# Patient Record
Sex: Female | Born: 1955 | Race: White | Hispanic: No | Marital: Married | Smoking: Never smoker
Health system: Southern US, Community
[De-identification: ages and names within clinical notes are randomized; demographics above are authoritative.]

## PROBLEM LIST (undated history)

## (undated) DIAGNOSIS — Z9225 Personal history of immunosupression therapy: Secondary | ICD-10-CM

## (undated) DIAGNOSIS — R0609 Other forms of dyspnea: Secondary | ICD-10-CM

## (undated) DIAGNOSIS — M2011 Hallux valgus (acquired), right foot: Secondary | ICD-10-CM

## (undated) DIAGNOSIS — E559 Vitamin D deficiency, unspecified: Secondary | ICD-10-CM

## (undated) DIAGNOSIS — I10 Essential (primary) hypertension: Secondary | ICD-10-CM

## (undated) DIAGNOSIS — M722 Plantar fascial fibromatosis: Secondary | ICD-10-CM

## (undated) DIAGNOSIS — R0789 Other chest pain: Secondary | ICD-10-CM

## (undated) DIAGNOSIS — F419 Anxiety disorder, unspecified: Secondary | ICD-10-CM

## (undated) DIAGNOSIS — I493 Ventricular premature depolarization: Secondary | ICD-10-CM

## (undated) DIAGNOSIS — M2012 Hallux valgus (acquired), left foot: Secondary | ICD-10-CM

## (undated) DIAGNOSIS — N200 Calculus of kidney: Secondary | ICD-10-CM

## (undated) DIAGNOSIS — M069 Rheumatoid arthritis, unspecified: Secondary | ICD-10-CM

## (undated) DIAGNOSIS — M1990 Unspecified osteoarthritis, unspecified site: Secondary | ICD-10-CM

## (undated) DIAGNOSIS — R0600 Dyspnea, unspecified: Secondary | ICD-10-CM

## (undated) DIAGNOSIS — M19072 Primary osteoarthritis, left ankle and foot: Secondary | ICD-10-CM

## (undated) DIAGNOSIS — E785 Hyperlipidemia, unspecified: Secondary | ICD-10-CM

## (undated) DIAGNOSIS — M1712 Unilateral primary osteoarthritis, left knee: Secondary | ICD-10-CM

## (undated) HISTORY — DX: Calculus of kidney: N20.0

## (undated) HISTORY — DX: Plantar fascial fibromatosis: M72.2

## (undated) HISTORY — DX: Vitamin D deficiency, unspecified: E55.9

## (undated) HISTORY — PX: GALLBLADDER SURGERY: SHX652

## (undated) HISTORY — DX: Primary osteoarthritis, left ankle and foot: M19.072

## (undated) HISTORY — DX: Unspecified osteoarthritis, unspecified site: M19.90

## (undated) HISTORY — DX: Ventricular premature depolarization: I49.3

## (undated) HISTORY — PX: OTHER SURGICAL HISTORY: SHX169

## (undated) HISTORY — DX: Unilateral primary osteoarthritis, left knee: M17.12

## (undated) HISTORY — PX: CATARACT EXTRACTION, BILATERAL: SHX1313

---

## 2004-12-02 HISTORY — PX: CHOLECYSTECTOMY: SHX55

## 2004-12-02 HISTORY — PX: CARDIAC CATHETERIZATION: SHX172

## 2018-11-10 ENCOUNTER — Telehealth: Payer: Self-pay

## 2018-11-10 NOTE — Telephone Encounter (Signed)
-----   Message from Wellington Hampshire, MD sent at 11/10/2018  9:43 AM EST ----- Regarding: RE: New patient  I can see her next tuesday at 1:40.   ----- Message ----- From: Clarisse Gouge Sent: 11/10/2018   8:59 AM EST To: Wellington Hampshire, MD Subject: New patient                                    Jeno Pasti's wife wants an asap new patient appt with you only declines to go to ED as they are  incompetent (she chose more colorful words) for chest discomfort and interim tach 110's .  She says you will work her in :-) and declined February appt.  Any suggestions?  Thanks   [employer]

## 2018-11-10 NOTE — Telephone Encounter (Signed)
lmov to confirm appt time.    

## 2018-11-17 ENCOUNTER — Ambulatory Visit: Payer: Self-pay | Admitting: Cardiovascular Disease

## 2018-11-23 ENCOUNTER — Encounter: Payer: Self-pay | Admitting: Emergency Medicine

## 2018-11-23 ENCOUNTER — Other Ambulatory Visit: Payer: Self-pay

## 2018-11-23 ENCOUNTER — Emergency Department: Payer: Self-pay

## 2018-11-23 ENCOUNTER — Emergency Department
Admission: EM | Admit: 2018-11-23 | Discharge: 2018-11-23 | Disposition: A | Discharge status: Home / Self Care | Payer: Self-pay | Attending: Emergency Medicine | Admitting: Emergency Medicine

## 2018-11-23 DIAGNOSIS — R110 Nausea: Secondary | ICD-10-CM | POA: Insufficient documentation

## 2018-11-23 DIAGNOSIS — Y92512 Supermarket, store or market as the place of occurrence of the external cause: Secondary | ICD-10-CM | POA: Insufficient documentation

## 2018-11-23 DIAGNOSIS — Y9301 Activity, walking, marching and hiking: Secondary | ICD-10-CM | POA: Insufficient documentation

## 2018-11-23 DIAGNOSIS — I10 Essential (primary) hypertension: Secondary | ICD-10-CM | POA: Insufficient documentation

## 2018-11-23 DIAGNOSIS — S20212A Contusion of left front wall of thorax, initial encounter: Secondary | ICD-10-CM | POA: Insufficient documentation

## 2018-11-23 DIAGNOSIS — W19XXXA Unspecified fall, initial encounter: Secondary | ICD-10-CM

## 2018-11-23 DIAGNOSIS — Z79899 Other long term (current) drug therapy: Secondary | ICD-10-CM | POA: Insufficient documentation

## 2018-11-23 DIAGNOSIS — S7002XA Contusion of left hip, initial encounter: Secondary | ICD-10-CM | POA: Insufficient documentation

## 2018-11-23 DIAGNOSIS — Y998 Other external cause status: Secondary | ICD-10-CM | POA: Insufficient documentation

## 2018-11-23 DIAGNOSIS — R51 Headache: Secondary | ICD-10-CM | POA: Insufficient documentation

## 2018-11-23 DIAGNOSIS — S8002XA Contusion of left knee, initial encounter: Secondary | ICD-10-CM | POA: Insufficient documentation

## 2018-11-23 DIAGNOSIS — W01198A Fall on same level from slipping, tripping and stumbling with subsequent striking against other object, initial encounter: Secondary | ICD-10-CM | POA: Insufficient documentation

## 2018-11-23 HISTORY — DX: Essential (primary) hypertension: I10

## 2018-11-23 MED ORDER — MELOXICAM 15 MG PO TABS: 15.0000 mg | ORAL_TABLET | Freq: Every day | ORAL | 0 refills | Status: DC

## 2018-11-23 MED ORDER — TRAMADOL HCL 50 MG PO TABS: 50.0000 mg | ORAL_TABLET | Freq: Four times a day (QID) | ORAL | 0 refills | Status: DC | PRN

## 2018-11-23 MED ORDER — METHOCARBAMOL 500 MG PO TABS: 500.0000 mg | ORAL_TABLET | Freq: Four times a day (QID) | ORAL | 0 refills | Status: DC

## 2018-11-23 NOTE — ED Triage Notes (Signed)
Pt here with c/o pain on her left back, left leg and left arm. States she fell when walking into [employer], slipped on the wet floor as she was walking in, walked to triage with limp, appears in NAD.

## 2018-11-23 NOTE — ED Triage Notes (Signed)
Pain worsens with movement.

## 2018-11-23 NOTE — ED Provider Notes (Signed)
[facility] Emergency Department Provider Note  ____________________________________________  Time seen: Approximately 6:00 PM  I have reviewed the triage vital signs and the nursing notes.   HISTORY  Chief Complaint No chief complaint on file.    HPI Cindy Stein is a 62 y.o. female who presents the emergency department with multiple complaints after slipping and falling.  Patient was at [facility], transition from the outside which was red, into concrete.  Patient slipped and fell landing on her left side.  Patient reports that she did strike her head but did not lose consciousness.  Initially, she did not think "anything was wrong."  However, she has developed a headache, some nausea, left rib pain, left hip pain, left knee pain.  No medications    Past Medical History:  Diagnosis Date  . Hypertension     There are no active problems to display for this patient.   History reviewed. No pertinent surgical history.  Prior to Admission medications   Medication Sig Start Date End Date Taking? Authorizing Provider  carvedilol (COREG) 6.25 MG tablet Take 6.25 mg by mouth 2 (two) times daily with a meal.   Yes [provider]  olmesartan (BENICAR) 20 MG tablet Take 20 mg by mouth daily.   Yes [provider]  meloxicam (MOBIC) 15 MG tablet Take 1 tablet (15 mg total) by mouth daily. 11/23/18   , Charline Bills, PA-C  methocarbamol (ROBAXIN) 500 MG tablet Take 1 tablet (500 mg total) by mouth 4 (four) times daily. 11/23/18   , Charline Bills, PA-C  traMADol (ULTRAM) 50 MG tablet Take 1 tablet (50 mg total) by mouth every 6 (six) hours as needed. 11/23/18   , Charline Bills, PA-C    Allergies Patient has no known allergies.  No family history on file.  Social History Social History   Tobacco Use  . Smoking status: Never Smoker  . Smokeless tobacco: Never Used  Substance Use Topics  . Alcohol use: Not Currently  .  Drug use: Not on file     Review of Systems  Constitutional: No fever/chills Eyes: No visual changes.  Cardiovascular: no chest pain. Respiratory: no cough. No SOB. Gastrointestinal: No abdominal pain.  Positive nausea, no vomiting.  No diarrhea.  No constipation. Musculoskeletal: Positive for left rib pain, mid back pain, left hip pain, left knee pain Skin: Negative for rash, abrasions, lacerations, ecchymosis. Neurological: Negative for headaches, focal weakness or numbness. 10-point ROS otherwise negative.  ____________________________________________   PHYSICAL EXAM:  VITAL SIGNS: ED Triage Vitals  Enc Vitals Group     BP 11/23/18 1747 134/71     Pulse Rate 11/23/18 1747 92     Resp 11/23/18 1747 18     Temp 11/23/18 1747 (!) 97.4 F (36.3 C)     Temp Source 11/23/18 1747 Oral     SpO2 11/23/18 1747 96 %     Weight 11/23/18 1750 195 lb (88.5 kg)     Height 11/23/18 1750 5\' 2"  (1.575 m)     Head Circumference --      Peak Flow --      Pain Score 11/23/18 1749 7     Pain Loc --      Pain Edu? --      Excl. in [place]? --      Constitutional: Alert and oriented. Well appearing and in no acute distress. Eyes: Conjunctivae are normal. PERRL. EOMI. Head: Atraumatic.  Patient is nontender to palpation of the osseous structures  of the skull and face.  No palpable abnormality or crepitus.  No battle signs, raccoon eyes, serosanguineous fluid drainage. ENT:      Ears:       Nose: No congestion/rhinnorhea.      Mouth/Throat: Mucous membranes are moist.  Neck: No stridor.  No cervical spine tenderness to palpation.  Cardiovascular: Normal rate, regular rhythm. Normal S1 and S2.  Good peripheral circulation. Respiratory: Normal respiratory effort without tachypnea or retractions. Lungs CTAB. Good air entry to the bases with no decreased or absent breath sounds. Musculoskeletal: Full range of motion to all extremities. No gross deformities appreciated.  Visualization of the left  ribs reveals no acute signs of trauma.  Equal chest rise and fall.  No paradoxical chest wall movement.  Patient is diffusely tender to palpation throughout the left ribs on ribs 6 through 12.  No palpable abnormality or deficit.  Good underlying breath sounds bilaterally.  Visualization of the lumbar spine reveals no signs of trauma.  Mild tenderness to palpation of the lumbar spine both midline and left-sided paraspinal muscle group.  No palpable abnormality or step-off.  Patient is tender to palpation over the left greater trochanteric region with no palpable abnormality.  No other tenderness to palpation over the left hip.  Patient is able to bear weight on the left hip.  No shortening or rotation.  Examination of the left knee reveals no gross signs of trauma.  Full range of motion to left knee.  Patient is tender to palpation over the lateral aspect of the knee with no palpable abnormality or deficit.  Examination of the remaining left lower extremity reveals no visible signs of trauma.  No other tenderness to palpation.  Dorsalis pedis pulse intact bilateral lower extremities.  Sensation intact and equal bilateral lower extremities. Neurologic:  Normal speech and language. No gross focal neurologic deficits are appreciated.  Skin:  Skin is warm, dry and intact. No rash noted.  Cranial nerves II through XII grossly intact.  Negative Romberg's and pronator drift. Psychiatric: Mood and affect are normal. Speech and behavior are normal. Patient exhibits appropriate insight and judgement.   ____________________________________________   LABS (all labs ordered are listed, but only abnormal results are displayed)  Labs Reviewed - No data to display ____________________________________________  EKG   ____________________________________________  RADIOLOGY I personally viewed and evaluated these images as part of my medical decision making, as well as reviewing the written report by the  radiologist.  Dg Ribs Unilateral W/chest Left  Result Date: 11/23/2018 CLINICAL DATA:  Patient slipped on a wet floor at [employer] at approximately 2 p.m. and has left-sided rib and axillary pain radiating posteriorly. EXAM: LEFT RIBS AND CHEST - 3+ VIEW COMPARISON:  None. FINDINGS: The heart and mediastinal contours are within normal limits. No acute pulmonary consolidation, effusion or pneumothorax. No acute displaced rib fracture or suspicious osseous lesions. Mild degenerative change of the included thoracolumbar spine. IMPRESSION: No acute cardiopulmonary abnormality. No acute displaced left rib fracture. Electronically Signed   By: Ashley Royalty M.D.   On: 11/23/2018 19:22   Dg Lumbar Spine 2-3 Views  Result Date: 11/23/2018 CLINICAL DATA:  Low back pain after slip and fall today at 2 p.m. at [place]. EXAM: LUMBAR SPINE - 2-3 VIEW COMPARISON:  None. FINDINGS: There are 5 non ribbed lumbar type vertebral bodies in maintained lumbar lordosis. No spondylolysis nor spondylolisthesis. Joint space narrowing sclerosis compatible with degenerative facet arthropathy is seen from L3 through S1 greatest from L4 through  S1. Osteophytes are noted off the endplates of the lumbar vertebrae. No acute appearing fracture is identified. Disc spaces are maintained. No pelvic diastasis is seen. Mild-to-moderate joint space narrowing of the included hips. Cholecystectomy clips are present right upper quadrant of the abdomen. 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 No acute osseous abnormality. If there is pain out of proportion to radiographic findings, CT may help identify radiographically occult fractures. Electronically Signed   By: Ashley Royalty M.D.   On: 11/23/2018 19:30   Ct Head Wo Contrast  Result Date: 11/23/2018 CLINICAL DATA:  Fall. Headache. EXAM: CT HEAD WITHOUT CONTRAST CT CERVICAL SPINE WITHOUT CONTRAST TECHNIQUE: Multidetector CT imaging of the head and cervical spine was  performed following the standard protocol without intravenous contrast. Multiplanar CT image reconstructions of the cervical spine were also generated. COMPARISON:  None. FINDINGS: CT HEAD FINDINGS Brain: There is no mass, hemorrhage or extra-axial collection. The size and configuration of the ventricles and extra-axial CSF spaces are normal. The brain parenchyma is normal, without evidence of acute or chronic infarction. Vascular: No abnormal hyperdensity of the major intracranial arteries or dural venous sinuses. No intracranial atherosclerosis. Skull: The visualized skull base, calvarium and extracranial soft tissues are normal. Sinuses/Orbits: No fluid levels or advanced mucosal thickening of the visualized paranasal sinuses. No mastoid or middle ear effusion. The orbits are normal. CT CERVICAL SPINE FINDINGS Alignment: No static subluxation. Facets are aligned. Occipital condyles are normally positioned. Skull base and vertebrae: No acute fracture. Soft tissues and spinal canal: No prevertebral fluid or swelling. No visible canal hematoma. Disc levels: No advanced spinal canal or neural foraminal stenosis. Upper chest: No pneumothorax, pulmonary nodule or pleural effusion. Other: Normal visualized paraspinal cervical soft tissues. IMPRESSION: 1. Normal head CT. 2. No acute fracture or static subluxation of the cervical spine. Electronically Signed   By: Ulyses Jarred M.D.   On: 11/23/2018 19:15   Ct Cervical Spine Wo Contrast  Result Date: 11/23/2018 CLINICAL DATA:  Fall. Headache. EXAM: CT HEAD WITHOUT CONTRAST CT CERVICAL SPINE WITHOUT CONTRAST TECHNIQUE: Multidetector CT imaging of the head and cervical spine was performed following the standard protocol without intravenous contrast. Multiplanar CT image reconstructions of the cervical spine were also generated. COMPARISON:  None. FINDINGS: CT HEAD FINDINGS Brain: There is no mass, hemorrhage or extra-axial collection. The size and configuration of the  ventricles and extra-axial CSF spaces are normal. The brain parenchyma is normal, without evidence of acute or chronic infarction. Vascular: No abnormal hyperdensity of the major intracranial arteries or dural venous sinuses. No intracranial atherosclerosis. Skull: The visualized skull base, calvarium and extracranial soft tissues are normal. Sinuses/Orbits: No fluid levels or advanced mucosal thickening of the visualized paranasal sinuses. No mastoid or middle ear effusion. The orbits are normal. CT CERVICAL SPINE FINDINGS Alignment: No static subluxation. Facets are aligned. Occipital condyles are normally positioned. Skull base and vertebrae: No acute fracture. Soft tissues and spinal canal: No prevertebral fluid or swelling. No visible canal hematoma. Disc levels: No advanced spinal canal or neural foraminal stenosis. Upper chest: No pneumothorax, pulmonary nodule or pleural effusion. Other: Normal visualized paraspinal cervical soft tissues. IMPRESSION: 1. Normal head CT. 2. No acute fracture or static subluxation of the cervical spine. Electronically Signed   By: Ulyses Jarred M.D.   On: 11/23/2018 19:15   Dg Knee Complete 4 Views Left  Result Date: 11/23/2018 CLINICAL DATA:  Patient slipped and fell on a wet floor at 2 p.m. today [employer]. Pain. EXAM:  LEFT KNEE - COMPLETE 4+ VIEW COMPARISON:  None. FINDINGS: Mild-to-moderate joint space narrowing of the femorotibial compartment with spurring off the medial femoral condyle and tibial plateau. No joint effusion or fracture. No joint dislocation. Soft tissues are unremarkable. IMPRESSION: Osteoarthritis of the femorotibial compartment. No acute osseous abnormality. Electronically Signed   By: Ashley Royalty M.D.   On: 11/23/2018 19:27   Dg Hip Unilat W Or Wo Pelvis 2-3 Views Left  Result Date: 11/23/2018 CLINICAL DATA:  Left hip pain after slip and fall at [employer] at 2 p.m. today. EXAM: DG HIP (WITH OR WITHOUT PELVIS) 2-3V LEFT COMPARISON:  None.  FINDINGS: There is no evidence of hip fracture or dislocation. The included lower lumbar spine demonstrates mild facet arthrosis L4-5 and L5-S1. Bony pelvis appears intact. There is no pelvic diastasis. There is bilateral mild to moderate joint space narrowing of both hips without acute fracture joint dislocation. Soft tissues are unremarkable. IMPRESSION: Mild-to-moderate degenerative joint space narrowing of both hips. Lower lumbar facet arthrosis. No acute osseous abnormality. Electronically Signed   By: Ashley Royalty M.D.   On: 11/23/2018 19:24    ____________________________________________    PROCEDURES  Procedure(s) performed:    Procedures    Medications - No data to display   ____________________________________________   INITIAL IMPRESSION / ASSESSMENT AND PLAN / ED COURSE  Pertinent labs & imaging results that were available during my care of the patient were reviewed by me and considered in my medical decision making (see chart for details).  Review of the [place] CSRS was performed in accordance of the [place] prior to dispensing any controlled drugs.      Patient's diagnosis is consistent with fall resulting in multiple contusions.  Patient presents emergency department after slipping and falling on a wet floor.  Patient did not hit her head but did not lose consciousness.  Overall, exam was reassuring.  Given nature of injury with multiple complaints, patient was evaluated with CT scan of the head and neck, x-rays of the ribs, lower back, left hip, left knee.  Imaging returns with reassuring results with no indication of acute osseous abnormality or intracranial hemorrhage.  Patient will be prescribed a short course of anti-inflammatory, muscle relaxer, Ultram.  Follow-up with primary care as needed..  Patient is given ED precautions to return to the ED for any worsening or new symptoms.     ____________________________________________  FINAL CLINICAL IMPRESSION(S) / ED  DIAGNOSES  Final diagnoses:  Fall, initial encounter  Contusion of rib on left side, initial encounter  Contusion of left hip, initial encounter  Contusion of left knee, initial encounter      NEW MEDICATIONS STARTED DURING THIS VISIT:  ED Discharge Orders         Ordered    meloxicam (MOBIC) 15 MG tablet  Daily     11/23/18 1958    methocarbamol (ROBAXIN) 500 MG tablet  4 times daily     11/23/18 1958    traMADol (ULTRAM) 50 MG tablet  Every 6 hours PRN     11/23/18 1958              This chart was dictated using voice recognition software/Dragon. Despite best efforts to proofread, errors can occur which can change the meaning. Any change was purely unintentional.    Darletta Moll, PA-C 11/23/18 1958    Nance Pear, MD 11/23/18 2004

## 2018-11-23 NOTE — ED Notes (Signed)
See triage note   States she slipped on wet floor at [facility]  Having pain to left arm,leg and lower back  States this happened around 2 pm  She is getting more sore

## 2018-12-10 ENCOUNTER — Encounter: Payer: Self-pay | Admitting: Obstetrics and Gynecology

## 2018-12-10 ENCOUNTER — Ambulatory Visit: Payer: Self-pay | Admitting: Obstetrics and Gynecology

## 2018-12-10 VITALS — BP 135/88 | HR 84 | Ht 62.0 in | Wt 205.9 lb

## 2018-12-10 DIAGNOSIS — N8189 Other female genital prolapse: Secondary | ICD-10-CM

## 2018-12-10 DIAGNOSIS — N950 Postmenopausal bleeding: Secondary | ICD-10-CM

## 2018-12-10 NOTE — Progress Notes (Signed)
Pt is present today due to having abnormal vaginal bleeding. Pt stated that she noticed the bleeding last night when she was taking her bath. No other issues.

## 2018-12-10 NOTE — Progress Notes (Signed)
HPI:      [name] is a 63 y.o. G1P1001 who LMP was No LMP recorded. Patient is postmenopausal.  Subjective:   She presents today with complaint of a 1 day history of vaginal bleeding.  Patient has been in menopause for many years without bleeding this is the first episode of bleeding.  She reports that after her shower she noticed significant bleeding from the vagina.  This bleeding has now resolved today but certainly concerned her when it happened. She does describe a remote history of some type of "cyst" that was being followed by ultrasound 2 years ago.  She reports no issues with her Pap smear. She does report a history of her last doctor telling her that her "bladder fell".    Hx: The following portions of the patient's history were reviewed and updated as appropriate:             She  has a past medical history of Hypertension. She does not have a problem list on file. She  has a past surgical history that includes Gallbladder surgery and carpel tunnel. Her family history includes Asthma in her father and mother; Heart failure in her father. She  reports that she has never smoked. She has never used smokeless tobacco. She reports current alcohol use. She reports that she does not use drugs. She has a current medication list which includes the following prescription(s): carvedilol, meloxicam, olmesartan, olmesartan-hydrochlorothiazide, and turmeric curcumin. She has No Known Allergies.       Review of Systems:  Review of Systems  Constitutional: Denied constitutional symptoms, night sweats, recent illness, fatigue, fever, insomnia and weight loss.  Eyes: Denied eye symptoms, eye pain, photophobia, vision change and visual disturbance.  Ears/Nose/Throat/Neck: Denied ear, nose, throat or neck symptoms, hearing loss, nasal discharge, sinus congestion and sore throat.  Cardiovascular: Denied cardiovascular symptoms, arrhythmia, chest pain/pressure, edema, exercise intolerance,  orthopnea and palpitations.  Respiratory: Denied pulmonary symptoms, asthma, pleuritic pain, productive sputum, cough, dyspnea and wheezing.  Gastrointestinal: Denied, gastro-esophageal reflux, melena, nausea and vomiting.  Genitourinary: See HPI for additional information.  Musculoskeletal: Denied musculoskeletal symptoms, stiffness, swelling, muscle weakness and myalgia.  Dermatologic: Denied dermatology symptoms, rash and scar.  Neurologic: Denied neurology symptoms, dizziness, headache, neck pain and syncope.  Psychiatric: Denied psychiatric symptoms, anxiety and depression.  Endocrine: Denied endocrine symptoms including hot flashes and night sweats.   Meds:   Current Outpatient Medications on File Prior to Visit  Medication Sig Dispense Refill  . carvedilol (COREG) 3.125 MG tablet Take 3.125 mg by mouth 2 (two) times daily with a meal.    . meloxicam (MOBIC) 15 MG tablet Take 1 tablet (15 mg total) by mouth daily. 30 tablet 0  . olmesartan (BENICAR) 20 MG tablet Take 20 mg by mouth daily.    Marland Kitchen olmesartan-hydrochlorothiazide (BENICAR HCT) 20-12.5 MG tablet Take 1 tablet by mouth daily.    . Turmeric Curcumin 500 MG CAPS Take by mouth.     No current facility-administered medications on file prior to visit.     Objective:     Vitals:   12/10/18 1434  BP: 135/88  Pulse: 84              Physical examination   Pelvic:   Vulva: Normal appearance.  No lesions.  Vagina: No lesions or abnormalities noted.  Support:  Third-degree uterine descensus  Urethra No masses tenderness or scarring.  Meatus Normal size without lesions or prolapse.  Cervix:  Irregular appearing "  scratch" noted on the left lateral aspect of the cervix.  Anus: Normal exam.  No lesions.  Perineum: Normal exam.  No lesions.        Bimanual   Uterus: Normal size.  Non-tender.  Mobile.  AV.  Adnexae: No masses.  Non-tender to palpation.  Cul-de-sac: Negative for abnormality.     Assessment:     G1P1001 There are no active problems to display for this patient.    1. Post-menopause bleeding   2. Pelvic relaxation     Bleeding likely secondary to scratch on her cervix.  This possibly happened because of her prolapse and the cervix became scratched while showering. (Hypothesis)   Plan:            1.  Expectant management of postmenopausal bleeding.  If patient has any further episodes of bleeding she is to contact us immediately.  2.  Pelvic ultrasound for endometrial thickness and to follow-up the "cyst".  Patient would like to delay this ultrasound until March when she gets Medicare. Orders Orders Placed This Encounter  Procedures  . US PELVIS (TRANSABDOMINAL ONLY)  . US PELVIS TRANSVANGINAL NON-OB (TV ONLY)    No orders of the defined types were placed in this encounter.     F/U  Return in about 3 months (around 03/11/2019). I spent 32 minutes involved in the care of this patient of which greater than 50% was spent discussing postmenopausal bleeding, uterine descensus, work-up for postmenopausal bleeding possible causes, ovarian cyst versus uterine cysts.  All questions answered.  Cindy Stein, M.D. 12/10/2018 3:37 PM

## 2019-02-08 ENCOUNTER — Ambulatory Visit: Payer: Self-pay

## 2019-02-09 ENCOUNTER — Other Ambulatory Visit: Payer: Self-pay

## 2019-02-09 ENCOUNTER — Encounter: Payer: Self-pay | Admitting: Obstetrics and Gynecology

## 2019-02-16 ENCOUNTER — Ambulatory Visit: Payer: Self-pay | Admitting: Cardiovascular Disease

## 2019-02-24 ENCOUNTER — Other Ambulatory Visit: Payer: Self-pay

## 2019-05-04 ENCOUNTER — Telehealth: Payer: Self-pay

## 2019-05-04 NOTE — Telephone Encounter (Signed)
[facility] reference the Covid pre-screen.

## 2019-05-07 ENCOUNTER — Ambulatory Visit: Payer: Self-pay | Admitting: Cardiovascular Disease

## 2019-06-02 ENCOUNTER — Telehealth: Payer: Self-pay | Admitting: Cardiovascular Disease

## 2019-06-02 NOTE — Telephone Encounter (Signed)

## 2019-06-03 ENCOUNTER — Ambulatory Visit (INDEPENDENT_AMBULATORY_CARE_PROVIDER_SITE_OTHER): Payer: Medicare Other | Admitting: Cardiovascular Disease

## 2019-06-03 ENCOUNTER — Other Ambulatory Visit: Payer: Self-pay

## 2019-06-03 ENCOUNTER — Other Ambulatory Visit: Payer: Medicare Other

## 2019-06-03 ENCOUNTER — Telehealth: Payer: Self-pay

## 2019-06-03 ENCOUNTER — Encounter: Payer: Self-pay | Admitting: Cardiovascular Disease

## 2019-06-03 VITALS — BP 118/78 | HR 99 | Temp 97.4°F | Ht 64.0 in | Wt 201.0 lb

## 2019-06-03 DIAGNOSIS — I10 Essential (primary) hypertension: Secondary | ICD-10-CM

## 2019-06-03 DIAGNOSIS — I200 Unstable angina: Secondary | ICD-10-CM

## 2019-06-03 DIAGNOSIS — Z01812 Encounter for preprocedural laboratory examination: Secondary | ICD-10-CM

## 2019-06-03 MED ORDER — ASPIRIN EC 81 MG PO TBEC: 81.0000 mg | DELAYED_RELEASE_TABLET | Freq: Every day | ORAL | Status: DC

## 2019-06-03 NOTE — H&P (View-Only) (Signed)
Cardiology Office Note   Date:  06/03/2019   ID:  Cindy Stein, DOB [birth year], MRN [id number]  PCP:  Lorelee Market, MD  Cardiologist:   [name], MD   Chief Complaint  Patient presents with  . other    Self ref for chest discomfort. Pt. c/o chest pain that comes and goes with occas. shortness of breath with over exertion. Pt. has a past cardiac Hx. while living in [place] in 2006.  Meds reviewed by the pt. verbally.       History of Present Illness: Cindy Stein is a 63 y.o. female who presents for urgent evaluation of chest pain.  She has known history of essential hypertension, hyperlipidemia and obesity.  She also has strong family history of coronary artery disease affecting almost every family member.  Her mother died of myocardial infarction.  Her father had myocardial infarction at 46 and sister had myocardial infarction at 65. The patient woke up on Tuesday with substernal chest tightness which lasted for about 10 minutes.  The symptoms subsided without intervention.  However, over the last 2 days she had intermittent substernal chest tightness mostly with exertion associated with significant shortness of breath.  She had significant decline in her functional capacity due to these new symptoms.  She is currently chest pain-free.  She does have some symptoms of GERD which are chronic but her current symptoms are different. She reports having cardiac work-up in [place] in 2006 which included an echocardiogram, stress test and ultimately a cardiac catheterization which showed no obstructive coronary artery disease.  These records are not available but they were requested. She is not a smoker.    Past Medical History:  Diagnosis Date  . Arthritis of left ankle   . Arthritis of left knee   . Hypertension   . Plantar fascial fibromatosis   . Unspecified osteoarthritis, unspecified site   . Vitamin D deficiency     Past Surgical History:  Procedure  Laterality Date  . carpel tunnel    . GALLBLADDER SURGERY       Current Outpatient Medications  Medication Sig Dispense Refill  . carvedilol (COREG) 3.125 MG tablet Take 3.125 mg by mouth 2 (two) times daily with a meal.    . meloxicam (MOBIC) 15 MG tablet Take 1 tablet (15 mg total) by mouth daily. 30 tablet 0  . olmesartan-hydrochlorothiazide (BENICAR HCT) 20-12.5 MG tablet Take 1 tablet by mouth daily.    . traMADol (ULTRAM) 50 MG tablet Take by mouth every 6 (six) hours as needed.    . Turmeric Curcumin 500 MG CAPS Take by mouth.     No current facility-administered medications for this visit.     Allergies:   Patient has no known allergies.    Social History:  The patient  reports that she has never smoked. She has never used smokeless tobacco. She reports current alcohol use. She reports that she does not use drugs.   Family History:  The patient's family history includes Asthma in her father and mother; Heart failure in her father.    ROS:  Please see the history of present illness.   Otherwise, review of systems are positive for none.   All other systems are reviewed and negative.    PHYSICAL EXAM: VS:  BP 118/78 (BP Location: Right Arm, Patient Position: Sitting, Cuff Size: Normal)   Pulse 99   Temp (!) 97.4 F (36.3 C)   Ht 5\' 4"  (1.626 m)  Wt 201 lb (91.2 kg)   SpO2 99%   BMI 34.50 kg/m  , BMI Body mass index is 34.5 kg/m. GEN: Well nourished, well developed, in no acute distress  HEENT: normal  Neck: no JVD, carotid bruits, or masses Cardiac: RRR; no murmurs, rubs, or gallops,no edema .  Right radial pulses normal. Respiratory:  clear to auscultation bilaterally, normal work of breathing GI: soft, nontender, nondistended, + BS MS: no deformity or atrophy  Skin: warm and dry, no rash Neuro:  Strength and sensation are intact Psych: euthymic mood, full affect   EKG:  EKG is ordered today. The ekg ordered today demonstrates sinus bradycardia with no  significant ST or T wave changes.   Recent Labs: No results found for requested labs within last 8760 hours.    Lipid Panel No results found for: CHOL, TRIG, HDL, CHOLHDL, VLDL, LDLCALC, LDLDIRECT    Wt Readings from Last 3 Encounters:  06/03/19 201 lb (91.2 kg)  12/10/18 205 lb 14.4 oz (93.4 kg)  11/23/18 195 lb (88.5 kg)      PAD Screen 06/03/2019  Previous PAD dx? No  Previous surgical procedure? No  Pain with walking? No  Feet/toe relief with dangling? No  Painful, non-healing ulcers? No  Extremities discolored? No      ASSESSMENT AND PLAN:  1.  Unstable angina: The patient's symptoms are worrisome for unstable angina and she has multiple risk factors for coronary artery disease.  I advised her to start taking aspirin 81 mg once daily.  Given her symptoms, I have advised her to proceed with urgent left heart catheterization and possible PCI.  I discussed the procedure in details as well as risks and benefits.  After we made the arrangement for cardiac catheterization and ordered all the pretesting required, the patient decided to cancel because she did not want to go to any hospital due to COVID-19 and did not want to have testing done. We instructed her to notify us with the progress of her symptoms and seek treatment in the ED if symptoms worsen.  We will give the patient the option of Lexiscan Myoview.  2.  Essential hypertension: Blood pressure is controlled.  3.  Hyperlipidemia: He had lipid profile done in January with her primary care physician which showed a total cholesterol of 165, triglyceride of 164 and an LDL of 72.  If cardiac catheterization shows significant coronary artery disease, treatment with a statin would be indicated.    Disposition:   FU with me in 1 month  Signed,  [name], MD  06/03/2019 10:50 AM    [place]

## 2019-06-03 NOTE — Patient Instructions (Addendum)
Medication Instructions:  Your physician has recommended you make the following change in your medication:  START Aspirin 81mg  daily  If you need a refill on your cardiac medications before your next appointment, please call your pharmacy.   Lab work: Risk manager today.  You will need a COVID test prior to your procedure. Please drive through the [facility] testing site at the medical mall building today after your appointment. If you have labs (blood work) drawn today and your tests are completely normal, you will receive your results only by: Marland Kitchen MyChart Message (if you have MyChart) OR . A paper copy in the mail If you have any lab test that is abnormal or we need to change your treatment, we will call you to review the results.  Testing/Procedures: Your physician has requested that you have a cardiac catheterization. Cardiac catheterization is used to diagnose and/or treat various heart conditions. Doctors may recommend this procedure for a number of different reasons. The most common reason is to evaluate chest pain. Chest pain can be a symptom of coronary artery disease (CAD), and cardiac catheterization can show whether plaque is narrowing or blocking your heart's arteries. This procedure is also used to evaluate the valves, as well as measure the blood flow and oxygen levels in different parts of your heart. For further information please visit [email or web address]. Please follow instruction sheet, as given.    Follow-Up: At [facility], you and your health needs are our priority.  As part of our continuing mission to provide you with exceptional heart care, we have created designated Provider Care Teams.  These Care Teams include your primary Cardiologist (physician) and Advanced Practice Providers (APPs -  Physician Assistants and Nurse Practitioners) who all work together to provide you with the care you need, when you need it. You will need a follow up appointment in 4 weeks.   You may  see  Dr.Arida or one of the following Advanced Practice Providers on your designated Care Team:   Murray Hodgkins, NP Christell Faith, PA-C . Marrianne Mood, PA-C  Any Other Special Instructions Will Be Listed Below (If Applicable).  Please contact the office when you decide to proceed with the cardiac cath. (COVID-19 testing will be required)

## 2019-06-03 NOTE — Telephone Encounter (Signed)
Placed a f/u call to the pt with Dr.Arida's recommendation after she decided to cancel her cardiac cath.  After we made the arrangement for cardiac catheterization and ordered all the pretesting required, the patient decided to cancel because she did not want to go to any hospital due to COVID-19 and did not want to have testing done. We instructed her to notify us with the progress of her symptoms and seek treatment in the ED if symptoms worsen.  We will give the patient the option of Lexiscan Myoview.  Pt now sts that she would like to proceed with the procedure. Adv the pt that I will need to fwd the update to [place] and call her back with a date time and instructions. Pt adv to seek emergent care for worsening symptoms.  Pt verbalized understanding and apologized for the inconvenience.

## 2019-06-03 NOTE — Progress Notes (Signed)
Cardiology Office Note   Date:  06/03/2019   ID:  Cindy Stein, DOB February 06, 1956, MRN [id number]  PCP:  Lorelee Market, MD  Cardiologist:   [name], MD   Chief Complaint  Patient presents with  . other    Self ref for chest discomfort. Pt. c/o chest pain that comes and goes with occas. shortness of breath with over exertion. Pt. has a past cardiac Hx. while living in [place] in 2006.  Meds reviewed by the pt. verbally.       History of Present Illness: Cindy Stein is a 63 y.o. female who presents for urgent evaluation of chest pain.  She has known history of essential hypertension, hyperlipidemia and obesity.  She also has strong family history of coronary artery disease affecting almost every family member.  Her mother died of myocardial infarction.  Her father had myocardial infarction at 67 and sister had myocardial infarction at 25. The patient woke up on Tuesday with substernal chest tightness which lasted for about 10 minutes.  The symptoms subsided without intervention.  However, over the last 2 days she had intermittent substernal chest tightness mostly with exertion associated with significant shortness of breath.  She had significant decline in her functional capacity due to these new symptoms.  She is currently chest pain-free.  She does have some symptoms of GERD which are chronic but her current symptoms are different. She reports having cardiac work-up in [place] in 2006 which included an echocardiogram, stress test and ultimately a cardiac catheterization which showed no obstructive coronary artery disease.  These records are not available but they were requested. She is not a smoker.    Past Medical History:  Diagnosis Date  . Arthritis of left ankle   . Arthritis of left knee   . Hypertension   . Plantar fascial fibromatosis   . Unspecified osteoarthritis, unspecified site   . Vitamin D deficiency     Past Surgical History:  Procedure  Laterality Date  . carpel tunnel    . GALLBLADDER SURGERY       Current Outpatient Medications  Medication Sig Dispense Refill  . carvedilol (COREG) 3.125 MG tablet Take 3.125 mg by mouth 2 (two) times daily with a meal.    . meloxicam (MOBIC) 15 MG tablet Take 1 tablet (15 mg total) by mouth daily. 30 tablet 0  . olmesartan-hydrochlorothiazide (BENICAR HCT) 20-12.5 MG tablet Take 1 tablet by mouth daily.    . traMADol (ULTRAM) 50 MG tablet Take by mouth every 6 (six) hours as needed.    . Turmeric Curcumin 500 MG CAPS Take by mouth.     No current facility-administered medications for this visit.     Allergies:   Patient has no known allergies.    Social History:  The patient  reports that she has never smoked. She has never used smokeless tobacco. She reports current alcohol use. She reports that she does not use drugs.   Family History:  The patient's family history includes Asthma in her father and mother; Heart failure in her father.    ROS:  Please see the history of present illness.   Otherwise, review of systems are positive for none.   All other systems are reviewed and negative.    PHYSICAL EXAM: VS:  BP 118/78 (BP Location: Right Arm, Patient Position: Sitting, Cuff Size: Normal)   Pulse 99   Temp (!) 97.4 F (36.3 C)   Ht 5\' 4"  (1.626 m)  Wt 201 lb (91.2 kg)   SpO2 99%   BMI 34.50 kg/m  , BMI Body mass index is 34.5 kg/m. GEN: Well nourished, well developed, in no acute distress  HEENT: normal  Neck: no JVD, carotid bruits, or masses Cardiac: RRR; no murmurs, rubs, or gallops,no edema .  Right radial pulses normal. Respiratory:  clear to auscultation bilaterally, normal work of breathing GI: soft, nontender, nondistended, + BS MS: no deformity or atrophy  Skin: warm and dry, no rash Neuro:  Strength and sensation are intact Psych: euthymic mood, full affect   EKG:  EKG is ordered today. The ekg ordered today demonstrates sinus bradycardia with no  significant ST or T wave changes.   Recent Labs: No results found for requested labs within last 8760 hours.    Lipid Panel No results found for: CHOL, TRIG, HDL, CHOLHDL, VLDL, LDLCALC, LDLDIRECT    Wt Readings from Last 3 Encounters:  06/03/19 201 lb (91.2 kg)  12/10/18 205 lb 14.4 oz (93.4 kg)  11/23/18 195 lb (88.5 kg)      PAD Screen 06/03/2019  Previous PAD dx? No  Previous surgical procedure? No  Pain with walking? No  Feet/toe relief with dangling? No  Painful, non-healing ulcers? No  Extremities discolored? No      ASSESSMENT AND PLAN:  1.  Unstable angina: The patient's symptoms are worrisome for unstable angina and she has multiple risk factors for coronary artery disease.  I advised her to start taking aspirin 81 mg once daily.  Given her symptoms, I have advised her to proceed with urgent left heart catheterization and possible PCI.  I discussed the procedure in details as well as risks and benefits.  After we made the arrangement for cardiac catheterization and ordered all the pretesting required, the patient decided to cancel because she did not want to go to any hospital due to COVID-19 and did not want to have testing done. We instructed her to notify us with the progress of her symptoms and seek treatment in the ED if symptoms worsen.  We will give the patient the option of Lexiscan Myoview.  2.  Essential hypertension: Blood pressure is controlled.  3.  Hyperlipidemia: He had lipid profile done in January with her primary care physician which showed a total cholesterol of 165, triglyceride of 164 and an LDL of 72.  If cardiac catheterization shows significant coronary artery disease, treatment with a statin would be indicated.    Disposition:   FU with me in 1 month  Signed,  [name], MD  06/03/2019 10:50 AM    [place]

## 2019-06-04 LAB — BASIC METABOLIC PANEL
BUN/Creatinine Ratio: 26 (ref 12–28)
BUN: 22 mg/dL (ref 8–27)
CO2: 22 mmol/L (ref 20–29)
Calcium: 9.8 mg/dL (ref 8.7–10.3)
Chloride: 103 mmol/L (ref 96–106)
Creatinine, Ser: 0.84 mg/dL (ref 0.57–1.00)
GFR calc Af Amer: 84 mL/min/{1.73_m2} (ref >59)
GFR calc non Af Amer: 73 mL/min/{1.73_m2} (ref >59)
Glucose: 98 mg/dL (ref 65–99)
Potassium: 4.1 mmol/L (ref 3.5–5.2)
Sodium: 139 mmol/L (ref 134–144)

## 2019-06-04 LAB — CBC WITH DIFFERENTIAL/PLATELET
Basophils Absolute: 0 10*3/uL (ref 0.0–0.2)
Basos: 1 %
EOS (ABSOLUTE): 0.2 10*3/uL (ref 0.0–0.4)
Eos: 3 %
Hematocrit: 43 % (ref 34.0–46.6)
Hemoglobin: 14.6 g/dL (ref 11.1–15.9)
Immature Grans (Abs): 0 10*3/uL (ref 0.0–0.1)
Immature Granulocytes: 0 %
Lymphocytes Absolute: 2.5 10*3/uL (ref 0.7–3.1)
Lymphs: 38 %
MCH: 30 pg (ref 26.6–33.0)
MCHC: 34 g/dL (ref 31.5–35.7)
MCV: 89 fL (ref 79–97)
Monocytes Absolute: 0.6 10*3/uL (ref 0.1–0.9)
Monocytes: 9 %
Neutrophils Absolute: 3.3 10*3/uL (ref 1.4–7.0)
Neutrophils: 49 %
Platelets: 211 10*3/uL (ref 150–450)
RBC: 4.86 x10E6/uL (ref 3.77–5.28)
RDW: 12.7 % (ref 11.7–15.4)
WBC: 6.7 10*3/uL (ref 3.4–10.8)

## 2019-06-04 NOTE — Telephone Encounter (Signed)
Not able to do on Monday unless she gets her COVID test on time. I can do at [facility] on July 8 or 10 th.  I can do at [facility] on July 13.

## 2019-06-07 ENCOUNTER — Ambulatory Visit: Admit: 2019-06-07 | Payer: Medicare Other | Admitting: Cardiovascular Disease

## 2019-06-07 SURGERY — LEFT HEART CATH AND CORONARY ANGIOGRAPHY
Anesthesia: Moderate Sedation

## 2019-06-08 NOTE — Telephone Encounter (Signed)
Spoke with the pt. She prefers to have her cardiac cath @ [facility]. Adv her that I will call back with a time for the procedure and the instruction for the procedure and COVID testing.   Pt cath scheduled for 06/14/19 @ 9:30am with Dr.Arida COVID test scheduled for 06/10/19 @ 12pm.

## 2019-06-09 NOTE — Telephone Encounter (Signed)
Spoke with the pt and made her aware that her cardiac cath is scheduled with Dr.Arida in 06/14/19 @ 9:30am. Pt will have her COVID test performed at the [facility] drive up on 0/2/28 @ 12pm. Pt had her pre-procedure labs when she was seen in the office on 06/03/19. Pt made aware of pre-procedure instructions with verbalized understanding. -NPO after midnight -Morning meds including asa with just a sip of water -Arrive to [facility] 1 hoor prior to the procedure -Pt will need someone to driver her home. Pt was given written instructions when she was seen in the office. Pt has no questions at this time. Adv the pt to contact the office if any questions or concerns.

## 2019-06-10 ENCOUNTER — Other Ambulatory Visit: Payer: Self-pay

## 2019-06-10 ENCOUNTER — Other Ambulatory Visit
Admission: RE | Admit: 2019-06-10 | Discharge: 2019-06-10 | Disposition: A | Discharge status: Home / Self Care | Payer: Medicare Other | Source: Ambulatory Visit | Attending: Cardiovascular Disease | Admitting: Cardiovascular Disease

## 2019-06-10 DIAGNOSIS — Z01812 Encounter for preprocedural laboratory examination: Secondary | ICD-10-CM | POA: Diagnosis present

## 2019-06-10 DIAGNOSIS — Z1159 Encounter for screening for other viral diseases: Secondary | ICD-10-CM | POA: Diagnosis not present

## 2019-06-11 LAB — SARS CORONAVIRUS 2 (TAT 6-24 HRS): SARS Coronavirus 2: NEGATIVE

## 2019-06-14 ENCOUNTER — Ambulatory Visit
Admission: RE | Admit: 2019-06-14 | Discharge: 2019-06-14 | Disposition: A | Discharge status: Home / Self Care | Payer: Medicare Other | Attending: Cardiovascular Disease | Admitting: Cardiovascular Disease

## 2019-06-14 ENCOUNTER — Encounter: Payer: Self-pay | Admitting: *Deleted

## 2019-06-14 ENCOUNTER — Encounter
Admission: RE | Disposition: A | Discharge status: Home / Self Care | Payer: Self-pay | Source: Home / Self Care | Attending: Cardiovascular Disease

## 2019-06-14 ENCOUNTER — Other Ambulatory Visit: Payer: Self-pay

## 2019-06-14 DIAGNOSIS — Z8249 Family history of ischemic heart disease and other diseases of the circulatory system: Secondary | ICD-10-CM | POA: Insufficient documentation

## 2019-06-14 DIAGNOSIS — I10 Essential (primary) hypertension: Secondary | ICD-10-CM | POA: Insufficient documentation

## 2019-06-14 DIAGNOSIS — Z791 Long term (current) use of non-steroidal anti-inflammatories (NSAID): Secondary | ICD-10-CM | POA: Insufficient documentation

## 2019-06-14 DIAGNOSIS — I200 Unstable angina: Secondary | ICD-10-CM | POA: Insufficient documentation

## 2019-06-14 DIAGNOSIS — Z79899 Other long term (current) drug therapy: Secondary | ICD-10-CM | POA: Insufficient documentation

## 2019-06-14 DIAGNOSIS — E669 Obesity, unspecified: Secondary | ICD-10-CM | POA: Insufficient documentation

## 2019-06-14 DIAGNOSIS — Z6834 Body mass index (BMI) 34.0-34.9, adult: Secondary | ICD-10-CM | POA: Diagnosis not present

## 2019-06-14 DIAGNOSIS — E559 Vitamin D deficiency, unspecified: Secondary | ICD-10-CM | POA: Insufficient documentation

## 2019-06-14 DIAGNOSIS — E785 Hyperlipidemia, unspecified: Secondary | ICD-10-CM | POA: Insufficient documentation

## 2019-06-14 HISTORY — PX: LEFT HEART CATH AND CORONARY ANGIOGRAPHY: CATH118249

## 2019-06-14 SURGERY — LEFT HEART CATH AND CORONARY ANGIOGRAPHY
Anesthesia: Moderate Sedation | Laterality: Left

## 2019-06-14 MED ORDER — HEPARIN SODIUM (PORCINE) 1000 UNIT/ML IJ SOLN
INTRAMUSCULAR | Status: AC
  Filled 2019-06-14: qty 1

## 2019-06-14 MED ORDER — MIDAZOLAM HCL 2 MG/2ML IJ SOLN
INTRAMUSCULAR | Status: AC
  Filled 2019-06-14: qty 2

## 2019-06-14 MED ORDER — SODIUM CHLORIDE 0.9% FLUSH: 3.0000 mL | INTRAVENOUS | Status: DC | PRN

## 2019-06-14 MED ORDER — HEPARIN (PORCINE) IN NACL 1000-0.9 UT/500ML-% IV SOLN
INTRAVENOUS | Status: AC
  Filled 2019-06-14: qty 1000

## 2019-06-14 MED ORDER — HEPARIN SODIUM (PORCINE) 1000 UNIT/ML IJ SOLN
INTRAMUSCULAR | Status: DC | PRN
  Administered 2019-06-14: 4500 [IU] via INTRAVENOUS

## 2019-06-14 MED ORDER — VERAPAMIL HCL 2.5 MG/ML IV SOLN
INTRAVENOUS | Status: DC | PRN
  Administered 2019-06-14: 2.5 mg via INTRA_ARTERIAL

## 2019-06-14 MED ORDER — FENTANYL CITRATE (PF) 100 MCG/2ML IJ SOLN
INTRAMUSCULAR | Status: DC | PRN
  Administered 2019-06-14: 50 ug via INTRAVENOUS

## 2019-06-14 MED ORDER — SODIUM CHLORIDE 0.9 % IV SOLN
INTRAVENOUS | Status: DC
  Administered 2019-06-14: 11:00:00 via INTRAVENOUS

## 2019-06-14 MED ORDER — SODIUM CHLORIDE 0.9 % IV SOLN: 250.0000 mL | INTRAVENOUS | Status: DC | PRN

## 2019-06-14 MED ORDER — MIDAZOLAM HCL 2 MG/2ML IJ SOLN
INTRAMUSCULAR | Status: DC | PRN
  Administered 2019-06-14: 1 mg via INTRAVENOUS

## 2019-06-14 MED ORDER — FENTANYL CITRATE (PF) 100 MCG/2ML IJ SOLN
INTRAMUSCULAR | Status: AC
  Filled 2019-06-14: qty 2

## 2019-06-14 MED ORDER — IOHEXOL 300 MG/ML  SOLN
INTRAMUSCULAR | Status: DC | PRN
  Administered 2019-06-14: 60 mL via INTRA_ARTERIAL

## 2019-06-14 MED ORDER — ASPIRIN 81 MG PO CHEW: 81.0000 mg | CHEWABLE_TABLET | ORAL | Status: DC

## 2019-06-14 MED ORDER — SODIUM CHLORIDE 0.9% FLUSH: 3.0000 mL | Freq: Two times a day (BID) | INTRAVENOUS | Status: DC

## 2019-06-14 MED ORDER — HEPARIN (PORCINE) IN NACL 1000-0.9 UT/500ML-% IV SOLN
INTRAVENOUS | Status: DC | PRN
  Administered 2019-06-14: 500 mL

## 2019-06-14 MED ORDER — VERAPAMIL HCL 2.5 MG/ML IV SOLN
INTRAVENOUS | Status: AC
  Filled 2019-06-14: qty 2

## 2019-06-14 SURGICAL SUPPLY — 9 items
CATH INFINITI 5 FR JL3.5 (CATHETERS) ×2 IMPLANT
CATH INFINITI 5FR JK (CATHETERS) ×2 IMPLANT
CATH INFINITI 5FR JL4 (CATHETERS) IMPLANT
CATH INFINITI JR4 5F (CATHETERS) ×2 IMPLANT
DEVICE RAD TR BAND REGULAR (VASCULAR PRODUCTS) ×2 IMPLANT
GLIDESHEATH SLEND SS 6F .021 (SHEATH) ×2 IMPLANT
KIT MANI 3VAL PERCEP (MISCELLANEOUS) ×2 IMPLANT
PACK CARDIAC CATH (CUSTOM PROCEDURE TRAY) ×2 IMPLANT
WIRE ROSEN-J .035X260CM (WIRE) ×2 IMPLANT

## 2019-06-14 NOTE — Progress Notes (Signed)
Patient remains clinically stable post heart cath per DR Fletcher Anon, tolerated well. TR band deflated, no bleeding nor hematoma at right radial site. Denies complaints. Called Sylvia/daughter and gave discharge instructions over phone with questions answered.

## 2019-06-14 NOTE — Interval H&P Note (Signed)
Cath Lab Visit (complete for each Cath Lab visit)  Clinical Evaluation Leading to the Procedure:   ACS:  no  Non-ACS:    Anginal Classification: CCS III  Anti-ischemic medical therapy: Minimal Therapy (1 class of medications)  Non-Invasive Test Results: No non-invasive testing performed  Prior CABG: No previous CABG      History and Physical Interval Note:  06/14/2019 10:25 AM  Cindy Stein  has presented today for surgery, with the diagnosis of LT Heart Cath   Unstable Angina.  The various methods of treatment have been discussed with the patient and family. After consideration of risks, benefits and other options for treatment, the patient has consented to  Procedure(s): LEFT HEART CATH AND CORONARY ANGIOGRAPHY (Left) as a surgical intervention.  The patient's history has been reviewed, patient examined, no change in status, stable for surgery.  I have reviewed the patient's chart and labs.  Questions were answered to the patient's satisfaction.     Cindy Stein

## 2019-06-14 NOTE — Discharge Instructions (Signed)
Moderate Conscious Sedation, Adult, Care After °These instructions provide you with information about caring for yourself after your procedure. Your health care provider may also give you more specific instructions. Your treatment has been planned according to current medical practices, but problems sometimes occur. Call your health care provider if you have any problems or questions after your procedure. °What can I expect after the procedure? °After your procedure, it is common: °· To feel sleepy for several hours. °· To feel clumsy and have poor balance for several hours. °· To have poor judgment for several hours. °· To vomit if you eat too soon. °Follow these instructions at home: °For at least 24 hours after the procedure: ° °· Do not: °? Participate in activities where you could fall or become injured. °? Drive. °? Use heavy machinery. °? Drink alcohol. °? Take sleeping pills or medicines that cause drowsiness. °? Make important decisions or sign legal documents. °? Take care of children on your own. °· Rest. °Eating and drinking °· Follow the diet recommended by your health care provider. °· If you vomit: °? Drink water, juice, or soup when you can drink without vomiting. °? Make sure you have little or no nausea before eating solid foods. °General instructions °· Have a responsible adult stay with you until you are awake and alert. °· Take over-the-counter and prescription medicines only as told by your health care provider. °· If you smoke, do not smoke without supervision. °· Keep all follow-up visits as told by your health care provider. This is important. °Contact a health care provider if: °· You keep feeling nauseous or you keep vomiting. °· You feel light-headed. °· You develop a rash. °· You have a fever. °Get help right away if: °· You have trouble breathing. °This information is not intended to replace advice given to you by your health care provider. Make sure you discuss any questions you have  with your health care provider. °Document Released: 09/08/2013 Document Revised: 10/31/2017 Document Reviewed: 03/09/2016 °Elsevier Patient Education © 2020 [employer] °Radial Site Care ° °This sheet gives you information about how to care for yourself after your procedure. Your health care provider may also give you more specific instructions. If you have problems or questions, contact your health care provider. °What can I expect after the procedure? °After the procedure, it is common to have: °· Bruising and tenderness at the catheter insertion area. °Follow these instructions at home: °Medicines °· Take over-the-counter and prescription medicines only as told by your health care provider. °Insertion site care °· Follow instructions from your health care provider about how to take care of your insertion site. Make sure you: °? Wash your hands with soap and water before you change your bandage (dressing). If soap and water are not available, use hand sanitizer. °? Change your dressing as told by your health care provider. °? Leave stitches (sutures), skin glue, or adhesive strips in place. These skin closures may need to stay in place for 2 weeks or longer. If adhesive strip edges start to loosen and curl up, you may trim the loose edges. Do not remove adhesive strips completely unless your health care provider tells you to do that. °· Check your insertion site every day for signs of infection. Check for: °? Redness, swelling, or pain. °? Fluid or blood. °? Pus or a bad smell. °? Warmth. °· Do not take baths, swim, or use a hot tub until your health care provider approves. °· You may   shower 24-48 hours after the procedure, or as directed by your health care provider. °? Remove the dressing and gently wash the site with plain soap and water. °? Pat the area dry with a clean towel. °? Do not rub the site. That could cause bleeding. °· Do not apply powder or lotion to the site. °Activity ° °· For 24 hours after  the procedure, or as directed by your health care provider: °? Do not flex or bend the affected arm. °? Do not push or pull heavy objects with the affected arm. °? Do not drive yourself home from the hospital or clinic. You may drive 24 hours after the procedure unless your health care provider tells you not to. °? Do not operate machinery or power tools. °· Do not lift anything that is heavier than 10 lb (4.5 kg), or the limit that you are told, until your health care provider says that it is safe. °· Ask your health care provider when it is okay to: °? Return to work or school. °? Resume usual physical activities or sports. °? Resume sexual activity. °General instructions °· If the catheter site starts to bleed, raise your arm and put firm pressure on the site. If the bleeding does not stop, get help right away. This is a medical emergency. °· If you went home on the same day as your procedure, a responsible adult should be with you for the first 24 hours after you arrive home. °· Keep all follow-up visits as told by your health care provider. This is important. °Contact a health care provider if: °· You have a fever. °· You have redness, swelling, or yellow drainage around your insertion site. °Get help right away if: °· You have unusual pain at the radial site. °· The catheter insertion area swells very fast. °· The insertion area is bleeding, and the bleeding does not stop when you hold steady pressure on the area. °· Your arm or hand becomes pale, cool, tingly, or numb. °These symptoms may represent a serious problem that is an emergency. Do not wait to see if the symptoms will go away. Get medical help right away. Call your local emergency services (911 in the [place] Do not drive yourself to the hospital. °Summary °· After the procedure, it is common to have bruising and tenderness at the site. °· Follow instructions from your health care provider about how to take care of your radial site wound. Check the  wound every day for signs of infection. °· Do not lift anything that is heavier than 10 lb (4.5 kg), or the limit that you are told, until your health care provider says that it is safe. °This information is not intended to replace advice given to you by your health care provider. Make sure you discuss any questions you have with your health care provider. °Document Released: 12/21/2010 Document Revised: 12/24/2017 Document Reviewed: 12/24/2017 °Elsevier Patient Education © 2020 [employer] Inc[employer] ° °

## 2019-06-27 NOTE — Progress Notes (Signed)
Office Visit    Patient Name: Cindy Stein Date of Encounter: 06/29/2019  Primary Care Provider:  Lorelee Market, MD Primary Cardiologist:  [name], MD  Chief Complaint    63 yo female with PMH chest pain, HTN, HLD presents for follow up after cardiac catheterization 06/14/19.   Past Medical History    Past Medical History:  Diagnosis Date  . Arthritis of left ankle   . Arthritis of left knee   . Hypertension   . Plantar fascial fibromatosis   . PVC (premature ventricular contraction)   . Unspecified osteoarthritis, unspecified site   . Vitamin D deficiency    Past Surgical History:  Procedure Laterality Date  . CARDIAC CATHETERIZATION  2006  . carpel tunnel    . GALLBLADDER SURGERY    . LEFT HEART CATH AND CORONARY ANGIOGRAPHY Left 06/14/2019   Procedure: LEFT HEART CATH AND CORONARY ANGIOGRAPHY;  Surgeon: Wellington Hampshire, MD;  Location: [facility];  Service: Cardiovascular;  Laterality: Left;    Allergies  No Known Allergies  History of Present Illness    Cindy Stein is a 63 yo female with a PMH of HTN, HLD, obesity, arthritis. She has a strong family history of CAD; her mother died of MI, father had MI at 34, sister had MI at 28. Reports past cardiac workup in [place] in 2006 including echocardiogram, stress test, cardiac cath with no obstructive coronary disease. She is a never smoker. Last seen by Dr. Fletcher Anon 06/03/19 for evaluation of chest pain associated with shortness of breath. Noted to have some chronic GERD symptoms, but also concern for unstable angina. She was recommended to undergo cardiac cath which was done on 06/14/19. LHC 06/14/19 with normal coronary arteries, mildly elevated LV diastolic pressure, LV 00-93%. Of note, she had frequent PVCs during the procedure.   Reported lipid profile in January with PCP shows total cholesterol 165, triglycerides 164, LDL 72.   Very pleasant [place] woman. She lives with her husband who  she helps to manage his diabetes. Reports eating a heart healthy diet.   Reassurance provided that her cardiac catheterization showed normal coronary arteries. She continues to have episodes of chest discomfort. Difficult to ascertain the character of these episodes. She tells me this occurs with rest and with activity. Tells me she has had them while walking to the mailbox and while sitting watching TV. Episodes are midsternal and radiate to her neck. When trying to describe she makes a motion as if someone is "squeezing" her neck to describe the sensation. These episodes are associated with shortness of breath, last a few minutes, relieved by rest, and are without known aggravating factors. Question if these episodes she is feeling are secondary to PVCs noted during her cardiac cath.  When I ask about history of GERD she tells me she gets acid reflux "sometimes", but does not require any OTC medications for management. Tells me she had workup in [place] approximately in 2006 with EGD, colonoscopy and was told she had "bacteria" and to take a probiotic.  Long discussion regarding PVCs. She does share that helping her husband manage his medical conditions is sometimes stressful. Does not take any over the counter pro-arrthymic medications such as cold/flu medications, sleep aids. In our discussion regarding labs to check her electrolytes she mentions she has had leg cramping. Onset two months ago, occurs at night, she tried tonic water with no relief. Occurs in her calves down to her feet.   She  denies palpitations, dizziness, syncope. Some intermittent edema in her LE by the end of the day which resolves by morning.   EKGs/Labs/Other Studies Reviewed:   The following studies were reviewed today:  LHC 06/14/19  The left ventricular systolic function is normal.  LV end diastolic pressure is mildly elevated.  The left ventricular ejection fraction is 55-65% by visual estimate. 1.  Normal coronary  arteries. 2.  Normal LV systolic function.  Mildly elevated left ventricular end-diastolic pressure post frequent PVCs.  EKG:  EKG is ordered today.  The ekg ordered today demonstrates sinus rhythm rate 71 without acute ST/T wave changes.   Recent Labs: 06/03/2019: BUN 22; Creatinine, Ser 0.84; Hemoglobin 14.6; Platelets 211; Potassium 4.1; Sodium 139  Recent Lipid Panel No results found for: CHOL, TRIG, HDL, CHOLHDL, VLDL, LDLCALC, LDLDIRECT  Home Medications   Prior to Admission medications   Medication Sig Start Date End Date Taking? Authorizing Provider  carvedilol (COREG) 3.125 MG tablet Take 3.125 mg by mouth 2 (two) times daily with a meal.    [provider]  meloxicam (MOBIC) 15 MG tablet Take 1 tablet (15 mg total) by mouth daily. 11/23/18   Cuthriell, Charline Bills, PA-C  olmesartan-hydrochlorothiazide (BENICAR HCT) 20-12.5 MG tablet Take 1 tablet by mouth daily.    [provider]  traMADol (ULTRAM) 50 MG tablet Take 50 mg by mouth every 6 (six) hours as needed (pain).     [provider]  Turmeric Curcumin 500 MG CAPS Take 500 mg by mouth daily.     [provider]     Review of Systems    Review of Systems  Constitution: Negative for chills, fever and malaise/fatigue.  Cardiovascular: Negative for dyspnea on exertion, irregular heartbeat, leg swelling and palpitations.       Episodes of chest discomfort, see HPI for details  Respiratory: Positive for shortness of breath (with episodes of chest discomfort). Negative for cough and wheezing.   Musculoskeletal: Positive for joint pain (due to arthritis) and muscle cramps (leg cramps at night).  Gastrointestinal: Negative for heartburn, nausea and vomiting.  Neurological: Negative for dizziness, light-headedness and weakness.   All other systems reviewed and are otherwise negative except as noted above.  Physical Exam    VS:  BP 106/60 (BP Location: Left Arm, Patient Position: Sitting, Cuff  Size: Normal)   Pulse 71   Temp (!) 97.2 F (36.2 C)   Ht 5\' 4"  (1.626 m)   Wt 203 lb 8 oz (92.3 kg)   SpO2 97%   BMI 34.93 kg/m  , BMI Body mass index is 34.93 kg/m. GEN: Well nourished, overweight, well developed, in no acute distress. HEENT: normal. Neck: Supple, no JVD, carotid bruits, or masses. Cardiac: RRR, no murmurs, rubs, or gallops. No clubbing, cyanosis, edema.  Radials/DP/PT 2+ and equal bilaterally.  Respiratory:  Respirations regular and unlabored, clear to auscultation bilaterally. GI: Soft, nontender, nondistended. MS: no deformity or atrophy. Skin: warm and dry, no rash. Neuro:  Strength and sensation are intact. Psych: Normal affect.  Accessory Clinical Findings    ECG personally reviewed by me today - sinus rhythm rate 71 - no acute changes.  Assessment & Plan    1.  Chest pain - Non cardiac per [facility] 06/14/19 with no coronary artery disease. Reassurance provided. She notes continued episodes of chest discomfort midsternal which radiates and feels like someone is "squeezing her neck". Occurs at rest and with activity. Associated with SOB.   Etiology PVC vs indigestion  vs anxiety. Workup for PVC as below. Recommend she follow up with her PCP.   2. PVCs - Noted frequent PVCs during cardiac cath. She denies palpitations, but does not episodes of chest discomfort as above. She does take Coreg 3.25mg  BID - no changes today. Consider increased dose versus transition to Metoprolol/Acebutolol if high PVC burden on Zio as her BP is low normal 106/60.   7 day ZIO.   BMET, magnesium, TSH today.   3. HTN - Well controlled. Continue to follow with PCP. BP low normal today 106/60.   4. Lipid profile - Lipid profile January total cholesterol 165 triglycerides 164 LDL 72. Continue monitoring by PCP. Given no coronary artery calcification on recent cath, no indication for statin. Recommend heart healthy diet.   5. Leg cramping - Onset 2 months ago, occurs at night. Has tried  tonic water per a friend's recommendation with no relief. No noted relieving factors. Labs today including K and Mag, will replete if indicated. Recommend she follow up with her PCP.   Disposition: 7 day Zio. BMET, Mag, TSH today. Follow up with APP or Dr. Fletcher Anon in 4 weeks.   Loel Dubonnet, NP 06/29/2019, 9:33 AM

## 2019-06-29 ENCOUNTER — Ambulatory Visit (INDEPENDENT_AMBULATORY_CARE_PROVIDER_SITE_OTHER): Payer: Medicare Other

## 2019-06-29 ENCOUNTER — Encounter: Payer: Self-pay | Admitting: Physician Assistant

## 2019-06-29 ENCOUNTER — Other Ambulatory Visit: Payer: Self-pay

## 2019-06-29 ENCOUNTER — Ambulatory Visit (INDEPENDENT_AMBULATORY_CARE_PROVIDER_SITE_OTHER): Payer: Medicare Other | Admitting: Family

## 2019-06-29 VITALS — BP 106/60 | HR 71 | Temp 97.2°F | Ht 64.0 in | Wt 203.5 lb

## 2019-06-29 DIAGNOSIS — I493 Ventricular premature depolarization: Secondary | ICD-10-CM

## 2019-06-29 DIAGNOSIS — R252 Cramp and spasm: Secondary | ICD-10-CM | POA: Diagnosis not present

## 2019-06-29 DIAGNOSIS — R0789 Other chest pain: Secondary | ICD-10-CM | POA: Diagnosis not present

## 2019-06-29 DIAGNOSIS — I200 Unstable angina: Secondary | ICD-10-CM

## 2019-06-29 DIAGNOSIS — I10 Essential (primary) hypertension: Secondary | ICD-10-CM

## 2019-06-29 NOTE — Patient Instructions (Signed)
Medication Instructions:  Your physician recommends that you continue on your current medications as directed. Please refer to the Current Medication list given to you today.  If you need a refill on your cardiac medications before your next appointment, please call your pharmacy.   Lab work: [employer] today (BMET, Mag, TSH)  If you have labs (blood work) drawn today and your tests are completely normal, you will receive your results only by: Marland Kitchen MyChart Message (if you have MyChart) OR . A paper copy in the mail If you have any lab test that is abnormal or we need to change your treatment, we will call you to review the results.  Testing/Procedures: 1- A zio monitor was placed today. It will remain on for 14 days. You will then return monitor and event diary in provided box. It takes 1-2 weeks for report to be downloaded and returned to [place]. We will call you with the results. If monitor falls of or has orange flashing light, please call Zio for further instructions.     Follow-Up: At [facility], you and your health needs are our priority.  As part of our continuing mission to provide you with exceptional heart care, we have created designated Provider Care Teams.  These Care Teams include your primary Cardiologist (physician) and Advanced Practice Providers (APPs -  Physician Assistants and Nurse Practitioners) who all work together to provide you with the care you need, when you need it. You will need a follow up appointment in 4 weeks. You may see [name], MD or Christell Faith, PA-C.

## 2019-06-30 ENCOUNTER — Telehealth: Payer: Self-pay | Admitting: Cardiovascular Disease

## 2019-06-30 LAB — BASIC METABOLIC PANEL
BUN/Creatinine Ratio: 38 — ABNORMAL HIGH (ref 12–28)
BUN: 29 mg/dL — ABNORMAL HIGH (ref 8–27)
CO2: 24 mmol/L (ref 20–29)
Calcium: 9.3 mg/dL (ref 8.7–10.3)
Chloride: 105 mmol/L (ref 96–106)
Creatinine, Ser: 0.76 mg/dL (ref 0.57–1.00)
GFR calc Af Amer: 95 mL/min/{1.73_m2} (ref >59)
GFR calc non Af Amer: 83 mL/min/{1.73_m2} (ref >59)
Glucose: 95 mg/dL (ref 65–99)
Potassium: 4 mmol/L (ref 3.5–5.2)
Sodium: 143 mmol/L (ref 134–144)

## 2019-06-30 LAB — TSH: TSH: 2.56 u[IU]/mL (ref 0.450–4.500)

## 2019-06-30 LAB — MAGNESIUM: Magnesium: 2.1 mg/dL (ref 1.6–2.3)

## 2019-06-30 NOTE — Telephone Encounter (Signed)
Patient had monitor put on her yesterday in office and throughout the night the monitor fell off. Patient would like advice on what she should do.

## 2019-06-30 NOTE — Telephone Encounter (Signed)
The patient called back. She spoke with [employer] and they walked her through how to re-apply the monitor and reinforce with medical tape. She states [employer] told her to call back tomorrow and if her monitor falls off again and they will send her another one.   She states the monitor is currently back on and the orange light is off.   I have advised her to press down on the flaps of the monitor twice daily and smooth out toward the outer edges as it will adhere to her skin better with the heat applied from her hands.  She voices understanding and is agreeable.

## 2019-06-30 NOTE — Telephone Encounter (Signed)
I spoke with the patient. She states she didn't feel like her monitor was sticking well to her skin when she left the office yesterday.   She states she saw the orange light flashing on the monitor last night and then at 4:30 am she woke up and the monitor was off. She states the monitor is still off and orange lights are flashing.   I asked her if the outer edges appear to be in good order or if they have folded in on themselves. She states she feels like they look ok. She did not get the area wet last night and she has not been excessively sweaty.  I have asked her to call [employer] to report the issue. She is aware we may need to place another monitor, but will see if the company can troubleshoot this with her first.  She was concerned she would be charged for an additional monitor. I advised she should only be charged for one if we have to re-apply an additional one.  The patient voices understanding of the above and is agreeable.

## 2019-07-22 DIAGNOSIS — Z8679 Personal history of other diseases of the circulatory system: Secondary | ICD-10-CM

## 2019-07-22 HISTORY — DX: Personal history of other diseases of the circulatory system: Z86.79

## 2019-07-26 ENCOUNTER — Telehealth: Payer: Self-pay | Admitting: Cardiovascular Disease

## 2019-07-26 NOTE — Telephone Encounter (Signed)
I have recorded that pt will be having a visitor coming d/t she has a hard time answering questions and remembering orders.   Advised pt to call for any further questions or concerns.

## 2019-07-26 NOTE — Telephone Encounter (Signed)
Patient calling - has an appointment on 8/27 with Dr Fletcher Anon  Patient is requesting that her daughter come to appointment, states it is difficult for her to understand sometimes and would like daughter in room for clarification  Please call and advise on visitor - ok to leave a detailed message

## 2019-07-29 ENCOUNTER — Telehealth: Payer: Self-pay

## 2019-07-29 ENCOUNTER — Ambulatory Visit (INDEPENDENT_AMBULATORY_CARE_PROVIDER_SITE_OTHER): Payer: Medicare Other | Admitting: Cardiovascular Disease

## 2019-07-29 ENCOUNTER — Other Ambulatory Visit: Payer: Self-pay

## 2019-07-29 ENCOUNTER — Encounter: Payer: Self-pay | Admitting: Cardiovascular Disease

## 2019-07-29 VITALS — BP 132/70 | HR 60 | Ht 64.0 in | Wt 205.0 lb

## 2019-07-29 DIAGNOSIS — I10 Essential (primary) hypertension: Secondary | ICD-10-CM

## 2019-07-29 DIAGNOSIS — I493 Ventricular premature depolarization: Secondary | ICD-10-CM | POA: Diagnosis not present

## 2019-07-29 DIAGNOSIS — R0789 Other chest pain: Secondary | ICD-10-CM

## 2019-07-29 DIAGNOSIS — I200 Unstable angina: Secondary | ICD-10-CM | POA: Diagnosis not present

## 2019-07-29 NOTE — Patient Instructions (Signed)
Medication Instructions:  Your physician recommends that you continue on your current medications as directed. Please refer to the Current Medication list given to you today.  If you need a refill on your cardiac medications before your next appointment, please call your pharmacy.   Lab work: None ordered If you have labs (blood work) drawn today and your tests are completely normal, you will receive your results only by: Marland Kitchen MyChart Message (if you have MyChart) OR . A paper copy in the mail If you have any lab test that is abnormal or we need to change your treatment, we will call you to review the results.  Testing/Procedures: None ordered  Follow-Up: At [facility], you and your health needs are our priority.  As part of our continuing mission to provide you with exceptional heart care, we have created designated Provider Care Teams.  These Care Teams include your primary Cardiologist (physician) and Advanced Practice Providers (APPs -  Physician Assistants and Nurse Practitioners) who all work together to provide you with the care you need, when you need it. You will need a follow up appointment as needed.   You may see [name], MD or one of the following Advanced Practice Providers on your designated Care Team:   Murray Hodgkins, NP Christell Faith, PA-C . Marrianne Mood, PA-C  Any Other Special Instructions Will Be Listed Below (If Applicable). N/A

## 2019-07-29 NOTE — Telephone Encounter (Signed)
Called pt to discuss monitor results with patient. She verbalized understanding and reported that she has an appt with Dr. Fletcher Anon today and will discuss with him further at that time.   No new orders.   Advised pt to call for any further questions or concerns.

## 2019-07-29 NOTE — Progress Notes (Signed)
Cardiology Office Note   Date:  07/29/2019   ID:  Kori Knappe, DOB [birth year], MRN [id number]  PCP:  Lorelee Market, MD  Cardiologist:   [name], MD   Chief Complaint  Patient presents with  . Other    4 week follow up. Patient c/o swelling in ankles and worsening SOB. Meds reviewed verbally with patient.       History of Present Illness: Cindy Stein is a 63 y.o. female who presents for a follow-up visit regarding chest pain and shortness of breath.    She has known history of essential hypertension, hyperlipidemia and obesity.  She also has strong family history of coronary artery disease affecting almost every family member.  Her mother died of myocardial infarction.  Her father had myocardial infarction at 58 and sister had myocardial infarction at 37.  She was seen recently for symptoms of chest pain and shortness of breath worrisome for possible unstable angina.  I proceeded with cardiac catheterization last month which showed normal coronary arteries, normal LV systolic function and mildly elevated left ventricular end-diastolic pressure.  LVEDP was 17 mmHg but it was after catheter induced PVCs.  She underwent a 7-day ZIO patch monitor which showed sinus rhythm with no evidence of PVCs.  She had 1 short run of SVT that lasted 5 beats.   Past Medical History:  Diagnosis Date  . Arthritis of left ankle   . Arthritis of left knee   . Hypertension   . Plantar fascial fibromatosis   . PVC (premature ventricular contraction)   . Unspecified osteoarthritis, unspecified site   . Vitamin D deficiency     Past Surgical History:  Procedure Laterality Date  . CARDIAC CATHETERIZATION  2006  . carpel tunnel    . GALLBLADDER SURGERY    . LEFT HEART CATH AND CORONARY ANGIOGRAPHY Left 06/14/2019   Procedure: LEFT HEART CATH AND CORONARY ANGIOGRAPHY;  Surgeon: Wellington Hampshire, MD;  Location: [place] [facility];  Service: Cardiovascular;  Laterality: Left;      Current Outpatient Medications  Medication Sig Dispense Refill  . carvedilol (COREG) 3.125 MG tablet Take 3.125 mg by mouth 2 (two) times daily with a meal.    . meloxicam (MOBIC) 15 MG tablet Take 1 tablet (15 mg total) by mouth daily. 30 tablet 0  . olmesartan-hydrochlorothiazide (BENICAR HCT) 20-12.5 MG tablet Take 1 tablet by mouth daily.    . Turmeric Curcumin 500 MG CAPS Take 500 mg by mouth daily.      No current facility-administered medications for this visit.     Allergies:   Patient has no known allergies.    Social History:  The patient  reports that she has never smoked. She has never used smokeless tobacco. She reports current alcohol use. She reports that she does not use drugs.   Family History:  The patient's family history includes Asthma in her father and mother; Heart failure in her father.    ROS:  Please see the history of present illness.   Otherwise, review of systems are positive for none.   All other systems are reviewed and negative.    PHYSICAL EXAM: VS:  BP 132/70 (BP Location: Left Arm, Patient Position: Sitting, Cuff Size: Normal)   Pulse 60   Ht 5\' 4"  (1.626 m)   Wt 205 lb (93 kg)   BMI 35.19 kg/m  , BMI Body mass index is 35.19 kg/m. GEN: Well nourished, well developed, in no acute distress  HEENT:  normal  Neck: no JVD, carotid bruits, or masses Cardiac: RRR; no murmurs, rubs, or gallops,no edema .   Respiratory:  clear to auscultation bilaterally, normal work of breathing GI: soft, nontender, nondistended, + BS MS: no deformity or atrophy  Skin: warm and dry, no rash Neuro:  Strength and sensation are intact Psych: euthymic mood, full affect Right radial pulses mildly diminished but palpable.  No hematoma.  EKG:  EKG is not ordered today.    Recent Labs: 06/03/2019: Hemoglobin 14.6; Platelets 211 06/29/2019: BUN 29; Creatinine, Ser 0.76; Magnesium 2.1; Potassium 4.0; Sodium 143; TSH 2.560    Lipid Panel No results found for: CHOL,  TRIG, HDL, CHOLHDL, VLDL, LDLCALC, LDLDIRECT    Wt Readings from Last 3 Encounters:  07/29/19 205 lb (93 kg)  06/29/19 203 lb 8 oz (92.3 kg)  06/14/19 201 lb 1 oz (91.2 kg)      PAD Screen 06/03/2019  Previous PAD dx? No  Previous surgical procedure? No  Pain with walking? No  Feet/toe relief with dangling? No  Painful, non-healing ulcers? No  Extremities discolored? No      ASSESSMENT AND PLAN:  1.  Noncardiac chest pain: Recent cardiac catheterization showed normal coronary arteries.  No further work-up is needed.   2.  Exertional dyspnea: LVEDP was mildly elevated but it was after catheter induced PVCs.  Certainly a mild degree of diastolic heart failure is a possibility but her symptoms are out of proportion to this.  She is on small dose hydrochlorothiazide and I do not think there is a need to switch to a loop diuretic. I do suspect that there is a component of physical deconditioning.  I advised her to start some form of exercise and try to lose some weight.  3.  Essential hypertension: Blood pressure is controlled.  4.  Stress and anxiety, she reports significant symptoms of this which might be contributing to some of her physical complaints.  I asked her to discuss this with her primary care physician.    Disposition:   FU with me as needed.  Signed,  [name], MD  07/29/2019 3:35 PM    [place]

## 2019-07-29 NOTE — Telephone Encounter (Signed)
-----   Message from Theora Gianotti, NP sent at 07/28/2019  4:55 PM EDT ----- 1 brief run of fast heart beats from the top chambers of the heart (5 beats SVT). Rare extra beats from the top chambers w/ no extra beats from the bottom chambers (PVCs), which is what we were looking for.  Cont current dose of carvedilol.

## 2019-11-02 ENCOUNTER — Telehealth: Payer: Self-pay | Admitting: Cardiovascular Disease

## 2019-11-02 NOTE — Telephone Encounter (Signed)
° °  [facility] Pre-operative Risk Assessment    Request for surgical clearance:  1. What type of surgery is being performed? RT TKA traditional knee    2. When is this surgery scheduled? 11/24/19   3. What type of clearance is required (medical clearance vs. Pharmacy clearance to hold med vs. Both)? both  4. Are there any medications that need to be held prior to surgery and how long? None listed, please advise   5. Practice name and name of physician performing surgery? [facility], Dr Kurtis Bushman   6. What is your office phone number [phone]    7.   What is your office fax number [phone] ATTN: Jonelle Sidle Rice   8.   Anesthesia type (None, local, MAC, general) ? Regional    Jackson Gerringer 11/02/2019, 4:43 PM  _________________________________________________________________   (provider comments below)

## 2019-11-03 NOTE — Telephone Encounter (Signed)
   Primary Cardiologist: [name], MD  Chart reviewed as part of pre-operative protocol coverage. Patient was contacted 11/03/2019 in reference to pre-operative risk assessment for pending surgery as outlined below.  Cindy Stein was last seen on 07/29/2019 by Dr. Fletcher Anon.  She had normal heart cath in 06/2019. Currently Cindy Stein is doing well. She is low risk for surgery from a cardiac standpoint.   Therefore, based on ACC/AHA guidelines, the patient would be at acceptable risk for the planned procedure without further cardiovascular testing.   I will route this recommendation to the requesting party via Epic fax function and remove from pre-op pool.  Please call with questions.  Daune Perch, NP 11/03/2019, 11:18 AM

## 2019-11-03 NOTE — Telephone Encounter (Signed)
Pt with normal cath in 06/2019. She would be low risk for surgery.  I have placed a call to the patient to update her status. Left Vm for her to call back and ask for preop clinic.

## 2019-11-03 NOTE — Telephone Encounter (Signed)
Patient is returning your call.  

## 2019-12-03 HISTORY — PX: JOINT REPLACEMENT: SHX530

## 2020-03-20 ENCOUNTER — Other Ambulatory Visit: Payer: Self-pay | Admitting: Physician Assistant

## 2020-03-20 ENCOUNTER — Encounter: Payer: Self-pay | Admitting: Emergency Medicine

## 2020-03-20 ENCOUNTER — Encounter (INDEPENDENT_AMBULATORY_CARE_PROVIDER_SITE_OTHER): Payer: Self-pay

## 2020-03-20 ENCOUNTER — Emergency Department
Admission: EM | Admit: 2020-03-20 | Discharge: 2020-03-20 | Disposition: A | Discharge status: Home / Self Care | Payer: Medicare Other | Source: Home / Self Care | Attending: Emergency Medicine | Admitting: Emergency Medicine

## 2020-03-20 ENCOUNTER — Other Ambulatory Visit: Payer: Self-pay

## 2020-03-20 ENCOUNTER — Ambulatory Visit
Admission: RE | Admit: 2020-03-20 | Discharge: 2020-03-20 | Disposition: A | Discharge status: Home / Self Care | Payer: Medicare Other | Source: Ambulatory Visit | Attending: Physician Assistant | Admitting: Physician Assistant

## 2020-03-20 DIAGNOSIS — R2242 Localized swelling, mass and lump, left lower limb: Secondary | ICD-10-CM

## 2020-03-20 DIAGNOSIS — I82412 Acute embolism and thrombosis of left femoral vein: Secondary | ICD-10-CM | POA: Insufficient documentation

## 2020-03-20 DIAGNOSIS — I82409 Acute embolism and thrombosis of unspecified deep veins of unspecified lower extremity: Secondary | ICD-10-CM

## 2020-03-20 DIAGNOSIS — I2699 Other pulmonary embolism without acute cor pulmonale: Secondary | ICD-10-CM | POA: Diagnosis not present

## 2020-03-20 DIAGNOSIS — Z79899 Other long term (current) drug therapy: Secondary | ICD-10-CM | POA: Insufficient documentation

## 2020-03-20 DIAGNOSIS — I10 Essential (primary) hypertension: Secondary | ICD-10-CM | POA: Insufficient documentation

## 2020-03-20 HISTORY — DX: Acute embolism and thrombosis of unspecified deep veins of unspecified lower extremity: I82.409

## 2020-03-20 MED ORDER — RIVAROXABAN (XARELTO) VTE STARTER PACK (15 & 20 MG): ORAL_TABLET | ORAL | 0 refills | Status: DC

## 2020-03-20 MED ORDER — RIVAROXABAN 15 MG PO TABS
15.0000 mg | ORAL_TABLET | Freq: Once | ORAL | Status: AC
  Administered 2020-03-20: 15 mg via ORAL
  Filled 2020-03-20: qty 1

## 2020-03-20 NOTE — ED Notes (Signed)
[place] results available in results review. Per Dr. Jimmye Norman, no new orders.

## 2020-03-20 NOTE — ED Triage Notes (Signed)
Pt presents to ED via POV from [place] with c/o confirmed DVT to LLE. Pt had US done earlier today and confirmed non-occlusive DVT to LLE.

## 2020-03-20 NOTE — ED Provider Notes (Signed)
[facility] Emergency Department Provider Note  ____________________________________________  Time seen: Approximately 4:03 PM  I have reviewed the triage vital signs and the nursing notes.   HISTORY  Chief Complaint DVT    HPI Cindy Stein is a 64 y.o. female who presents the emergency department for evaluation and treatment of a DVT.  Patient had been seen by orthopedics today for complaint of left lower extremity pain.  Orthopedics was concerned that the patient may have a DVT and sent her for an ultrasound.  Ultrasound reveals nonocclusive DVT of the left femoral, popliteal, posterior tibial and peroneal veins.  Symptoms began yesterday with pain, edema of the calf.  Patient states that the pain and edema is actually improved today when compared to yesterday.  No recent inciting events such as surgery, long flights.  No hormone use.  No history of DVT.  Patient does not take anticoagulation.  Patient denies any other complaints and states that she was referred to the emergency department for management of new finding of DVT.         Past Medical History:  Diagnosis Date  . Arthritis of left ankle   . Arthritis of left knee   . Hypertension   . Plantar fascial fibromatosis   . PVC (premature ventricular contraction)   . Unspecified osteoarthritis, unspecified site   . Vitamin D deficiency     Patient Active Problem List   Diagnosis Date Noted  . Hypertension   . PVC (premature ventricular contraction)   . Unstable angina [facility])     Past Surgical History:  Procedure Laterality Date  . CARDIAC CATHETERIZATION  2006  . carpel tunnel    . GALLBLADDER SURGERY    . LEFT HEART CATH AND CORONARY ANGIOGRAPHY Left 06/14/2019   Procedure: LEFT HEART CATH AND CORONARY ANGIOGRAPHY;  Surgeon: Wellington Hampshire, MD;  Location: [place] [facility];  Service: Cardiovascular;  Laterality: Left;    Prior to Admission medications   Medication Sig Start Date  End Date Taking? Authorizing Provider  carvedilol (COREG) 3.125 MG tablet Take 3.125 mg by mouth 2 (two) times daily with a meal.    [provider]  meloxicam (MOBIC) 15 MG tablet Take 1 tablet (15 mg total) by mouth daily. 11/23/18   Zahid Carneiro, Charline Bills, PA-C  olmesartan-hydrochlorothiazide (BENICAR HCT) 20-12.5 MG tablet Take 1 tablet by mouth daily.    [provider]  RIVAROXABAN Alveda Reasons) VTE STARTER PACK (15 & 20 MG TABLETS) Follow package directions: Take one 15mg  tablet by mouth twice a day. On day 22, switch to one 20mg  tablet once a day. Take with food. 03/20/20   Larenzo Caples, Charline Bills, PA-C  Turmeric Curcumin 500 MG CAPS Take 500 mg by mouth daily.     [provider]    Allergies Patient has no known allergies.  Family History  Problem Relation Age of Onset  . Asthma Mother   . Asthma Father   . Heart failure Father     Social History Social History   Tobacco Use  . Smoking status: Never Smoker  . Smokeless tobacco: Never Used  Substance Use Topics  . Alcohol use: Yes    Comment: occass  . Drug use: Never     Review of Systems  Constitutional: No fever/chills Eyes: No visual changes. No discharge ENT: No upper respiratory complaints. Cardiovascular: no chest pain. Respiratory: no cough. No SOB. Gastrointestinal: No abdominal pain.  No nausea, no vomiting.  No diarrhea.  No constipation.  Musculoskeletal: Left lower extremity pain and edema Skin: Negative for rash, abrasions, lacerations, ecchymosis. Neurological: Negative for headaches, focal weakness or numbness. 10-point ROS otherwise negative.  ____________________________________________   PHYSICAL EXAM:  VITAL SIGNS: ED Triage Vitals  Enc Vitals Group     BP 03/20/20 1355 140/79     Pulse Rate 03/20/20 1355 64     Resp 03/20/20 1355 18     Temp 03/20/20 1355 98.4 F (36.9 C)     Temp Source 03/20/20 1355 Oral     SpO2 03/20/20 1355 98 %     Weight 03/20/20 1356 195  lb (88.5 kg)     Height 03/20/20 1356 5\' 4"  (1.626 m)     Head Circumference --      Peak Flow --      Pain Score 03/20/20 1356 0     Pain Loc --      Pain Edu? --      Excl. in [place]? --      Constitutional: Alert and oriented. Well appearing and in no acute distress. Eyes: Conjunctivae are normal. PERRL. EOMI. Head: Atraumatic. ENT:      Ears:       Nose: No congestion/rhinnorhea.      Mouth/Throat: Mucous membranes are moist.  Neck: No stridor.    Cardiovascular: Normal rate, regular rhythm. Normal S1 and S2.  Good peripheral circulation. Respiratory: Normal respiratory effort without tachypnea or retractions. Lungs CTAB. Good air entry to the bases with no decreased or absent breath sounds. Musculoskeletal: Full range of motion to all extremities. No gross deformities appreciated.  Visualization of the left lower extremity reveals edema when compared with right.  This is nonpitting.  Primarily involves the calf.  Some tenderness to palpation along the calf.  No palpable lesions or cords.  Dorsalis pedis, posterior tibialis pulse intact.  Sensation grossly intact to the left lower extremity. Neurologic:  Normal speech and language. No gross focal neurologic deficits are appreciated.  Skin:  Skin is warm, dry and intact. No rash noted. Psychiatric: Mood and affect are normal. Speech and behavior are normal. Patient exhibits appropriate insight and judgement.    ____________________________________________   LABS (all labs ordered are listed, but only abnormal results are displayed)  Labs Reviewed - No data to display ____________________________________________  EKG   ____________________________________________  RADIOLOGY I personally viewed and evaluated these images as part of my medical decision making, as well as reviewing the written report by the radiologist.  US Venous Img Lower Unilateral Left (DVT)  Result Date: 03/20/2020 CLINICAL DATA:  Left lower extremity  swelling. EXAM: Left LOWER EXTREMITY VENOUS DOPPLER ULTRASOUND TECHNIQUE: Gray-scale sonography with graded compression, as well as color Doppler and duplex ultrasound were performed to evaluate the lower extremity deep venous systems from the level of the common femoral vein and including the common femoral, femoral, profunda femoral, popliteal and calf veins including the posterior tibial, peroneal and gastrocnemius veins when visible. The superficial great saphenous vein was also interrogated. Spectral Doppler was utilized to evaluate flow at rest and with distal augmentation maneuvers in the common femoral, femoral and popliteal veins. COMPARISON:  None. FINDINGS: Contralateral Common Femoral Vein: Respiratory phasicity is normal and symmetric with the symptomatic side. No evidence of thrombus. Normal compressibility. Common Femoral Vein: No evidence of thrombus. Normal compressibility, respiratory phasicity and response to augmentation. Saphenofemoral Junction: No evidence of thrombus. Normal compressibility and flow on color Doppler imaging. Profunda Femoral Vein: No evidence of thrombus. Normal compressibility and flow  on color Doppler imaging. Femoral Vein: Decreased compressibility and phasicity is noted consistent with nonocclusive thrombus. Popliteal Vein: Nonocclusive thrombus is noted. Calf Veins: Nonocclusive thrombus is noted. Superficial Great Saphenous Vein: No evidence of thrombus. Normal compressibility. Venous Reflux:  None. Other Findings:  None. IMPRESSION: Nonocclusive deep venous thrombosis is noted in the left femoral, popliteal, posterior tibial and peroneal veins. Electronically Signed   By: Marijo Conception M.D.   On: 03/20/2020 11:38    ____________________________________________    PROCEDURES  Procedure(s) performed:    Procedures    Medications  Rivaroxaban (XARELTO) tablet 15 mg (has no administration in time range)      ____________________________________________   INITIAL IMPRESSION / ASSESSMENT AND PLAN / ED COURSE  Pertinent labs & imaging results that were available during my care of the patient were reviewed by me and considered in my medical decision making (see chart for details).  Review of the [place] CSRS was performed in accordance of the [place] prior to dispensing any controlled drugs.           Patient's diagnosis is consistent with DVT.  Patient presented to emergency department for management of a newly found DVT in the left lower extremity.  Patient had presented to orthopedics complaining of left leg pain starting yesterday.  Orthopedics was concerned for DVT and patient had an outpatient ultrasound.  Ultrasound revealed nonocclusive DVT and she was sent to the emergency department for evaluation.  This was nonprovoked.  Patient will be started on Xarelto.  First dosing of the emergency department.  No indication for admission.  Follow-up with her cardiologist for further management of her DVT, follow-up with her primary care as needed.   Patient is given ED precautions to return to the ED for any worsening or new symptoms.     ____________________________________________  FINAL CLINICAL IMPRESSION(S) / ED DIAGNOSES  Final diagnoses:  Acute deep vein thrombosis (DVT) of femoral vein of left lower extremity (HCC)      NEW MEDICATIONS STARTED DURING THIS VISIT:  ED Discharge Orders         Ordered    RIVAROXABAN (XARELTO) VTE STARTER PACK (15 & 20 MG TABLETS)     03/20/20 1623              This chart was dictated using voice recognition software/Dragon. Despite best efforts to proofread, errors can occur which can change the meaning. Any change was purely unintentional.    Darletta Moll, PA-C 03/20/20 1637    Harvest Dark, MD 03/20/20 2147

## 2020-03-21 ENCOUNTER — Other Ambulatory Visit: Payer: Self-pay

## 2020-03-21 ENCOUNTER — Inpatient Hospital Stay (HOSPITAL_COMMUNITY)
Admit: 2020-03-21 | Discharge: 2020-03-21 | Disposition: A | Discharge status: Home / Self Care | Payer: Medicare Other | Attending: Internal Medicine | Admitting: Internal Medicine

## 2020-03-21 ENCOUNTER — Telehealth: Payer: Self-pay | Admitting: Cardiovascular Disease

## 2020-03-21 ENCOUNTER — Inpatient Hospital Stay
Admission: EM | Admit: 2020-03-21 | Discharge: 2020-03-23 | DRG: 164 | Disposition: A | Discharge status: Home / Self Care | Payer: Medicare Other | Attending: Internal Medicine | Admitting: Internal Medicine

## 2020-03-21 ENCOUNTER — Other Ambulatory Visit (INDEPENDENT_AMBULATORY_CARE_PROVIDER_SITE_OTHER): Payer: Self-pay | Admitting: Vascular Surgery

## 2020-03-21 ENCOUNTER — Emergency Department: Payer: Medicare Other

## 2020-03-21 ENCOUNTER — Encounter: Payer: Self-pay | Admitting: Emergency Medicine

## 2020-03-21 DIAGNOSIS — I2699 Other pulmonary embolism without acute cor pulmonale: Principal | ICD-10-CM | POA: Diagnosis present

## 2020-03-21 DIAGNOSIS — Z8249 Family history of ischemic heart disease and other diseases of the circulatory system: Secondary | ICD-10-CM | POA: Diagnosis not present

## 2020-03-21 DIAGNOSIS — I10 Essential (primary) hypertension: Secondary | ICD-10-CM | POA: Diagnosis not present

## 2020-03-21 DIAGNOSIS — R0902 Hypoxemia: Secondary | ICD-10-CM | POA: Diagnosis present

## 2020-03-21 DIAGNOSIS — Z20822 Contact with and (suspected) exposure to covid-19: Secondary | ICD-10-CM | POA: Diagnosis present

## 2020-03-21 DIAGNOSIS — K760 Fatty (change of) liver, not elsewhere classified: Secondary | ICD-10-CM | POA: Diagnosis present

## 2020-03-21 DIAGNOSIS — I82439 Acute embolism and thrombosis of unspecified popliteal vein: Secondary | ICD-10-CM | POA: Diagnosis present

## 2020-03-21 DIAGNOSIS — Z96651 Presence of right artificial knee joint: Secondary | ICD-10-CM | POA: Diagnosis present

## 2020-03-21 DIAGNOSIS — I2602 Saddle embolus of pulmonary artery with acute cor pulmonale: Secondary | ICD-10-CM

## 2020-03-21 DIAGNOSIS — R918 Other nonspecific abnormal finding of lung field: Secondary | ICD-10-CM | POA: Diagnosis present

## 2020-03-21 DIAGNOSIS — Z86718 Personal history of other venous thrombosis and embolism: Secondary | ICD-10-CM | POA: Diagnosis present

## 2020-03-21 DIAGNOSIS — I82449 Acute embolism and thrombosis of unspecified tibial vein: Secondary | ICD-10-CM | POA: Diagnosis present

## 2020-03-21 DIAGNOSIS — Z791 Long term (current) use of non-steroidal anti-inflammatories (NSAID): Secondary | ICD-10-CM

## 2020-03-21 DIAGNOSIS — Z825 Family history of asthma and other chronic lower respiratory diseases: Secondary | ICD-10-CM | POA: Diagnosis not present

## 2020-03-21 DIAGNOSIS — Z818 Family history of other mental and behavioral disorders: Secondary | ICD-10-CM | POA: Diagnosis not present

## 2020-03-21 DIAGNOSIS — Z86711 Personal history of pulmonary embolism: Secondary | ICD-10-CM | POA: Diagnosis present

## 2020-03-21 DIAGNOSIS — M19072 Primary osteoarthritis, left ankle and foot: Secondary | ICD-10-CM | POA: Diagnosis present

## 2020-03-21 DIAGNOSIS — I82402 Acute embolism and thrombosis of unspecified deep veins of left lower extremity: Secondary | ICD-10-CM | POA: Diagnosis not present

## 2020-03-21 DIAGNOSIS — M1712 Unilateral primary osteoarthritis, left knee: Secondary | ICD-10-CM | POA: Diagnosis present

## 2020-03-21 DIAGNOSIS — Z821 Family history of blindness and visual loss: Secondary | ICD-10-CM

## 2020-03-21 DIAGNOSIS — I5030 Unspecified diastolic (congestive) heart failure: Secondary | ICD-10-CM | POA: Diagnosis present

## 2020-03-21 DIAGNOSIS — Z79899 Other long term (current) drug therapy: Secondary | ICD-10-CM

## 2020-03-21 DIAGNOSIS — I5189 Other ill-defined heart diseases: Secondary | ICD-10-CM

## 2020-03-21 DIAGNOSIS — I82412 Acute embolism and thrombosis of left femoral vein: Secondary | ICD-10-CM | POA: Diagnosis present

## 2020-03-21 DIAGNOSIS — I110 Hypertensive heart disease with heart failure: Secondary | ICD-10-CM | POA: Diagnosis present

## 2020-03-21 HISTORY — DX: Other ill-defined heart diseases: I51.89

## 2020-03-21 HISTORY — DX: Other pulmonary embolism without acute cor pulmonale: I26.99

## 2020-03-21 LAB — CBC WITH DIFFERENTIAL/PLATELET
Abs Immature Granulocytes: 0.01 10*3/uL (ref 0.00–0.07)
Basophils Absolute: 0.1 10*3/uL (ref 0.0–0.1)
Basophils Relative: 1 %
Eosinophils Absolute: 0.2 10*3/uL (ref 0.0–0.5)
Eosinophils Relative: 3 %
HCT: 43 % (ref 36.0–46.0)
Hemoglobin: 14.9 g/dL (ref 12.0–15.0)
Immature Granulocytes: 0 %
Lymphocytes Relative: 31 %
Lymphs Abs: 2 10*3/uL (ref 0.7–4.0)
MCH: 29.4 pg (ref 26.0–34.0)
MCHC: 34.7 g/dL (ref 30.0–36.0)
MCV: 84.8 fL (ref 80.0–100.0)
Monocytes Absolute: 0.6 10*3/uL (ref 0.1–1.0)
Monocytes Relative: 10 %
Neutro Abs: 3.6 10*3/uL (ref 1.7–7.7)
Neutrophils Relative %: 55 %
Platelets: 183 10*3/uL (ref 150–400)
RBC: 5.07 MIL/uL (ref 3.87–5.11)
RDW: 13.5 % (ref 11.5–15.5)
WBC: 6.5 10*3/uL (ref 4.0–10.5)
nRBC: 0 % (ref 0.0–0.2)

## 2020-03-21 LAB — COMPREHENSIVE METABOLIC PANEL
ALT: 30 U/L (ref 0–44)
AST: 24 U/L (ref 15–41)
Albumin: 3.9 g/dL (ref 3.5–5.0)
Alkaline Phosphatase: 78 U/L (ref 38–126)
Anion gap: 8 (ref 5–15)
BUN: 20 mg/dL (ref 8–23)
CO2: 25 mmol/L (ref 22–32)
Calcium: 9.4 mg/dL (ref 8.9–10.3)
Chloride: 104 mmol/L (ref 98–111)
Creatinine, Ser: 0.77 mg/dL (ref 0.44–1.00)
GFR calc Af Amer: >60 mL/min (ref >60)
GFR calc non Af Amer: >60 mL/min (ref >60)
Glucose, Bld: 120 mg/dL — ABNORMAL HIGH (ref 70–99)
Potassium: 3.9 mmol/L (ref 3.5–5.1)
Sodium: 137 mmol/L (ref 135–145)
Total Bilirubin: 1.1 mg/dL (ref 0.3–1.2)
Total Protein: 7.1 g/dL (ref 6.5–8.1)

## 2020-03-21 LAB — RESPIRATORY PANEL BY RT PCR (FLU A&B, COVID)
Influenza A by PCR: NEGATIVE
Influenza B by PCR: NEGATIVE
SARS Coronavirus 2 by RT PCR: NEGATIVE

## 2020-03-21 LAB — TROPONIN I (HIGH SENSITIVITY)
Troponin I (High Sensitivity): 6 ng/L (ref <18)
Troponin I (High Sensitivity): 6 ng/L (ref <18)

## 2020-03-21 LAB — HEPARIN LEVEL (UNFRACTIONATED): Heparin Unfractionated: 2.86 IU/mL — ABNORMAL HIGH (ref 0.30–0.70)

## 2020-03-21 LAB — PROTIME-INR
INR: 1.3 — ABNORMAL HIGH (ref 0.8–1.2)
Prothrombin Time: 16.1 seconds — ABNORMAL HIGH (ref 11.4–15.2)

## 2020-03-21 LAB — LIPASE, BLOOD: Lipase: 32 U/L (ref 11–51)

## 2020-03-21 LAB — APTT
aPTT: 31 seconds (ref 24–36)
aPTT: 101 seconds — ABNORMAL HIGH (ref 24–36)

## 2020-03-21 MED ORDER — ACETAMINOPHEN 650 MG RE SUPP: 650.0000 mg | Freq: Four times a day (QID) | RECTAL | Status: DC | PRN

## 2020-03-21 MED ORDER — HEPARIN (PORCINE) 25000 UT/250ML-% IV SOLN
900.0000 [IU]/h | INTRAVENOUS | Status: DC
  Administered 2020-03-21: 1250 [IU]/h via INTRAVENOUS
  Filled 2020-03-21 (×2): qty 250

## 2020-03-21 MED ORDER — ONDANSETRON HCL 4 MG PO TABS: 4.0000 mg | ORAL_TABLET | Freq: Four times a day (QID) | ORAL | Status: DC | PRN

## 2020-03-21 MED ORDER — ONDANSETRON HCL 4 MG/2ML IJ SOLN
4.0000 mg | Freq: Four times a day (QID) | INTRAMUSCULAR | Status: DC | PRN
  Administered 2020-03-22: 4 mg via INTRAVENOUS
  Filled 2020-03-21: qty 2

## 2020-03-21 MED ORDER — HEPARIN BOLUS VIA INFUSION
4500.0000 [IU] | Freq: Once | INTRAVENOUS | Status: AC
  Administered 2020-03-21: 4500 [IU] via INTRAVENOUS
  Filled 2020-03-21: qty 4500

## 2020-03-21 MED ORDER — SODIUM CHLORIDE 0.9 % IV SOLN: INTRAVENOUS | Status: DC

## 2020-03-21 MED ORDER — ACETAMINOPHEN 325 MG PO TABS
650.0000 mg | ORAL_TABLET | Freq: Four times a day (QID) | ORAL | Status: DC | PRN
  Administered 2020-03-23: 650 mg via ORAL
  Filled 2020-03-21: qty 2

## 2020-03-21 MED ORDER — IOHEXOL 350 MG/ML SOLN
75.0000 mL | Freq: Once | INTRAVENOUS | Status: AC | PRN
  Administered 2020-03-21: 75 mL via INTRAVENOUS

## 2020-03-21 NOTE — ED Provider Notes (Signed)
[facility] Emergency Department Provider Note  ____________________________________________  Time seen: Approximately 1:17 PM  I have reviewed the triage vital signs and the nursing notes.   HISTORY  Chief Complaint Weakness, Shortness of Breath, and Leg Pain    HPI Cindy Stein is a 64 y.o. female with a history of hypertension who comes the ED complaining of shortness of breath for the past week.  Symptoms are constant, worse with any walking at home.  Associated with chest pressure that is nonradiating.  Somewhat alleviated by being at rest.  Rates her shortness of breath as moderate at rest and severe with walking.  She was seen at urgent care yesterday, had ultrasound of her leg which showed nonocclusive DVT.  She came to the ED for evaluation and was started on Xarelto and discharged home.  Notes from yesterday and ultrasound from yesterday reviewed showing a nonocclusive DVT finding, and normal vital signs yesterday and apparently no symptoms to raise suspicion of PE reported yesterday.  Today she continued feeling worse and came back to the ED at the urging of her cardiologist clinic.     Past Medical History:  Diagnosis Date  . Arthritis of left ankle   . Arthritis of left knee   . Hypertension   . Plantar fascial fibromatosis   . PVC (premature ventricular contraction)   . Unspecified osteoarthritis, unspecified site   . Vitamin D deficiency      Patient Active Problem List   Diagnosis Date Noted  . Hypertension   . PVC (premature ventricular contraction)   . Unstable angina [facility])      Past Surgical History:  Procedure Laterality Date  . CARDIAC CATHETERIZATION  2006  . carpel tunnel    . GALLBLADDER SURGERY    . LEFT HEART CATH AND CORONARY ANGIOGRAPHY Left 06/14/2019   Procedure: LEFT HEART CATH AND CORONARY ANGIOGRAPHY;  Surgeon: Wellington Hampshire, MD;  Location: [place] [facility];  Service: Cardiovascular;  Laterality:  Left;     Prior to Admission medications   Medication Sig Start Date End Date Taking? Authorizing Provider  meloxicam (MOBIC) 15 MG tablet Take 1 tablet (15 mg total) by mouth daily. 11/23/18  Yes Cuthriell, Charline Bills, PA-C  olmesartan-hydrochlorothiazide (BENICAR HCT) 20-12.5 MG tablet Take 1 tablet by mouth daily.   Yes [provider]  RIVAROXABAN Alveda Reasons) VTE STARTER PACK (15 & 20 MG TABLETS) Follow package directions: Take one 15mg  tablet by mouth twice a day. On day 22, switch to one 20mg  tablet once a day. Take with food. 03/20/20  Yes Cuthriell, Charline Bills, PA-C     Allergies Patient has no known allergies.   Family History  Problem Relation Age of Onset  . Asthma Mother   . Asthma Father   . Heart failure Father     Social History Social History   Tobacco Use  . Smoking status: Never Smoker  . Smokeless tobacco: Never Used  Substance Use Topics  . Alcohol use: Yes    Comment: occass  . Drug use: Never    Review of Systems  Constitutional:   No fever or chills.  ENT:   No sore throat. No rhinorrhea. Cardiovascular: Positive chest pressure as above without syncope. Respiratory: Positive shortness of breath without cough. Gastrointestinal:   Negative for abdominal pain, vomiting and diarrhea.  Musculoskeletal: Positive left leg pain All other systems reviewed and are negative except as documented above in ROS and HPI.  ____________________________________________   PHYSICAL EXAM:  VITAL SIGNS: ED Triage Vitals  Enc Vitals Group     BP 03/21/20 0951 111/81     Pulse Rate 03/21/20 0951 80     Resp 03/21/20 0951 20     Temp 03/21/20 0951 98.7 F (37.1 C)     Temp Source 03/21/20 0951 Oral     SpO2 03/21/20 0951 99 %     Weight 03/21/20 0945 200 lb (90.7 kg)     Height 03/21/20 0945 5\' 4"  (1.626 m)     Head Circumference --      Peak Flow --      Pain Score 03/21/20 0945 5     Pain Loc --      Pain Edu? --      Excl. in [place]? --      Vital signs reviewed, nursing assessments reviewed.   Constitutional:   Alert and oriented. Non-toxic appearance. Eyes:   Conjunctivae are normal. EOMI. PERRL. ENT      Head:   Normocephalic and atraumatic.      Nose:   Wearing a mask.      Mouth/Throat:   Wearing a mask.      Neck:   No meningismus. Full ROM. Hematological/Lymphatic/Immunilogical:   No cervical lymphadenopathy. Cardiovascular:   RRR. Symmetric bilateral radial and DP pulses.  No murmurs. Cap refill less than 2 seconds. Respiratory:   Normal respiratory effort without tachypnea/retractions. Breath sounds are clear and equal bilaterally. No wheezes/rales/rhonchi. Gastrointestinal:   Soft and nontender. Non distended. There is no CVA tenderness.  No rebound, rigidity, or guarding.  Musculoskeletal:   Normal range of motion in all extremities. No joint effusions.  No lower extremity tenderness.  No edema.  Compartments are soft Neurologic:   Normal speech and language.  Motor grossly intact. No acute focal neurologic deficits are appreciated.  Skin:    Skin is warm, dry and intact. No rash noted.  No petechiae, purpura, or bullae.  ____________________________________________    LABS (pertinent positives/negatives) (all labs ordered are listed, but only abnormal results are displayed) Labs Reviewed  COMPREHENSIVE METABOLIC PANEL - Abnormal; Notable for the following components:      Result Value   Glucose, Bld 120 (*)    All other components within normal limits  PROTIME-INR - Abnormal; Notable for the following components:   Prothrombin Time 16.1 (*)    INR 1.3 (*)    All other components within normal limits  RESPIRATORY PANEL BY RT PCR (FLU A&B, COVID)  CBC WITH DIFFERENTIAL/PLATELET  LIPASE, BLOOD  APTT  HEPARIN LEVEL (UNFRACTIONATED)  TROPONIN I (HIGH SENSITIVITY)  TROPONIN I (HIGH SENSITIVITY)   ____________________________________________   EKG  Interpreted by me Normal sinus rhythm rate of  65, normal axis and intervals.  Normal QRS ST segments and T waves.  There is an S1Q3T3 pattern.  ____________________________________________    RADIOLOGY  DG Chest 2 View  Result Date: 03/21/2020 CLINICAL DATA:  Shortness of breath. Left lower extremity deep venous thrombosis. EXAM: CHEST - 2 VIEW COMPARISON:  Chest x-ray dated 11/23/2018 FINDINGS: The heart size and pulmonary vascularity are normal and the lungs are clear. No effusions. No bone abnormality. IMPRESSION: No active cardiopulmonary disease. Electronically Signed   By: Lorriane Shire M.D.   On: 03/21/2020 10:38    ____________________________________________   PROCEDURES .Critical Care Performed by: Carrie Mew, MD Authorized by: Carrie Mew, MD   Critical care provider statement:    Critical care time (minutes):  35   Critical care time  was exclusive of:  Separately billable procedures and treating other patients   Critical care was necessary to treat or prevent imminent or life-threatening deterioration of the following conditions:  Cardiac failure, respiratory failure and circulatory failure   Critical care was time spent personally by me on the following activities:  Development of treatment plan with patient or surrogate, discussions with consultants, evaluation of patient's response to treatment, examination of patient, obtaining history from patient or surrogate, ordering and performing treatments and interventions, ordering and review of laboratory studies, ordering and review of radiographic studies, pulse oximetry, re-evaluation of patient's condition and review of old charts    ____________________________________________  DIFFERENTIAL DIAGNOSIS   Pulmonary embolism, pneumonia, pulmonary edema, Covid  CLINICAL IMPRESSION / ASSESSMENT AND PLAN / ED COURSE  Medications ordered in the ED: Medications  heparin ADULT infusion 100 units/mL (25000 units/249mL sodium chloride 0.45%) (has no  administration in time range)  heparin bolus via infusion 4,500 Units (has no administration in time range)  iohexol (OMNIPAQUE) 350 MG/ML injection 75 mL (75 mLs Intravenous Contrast Given 03/21/20 1059)    Pertinent labs & imaging results that were available during my care of the patient were reviewed by me and considered in my medical decision making (see chart for details).  Cindy Stein was evaluated in Emergency Department on 03/21/2020 for the symptoms described in the history of present illness. She was evaluated in the context of the global COVID-19 pandemic, which necessitated consideration that the patient might be at risk for infection with the SARS-CoV-2 virus that causes COVID-19. Institutional protocols and algorithms that pertain to the evaluation of patients at risk for COVID-19 are in a state of rapid change based on information released by regulatory bodies including the CDC and federal and state organizations. These policies and algorithms were followed during the patient's care in the ED.   Patient presents with dyspnea on exertion, shortness of breath over the past week with known DVT in the left leg.  CT scan of the chest ordered on arrival to emergency department to evaluate for PE.  Received a call from radiology at 11:37 AM, radiologist reporting bilateral PEs with evidence of right heart strain.  Heparin infusion and Covid screening ordered.  Clinical Course as of Mar 21 1316  Tue Mar 21, 2020  1247 CT impression:  IMPRESSION: 1. Bilateral pulmonary arterial emboli, left greater than right. 2. RV to LV ratio of 1.4, no pericardial effusion. Large volume of pulmonary emboli in the lungs bilaterally, with dilatation of the right atrium and right ventricle (RV to LV ratio of 1.4) indicative of elevated right-sided heart pressures and right heart strain. These findings have been shown to be associated with a increased morbidity and mortality in the setting pulmonary  embolism. 3. Numerous bilateral pulmonary nodules, measuring up to 7 mm. Given history of thromboembolism neoplasm is considered. By [place] guidelines with no history of neoplasm a 3 to six-month follow-up would be helpful with further follow-up based on the appearance of these nodules. 4. Hepatic steatosis. Post cholecystectomy.   [PS]  1316 Case discussed with vascular surgery who will come evaluate the patient.   [PS]    Clinical Course User Index [PS] Carrie Mew, MD    ----------------------------------------- 1:22 PM on 03/21/2020 -----------------------------------------  Case discussed with hospitalist.   ____________________________________________   FINAL CLINICAL IMPRESSION(S) / ED DIAGNOSES    Final diagnoses:  Bilateral pulmonary embolism [facility])     ED Discharge Orders    None  Portions of this note were generated with dragon dictation software. Dictation errors may occur despite best attempts at proofreading.   Carrie Mew, MD 03/21/20 1322

## 2020-03-21 NOTE — Telephone Encounter (Signed)
Pt c/o Shortness Of Breath: STAT if SOB developed within the last 24 hours or pt is noticeably SOB on the phone  1. Are you currently SOB (can you hear that pt is SOB on the phone)? yes  2. How long have you been experiencing SOB? Last 2 days 3. Are you SOB when sitting or when up moving around? Sitting and up moving around  4. Are you currently experiencing any other symptoms?  Arms feel heavy Patient is also experiencing tingling in her fingers.  Patient was seen in the ED

## 2020-03-21 NOTE — ED Notes (Signed)
Pt ambulatory to bathroom without assistance.

## 2020-03-21 NOTE — H&P (Addendum)
History and Physical    Cindy Stein [id number] DOB: 04/21/1956 DOA: 03/21/2020  PCP: Cindy Market, MD   Patient coming from: Home  I have personally briefly reviewed patient's old medical records in [place]  Chief Complaint: Shortness of breath  HPI: Cindy Stein is a 64 y.o. female with medical history significant for hypertension who comes into the emergency room for evaluation of shortness of breath for 1 week.  Symptoms are constant and worse with exertion.  Shortness of breath is also associated with chest pressure that is nonradiating and is relieved at rest. Patient was seen at urgent care 1 day prior to admission and had an ultrasound of her left left leg which showed a nonocclusive DVT.  She was seen in the emergency room and was discharged home on Xarelto.  Patient returned to the emergency room today because she had continued to feel worse without any improvement in her symptoms, she had a CT angiogram of the chest done which showed bilateral PE (Lt > Rt) with evidence of right heart strain. Numerous bilateral pulmonary nodules, measuring up to 7 mm. Given history of thromboembolism neoplasm is considered. Patient had a right total knee arthroplasty done in December She has been started on a heparin drip and will be admitted to the hospital for further evaluation.  ED Course: Patient who presents to the emergency room for evaluation of dyspnea on exertion and chest pressure for over 1 week and recently diagnosed with a DVT in the left leg.  CT angiogram of the chest was done which showed bilateral pulmonary arterial emboli, left greater than right with evidence of right heart strain. Patient was started on a heparin drip and vascular surgery has been consulted.  Review of Systems: As per HPI otherwise 10 point review of systems negative.    Past Medical History:  Diagnosis Date  . Arthritis of left ankle   . Arthritis of left knee   . Hypertension   .  Plantar fascial fibromatosis   . PVC (premature ventricular contraction)   . Unspecified osteoarthritis, unspecified site   . Vitamin D deficiency     Past Surgical History:  Procedure Laterality Date  . CARDIAC CATHETERIZATION  2006  . carpel tunnel    . GALLBLADDER SURGERY    . LEFT HEART CATH AND CORONARY ANGIOGRAPHY Left 06/14/2019   Procedure: LEFT HEART CATH AND CORONARY ANGIOGRAPHY;  Surgeon: Wellington Hampshire, MD;  Location: [facility];  Service: Cardiovascular;  Laterality: Left;     reports that she has never smoked. She has never used smokeless tobacco. She reports current alcohol use. She reports that she does not use drugs.  No Known Allergies  Family History  Problem Relation Age of Onset  . Asthma Mother   . Asthma Father   . Heart failure Father      Prior to Admission medications   Medication Sig Start Date End Date Taking? Authorizing Provider  meloxicam (MOBIC) 15 MG tablet Take 1 tablet (15 mg total) by mouth daily. 11/23/18  Yes Cuthriell, Charline Bills, PA-C  olmesartan-hydrochlorothiazide (BENICAR HCT) 20-12.5 MG tablet Take 1 tablet by mouth daily.   Yes [provider]  RIVAROXABAN Alveda Reasons) VTE STARTER PACK (15 & 20 MG TABLETS) Follow package directions: Take one 15mg  tablet by mouth twice a day. On day 22, switch to one 20mg  tablet once a day. Take with food. 03/20/20  Yes Cuthriell, Charline Bills, PA-C    Physical Exam: Vitals:   03/21/20  0945 03/21/20 0951  BP:  111/81  Pulse:  80  Resp:  20  Temp:  98.7 F (37.1 C)  TempSrc:  Oral  SpO2:  99%  Weight: 90.7 kg   Height: 5\' 4"  (1.626 m)      Vitals:   03/21/20 0945 03/21/20 0951  BP:  111/81  Pulse:  80  Resp:  20  Temp:  98.7 F (37.1 C)  TempSrc:  Oral  SpO2:  99%  Weight: 90.7 kg   Height: 5\' 4"  (1.626 m)     Constitutional: NAD, alert and oriented x 3 Eyes: PERRL, lids and conjunctivae normal ENMT: Mucous membranes are moist.  Neck: normal, supple, no masses,  no thyromegaly Respiratory: clear to auscultation bilaterally, no wheezing, no crackles. Normal respiratory effort. No accessory muscle use.  Cardiovascular: Regular rate and rhythm, no murmurs / rubs / gallops. No extremity edema. 2+ pedal pulses. No carotid bruits.  Abdomen: no tenderness, no masses palpated. No hepatosplenomegaly. Bowel sounds positive.  Musculoskeletal: no clubbing / cyanosis. No joint deformity upper and lower extremities. Lt calf > Rt calf Skin: no rashes, lesions, ulcers.  Neurologic: No gross focal neurologic deficit. Psychiatric: Normal mood and affect.   Labs on Admission: I have personally reviewed following labs and imaging studies  CBC: Recent Labs  Lab 03/21/20 0948  WBC 6.5  NEUTROABS 3.6  HGB 14.9  HCT 43.0  MCV 84.8  PLT [id number]   Basic Metabolic Panel: Recent Labs  Lab 03/21/20 0948  NA 137  K 3.9  CL 104  CO2 25  GLUCOSE 120*  BUN 20  CREATININE 0.77  CALCIUM 9.4   GFR: Estimated Creatinine Clearance: 75.5 mL/min (by C-G formula based on SCr of 0.77 mg/dL). Liver Function Tests: Recent Labs  Lab 03/21/20 0948  AST 24  ALT 30  ALKPHOS 78  BILITOT 1.1  PROT 7.1  ALBUMIN 3.9   Recent Labs  Lab 03/21/20 0948  LIPASE 32   No results for input(s): AMMONIA in the last 168 hours. Coagulation Profile: Recent Labs  Lab 03/21/20 0948  INR 1.3*   Cardiac Enzymes: No results for input(s): CKTOTAL, CKMB, CKMBINDEX, TROPONINI in the last 168 hours. BNP (last 3 results) No results for input(s): PROBNP in the last 8760 hours. HbA1C: No results for input(s): HGBA1C in the last 72 hours. CBG: No results for input(s): GLUCAP in the last 168 hours. Lipid Profile: No results for input(s): CHOL, HDL, LDLCALC, TRIG, CHOLHDL, LDLDIRECT in the last 72 hours. Thyroid Function Tests: No results for input(s): TSH, T4TOTAL, FREET4, T3FREE, THYROIDAB in the last 72 hours. Anemia Panel: No results for input(s): VITAMINB12, FOLATE, FERRITIN,  TIBC, IRON, RETICCTPCT in the last 72 hours. Urine analysis: No results found for: COLORURINE, APPEARANCEUR, LABSPEC, [place], GLUCOSEU, HGBUR, BILIRUBINUR, KETONESUR, PROTEINUR, UROBILINOGEN, NITRITE, LEUKOCYTESUR  Radiological Exams on Admission: DG Chest 2 View  Result Date: 03/21/2020 CLINICAL DATA:  Shortness of breath. Left lower extremity deep venous thrombosis. EXAM: CHEST - 2 VIEW COMPARISON:  Chest x-ray dated 11/23/2018 FINDINGS: The heart size and pulmonary vascularity are normal and the lungs are clear. No effusions. No bone abnormality. IMPRESSION: No active cardiopulmonary disease. Electronically Signed   By: Lorriane Shire M.D.   On: 03/21/2020 10:38   US Venous Img Lower Unilateral Left (DVT)  Result Date: 03/20/2020 CLINICAL DATA:  Left lower extremity swelling. EXAM: Left LOWER EXTREMITY VENOUS DOPPLER ULTRASOUND TECHNIQUE: Gray-scale sonography with graded compression, as well as color Doppler and duplex ultrasound were performed to  evaluate the lower extremity deep venous systems from the level of the common femoral vein and including the common femoral, femoral, profunda femoral, popliteal and calf veins including the posterior tibial, peroneal and gastrocnemius veins when visible. The superficial great saphenous vein was also interrogated. Spectral Doppler was utilized to evaluate flow at rest and with distal augmentation maneuvers in the common femoral, femoral and popliteal veins. COMPARISON:  None. FINDINGS: Contralateral Common Femoral Vein: Respiratory phasicity is normal and symmetric with the symptomatic side. No evidence of thrombus. Normal compressibility. Common Femoral Vein: No evidence of thrombus. Normal compressibility, respiratory phasicity and response to augmentation. Saphenofemoral Junction: No evidence of thrombus. Normal compressibility and flow on color Doppler imaging. Profunda Femoral Vein: No evidence of thrombus. Normal compressibility and flow on color  Doppler imaging. Femoral Vein: Decreased compressibility and phasicity is noted consistent with nonocclusive thrombus. Popliteal Vein: Nonocclusive thrombus is noted. Calf Veins: Nonocclusive thrombus is noted. Superficial Great Saphenous Vein: No evidence of thrombus. Normal compressibility. Venous Reflux:  None. Other Findings:  None. IMPRESSION: Nonocclusive deep venous thrombosis is noted in the left femoral, popliteal, posterior tibial and peroneal veins. Electronically Signed   By: Marijo Conception M.D.   On: 03/20/2020 11:38    EKG: Independently reviewed.  Normal sinus rhythm     Assessment/Plan Active Problems:   Hypertension   Pulmonary embolus (HCC)   Left leg DVT (HCC)    Acute pulmonary embolism (POA) Patient was diagnosed with a left lower extremity DVT on 03/20/20 and had a CT angiogram of the chest done due to worsening shortness of breath and chest pain with exertion which shows bilateral PE with RV strain Continue heparin drip 2D echocardiogram to assess LVEF Vascular surgery consult   Hypertension Hold all antihypertensive medications     DVT prophylaxis: Heparin Code Status: Full Family Communication: Greater than 50% of time was spent discussing patient's condition and plan of care at the bedside all questions and concerns have been addressed  Disposition Plan: Back to previous home environment Consults called: Vascular surgery    Joeleen Wortley MD [facility]     03/21/2020, 1:56 PM

## 2020-03-21 NOTE — Telephone Encounter (Signed)
Great catch. You probably saved her life.

## 2020-03-21 NOTE — Consult Note (Signed)
[name] Vascular Consult Note  MRN : [id number]  Cindy Stein is a 64 y.o. (22-Mar-1956) female who presents with chief complaint of  Chief Complaint  Patient presents with  . Weakness  . Shortness of Breath  . Leg Pain   History of Present Illness:  The patient is a 63 year old female with a past medical history of hypertension who presented [facility]'s emergency department complaining of progressively worsening shortness of breath.  Patient endorses swelling of her left lower extremity which started a few days ago.  Patient was seen noted in urgent care center and diagnosed with a nonocclusive DVT of the left lower extremity.  Staff at the urgent care center recommended she seek medical attention in our emergency department.  The patient was started on Xarelto.  The patient return back to the emergency department today with progressively worsening shortness of breath.  The patient underwent a CTA of the chest and was found to have:  1. Bilateral pulmonary arterial emboli, left greater than right. 2. RV to LV ratio of 1.4, no pericardial effusion. Large volume of pulmonary emboli in the lungs bilaterally, with dilatation of the right atrium and right ventricle (RV to LV ratio of 1.4) indicative of elevated right-sided heart pressures and right heart strain. These findings have been shown to be associated with a increased morbidity and mortality in the setting pulmonary embolism. 3. Numerous bilateral pulmonary nodules, measuring up to 7 mm. Given history of thromboembolism neoplasm is considered. By [place] guidelines with no history of neoplasm a 3 to six-month follow-up would be helpful with further follow-up based on the appearance of these nodules. 4. Hepatic steatosis. Post cholecystectomy.  Patient notes that her shortness of breath worsens with exertion.  There is some associated chest pain.  She notes that her left  lower extremity swelling is better today.  Denies any left lower extremity pain.  She denies any history of DVT or PE in the past.  She denies any recent surgery or trauma, prolonged immobility, or known bleeding/clotting disorder.  Vascular surgery was consulted by Dr. Joni Fears for possible endovascular intervention.  Current Facility-Administered Medications  Medication Dose Route Frequency Provider Last Rate Last Admin  . acetaminophen (TYLENOL) tablet 650 mg  650 mg Oral Q6H PRN Agbata, Tochukwu, MD       Or  . acetaminophen (TYLENOL) suppository 650 mg  650 mg Rectal Q6H PRN Agbata, Tochukwu, MD      . heparin ADULT infusion 100 units/mL (25000 units/229mL sodium chloride 0.45%)  1,250 Units/hr Intravenous Continuous Nazari, Walid A, RPH 12.5 mL/hr at 03/21/20 1329 1,250 Units/hr at 03/21/20 1329  . ondansetron (ZOFRAN) tablet 4 mg  4 mg Oral Q6H PRN Agbata, Tochukwu, MD       Or  . ondansetron (ZOFRAN) injection 4 mg  4 mg Intravenous Q6H PRN Agbata, Tochukwu, MD       Current Outpatient Medications  Medication Sig Dispense Refill  . meloxicam (MOBIC) 15 MG tablet Take 1 tablet (15 mg total) by mouth daily. 30 tablet 0  . olmesartan-hydrochlorothiazide (BENICAR HCT) 20-12.5 MG tablet Take 1 tablet by mouth daily.    Marland Kitchen RIVAROXABAN (XARELTO) VTE STARTER PACK (15 & 20 MG TABLETS) Follow package directions: Take one 15mg  tablet by mouth twice a day. On day 22, switch to one 20mg  tablet once a day. Take with food. 4 each 0   Past Medical History:  Diagnosis Date  . Arthritis of left ankle   .  Arthritis of left knee   . Hypertension   . Plantar fascial fibromatosis   . Pulmonary embolus ([place]) 03/21/2020  . PVC (premature ventricular contraction)   . Unspecified osteoarthritis, unspecified site   . Vitamin D deficiency    Past Surgical History:  Procedure Laterality Date  . CARDIAC CATHETERIZATION  2006  . carpel tunnel    . GALLBLADDER SURGERY    . LEFT HEART CATH AND CORONARY  ANGIOGRAPHY Left 06/14/2019   Procedure: LEFT HEART CATH AND CORONARY ANGIOGRAPHY;  Surgeon: Wellington Hampshire, MD;  Location: [facility];  Service: Cardiovascular;  Laterality: Left;   Social History Social History   Tobacco Use  . Smoking status: Never Smoker  . Smokeless tobacco: Never Used  Substance Use Topics  . Alcohol use: Yes    Comment: occass  . Drug use: Never   Family History Family History  Problem Relation Age of Onset  . Asthma Mother   . Asthma Father   . Heart failure Father   Denies any family history of peripheral artery disease, venous disease or bleeding/clotting disorders.  No Known Allergies  REVIEW OF SYSTEMS (Negative unless checked)  Constitutional: [] Weight loss  [] Fever  [] Chills Cardiac: [] Chest pain   [x] Chest pressure   [] Palpitations   [x] Shortness of breath when laying flat   [x] Shortness of breath at rest   [x] Shortness of breath with exertion. Vascular:  [] Pain in legs with walking   [] Pain in legs at rest   [] Pain in legs when laying flat   [] Claudication   [] Pain in feet when walking  [] Pain in feet at rest  [] Pain in feet when laying flat   [] History of DVT   [] Phlebitis   [] Swelling in legs   [] Varicose veins   [] Non-healing ulcers Pulmonary:   [] Uses home oxygen   [] Productive cough   [] Hemoptysis   [] Wheeze  [] COPD   [] Asthma Neurologic:  [] Dizziness  [] Blackouts   [] Seizures   [] History of stroke   [] History of TIA  [] Aphasia   [] Temporary blindness   [] Dysphagia   [] Weakness or numbness in arms   [] Weakness or numbness in legs Musculoskeletal:  [] Arthritis   [] Joint swelling   [] Joint pain   [] Low back pain Hematologic:  [] Easy bruising  [] Easy bleeding   [] Hypercoagulable state   [] Anemic  [] Hepatitis Gastrointestinal:  [] Blood in stool   [] Vomiting blood  [] Gastroesophageal reflux/heartburn   [] Difficulty swallowing. Genitourinary:  [] Chronic kidney disease   [] Difficult urination  [] Frequent urination  [] Burning with urination    [] Blood in urine Skin:  [] Rashes   [] Ulcers   [] Wounds Psychological:  [] History of anxiety   []  History of major depression.  Physical Examination  Vitals:   03/21/20 0945 03/21/20 0951 03/21/20 1510  BP:  111/81 95/74  Pulse:  80 84  Resp:  20 13  Temp:  98.7 F (37.1 C)   TempSrc:  Oral   SpO2:  99% 97%  Weight: 90.7 kg    Height: 5\' 4"  (1.626 m)     Body mass index is 34.33 kg/m. Gen:  WD/WN, NAD  Head: [place]/AT, No temporalis wasting. Prominent temp pulse not noted. Ear/Nose/Throat: Hearing grossly intact, nares w/o erythema or drainage, oropharynx w/o Erythema/Exudate Eyes: Sclera non-icteric, conjunctiva clear Neck: Trachea midline.  No JVD.  Pulmonary:  Good air movement, respirations not labored, equal bilaterally.  Cardiac: RRR, normal S1, S2. Vascular:  Vessel Right Left  Radial Palpable Palpable  Ulnar Palpable Palpable  Brachial Palpable Palpable  Carotid Palpable, without bruit Palpable, without bruit  Aorta Not palpable N/A  Femoral Palpable Palpable  Popliteal Palpable Palpable  PT Palpable Palpable  DP Palpable Palpable   Left lower extremity: Thigh soft.  Calf soft.  There is no acute vascular, rest of the extremity at this time.  Mild edema.  Gastrointestinal: soft, non-tender/non-distended. No guarding/reflex.  Musculoskeletal: M/S 5/5 throughout.  Extremities without ischemic changes.  No deformity or atrophy.  Neurologic: Sensation grossly intact in extremities.  Symmetrical.  Speech is fluent. Motor exam as listed above. Psychiatric: Judgment intact, Mood & affect appropriate for pt's clinical situation. Dermatologic: No rashes or ulcers noted.  No cellulitis or open wounds. Lymph : No Cervical, Axillary, or Inguinal lymphadenopathy.  CBC Lab Results  Component Value Date   WBC 6.5 03/21/2020   HGB 14.9 03/21/2020   HCT 43.0 03/21/2020   MCV 84.8 03/21/2020   PLT 183 03/21/2020   BMET    Component Value Date/Time   NA 137 03/21/2020  0948   NA 143 06/29/2019 0943   K 3.9 03/21/2020 0948   CL 104 03/21/2020 0948   CO2 25 03/21/2020 0948   GLUCOSE 120 (H) 03/21/2020 0948   BUN 20 03/21/2020 0948   BUN 29 (H) 06/29/2019 0943   CREATININE 0.77 03/21/2020 0948   CALCIUM 9.4 03/21/2020 0948   GFRNONAA >60 03/21/2020 0948   GFRAA >60 03/21/2020 0948   Estimated Creatinine Clearance: 75.5 mL/min (by C-G formula based on SCr of 0.77 mg/dL).  COAG Lab Results  Component Value Date   INR 1.3 (H) 03/21/2020   Radiology DG Chest 2 View  Result Date: 03/21/2020 CLINICAL DATA:  Shortness of breath. Left lower extremity deep venous thrombosis. EXAM: CHEST - 2 VIEW COMPARISON:  Chest x-ray dated 11/23/2018 FINDINGS: The heart size and pulmonary vascularity are normal and the lungs are clear. No effusions. No bone abnormality. IMPRESSION: No active cardiopulmonary disease. Electronically Signed   By: Lorriane Shire M.D.   On: 03/21/2020 10:38   CT Angio Chest PE W and/or Wo Contrast  Result Date: 03/21/2020 CLINICAL DATA:  PE suspected. History of DVT. EXAM: CT ANGIOGRAPHY CHEST WITH CONTRAST TECHNIQUE: Multidetector CT imaging of the chest was performed using the standard protocol during bolus administration of intravenous contrast. Multiplanar CT image reconstructions and MIPs were obtained to evaluate the vascular anatomy. CONTRAST:  47mL OMNIPAQUE IOHEXOL 350 MG/ML SOLN COMPARISON:  Chest x-ray of the same date. FINDINGS: Cardiovascular: Left and right pulmonary arterial emboli. The thrombus bridging upper and lower lobe branches on the left. The thrombus in the right pulmonary artery at the bifurcation extending into right lower lobe. The clot burden greatest on the left. RV to LV ratio of 1.4, no pericardial effusion. Aortic caliber is normal. Mediastinum/Nodes: Thoracic inlet structures are normal. No axillary lymphadenopathy. Lungs/Pleura: Small pulmonary nodules right upper lobe (image 28, series 6) between largest  approximately 3-4 mm. Numerous nodules are scattered about the chest bilaterally. All areas of the lung are involved. (Image 24, series 6): 3 mm right upper lobe nodule. At least 7 nodules are seen in the right upper lobe. (Image 52, series 6): 4-5 mm left lower lobe pulmonary nodule. Small pulmonary nodules also seen in the left upper lobe. Juxtapleural nodule at the right lung base measures 7 mm. (Image 49, series 6) Airways are patent. Upper Abdomen: Hepatic steatosis. Post cholecystectomy. Musculoskeletal: No acute musculoskeletal process. Review of the MIP images confirms the above findings. IMPRESSION: 1. Bilateral pulmonary arterial emboli, left  greater than right. 2. RV to LV ratio of 1.4, no pericardial effusion. Large volume of pulmonary emboli in the lungs bilaterally, with dilatation of the right atrium and right ventricle (RV to LV ratio of 1.4) indicative of elevated right-sided heart pressures and right heart strain. These findings have been shown to be associated with a increased morbidity and mortality in the setting pulmonary embolism. 3. Numerous bilateral pulmonary nodules, measuring up to 7 mm. Given history of thromboembolism neoplasm is considered. By [place] guidelines with no history of neoplasm a 3 to six-month follow-up would be helpful with further follow-up based on the appearance of these nodules. 4. Hepatic steatosis. Post cholecystectomy. Electronically Signed   By: Zetta Bills M.D.   On: 03/21/2020 11:52   US Venous Img Lower Unilateral Left (DVT)  Result Date: 03/20/2020 CLINICAL DATA:  Left lower extremity swelling. EXAM: Left LOWER EXTREMITY VENOUS DOPPLER ULTRASOUND TECHNIQUE: Gray-scale sonography with graded compression, as well as color Doppler and duplex ultrasound were performed to evaluate the lower extremity deep venous systems from the level of the common femoral vein and including the common femoral, femoral, profunda femoral, popliteal and calf veins  including the posterior tibial, peroneal and gastrocnemius veins when visible. The superficial great saphenous vein was also interrogated. Spectral Doppler was utilized to evaluate flow at rest and with distal augmentation maneuvers in the common femoral, femoral and popliteal veins. COMPARISON:  None. FINDINGS: Contralateral Common Femoral Vein: Respiratory phasicity is normal and symmetric with the symptomatic side. No evidence of thrombus. Normal compressibility. Common Femoral Vein: No evidence of thrombus. Normal compressibility, respiratory phasicity and response to augmentation. Saphenofemoral Junction: No evidence of thrombus. Normal compressibility and flow on color Doppler imaging. Profunda Femoral Vein: No evidence of thrombus. Normal compressibility and flow on color Doppler imaging. Femoral Vein: Decreased compressibility and phasicity is noted consistent with nonocclusive thrombus. Popliteal Vein: Nonocclusive thrombus is noted. Calf Veins: Nonocclusive thrombus is noted. Superficial Great Saphenous Vein: No evidence of thrombus. Normal compressibility. Venous Reflux:  None. Other Findings:  None. IMPRESSION: Nonocclusive deep venous thrombosis is noted in the left femoral, popliteal, posterior tibial and peroneal veins. Electronically Signed   By: Marijo Conception M.D.   On: 03/20/2020 11:38   Assessment/Plan The patient is a 64 year old female with a past medical history of hypertension who presented [facility]'s emergency department complaining of progressively worsening shortness of breath found to have a left lower extremity nonocclusive DVT and bilateral pulmonary embolus with right heart strain  1. Bilateral pulmonary arterial emboli, left greater than right, with right heart strain: Patient presents to the [facility] emergency department today with progressively worsening shortness of breath.  Patient found to have bilateral pulmonary embolus  left greater than right with right heart strain.  Patient's shortness of breath worsened significantly with ambulation.  This is what prompted her to seek medical attention.  Agree with the initiation of heparin.  In the setting of bilateral pulmonary embolus with right heart strain, significant shortness of breath recommend undergoing a pulmonary thrombectomy/pulmonary lysis and attempt to reduce the clot burden improving the patient's right heart strain and symptoms.  Procedure, risks and benefits of pain to the patient.  All questions answered.  The patient wishes to proceed.  We will plan on this tomorrow with Dr. Lucky Cowboy.  2.  Left lower extremity DVT: Patient notes the swelling to her left lower extremity improved this a.m.  There is no pain.  Venous duplex with  nonocclusive DVT.  Agree with the initiation of heparin.  At this time, no endovascular intervention is indicated.  Recommend elevation.  3.  Need for anticoagulation: Patient understands that she will be on oral anticoagulation, Xarelto for at least 6 months to a year in the setting of bilateral pulmonary embolus.  Discussed with Dr. Mayme Genta, PA-C  03/21/2020 5:00 PM  This note was created with Dragon medical transcription system.  Any error is purely unintentional

## 2020-03-21 NOTE — ED Provider Notes (Signed)
Procedures     ----------------------------------------- 11:37 AM on 03/21/2020 -----------------------------------------  CTA d/w radiology who notes b/l PE with right heart strain. Will order heparin infusion, covid screening. Pt will need admission for stabilization.     Carrie Mew, MD 03/21/20 1139

## 2020-03-21 NOTE — Consult Note (Signed)
ANTICOAGULATION CONSULT NOTE - Initial Consult  Pharmacy Consult for Heparin Infusion Indication: pulmonary embolus  No Known Allergies  Patient Measurements: Height: 5\' 4"  (162.6 cm) Weight: 90.7 kg (200 lb) IBW/kg (Calculated) : 54.7 Heparin Dosing Weight: 75.1 kg  Vital Signs: Temp: 98.7 F (37.1 C) (04/20 0951) Temp Source: Oral (04/20 0951) BP: 111/81 (04/20 0951) Pulse Rate: 80 (04/20 0951)  Labs: Recent Labs    03/21/20 0948  HGB 14.9  HCT 43.0  PLT 183  APTT 31  LABPROT 16.1*  INR 1.3*  CREATININE 0.77  TROPONINIHS 6    Estimated Creatinine Clearance: 75.5 mL/min (by C-G formula based on SCr of 0.77 mg/dL).   Medical History: Past Medical History:  Diagnosis Date  . Arthritis of left ankle   . Arthritis of left knee   . Hypertension   . Plantar fascial fibromatosis   . PVC (premature ventricular contraction)   . Unspecified osteoarthritis, unspecified site   . Vitamin D deficiency     Medications:  (Not in a hospital admission)  Scheduled:   Infusions:  . heparin 1,250 Units/hr (03/21/20 1329)   PRN:  Anti-infectives (From admission, onward)   None      Assessment: Pharmacy has been consulted to initiate Heparin Infusion in 64yo patient who came in yesterday(03/20/20) with nonocclusive deep venous thrombosis is noted in the left femoral, popliteal, posterior tibial and peroneal veins. Patient was discharged on Xarelto, but has now returned. CT angiogram of chest done which showed bilateral pulmonary emboli with evidence of right heart strain. Numerous bilateral pulmonary nodules, measuring up to 7 mm.  Baseline labs: APTT 31 sec, INR 1.3, HL 2.86 Hgb 14.9, PLTs 183k.    Goal of Therapy:  Heparin level 0.3-0.7 units/ml Monitor platelets by anticoagulation protocol: Yes   Plan:  Give 4500 units bolus x 1 Start heparin infusion at 1250 units/hr Since HL and aPTT do not correlate, will check aPTT level in 6 hours and daily while on heparin,  as well HL daily Continue to monitor H&H and platelets  Hawkins Seaman A Diahn Waidelich 03/21/2020,1:46 PM

## 2020-03-21 NOTE — ED Triage Notes (Signed)
Pt reports was seen at her MD and had an [place] ordered of her left leg. Pt reports her MD called her and told her that she had a DVT and needed to come to the ED. Pt reports that her MD was concerned that she didn't have an x-ray of her chest as well. Pt reports she was given a blood thinner and discharged but last pm she began to feel SOB and has continued to feel that way.

## 2020-03-21 NOTE — Consult Note (Signed)
ANTICOAGULATION CONSULT NOTE - Initial Consult  Pharmacy Consult for Heparin Infusion Indication: pulmonary embolus  No Known Allergies  Patient Measurements: Height: 5\' 4"  (162.6 cm) Weight: 90.7 kg (200 lb) IBW/kg (Calculated) : 54.7 Heparin Dosing Weight: 75.1 kg  Vital Signs: BP: 95/74 (04/20 1510) Pulse Rate: 77 (04/20 1845)  Labs: Recent Labs    03/21/20 0948 03/21/20 1322 03/21/20 1406 03/21/20 2102  HGB 14.9  --   --   --   HCT 43.0  --   --   --   PLT 183  --   --   --   APTT 31  --   --  101*  LABPROT 16.1*  --   --   --   INR 1.3*  --   --   --   HEPARINUNFRC  --   --  2.86*  --   CREATININE 0.77  --   --   --   TROPONINIHS 6 6  --   --     Estimated Creatinine Clearance: 75.5 mL/min (by C-G formula based on SCr of 0.77 mg/dL).   Medical History: Past Medical History:  Diagnosis Date  . Arthritis of left ankle   . Arthritis of left knee   . Hypertension   . Plantar fascial fibromatosis   . Pulmonary embolus ([place]) 03/21/2020  . PVC (premature ventricular contraction)   . Unspecified osteoarthritis, unspecified site   . Vitamin D deficiency     Medications:  (Not in a hospital admission)  Scheduled:   Infusions:  . [START ON 03/22/2020] sodium chloride    . heparin 1,250 Units/hr (03/21/20 1329)   PRN:  Anti-infectives (From admission, onward)   None      Assessment: Pharmacy has been consulted to initiate Heparin Infusion in 64yo patient who came in yesterday(03/20/20) with nonocclusive deep venous thrombosis is noted in the left femoral, popliteal, posterior tibial and peroneal veins. Patient was discharged on Xarelto, but has now returned. CT angiogram of chest done which showed bilateral pulmonary emboli with evidence of right heart strain. Numerous bilateral pulmonary nodules, measuring up to 7 mm.  Baseline labs: APTT 31 sec, INR 1.3, HL 2.86 Hgb 14.9, PLTs 183k.    Goal of Therapy:  Heparin level 0.3-0.7 units/ml Monitor platelets  by anticoagulation protocol: Yes   Plan:  Give 4500 units bolus x 1 Start heparin infusion at 1250 units/hr Since HL and aPTT do not correlate, will check aPTT level in 6 hours and daily while on heparin, as well HL daily Continue to monitor H&H and platelets  4/20: APTT @ 2102 = 101 Will continue pt on current rate and recheck aPTT and HL on 4/21 @ 0500.  Khristian Phillippi D 03/21/2020,9:52 PM

## 2020-03-21 NOTE — Telephone Encounter (Signed)
Spoke with the patient. Patient sts that she was seen in the ED yesterday and dx with a left leg DVT. Advised the patient that her DVT will need to be managed by her pcp since it is a non-cardiac dx. Patient declined and did not want to contact her pcp, stating she trust Dr. Fletcher Anon.  Patient complains of increased SOB and weakness x2 days. She denies palpitations, racing heart, or chest pain. She has started Xarelto as prescribed.  There was no imaging of the chest performed to r/o PE. Advised the patient that based on her recent diagnosis of DVT and progressing symptoms of sob she needs to be seen in the ED asap for evaluation to r/o PE. Patient agrees and will have her husband drive her to the ED now.  Advised the patient that I will fwd a FYI update to Dr. Fletcher Anon.

## 2020-03-22 ENCOUNTER — Encounter: Payer: Self-pay | Admitting: Internal Medicine

## 2020-03-22 ENCOUNTER — Other Ambulatory Visit: Payer: Self-pay

## 2020-03-22 ENCOUNTER — Encounter
Admission: EM | Disposition: A | Discharge status: Home / Self Care | Payer: Self-pay | Source: Home / Self Care | Attending: Internal Medicine

## 2020-03-22 DIAGNOSIS — I2699 Other pulmonary embolism without acute cor pulmonale: Secondary | ICD-10-CM

## 2020-03-22 DIAGNOSIS — I82402 Acute embolism and thrombosis of unspecified deep veins of left lower extremity: Secondary | ICD-10-CM

## 2020-03-22 HISTORY — PX: PULMONARY THROMBECTOMY: CATH118295

## 2020-03-22 LAB — CBC
HCT: 41.1 % (ref 36.0–46.0)
Hemoglobin: 14.3 g/dL (ref 12.0–15.0)
MCH: 29.5 pg (ref 26.0–34.0)
MCHC: 34.8 g/dL (ref 30.0–36.0)
MCV: 84.9 fL (ref 80.0–100.0)
Platelets: 178 10*3/uL (ref 150–400)
RBC: 4.84 MIL/uL (ref 3.87–5.11)
RDW: 13.6 % (ref 11.5–15.5)
WBC: 7 10*3/uL (ref 4.0–10.5)
nRBC: 0 % (ref 0.0–0.2)

## 2020-03-22 LAB — BASIC METABOLIC PANEL
Anion gap: 10 (ref 5–15)
BUN: 18 mg/dL (ref 8–23)
CO2: 25 mmol/L (ref 22–32)
Calcium: 9.1 mg/dL (ref 8.9–10.3)
Chloride: 104 mmol/L (ref 98–111)
Creatinine, Ser: 0.73 mg/dL (ref 0.44–1.00)
GFR calc Af Amer: >60 mL/min (ref >60)
GFR calc non Af Amer: >60 mL/min (ref >60)
Glucose, Bld: 118 mg/dL — ABNORMAL HIGH (ref 70–99)
Potassium: 3.5 mmol/L (ref 3.5–5.1)
Sodium: 139 mmol/L (ref 135–145)

## 2020-03-22 LAB — ECHOCARDIOGRAM COMPLETE
Height: 64 in
Weight: 3200 oz

## 2020-03-22 LAB — APTT
aPTT: 139 seconds — ABNORMAL HIGH (ref 24–36)
aPTT: 71 seconds — ABNORMAL HIGH (ref 24–36)
aPTT: 43 seconds — ABNORMAL HIGH (ref 24–36)

## 2020-03-22 LAB — HEPARIN LEVEL (UNFRACTIONATED): Heparin Unfractionated: 1.42 IU/mL — ABNORMAL HIGH (ref 0.30–0.70)

## 2020-03-22 LAB — HIV ANTIBODY (ROUTINE TESTING W REFLEX): HIV Screen 4th Generation wRfx: NONREACTIVE

## 2020-03-22 SURGERY — PULMONARY THROMBECTOMY
Anesthesia: Moderate Sedation | Laterality: Bilateral

## 2020-03-22 MED ORDER — ONDANSETRON HCL 4 MG/2ML IJ SOLN: 4.0000 mg | Freq: Four times a day (QID) | INTRAMUSCULAR | Status: DC | PRN

## 2020-03-22 MED ORDER — FENTANYL CITRATE (PF) 100 MCG/2ML IJ SOLN
INTRAMUSCULAR | Status: DC | PRN
  Administered 2020-03-22: 25 ug via INTRAVENOUS
  Administered 2020-03-22: 50 ug via INTRAVENOUS
  Administered 2020-03-22 (×2): 25 ug via INTRAVENOUS

## 2020-03-22 MED ORDER — MIDAZOLAM HCL 2 MG/ML PO SYRP: 8.0000 mg | ORAL_SOLUTION | Freq: Once | ORAL | Status: DC | PRN

## 2020-03-22 MED ORDER — HYDROMORPHONE HCL 1 MG/ML IJ SOLN: 1.0000 mg | Freq: Once | INTRAMUSCULAR | Status: DC | PRN

## 2020-03-22 MED ORDER — HEPARIN (PORCINE) 25000 UT/250ML-% IV SOLN: 1200.0000 [IU]/h | INTRAVENOUS | Status: DC

## 2020-03-22 MED ORDER — ALTEPLASE 2 MG IJ SOLR
INTRAMUSCULAR | Status: DC | PRN
  Administered 2020-03-22: 8 mg

## 2020-03-22 MED ORDER — MIDAZOLAM HCL 5 MG/5ML IJ SOLN
INTRAMUSCULAR | Status: AC
  Filled 2020-03-22: qty 5

## 2020-03-22 MED ORDER — FAMOTIDINE 20 MG PO TABS: 40.0000 mg | ORAL_TABLET | Freq: Once | ORAL | Status: DC | PRN

## 2020-03-22 MED ORDER — CEFAZOLIN SODIUM-DEXTROSE 2-4 GM/100ML-% IV SOLN
2.0000 g | Freq: Once | INTRAVENOUS | Status: AC
  Administered 2020-03-22: 2 g via INTRAVENOUS
  Filled 2020-03-22: qty 100

## 2020-03-22 MED ORDER — HEPARIN SODIUM (PORCINE) 1000 UNIT/ML IJ SOLN
INTRAMUSCULAR | Status: AC
  Filled 2020-03-22: qty 1

## 2020-03-22 MED ORDER — MIDAZOLAM HCL 2 MG/2ML IJ SOLN
INTRAMUSCULAR | Status: DC | PRN
  Administered 2020-03-22: 1 mg via INTRAVENOUS
  Administered 2020-03-22: 2 mg via INTRAVENOUS
  Administered 2020-03-22: 1 mg via INTRAVENOUS

## 2020-03-22 MED ORDER — METHYLPREDNISOLONE SODIUM SUCC 125 MG IJ SOLR: 125.0000 mg | Freq: Once | INTRAMUSCULAR | Status: DC | PRN

## 2020-03-22 MED ORDER — DIPHENHYDRAMINE HCL 50 MG/ML IJ SOLN: 50.0000 mg | Freq: Once | INTRAMUSCULAR | Status: DC | PRN

## 2020-03-22 MED ORDER — FENTANYL CITRATE (PF) 100 MCG/2ML IJ SOLN
INTRAMUSCULAR | Status: AC
  Filled 2020-03-22: qty 2

## 2020-03-22 MED ORDER — SODIUM CHLORIDE 0.9 % IV SOLN: INTRAVENOUS | Status: DC

## 2020-03-22 MED ORDER — ALTEPLASE 2 MG IJ SOLR
INTRAMUSCULAR | Status: AC
  Filled 2020-03-22: qty 8

## 2020-03-22 SURGICAL SUPPLY — 12 items
CANISTER PENUMBRA ENGINE (MISCELLANEOUS) ×1 IMPLANT
CATH INDIGO SEP 8 (CATHETERS) ×1 IMPLANT
CATH INFINITI JR4 5F (CATHETERS) ×1 IMPLANT
CATH LIGHTNING 8 XTORQ 115 (CATHETERS) ×1 IMPLANT
GLIDEWIRE ADV .035X180CM (WIRE) ×1 IMPLANT
PACK ANGIOGRAPHY (CUSTOM PROCEDURE TRAY) ×2 IMPLANT
SHEATH PINNACLE 11FRX10 (SHEATH) ×1 IMPLANT
SYR MEDRAD MARK V 150ML (SYRINGE) ×2 IMPLANT
TUBE CONN 8.8X1320 FR HP M-F (CONNECTOR) ×1 IMPLANT
TUBING HIGH PRESSURE 120CM (CONNECTOR) ×1 IMPLANT
WIRE J 3MM .035X145CM (WIRE) ×3 IMPLANT
WIRE MAGIC TORQUE 260C (WIRE) ×1 IMPLANT

## 2020-03-22 NOTE — H&P (Signed)
[facility] History & Physical Update  The patient was interviewed and re-examined.  The patient's previous History and Physical has been reviewed and is unchanged.  There is no change in the plan of care. We plan to proceed with the scheduled procedure.  [name], MD  03/22/2020, 2:35 PM

## 2020-03-22 NOTE — Consult Note (Signed)
ANTICOAGULATION CONSULT NOTE - Initial Consult  Pharmacy Consult for Heparin Infusion Indication: pulmonary embolus  No Known Allergies  Patient Measurements: Height: 5\' 4"  (162.6 cm) Weight: 90.7 kg (200 lb) IBW/kg (Calculated) : 54.7 Heparin Dosing Weight: 75.1 kg  Vital Signs:  BP: 105/65 (04/21 0449) Pulse Rate: 66 (04/21 0449)  Labs: Recent Labs    03/21/20 0948 03/21/20 1322 03/21/20 1406 03/21/20 2102 03/22/20 0450  HGB 14.9  --   --   --  14.3  HCT 43.0  --   --   --  41.1  PLT 183  --   --   --  178  APTT 31  --   --  101* 139*  LABPROT 16.1*  --   --   --   --   INR 1.3*  --   --   --   --   HEPARINUNFRC  --   --  2.86*  --  1.42*  CREATININE 0.77  --   --   --  0.73  TROPONINIHS 6 6  --   --   --     Estimated Creatinine Clearance: 75.5 mL/min (by C-G formula based on SCr of 0.73 mg/dL).   Medical History: Past Medical History:  Diagnosis Date  . Arthritis of left ankle   . Arthritis of left knee   . Hypertension   . Plantar fascial fibromatosis   . Pulmonary embolus ([place]) 03/21/2020  . PVC (premature ventricular contraction)   . Unspecified osteoarthritis, unspecified site   . Vitamin D deficiency     Medications:  (Not in a hospital admission)  Scheduled:   Infusions:  . sodium chloride 75 mL/hr at 03/22/20 0453  . heparin 1,250 Units/hr (03/21/20 1329)   PRN:  Anti-infectives (From admission, onward)   None      Assessment: Pharmacy has been consulted to initiate Heparin Infusion in 64yo patient who came in yesterday(03/20/20) with nonocclusive deep venous thrombosis is noted in the left femoral, popliteal, posterior tibial and peroneal veins. Patient was discharged on Xarelto, but has now returned. CT angiogram of chest done which showed bilateral pulmonary emboli with evidence of right heart strain. Numerous bilateral pulmonary nodules, measuring up to 7 mm.  Baseline labs: APTT 31 sec, INR 1.3, HL 2.86 Hgb 14.9, PLTs 183k.    Goal  of Therapy:  APTT 66 - 102 seconds Heparin level 0.3-0.7 units/ml Monitor platelets by anticoagulation protocol: Yes   Plan:  04/21 @ 0500 aPTT 139 seconds supratherapeutic. Per RN no bleeding issues with patient, will drop the rate down to 900 units/hr and will recheck aPTT at 1200, CBC is trending stable will continue to monitor.  Tobie Lords, PharmD, BCPS Clinical Pharmacist 03/22/2020,5:47 AM

## 2020-03-22 NOTE — Consult Note (Signed)
ANTICOAGULATION CONSULT NOTE - Initial Consult  Pharmacy Consult for Heparin Infusion Indication: pulmonary embolus  No Known Allergies  Patient Measurements: Height: 5\' 4"  (162.6 cm) Weight: 90.7 kg (199 lb 15.3 oz) IBW/kg (Calculated) : 54.7 Heparin Dosing Weight: 75.1 kg  Vital Signs:  Temp: 98 F (36.7 C) (04/21 1949) Temp Source: Oral (04/21 1949) BP: 112/65 (04/21 1949) Pulse Rate: 84 (04/21 1949)  Labs: Recent Labs    03/21/20 0948 03/21/20 1322 03/21/20 1406 03/21/20 2102 03/22/20 0450 03/22/20 1259 03/22/20 2055  HGB 14.9  --   --   --  14.3  --   --   HCT 43.0  --   --   --  41.1  --   --   PLT 183  --   --   --  178  --   --   APTT 31  --   --    < > 139* 71* 43*  LABPROT 16.1*  --   --   --   --   --   --   INR 1.3*  --   --   --   --   --   --   HEPARINUNFRC  --   --  2.86*  --  1.42*  --   --   CREATININE 0.77  --   --   --  0.73  --   --   TROPONINIHS 6 6  --   --   --   --   --    < > = values in this interval not displayed.    Estimated Creatinine Clearance: 75.5 mL/min (by C-G formula based on SCr of 0.73 mg/dL).   Medical History: Past Medical History:  Diagnosis Date  . Arthritis of left ankle   . Arthritis of left knee   . Hypertension   . Plantar fascial fibromatosis   . Pulmonary embolus ([place]) 03/21/2020  . PVC (premature ventricular contraction)   . Unspecified osteoarthritis, unspecified site   . Vitamin D deficiency     Medications:  Medications Prior to Admission  Medication Sig Dispense Refill Last Dose  . meloxicam (MOBIC) 15 MG tablet Take 1 tablet (15 mg total) by mouth daily. 30 tablet 0 03/21/2020 at Unknown time  . olmesartan-hydrochlorothiazide (BENICAR HCT) 20-12.5 MG tablet Take 1 tablet by mouth daily.   03/21/2020 at Unknown time  . RIVAROXABAN (XARELTO) VTE STARTER PACK (15 & 20 MG TABLETS) Follow package directions: Take one 15mg  tablet by mouth twice a day. On day 22, switch to one 20mg  tablet once a day. Take with  food. 51 each 0 03/20/2020 at Unknown time   Scheduled:  . fentaNYL      . fentaNYL      . midazolam       Infusions:  . sodium chloride Stopped (03/22/20 1256)  . heparin 900 Units/hr (03/22/20 1635)   PRN:  Anti-infectives (From admission, onward)   Start     Dose/Rate Route Frequency Ordered Stop   03/22/20 1345  ceFAZolin (ANCEF) IVPB 2g/100 mL premix    Note to Pharmacy: To be given in specials   2 g 200 mL/hr over 30 Minutes Intravenous  Once 03/22/20 1330 03/22/20 1535      Assessment: Pharmacy has been consulted to initiate Heparin Infusion in 64yo patient who came in yesterday(03/20/20) with nonocclusive deep venous thrombosis is noted in the left femoral, popliteal, posterior tibial and peroneal veins. Patient was discharged on Xarelto, but has now returned.  CT angiogram of chest done which showed bilateral pulmonary emboli with evidence of right heart strain. Numerous bilateral pulmonary nodules, measuring up to 7 mm.  Baseline labs: APTT 31 sec, INR 1.3, HL 2.86 Hgb 14.9, PLTs 183k.   Heparin drip started 4/20 with 4500 unit bolus, followed by 1250 units/hr  0420 2102 APTT 101sec therapeutic x 1 0421 0450 APTT 139 sec supratherapeutic - dose reduced to 900 units/hr 0421 1259 APTT  71 sec therapeutic x 1  Goal of Therapy:  APTT 66 - 102 seconds Heparin level 0.3-0.7 units/ml Monitor platelets by anticoagulation protocol: Yes   Plan:  04/21 @ 0500 aPTT 71 seconds = therapeuticx1   Will continue 900 units/hr and will recheck aPTT at 1900  CBC is trending stable will continue to monitor.  4/21: aPTT @ 2055 = 43  Will increase drip rate to 1000 units/hr and recheck aPTT 6 hrs after rate change.   Orene Desanctis, PharmD Clinical Pharmacist 03/22/2020 10:06 PM

## 2020-03-22 NOTE — Progress Notes (Signed)
Dr. Lucky Cowboy at bedside speaking to pt. & her daughter re: procedural results. Both verbalize understanding of conversation.

## 2020-03-22 NOTE — Consult Note (Signed)
ANTICOAGULATION CONSULT NOTE - Initial Consult  Pharmacy Consult for Heparin Infusion Indication: pulmonary embolus  No Known Allergies  Patient Measurements: Height: 5\' 4"  (162.6 cm) Weight: 90.7 kg (200 lb) IBW/kg (Calculated) : 54.7 Heparin Dosing Weight: 75.1 kg  Vital Signs:  Temp: 98.1 F (36.7 C) (04/21 0840) Temp Source: Axillary (04/21 0840) BP: 105/65 (04/21 0449) Pulse Rate: 69 (04/21 1151)  Labs: Recent Labs    03/21/20 0948 03/21/20 0948 03/21/20 1322 03/21/20 1406 03/21/20 2102 03/22/20 0450 03/22/20 1259  HGB 14.9  --   --   --   --  14.3  --   HCT 43.0  --   --   --   --  41.1  --   PLT 183  --   --   --   --  178  --   APTT 31   < >  --   --  101* 139* 71*  LABPROT 16.1*  --   --   --   --   --   --   INR 1.3*  --   --   --   --   --   --   HEPARINUNFRC  --   --   --  2.86*  --  1.42*  --   CREATININE 0.77  --   --   --   --  0.73  --   TROPONINIHS 6  --  6  --   --   --   --    < > = values in this interval not displayed.    Estimated Creatinine Clearance: 75.5 mL/min (by C-G formula based on SCr of 0.73 mg/dL).   Medical History: Past Medical History:  Diagnosis Date  . Arthritis of left ankle   . Arthritis of left knee   . Hypertension   . Plantar fascial fibromatosis   . Pulmonary embolus ([place]) 03/21/2020  . PVC (premature ventricular contraction)   . Unspecified osteoarthritis, unspecified site   . Vitamin D deficiency     Medications:  (Not in a hospital admission)  Scheduled:   Infusions:  . sodium chloride Stopped (03/22/20 1256)  . heparin 900 Units/hr (03/22/20 0552)   PRN:  Anti-infectives (From admission, onward)   None      Assessment: Pharmacy has been consulted to initiate Heparin Infusion in 64yo patient who came in yesterday(03/20/20) with nonocclusive deep venous thrombosis is noted in the left femoral, popliteal, posterior tibial and peroneal veins. Patient was discharged on Xarelto, but has now returned. CT  angiogram of chest done which showed bilateral pulmonary emboli with evidence of right heart strain. Numerous bilateral pulmonary nodules, measuring up to 7 mm.  Baseline labs: APTT 31 sec, INR 1.3, HL 2.86 Hgb 14.9, PLTs 183k.   Heparin drip started 4/20 with 4500 unit bolus, followed by 1250 units/hr  0420 2102 APTT 101sec therapeutic x 1 0421 0450 APTT 139 sec supratherapeutic - dose reduced to 900 units/hr 0421 1259 APTT  71 sec therapeutic x 1  Goal of Therapy:  APTT 66 - 102 seconds Heparin level 0.3-0.7 units/ml Monitor platelets by anticoagulation protocol: Yes   Plan:  04/21 @ 0500 aPTT 71 seconds = therapeuticx1   Will continue 900 units/hr and will recheck aPTT at 1900  CBC is trending stable will continue to monitor.  Lu Duffel, PharmD, BCPS Clinical Pharmacist 03/22/2020 1:32 PM

## 2020-03-22 NOTE — Op Note (Signed)
[facility]  Percutaneous Study/Intervention Procedural Note   Date of Surgery: 03/22/2020,3:46 PM  Surgeon: [name]  Pre-operative Diagnosis: Symptomatic bilateral pulmonary emboli  Post-operative diagnosis:  Same  Procedure(s) Performed:  1.  Contrast injection right heart  2.  Thrombolysis bilateral pulmonary arteries  3.  Mechanical thrombectomy bilaterally pulmonary arteries  4.  Selective catheter placement right middle lobe and lower lobe pulmonary arteries  5.  Selective catheter placement left upper and lower lobe pulmonary arteries    Anesthesia: Conscious sedation was administered under my direct supervision by the interventional radiology RN. IV Versed plus fentanyl were utilized. Continuous ECG, pulse oximetry and blood pressure was monitored throughout the entire procedure.  Versed and fentanyl were administered intravenously.  Conscious sedation was administered for a total of 30 minutes using 4 of Versed and 125 mcg of Fentanyl.  EBL: 500 cc  Sheath: 11 French right femoral vein  Contrast: 50 cc   Fluoroscopy Time: 11.9 minutes  Indications:  Patient presents with pulmonary emboli. The patient is symptomatic with hypoxemia and dyspnea on exertion.  There is evidence of right heart strain on the CT angiogram. The patient is otherwise a good candidate for intervention and even the long-term benefits pulmonary angiography with thrombolysis is offered. The risks and benefits are reviewed long-term benefits are discussed. All questions are answered patient agrees to proceed.  Procedure:  Cindy Stein a 64 y.o. female who was identified and appropriate procedural time out was performed.  The patient was then placed supine on the table and prepped and draped in the usual sterile fashion.  Ultrasound was used to evaluate the right common femoral vein.  It was patent, as it was echolucent and compressible.  A digital ultrasound image was acquired  for the permanent record.  A Seldinger needle was used to access the right common femoral vein under direct ultrasound guidance.  A 0.035 J wire was advanced without resistance and a 5Fr sheath was placed and then upsized to an 8 [place] sheath.    The wire and pigtail catheter were then negotiated into the right atrium and bolus injection of contrast was utilized to demonstrate the right ventricle and the pulmonary artery outflow. The wire and catheter were then negotiated into the main pulmonary artery where hand injection of contrast was utilized to demonstrate the pulmonary arteries and confirm the locations of the pulmonary emboli.  TPA was reconstituted and delivered onto the table. A total of 8 milligrams of TPA was utilized.  4 mg was administered on the left side and 4 mg was administered on the right side. This was then allowed to dwell.  The Penumbra Cat 8 catheter was then advanced up into the pulmonary vasculature. The left lung was addressed first. Catheter was negotiated into the left lower lobe pulmonary artery and after selective imaging mechanical thrombectomy was performed. Follow-up imaging demonstrated a good result with only a small amount of residual thrombus and therefore the catheter was renegotiated into the left upper lobe pulmonary artery and selective imaging was performed.  Again mechanical thrombectomy was performed using the penumbra cat 8 device. Passes were made with both the Penumbra catheter itself as well as introducing the separator. Follow-up imaging was then performed.  Minimal residual thrombus was seen in the left upper lobe.  The Penumbra Cat 8 catheter was then negotiated to the opposite side. The right lung was then addressed. Catheter was negotiated into the right lower lobe and selective imaging was performed showing  significant thrombus burden and mechanical thrombectomy was performed.  This was done both with the penumbra cat 8 catheter itself as well as with  the separator.  Follow-up imaging demonstrated a good result and therefore the catheter was renegotiated into the right middle lobe pulmonary artery.  Selective imaging was performed showing extensive thrombus burden in the right middle lobe.  Mechanical thrombectomy was performed. Passes were made with both the Penumbra cat 8 catheter itself as well as introducing the separator. Follow-up imaging was then performed.  There is a small amount of residual thrombus seen in the right middle and lower lobes but marked improvement.  After review these images wires were reintroduced and the catheters removed. Then, the sheath is then pulled and pressures held. A safeguard is placed.    Findings:   Right heart imaging:  Right atrium and right ventricle and the pulmonary outflow tract appears moderately dilated  Right lung: Significant thrombus burden throughout the right middle and lower lobes with no significant thrombus burden seen in the right upper lobe.  Left lung: Significant thrombus burden worse in the left lower lobe but also in the left upper lobe.    Disposition: Patient was taken to the recovery room in stable condition having tolerated the procedure well.  [name] 03/22/2020,3:46 PM

## 2020-03-22 NOTE — Progress Notes (Signed)
[facility] PROGRESS NOTE    Progress Note  Cindy Stein  [id number] DOB: [birth year] DOA: 03/21/2020 PCP: Lorelee Market, MD     Brief Narrative:   Cindy Stein is an 64 y.o. female past medical history significant for hypertension comes into the emergency room for shortness of breath that started 1 week prior to admission concerning sudden.  1 day prior to admission she was seen at urgent care and diagnosed with a lower extremity nonocclusive DVT was sent home on Xarelto as she became short of breath and was not improving came into the ED.  Assessment/Plan:   Acute pulmonary embolism: CT angio of the chest done on 03/21/2020 lateral PEs with possible RV strain. He was started on IV heparin. 2D echo was done on 03/22/2020 showed an EF of 60% with grade 1 diastolic heart failure left ventricular systolic function was normal RV was not dilated.  Pulmonary pressures were unremarkable. Vascular was consulted by admitting physician.  Essential hypertension Blood pressure continues to be borderline low at this point is 95/74 continue to hold antihypertensive medication.  Multiple 7 mm pulmonary nodules: Only follow-up with PCP as an outpatient   DVT prophylaxis: Heparin Family Communication:none Status is: Inpatient  Remains inpatient appropriate because:IV treatments appropriate due to intensity of illness or inability to take PO   Dispo: The patient is from: Home              Anticipated d/c is to: Home              Anticipated d/c date is: 1 day              Patient currently is not medically stable to d/c.  Code Status:     Code Status Orders  (From admission, onward)         Start     Ordered   03/21/20 1540  Full code  Continuous     03/21/20 1541        Code Status History    This patient has a current code status but no historical code status.   Advance Care Planning Activity        IV Access:    Peripheral IV   Procedures and  diagnostic studies:   DG Chest 2 View  Result Date: 03/21/2020 CLINICAL DATA:  Shortness of breath. Left lower extremity deep venous thrombosis. EXAM: CHEST - 2 VIEW COMPARISON:  Chest x-ray dated 11/23/2018 FINDINGS: The heart size and pulmonary vascularity are normal and the lungs are clear. No effusions. No bone abnormality. IMPRESSION: No active cardiopulmonary disease. Electronically Signed   By: Lorriane Shire M.D.   On: 03/21/2020 10:38   CT Angio Chest PE W and/or Wo Contrast  Result Date: 03/21/2020 CLINICAL DATA:  PE suspected. History of DVT. EXAM: CT ANGIOGRAPHY CHEST WITH CONTRAST TECHNIQUE: Multidetector CT imaging of the chest was performed using the standard protocol during bolus administration of intravenous contrast. Multiplanar CT image reconstructions and MIPs were obtained to evaluate the vascular anatomy. CONTRAST:  91mL OMNIPAQUE IOHEXOL 350 MG/ML SOLN COMPARISON:  Chest x-ray of the same date. FINDINGS: Cardiovascular: Left and right pulmonary arterial emboli. The thrombus bridging upper and lower lobe branches on the left. The thrombus in the right pulmonary artery at the bifurcation extending into right lower lobe. The clot burden greatest on the left. RV to LV ratio of 1.4, no pericardial effusion. Aortic caliber is normal. Mediastinum/Nodes: Thoracic inlet structures are normal. No axillary lymphadenopathy. Lungs/Pleura: Small pulmonary nodules  right upper lobe (image 28, series 6) between largest approximately 3-4 mm. Numerous nodules are scattered about the chest bilaterally. All areas of the lung are involved. (Image 24, series 6): 3 mm right upper lobe nodule. At least 7 nodules are seen in the right upper lobe. (Image 52, series 6): 4-5 mm left lower lobe pulmonary nodule. Small pulmonary nodules also seen in the left upper lobe. Juxtapleural nodule at the right lung base measures 7 mm. (Image 49, series 6) Airways are patent. Upper Abdomen: Hepatic steatosis. Post  cholecystectomy. Musculoskeletal: No acute musculoskeletal process. Review of the MIP images confirms the above findings. IMPRESSION: 1. Bilateral pulmonary arterial emboli, left greater than right. 2. RV to LV ratio of 1.4, no pericardial effusion. Large volume of pulmonary emboli in the lungs bilaterally, with dilatation of the right atrium and right ventricle (RV to LV ratio of 1.4) indicative of elevated right-sided heart pressures and right heart strain. These findings have been shown to be associated with a increased morbidity and mortality in the setting pulmonary embolism. 3. Numerous bilateral pulmonary nodules, measuring up to 7 mm. Given history of thromboembolism neoplasm is considered. By [facility] guidelines with no history of neoplasm a 3 to six-month follow-up would be helpful with further follow-up based on the appearance of these nodules. 4. Hepatic steatosis. Post cholecystectomy. Electronically Signed   By: Zetta Bills M.D.   On: 03/21/2020 11:52   US Venous Img Lower Unilateral Left (DVT)  Result Date: 03/20/2020 CLINICAL DATA:  Left lower extremity swelling. EXAM: Left LOWER EXTREMITY VENOUS DOPPLER ULTRASOUND TECHNIQUE: Gray-scale sonography with graded compression, as well as color Doppler and duplex ultrasound were performed to evaluate the lower extremity deep venous systems from the level of the common femoral vein and including the common femoral, femoral, profunda femoral, popliteal and calf veins including the posterior tibial, peroneal and gastrocnemius veins when visible. The superficial great saphenous vein was also interrogated. Spectral Doppler was utilized to evaluate flow at rest and with distal augmentation maneuvers in the common femoral, femoral and popliteal veins. COMPARISON:  None. FINDINGS: Contralateral Common Femoral Vein: Respiratory phasicity is normal and symmetric with the symptomatic side. No evidence of thrombus. Normal compressibility. Common  Femoral Vein: No evidence of thrombus. Normal compressibility, respiratory phasicity and response to augmentation. Saphenofemoral Junction: No evidence of thrombus. Normal compressibility and flow on color Doppler imaging. Profunda Femoral Vein: No evidence of thrombus. Normal compressibility and flow on color Doppler imaging. Femoral Vein: Decreased compressibility and phasicity is noted consistent with nonocclusive thrombus. Popliteal Vein: Nonocclusive thrombus is noted. Calf Veins: Nonocclusive thrombus is noted. Superficial Great Saphenous Vein: No evidence of thrombus. Normal compressibility. Venous Reflux:  None. Other Findings:  None. IMPRESSION: Nonocclusive deep venous thrombosis is noted in the left femoral, popliteal, posterior tibial and peroneal veins. Electronically Signed   By: Marijo Conception M.D.   On: 03/20/2020 11:38   ECHOCARDIOGRAM COMPLETE  Result Date: 03/22/2020    ECHOCARDIOGRAM REPORT   Patient Name:   MEGGIE CUMINGS Date of Exam: 03/21/2020 Medical Rec #:  [id number]        Height:       64.0 in Accession #:    [id number]       Weight:       200.0 lb Date of Birth:  10-19-1956         BSA:          1.956 m Patient Age:    80 years  BP:           95/74 mmHg Patient Gender: F                HR:           74 bpm. Exam Location:  [facility] Procedure: 2D Echo Indications:     PULMONARY EMBOLUS 415.19/ I26.99  History:         Patient has no prior history of Echocardiogram examinations.                  Risk Factors:Hypertension.  Sonographer:     Avanell Shackleton Referring Phys:  [id number] AGBATA Diagnosing Phys: Nelva Bush MD IMPRESSIONS  1. Left ventricular ejection fraction, by estimation, is 60 to 65%. The left ventricle has normal function. Left ventricular endocardial border not optimally defined to evaluate regional wall motion. Left ventricular diastolic parameters are consistent with Grade I diastolic dysfunction (impaired relaxation).  2. Right ventricular systolic  function is low normal. The right ventricular size is normal. There is normal pulmonary artery systolic pressure.  3. The mitral valve is grossly normal. No evidence of mitral valve regurgitation.  4. The aortic valve is tricuspid. Aortic valve regurgitation is not visualized. No aortic stenosis is present.  5. The inferior vena cava is normal in size with greater than 50% respiratory variability, suggesting right atrial pressure of 3 mmHg. FINDINGS  Left Ventricle: Left ventricular ejection fraction, by estimation, is 60 to 65%. The left ventricle has normal function. Left ventricular endocardial border not optimally defined to evaluate regional wall motion. The left ventricular internal cavity size was normal in size. There is borderline left ventricular hypertrophy. Left ventricular diastolic parameters are consistent with Grade I diastolic dysfunction (impaired relaxation). Right Ventricle: The right ventricular size is normal. No increase in right ventricular wall thickness. Right ventricular systolic function is low normal. There is normal pulmonary artery systolic pressure. The tricuspid regurgitant velocity is 2.66 m/s,  and with an assumed right atrial pressure of 3 mmHg, the estimated right ventricular systolic pressure is 123456 mmHg. Left Atrium: Left atrial size was normal in size. Right Atrium: Right atrial size was not well visualized. Pericardium: There is no evidence of pericardial effusion. Presence of pericardial fat pad. Mitral Valve: The mitral valve is grossly normal. No evidence of mitral valve regurgitation. Tricuspid Valve: The tricuspid valve is grossly normal. Tricuspid valve regurgitation is trivial. Aortic Valve: The aortic valve is tricuspid. Aortic valve regurgitation is not visualized. No aortic stenosis is present. Pulmonic Valve: The pulmonic valve was normal in structure. Pulmonic valve regurgitation is not visualized. No evidence of pulmonic stenosis. Aorta: The aortic root is  normal in size and structure. Pulmonary Artery: The pulmonary artery is of normal size. Venous: The inferior vena cava is normal in size with greater than 50% respiratory variability, suggesting right atrial pressure of 3 mmHg. IAS/Shunts: The interatrial septum was not well visualized.  LEFT VENTRICLE PLAX 2D LVIDd:         3.41 cm  Diastology LVIDs:         1.82 cm  LV e' lateral:   8.70 cm/s LV PW:         0.92 cm  LV E/e' lateral: 6.4 LV IVS:        1.01 cm  LV e' medial:    7.18 cm/s LVOT diam:     1.70 cm  LV E/e' medial:  7.7 LVOT Area:     2.27 cm  RIGHT VENTRICLE  IVC RV S prime:     13.90 cm/s  IVC diam: 1.86 cm LEFT ATRIUM             Index LA diam:        3.30 cm 1.69 cm/m LA Vol (A2C):   32.5 ml 16.61 ml/m LA Vol (A4C):   29.8 ml 15.23 ml/m LA Biplane Vol: 31.9 ml 16.31 ml/m   AORTA Ao Root diam: 3.20 cm MITRAL VALVE               TRICUSPID VALVE MV Area (PHT): 3.23 cm    TR Peak grad:   28.3 mmHg MV Decel Time: 235 msec    TR Vmax:        266.00 cm/s MV E velocity: 55.30 cm/s MV A velocity: 86.30 cm/s  SHUNTS MV E/A ratio:  0.64        Systemic Diam: 1.70 cm Nelva Bush MD Electronically signed by Nelva Bush MD Signature Date/Time: 03/22/2020/6:46:18 AM    Final      Medical Consultants:    None.  Anti-Infectives:   None  Subjective:    Cindy Stein she relates her pain is controlled breathing is stabilized.  Objective:    Vitals:   03/22/20 0330 03/22/20 0449 03/22/20 0605 03/22/20 0840  BP:  105/65    Pulse: (!) 59 66 61   Resp:  16    Temp:    98.1 F (36.7 C)  TempSrc:    Axillary  SpO2: 95% 98% 93%   Weight:      Height:       SpO2: 93 %   Intake/Output Summary (Last 24 hours) at 03/22/2020 1129 Last data filed at 03/22/2020 0552 Gross per 24 hour  Intake 241.36 ml  Output --  Net 241.36 ml   Filed Weights   03/21/20 0945  Weight: 90.7 kg    Exam: General exam: In no acute distress. Respiratory system: Good air movement  and clear to auscultation. Cardiovascular system: S1 & S2 heard, RRR. No JVD. Gastrointestinal system: Abdomen is nondistended, soft and nontender.  Central nervous system: Alert and oriented. No focal neurological deficits. Extremities: No lower extremity edema. Skin: No rashes, lesions or ulcers  Data Reviewed:    Labs: Basic Metabolic Panel: Recent Labs  Lab 03/21/20 0948 03/22/20 0450  NA 137 139  K 3.9 3.5  CL 104 104  CO2 25 25  GLUCOSE 120* 118*  BUN 20 18  CREATININE 0.77 0.73  CALCIUM 9.4 9.1   GFR Estimated Creatinine Clearance: 75.5 mL/min (by C-G formula based on SCr of 0.73 mg/dL). Liver Function Tests: Recent Labs  Lab 03/21/20 0948  AST 24  ALT 30  ALKPHOS 78  BILITOT 1.1  PROT 7.1  ALBUMIN 3.9   Recent Labs  Lab 03/21/20 0948  LIPASE 32   No results for input(s): AMMONIA in the last 168 hours. Coagulation profile Recent Labs  Lab 03/21/20 0948  INR 1.3*   COVID-19 Labs  No results for input(s): DDIMER, FERRITIN, LDH, CRP in the last 72 hours.  Lab Results  Component Value Date   SARSCOV2NAA NEGATIVE 03/21/2020   [place] NEGATIVE 06/10/2019    CBC: Recent Labs  Lab 03/21/20 0948 03/22/20 0450  WBC 6.5 7.0  NEUTROABS 3.6  --   HGB 14.9 14.3  HCT 43.0 41.1  MCV 84.8 84.9  PLT 183 178   Cardiac Enzymes: No results for input(s): CKTOTAL, CKMB, CKMBINDEX, TROPONINI in the last 168 hours. BNP (last  3 results) No results for input(s): PROBNP in the last 8760 hours. CBG: No results for input(s): GLUCAP in the last 168 hours. D-Dimer: No results for input(s): DDIMER in the last 72 hours. Hgb A1c: No results for input(s): HGBA1C in the last 72 hours. Lipid Profile: No results for input(s): CHOL, HDL, LDLCALC, TRIG, CHOLHDL, LDLDIRECT in the last 72 hours. Thyroid function studies: No results for input(s): TSH, T4TOTAL, T3FREE, THYROIDAB in the last 72 hours.  Invalid input(s): FREET3 Anemia work up: No results for  input(s): VITAMINB12, FOLATE, FERRITIN, TIBC, IRON, RETICCTPCT in the last 72 hours. Sepsis Labs: Recent Labs  Lab 03/21/20 0948 03/22/20 0450  WBC 6.5 7.0   Microbiology Recent Results (from the past 240 hour(s))  Respiratory Panel by RT PCR (Flu A&B, Covid) - Nasopharyngeal Swab     Status: None   Collection Time: 03/21/20 11:38 AM   Specimen: Nasopharyngeal Swab  Result Value Ref Range Status   SARS Coronavirus 2 by RT PCR NEGATIVE NEGATIVE Final    Comment: (NOTE) SARS-CoV-2 target nucleic acids are NOT DETECTED. The SARS-CoV-2 RNA is generally detectable in upper respiratoy specimens during the acute phase of infection. The lowest concentration of SARS-CoV-2 viral copies this assay can detect is 131 copies/mL. A negative result does not preclude SARS-Cov-2 infection and should not be used as the sole basis for treatment or other patient management decisions. A negative result may occur with  improper specimen collection/handling, submission of specimen other than nasopharyngeal swab, presence of viral mutation(s) within the areas targeted by this assay, and inadequate number of viral copies (<131 copies/mL). A negative result must be combined with clinical observations, patient history, and epidemiological information. The expected result is Negative. Fact Sheet for Patients:  PinkCheek.be Fact Sheet for Healthcare Providers:  GravelBags.it This test is not yet ap proved or cleared by the [place] FDA and  has been authorized for detection and/or diagnosis of SARS-CoV-2 by FDA under an Emergency Use Authorization (EUA). This EUA will remain  in effect (meaning this test can be used) for the duration of the COVID-19 declaration under Section 564(b)(1) of the Act, 21 U.S.C. section 360bbb-3(b)(1), unless the authorization is terminated or revoked sooner.    Influenza A by PCR NEGATIVE NEGATIVE Final   Influenza  B by PCR NEGATIVE NEGATIVE Final    Comment: (NOTE) The Xpert Xpress SARS-CoV-2/FLU/RSV assay is intended as an aid in  the diagnosis of influenza from Nasopharyngeal swab specimens and  should not be used as a sole basis for treatment. Nasal washings and  aspirates are unacceptable for Xpert Xpress SARS-CoV-2/FLU/RSV  testing. Fact Sheet for Patients: PinkCheek.be Fact Sheet for Healthcare Providers: GravelBags.it This test is not yet approved or cleared by the [place] FDA and  has been authorized for detection and/or diagnosis of SARS-CoV-2 by  FDA under an Emergency Use Authorization (EUA). This EUA will remain  in effect (meaning this test can be used) for the duration of the  Covid-19 declaration under Section 564(b)(1) of the Act, 21  U.S.C. section 360bbb-3(b)(1), unless the authorization is  terminated or revoked. Performed at [facility], [address] [place], [place] [address]      Medications:    Continuous Infusions: . sodium chloride 75 mL/hr at 03/22/20 0453  . heparin 900 Units/hr (03/22/20 0552)      LOS: 1 day   Charlynne Cousins  [facility]  03/22/2020, 11:29 AM

## 2020-03-23 ENCOUNTER — Encounter: Payer: Self-pay | Admitting: Cardiology

## 2020-03-23 ENCOUNTER — Telehealth: Payer: Self-pay | Admitting: Family Medicine

## 2020-03-23 DIAGNOSIS — I10 Essential (primary) hypertension: Secondary | ICD-10-CM

## 2020-03-23 LAB — CBC
HCT: 37.1 % (ref 36.0–46.0)
Hemoglobin: 12.3 g/dL (ref 12.0–15.0)
MCH: 28.9 pg (ref 26.0–34.0)
MCHC: 33.2 g/dL (ref 30.0–36.0)
MCV: 87.3 fL (ref 80.0–100.0)
Platelets: 171 10*3/uL (ref 150–400)
RBC: 4.25 MIL/uL (ref 3.87–5.11)
RDW: 13.6 % (ref 11.5–15.5)
WBC: 7.2 10*3/uL (ref 4.0–10.5)
nRBC: 0 % (ref 0.0–0.2)

## 2020-03-23 LAB — APTT: aPTT: 54 seconds — ABNORMAL HIGH (ref 24–36)

## 2020-03-23 MED ORDER — RIVAROXABAN 20 MG PO TABS: 20.0000 mg | ORAL_TABLET | Freq: Every day | ORAL | Status: DC

## 2020-03-23 MED ORDER — RIVAROXABAN 15 MG PO TABS
15.0000 mg | ORAL_TABLET | Freq: Two times a day (BID) | ORAL | Status: DC
  Administered 2020-03-23: 15 mg via ORAL
  Filled 2020-03-23 (×3): qty 1

## 2020-03-23 MED ORDER — ACETAMINOPHEN 325 MG PO TABS: 650.0000 mg | ORAL_TABLET | Freq: Four times a day (QID) | ORAL | Status: DC | PRN

## 2020-03-23 NOTE — Discharge Summary (Signed)
Physician Discharge Summary  Cindy Stein [id number] DOB: 1956-04-10 DOA: 03/21/2020  PCP: Lorelee Market, MD  Admit date: 03/21/2020 Discharge date: 03/23/2020  Admitted From: home Disposition:  Home  Recommendations for Outpatient Follow-up:  1. Follow up with PCP in 1-2 weeks, she needs follow-up on her 7 mm pulmonary nodule as an outpatient repeat imaging. 2. Please obtain BMP/CBC in one week   Home Health:No Equipment/Devices:None  Discharge Condition:stable CODE STATUS:Full Diet recommendation: Heart Healthy   Brief/Interim Summary:  64 y.o. female past medical history significant for hypertension comes into the emergency room for shortness of breath that started 1 week prior to admission concerning sudden.  1 day prior to admission she was seen at urgent care and diagnosed with a lower extremity nonocclusive DVT was sent home on Xarelto as she became short of breath and was not improving came into the ED.  Discharge Diagnoses:  Active Problems:   Hypertension   Pulmonary embolus (HCC)   Left leg DVT (HCC)   Pulmonary embolism (HCC)  Acute pulmonary embolism: CT angio of the chest was done that showed bilateral PE and possible right heart strain he was started on IV heparin and vascular was consulted to perform a thrombectomy , She will go home on Xarelto and follow-up with vascular as an outpatient.  Essential hypertension: No change made to her medication.  Multiple 7 mm pulmonary nodules: Some follow-up with PCP as an outpatient.  Discharge Instructions  Discharge Instructions    Diet - low sodium heart healthy   Complete by: As directed    Increase activity slowly   Complete by: As directed      Allergies as of 03/23/2020   No Known Allergies     Medication List    STOP taking these medications   meloxicam 15 MG tablet Commonly known as: MOBIC     TAKE these medications   acetaminophen 325 MG tablet Commonly known as: TYLENOL Take 2  tablets (650 mg total) by mouth every 6 (six) hours as needed for mild pain (or Fever >/= 101).   olmesartan-hydrochlorothiazide 20-12.5 MG tablet Commonly known as: BENICAR HCT Take 1 tablet by mouth daily.   Rivaroxaban Stater Pack (15 mg and 20 mg) Commonly known as: XARELTO STARTER PACK Follow package directions: Take one 15mg  tablet by mouth twice a day. On day 22, switch to one 20mg  tablet once a day. Take with food.      Follow-up Information    Dew, Erskine Squibb, MD Follow up in 2 week(s).   Specialties: Vascular Surgery, Radiology, Interventional Cardiology Why: Can see Arna Medici or Dew. Will need DVT study with visit.  Contact information: [place] [address] [phone]          No Known Allergies  Consultations:  Vascular surgery   Procedures/Studies: DG Chest 2 View  Result Date: 03/21/2020 CLINICAL DATA:  Shortness of breath. Left lower extremity deep venous thrombosis. EXAM: CHEST - 2 VIEW COMPARISON:  Chest x-ray dated 11/23/2018 FINDINGS: The heart size and pulmonary vascularity are normal and the lungs are clear. No effusions. No bone abnormality. IMPRESSION: No active cardiopulmonary disease. Electronically Signed   By: Lorriane Shire M.D.   On: 03/21/2020 10:38   CT Angio Chest PE W and/or Wo Contrast  Result Date: 03/21/2020 CLINICAL DATA:  PE suspected. History of DVT. EXAM: CT ANGIOGRAPHY CHEST WITH CONTRAST TECHNIQUE: Multidetector CT imaging of the chest was performed using the standard protocol during bolus administration of intravenous contrast. Multiplanar CT  image reconstructions and MIPs were obtained to evaluate the vascular anatomy. CONTRAST:  36mL OMNIPAQUE IOHEXOL 350 MG/ML SOLN COMPARISON:  Chest x-ray of the same date. FINDINGS: Cardiovascular: Left and right pulmonary arterial emboli. The thrombus bridging upper and lower lobe branches on the left. The thrombus in the right pulmonary artery at the bifurcation extending into right  lower lobe. The clot burden greatest on the left. RV to LV ratio of 1.4, no pericardial effusion. Aortic caliber is normal. Mediastinum/Nodes: Thoracic inlet structures are normal. No axillary lymphadenopathy. Lungs/Pleura: Small pulmonary nodules right upper lobe (image 28, series 6) between largest approximately 3-4 mm. Numerous nodules are scattered about the chest bilaterally. All areas of the lung are involved. (Image 24, series 6): 3 mm right upper lobe nodule. At least 7 nodules are seen in the right upper lobe. (Image 52, series 6): 4-5 mm left lower lobe pulmonary nodule. Small pulmonary nodules also seen in the left upper lobe. Juxtapleural nodule at the right lung base measures 7 mm. (Image 49, series 6) Airways are patent. Upper Abdomen: Hepatic steatosis. Post cholecystectomy. Musculoskeletal: No acute musculoskeletal process. Review of the MIP images confirms the above findings. IMPRESSION: 1. Bilateral pulmonary arterial emboli, left greater than right. 2. RV to LV ratio of 1.4, no pericardial effusion. Large volume of pulmonary emboli in the lungs bilaterally, with dilatation of the right atrium and right ventricle (RV to LV ratio of 1.4) indicative of elevated right-sided heart pressures and right heart strain. These findings have been shown to be associated with a increased morbidity and mortality in the setting pulmonary embolism. 3. Numerous bilateral pulmonary nodules, measuring up to 7 mm. Given history of thromboembolism neoplasm is considered. By [place] guidelines with no history of neoplasm a 3 to six-month follow-up would be helpful with further follow-up based on the appearance of these nodules. 4. Hepatic steatosis. Post cholecystectomy. Electronically Signed   By: Zetta Bills M.D.   On: 03/21/2020 11:52   PERIPHERAL VASCULAR CATHETERIZATION  Result Date: 03/22/2020 See op note  US Venous Img Lower Unilateral Left (DVT)  Result Date: 03/20/2020 CLINICAL DATA:  Left  lower extremity swelling. EXAM: Left LOWER EXTREMITY VENOUS DOPPLER ULTRASOUND TECHNIQUE: Gray-scale sonography with graded compression, as well as color Doppler and duplex ultrasound were performed to evaluate the lower extremity deep venous systems from the level of the common femoral vein and including the common femoral, femoral, profunda femoral, popliteal and calf veins including the posterior tibial, peroneal and gastrocnemius veins when visible. The superficial great saphenous vein was also interrogated. Spectral Doppler was utilized to evaluate flow at rest and with distal augmentation maneuvers in the common femoral, femoral and popliteal veins. COMPARISON:  None. FINDINGS: Contralateral Common Femoral Vein: Respiratory phasicity is normal and symmetric with the symptomatic side. No evidence of thrombus. Normal compressibility. Common Femoral Vein: No evidence of thrombus. Normal compressibility, respiratory phasicity and response to augmentation. Saphenofemoral Junction: No evidence of thrombus. Normal compressibility and flow on color Doppler imaging. Profunda Femoral Vein: No evidence of thrombus. Normal compressibility and flow on color Doppler imaging. Femoral Vein: Decreased compressibility and phasicity is noted consistent with nonocclusive thrombus. Popliteal Vein: Nonocclusive thrombus is noted. Calf Veins: Nonocclusive thrombus is noted. Superficial Great Saphenous Vein: No evidence of thrombus. Normal compressibility. Venous Reflux:  None. Other Findings:  None. IMPRESSION: Nonocclusive deep venous thrombosis is noted in the left femoral, popliteal, posterior tibial and peroneal veins. Electronically Signed   By: Marijo Conception M.D.   On:  03/20/2020 11:38   ECHOCARDIOGRAM COMPLETE  Result Date: 03/22/2020    ECHOCARDIOGRAM REPORT   Patient Name:   Cindy Stein Date of Exam: 03/21/2020 Medical Rec #:  [id number]        Height:       64.0 in Accession #:    [id number]       Weight:        200.0 lb Date of Birth:  09-07-1956         BSA:          1.956 m Patient Age:    47 years         BP:           95/74 mmHg Patient Gender: F                HR:           74 bpm. Exam Location:  [facility] Procedure: 2D Echo Indications:     PULMONARY EMBOLUS 415.19/ I26.99  History:         Patient has no prior history of Echocardiogram examinations.                  Risk Factors:Hypertension.  Sonographer:     Avanell Shackleton Referring Phys:  [id number] AGBATA Diagnosing Phys: Nelva Bush MD IMPRESSIONS  1. Left ventricular ejection fraction, by estimation, is 60 to 65%. The left ventricle has normal function. Left ventricular endocardial border not optimally defined to evaluate regional wall motion. Left ventricular diastolic parameters are consistent with Grade I diastolic dysfunction (impaired relaxation).  2. Right ventricular systolic function is low normal. The right ventricular size is normal. There is normal pulmonary artery systolic pressure.  3. The mitral valve is grossly normal. No evidence of mitral valve regurgitation.  4. The aortic valve is tricuspid. Aortic valve regurgitation is not visualized. No aortic stenosis is present.  5. The inferior vena cava is normal in size with greater than 50% respiratory variability, suggesting right atrial pressure of 3 mmHg. FINDINGS  Left Ventricle: Left ventricular ejection fraction, by estimation, is 60 to 65%. The left ventricle has normal function. Left ventricular endocardial border not optimally defined to evaluate regional wall motion. The left ventricular internal cavity size was normal in size. There is borderline left ventricular hypertrophy. Left ventricular diastolic parameters are consistent with Grade I diastolic dysfunction (impaired relaxation). Right Ventricle: The right ventricular size is normal. No increase in right ventricular wall thickness. Right ventricular systolic function is low normal. There is normal pulmonary artery systolic  pressure. The tricuspid regurgitant velocity is 2.66 m/s,  and with an assumed right atrial pressure of 3 mmHg, the estimated right ventricular systolic pressure is 123456 mmHg. Left Atrium: Left atrial size was normal in size. Right Atrium: Right atrial size was not well visualized. Pericardium: There is no evidence of pericardial effusion. Presence of pericardial fat pad. Mitral Valve: The mitral valve is grossly normal. No evidence of mitral valve regurgitation. Tricuspid Valve: The tricuspid valve is grossly normal. Tricuspid valve regurgitation is trivial. Aortic Valve: The aortic valve is tricuspid. Aortic valve regurgitation is not visualized. No aortic stenosis is present. Pulmonic Valve: The pulmonic valve was normal in structure. Pulmonic valve regurgitation is not visualized. No evidence of pulmonic stenosis. Aorta: The aortic root is normal in size and structure. Pulmonary Artery: The pulmonary artery is of normal size. Venous: The inferior vena cava is normal in size with greater than 50% respiratory variability, suggesting right atrial pressure of  3 mmHg. IAS/Shunts: The interatrial septum was not well visualized.  LEFT VENTRICLE PLAX 2D LVIDd:         3.41 cm  Diastology LVIDs:         1.82 cm  LV e' lateral:   8.70 cm/s LV PW:         0.92 cm  LV E/e' lateral: 6.4 LV IVS:        1.01 cm  LV e' medial:    7.18 cm/s LVOT diam:     1.70 cm  LV E/e' medial:  7.7 LVOT Area:     2.27 cm  RIGHT VENTRICLE             IVC RV S prime:     13.90 cm/s  IVC diam: 1.86 cm LEFT ATRIUM             Index LA diam:        3.30 cm 1.69 cm/m LA Vol (A2C):   32.5 ml 16.61 ml/m LA Vol (A4C):   29.8 ml 15.23 ml/m LA Biplane Vol: 31.9 ml 16.31 ml/m   AORTA Ao Root diam: 3.20 cm MITRAL VALVE               TRICUSPID VALVE MV Area (PHT): 3.23 cm    TR Peak grad:   28.3 mmHg MV Decel Time: 235 msec    TR Vmax:        266.00 cm/s MV E velocity: 55.30 cm/s MV A velocity: 86.30 cm/s  SHUNTS MV E/A ratio:  0.64        Systemic  Diam: 1.70 cm Nelva Bush MD Electronically signed by Nelva Bush MD Signature Date/Time: 03/22/2020/6:46:18 AM    Final        Subjective: No complaints  Discharge Exam: Vitals:   03/23/20 0337 03/23/20 0757  BP: 106/72 105/65  Pulse: 71 (!) 59  Resp: 20 16  Temp: 98 F (36.7 C) 98.1 F (36.7 C)  SpO2: 98% 97%   Vitals:   03/22/20 1805 03/22/20 1949 03/23/20 0337 03/23/20 0757  BP: 110/74 112/65 106/72 105/65  Pulse: 79 84 71 (!) 59  Resp: 17 18 20 16   Temp: 98.4 F (36.9 C) 98 F (36.7 C) 98 F (36.7 C) 98.1 F (36.7 C)  TempSrc: Oral Oral Oral   SpO2: 98% 93% 98% 97%  Weight:   90.9 kg   Height:        General: Pt is alert, awake, not in acute distress Cardiovascular: RRR, S1/S2 +, no rubs, no gallops Respiratory: CTA bilaterally, no wheezing, no rhonchi Abdominal: Soft, NT, ND, bowel sounds + Extremities: no edema, no cyanosis    The results of significant diagnostics from this hospitalization (including imaging, microbiology, ancillary and laboratory) are listed below for reference.     Microbiology: Recent Results (from the past 240 hour(s))  Respiratory Panel by RT PCR (Flu A&B, Covid) - Nasopharyngeal Swab     Status: None   Collection Time: 03/21/20 11:38 AM   Specimen: Nasopharyngeal Swab  Result Value Ref Range Status   SARS Coronavirus 2 by RT PCR NEGATIVE NEGATIVE Final    Comment: (NOTE) SARS-CoV-2 target nucleic acids are NOT DETECTED. The SARS-CoV-2 RNA is generally detectable in upper respiratoy specimens during the acute phase of infection. The lowest concentration of SARS-CoV-2 viral copies this assay can detect is 131 copies/mL. A negative result does not preclude SARS-Cov-2 infection and should not be used as the sole basis for treatment or other  patient management decisions. A negative result may occur with  improper specimen collection/handling, submission of specimen other than nasopharyngeal swab, presence of viral  mutation(s) within the areas targeted by this assay, and inadequate number of viral copies (<131 copies/mL). A negative result must be combined with clinical observations, patient history, and epidemiological information. The expected result is Negative. Fact Sheet for Patients:  PinkCheek.be Fact Sheet for Healthcare Providers:  GravelBags.it This test is not yet ap proved or cleared by the [place] FDA and  has been authorized for detection and/or diagnosis of SARS-CoV-2 by FDA under an Emergency Use Authorization (EUA). This EUA will remain  in effect (meaning this test can be used) for the duration of the COVID-19 declaration under Section 564(b)(1) of the Act, 21 U.S.C. section 360bbb-3(b)(1), unless the authorization is terminated or revoked sooner.    Influenza A by PCR NEGATIVE NEGATIVE Final   Influenza B by PCR NEGATIVE NEGATIVE Final    Comment: (NOTE) The Xpert Xpress SARS-CoV-2/FLU/RSV assay is intended as an aid in  the diagnosis of influenza from Nasopharyngeal swab specimens and  should not be used as a sole basis for treatment. Nasal washings and  aspirates are unacceptable for Xpert Xpress SARS-CoV-2/FLU/RSV  testing. Fact Sheet for Patients: PinkCheek.be Fact Sheet for Healthcare Providers: GravelBags.it This test is not yet approved or cleared by the [place] FDA and  has been authorized for detection and/or diagnosis of SARS-CoV-2 by  FDA under an Emergency Use Authorization (EUA). This EUA will remain  in effect (meaning this test can be used) for the duration of the  Covid-19 declaration under Section 564(b)(1) of the Act, 21  U.S.C. section 360bbb-3(b)(1), unless the authorization is  terminated or revoked. Performed at [facility], [address] [place], [place] [address]      Labs: BNP (last 3 results) No results  for input(s): BNP in the last 8760 hours. Basic Metabolic Panel: Recent Labs  Lab 03/21/20 0948 03/22/20 0450  NA 137 139  K 3.9 3.5  CL 104 104  CO2 25 25  GLUCOSE 120* 118*  BUN 20 18  CREATININE 0.77 0.73  CALCIUM 9.4 9.1   Liver Function Tests: Recent Labs  Lab 03/21/20 0948  AST 24  ALT 30  ALKPHOS 78  BILITOT 1.1  PROT 7.1  ALBUMIN 3.9   Recent Labs  Lab 03/21/20 0948  LIPASE 32   No results for input(s): AMMONIA in the last 168 hours. CBC: Recent Labs  Lab 03/21/20 0948 03/22/20 0450 03/23/20 0440  WBC 6.5 7.0 7.2  NEUTROABS 3.6  --   --   HGB 14.9 14.3 12.3  HCT 43.0 41.1 37.1  MCV 84.8 84.9 87.3  PLT 183 178 171   Cardiac Enzymes: No results for input(s): CKTOTAL, CKMB, CKMBINDEX, TROPONINI in the last 168 hours. BNP: Invalid input(s): POCBNP CBG: No results for input(s): GLUCAP in the last 168 hours. D-Dimer No results for input(s): DDIMER in the last 72 hours. Hgb A1c No results for input(s): HGBA1C in the last 72 hours. Lipid Profile No results for input(s): CHOL, HDL, LDLCALC, TRIG, CHOLHDL, LDLDIRECT in the last 72 hours. Thyroid function studies No results for input(s): TSH, T4TOTAL, T3FREE, THYROIDAB in the last 72 hours.  Invalid input(s): FREET3 Anemia work up No results for input(s): VITAMINB12, FOLATE, FERRITIN, TIBC, IRON, RETICCTPCT in the last 72 hours. Urinalysis No results found for: COLORURINE, APPEARANCEUR, LABSPEC, [place], GLUCOSEU, HGBUR, BILIRUBINUR, KETONESUR, PROTEINUR, UROBILINOGEN, NITRITE, LEUKOCYTESUR Sepsis Labs Invalid input(s):  PROCALCITONIN,  WBC,  LACTICIDVEN Microbiology Recent Results (from the past 240 hour(s))  Respiratory Panel by RT PCR (Flu A&B, Covid) - Nasopharyngeal Swab     Status: None   Collection Time: 03/21/20 11:38 AM   Specimen: Nasopharyngeal Swab  Result Value Ref Range Status   SARS Coronavirus 2 by RT PCR NEGATIVE NEGATIVE Final    Comment: (NOTE) SARS-CoV-2 target nucleic acids  are NOT DETECTED. The SARS-CoV-2 RNA is generally detectable in upper respiratoy specimens during the acute phase of infection. The lowest concentration of SARS-CoV-2 viral copies this assay can detect is 131 copies/mL. A negative result does not preclude SARS-Cov-2 infection and should not be used as the sole basis for treatment or other patient management decisions. A negative result may occur with  improper specimen collection/handling, submission of specimen other than nasopharyngeal swab, presence of viral mutation(s) within the areas targeted by this assay, and inadequate number of viral copies (<131 copies/mL). A negative result must be combined with clinical observations, patient history, and epidemiological information. The expected result is Negative. Fact Sheet for Patients:  PinkCheek.be Fact Sheet for Healthcare Providers:  GravelBags.it This test is not yet ap proved or cleared by the [place] FDA and  has been authorized for detection and/or diagnosis of SARS-CoV-2 by FDA under an Emergency Use Authorization (EUA). This EUA will remain  in effect (meaning this test can be used) for the duration of the COVID-19 declaration under Section 564(b)(1) of the Act, 21 U.S.C. section 360bbb-3(b)(1), unless the authorization is terminated or revoked sooner.    Influenza A by PCR NEGATIVE NEGATIVE Final   Influenza B by PCR NEGATIVE NEGATIVE Final    Comment: (NOTE) The Xpert Xpress SARS-CoV-2/FLU/RSV assay is intended as an aid in  the diagnosis of influenza from Nasopharyngeal swab specimens and  should not be used as a sole basis for treatment. Nasal washings and  aspirates are unacceptable for Xpert Xpress SARS-CoV-2/FLU/RSV  testing. Fact Sheet for Patients: PinkCheek.be Fact Sheet for Healthcare Providers: GravelBags.it This test is not yet approved or  cleared by the [place] FDA and  has been authorized for detection and/or diagnosis of SARS-CoV-2 by  FDA under an Emergency Use Authorization (EUA). This EUA will remain  in effect (meaning this test can be used) for the duration of the  Covid-19 declaration under Section 564(b)(1) of the Act, 21  U.S.C. section 360bbb-3(b)(1), unless the authorization is  terminated or revoked. Performed at [facility], [address] [place], [place] [address]      Time coordinating discharge: Over 40 minutes  SIGNED:   Charlynne Cousins, MD  [facility] 03/23/2020, 11:17 AM Pager   If 7PM-7AM, please contact night-coverage [email or web address] Password [email or web address]

## 2020-03-23 NOTE — Consult Note (Signed)
ANTICOAGULATION CONSULT NOTE - Initial Consult  Pharmacy Consult for Heparin Infusion Indication: pulmonary embolus  No Known Allergies  Patient Measurements: Height: 5\' 4"  (162.6 cm) Weight: 90.9 kg (200 lb 4.8 oz) IBW/kg (Calculated) : 54.7 Heparin Dosing Weight: 75.1 kg  Vital Signs:  Temp: 98 F (36.7 C) (04/22 0337) Temp Source: Oral (04/22 0337) BP: 106/72 (04/22 0337) Pulse Rate: 71 (04/22 0337)  Labs: Recent Labs     0000 03/21/20 0948 03/21/20 1322 03/21/20 1406 03/21/20 2102 03/22/20 0450 03/22/20 0450 03/22/20 1259 03/22/20 2055 03/23/20 0440  HGB   < > 14.9  --   --   --  14.3  --   --   --  12.3  HCT  --  43.0  --   --   --  41.1  --   --   --  37.1  PLT  --  183  --   --   --  178  --   --   --  171  APTT  --  31  --   --    < > 139*   < > 71* 43* 54*  LABPROT  --  16.1*  --   --   --   --   --   --   --   --   INR  --  1.3*  --   --   --   --   --   --   --   --   HEPARINUNFRC  --   --   --  2.86*  --  1.42*  --   --   --   --   CREATININE  --  0.77  --   --   --  0.73  --   --   --   --   TROPONINIHS  --  6 6  --   --   --   --   --   --   --    < > = values in this interval not displayed.    Estimated Creatinine Clearance: 75.6 mL/min (by C-G formula based on SCr of 0.73 mg/dL).   Medical History: Past Medical History:  Diagnosis Date  . Arthritis of left ankle   . Arthritis of left knee   . Hypertension   . Plantar fascial fibromatosis   . Pulmonary embolus ([place]) 03/21/2020  . PVC (premature ventricular contraction)   . Unspecified osteoarthritis, unspecified site   . Vitamin D deficiency     Medications:  Medications Prior to Admission  Medication Sig Dispense Refill Last Dose  . meloxicam (MOBIC) 15 MG tablet Take 1 tablet (15 mg total) by mouth daily. 30 tablet 0 03/21/2020 at Unknown time  . olmesartan-hydrochlorothiazide (BENICAR HCT) 20-12.5 MG tablet Take 1 tablet by mouth daily.   03/21/2020 at Unknown time  . RIVAROXABAN  (XARELTO) VTE STARTER PACK (15 & 20 MG TABLETS) Follow package directions: Take one 15mg  tablet by mouth twice a day. On day 22, switch to one 20mg  tablet once a day. Take with food. 51 each 0 03/20/2020 at Unknown time   Scheduled:   Infusions:  . sodium chloride Stopped (03/22/20 1256)  . heparin 1,000 Units/hr (03/22/20 2208)   PRN:  Anti-infectives (From admission, onward)   Start     Dose/Rate Route Frequency Ordered Stop   03/22/20 1345  ceFAZolin (ANCEF) IVPB 2g/100 mL premix    Note to Pharmacy: To be given in specials   2  g 200 mL/hr over 30 Minutes Intravenous  Once 03/22/20 1330 03/22/20 1535      Assessment: Pharmacy has been consulted to initiate Heparin Infusion in 64yo patient who came in yesterday(03/20/20) with nonocclusive deep venous thrombosis is noted in the left femoral, popliteal, posterior tibial and peroneal veins. Patient was discharged on Xarelto, but has now returned. CT angiogram of chest done which showed bilateral pulmonary emboli with evidence of right heart strain. Numerous bilateral pulmonary nodules, measuring up to 7 mm.  Baseline labs: APTT 31 sec, INR 1.3, HL 2.86 Hgb 14.9, PLTs 183k.   Heparin drip started 4/20 with 4500 unit bolus, followed by 1250 units/hr  0420 2102 APTT 101sec therapeutic x 1 0421 0450 APTT 139 sec supratherapeutic - dose reduced to 900 units/hr 0421 1259 APTT  71 sec therapeutic x 1  Goal of Therapy:  APTT 66 - 102 seconds Heparin level 0.3-0.7 units/ml Monitor platelets by anticoagulation protocol: Yes   Plan:  04/22 @ 0440 aPTT 54 seconds subtherapeutic. Will increase rate to 1200 units/hr and recheck aPTT 1200, CBC trending down but stable will continue to monitor.  Tobie Lords, PharmD Clinical Pharmacist 03/23/2020 6:20 AM

## 2020-03-23 NOTE — Telephone Encounter (Signed)
Would Dr. [name] take Cindy Stein on as a new patient? Her husband sees Dr. [name]. Husband's name is [employer].  Cayde Held,cma

## 2020-03-23 NOTE — Telephone Encounter (Signed)
That is fine 

## 2020-03-23 NOTE — Discharge Instructions (Signed)
You may shower. Keep groins clean and dry.  

## 2020-03-23 NOTE — Consult Note (Signed)
ANTICOAGULATION CONSULT NOTE - Initial Consult  Pharmacy Consult for Heparin Infusion transition to Xarelto Indication: pulmonary embolus  No Known Allergies  Patient Measurements: Height: 5\' 4"  (162.6 cm) Weight: 90.9 kg (200 lb 4.8 oz) IBW/kg (Calculated) : 54.7 Heparin Dosing Weight: 75.1 kg  Vital Signs:  Temp: 98.3 F (36.8 C) (04/22 1201) Temp Source: Oral (04/22 0337) BP: 110/70 (04/22 1201) Pulse Rate: 66 (04/22 1201)  Labs: Recent Labs     0000 03/21/20 0948 03/21/20 1322 03/21/20 1406 03/21/20 2102 03/22/20 0450 03/22/20 0450 03/22/20 1259 03/22/20 2055 03/23/20 0440  HGB   < > 14.9  --   --   --  14.3  --   --   --  12.3  HCT  --  43.0  --   --   --  41.1  --   --   --  37.1  PLT  --  183  --   --   --  178  --   --   --  171  APTT  --  31  --   --    < > 139*   < > 71* 43* 54*  LABPROT  --  16.1*  --   --   --   --   --   --   --   --   INR  --  1.3*  --   --   --   --   --   --   --   --   HEPARINUNFRC  --   --   --  2.86*  --  1.42*  --   --   --   --   CREATININE  --  0.77  --   --   --  0.73  --   --   --   --   TROPONINIHS  --  6 6  --   --   --   --   --   --   --    < > = values in this interval not displayed.    Estimated Creatinine Clearance: 75.6 mL/min (by C-G formula based on SCr of 0.73 mg/dL).   Medical History: Past Medical History:  Diagnosis Date  . Arthritis of left ankle   . Arthritis of left knee   . Hypertension   . Plantar fascial fibromatosis   . Pulmonary embolus ([place]) 03/21/2020  . PVC (premature ventricular contraction)   . Unspecified osteoarthritis, unspecified site   . Vitamin D deficiency     Medications:  Medications Prior to Admission  Medication Sig Dispense Refill Last Dose  . meloxicam (MOBIC) 15 MG tablet Take 1 tablet (15 mg total) by mouth daily. 30 tablet 0 03/21/2020 at Unknown time  . olmesartan-hydrochlorothiazide (BENICAR HCT) 20-12.5 MG tablet Take 1 tablet by mouth daily.   03/21/2020 at Unknown  time  . RIVAROXABAN (XARELTO) VTE STARTER PACK (15 & 20 MG TABLETS) Follow package directions: Take one 15mg  tablet by mouth twice a day. On day 22, switch to one 20mg  tablet once a day. Take with food. 51 each 0 03/20/2020 at Unknown time   Scheduled:  . Rivaroxaban  15 mg Oral BID WC   Followed by  . [START ON 04/13/2020] rivaroxaban  20 mg Oral Q supper   Infusions:   PRN:  Anti-infectives (From admission, onward)   Start     Dose/Rate Route Frequency Ordered Stop   03/22/20 1345  ceFAZolin (ANCEF) IVPB 2g/100 mL premix  Note to Pharmacy: To be given in specials   2 g 200 mL/hr over 30 Minutes Intravenous  Once 03/22/20 1330 03/22/20 1535      Assessment: Pharmacy has been consulted to initiate Xarelto in 64yo patient who came in Monday (03/20/20) with nonocclusive deep venous thrombosis is noted in the left femoral, popliteal, posterior tibial and peroneal veins. Patient was discharged on Xarelto, but has now returned. Pharmacy called and spoke to patient who reports not taking medication at home d/t not feeling well when she got home. CT angiogram of chest done which showed bilateral pulmonary emboli with evidence of right heart strain. Numerous bilateral pulmonary nodules, measuring up to 7 mm.  .   Plan:  1. D/c heparin infusion and start Xarelto 15 mg twice daily with food for 21 days followed by 20 mg once daily with food. Will follow Scr and CBC every 3 days.   Rowland Lathe, PharmD Clinical Pharmacist 03/23/2020 12:08 PM

## 2020-03-23 NOTE — Progress Notes (Signed)
[facility] Daily Progress Note  Subjective: 03/22/20:             1.  Contrast injection right heart             2.  Thrombolysis bilateral pulmonary arteries             3.  Mechanical thrombectomy bilaterally pulmonary arteries             4.  Selective catheter placement right middle lobe and lower lobe pulmonary arteries             5.  Selective catheter placement left upper and lower lobe pulmonary arteries  Patient with improved shortness of breath this AM. No issues overnight.  Heparin discontinued.  P.O. Eliquis restarted.  Objective: Vitals:   03/22/20 1805 03/22/20 1949 03/23/20 0337 03/23/20 0757  BP: 110/74 112/65 106/72 105/65  Pulse: 79 84 71 (!) 59  Resp: 17 18 20 16   Temp: 98.4 F (36.9 C) 98 F (36.7 C) 98 F (36.7 C) 98.1 F (36.7 C)  TempSrc: Oral Oral Oral   SpO2: 98% 93% 98% 97%  Weight:   90.9 kg   Height:        Intake/Output Summary (Last 24 hours) at 03/23/2020 1024 Last data filed at 03/22/2020 2300 Gross per 24 hour  Intake 58.3 ml  Output --  Net 58.3 ml   Physical Exam: A&Ox3, NAD CV: RRR Pulmonary: CTA Bilaterally Abdomen: Soft, Nontender, Nondistended,  Right Groin:  PAD intact, clean and dry Vascular:  Left Lower Extremity: Thigh soft.  Calf soft.  Mild edema.  No signs of acute vascular compromise at this time.   Laboratory: CBC    Component Value Date/Time   WBC 7.2 03/23/2020 0440   HGB 12.3 03/23/2020 0440   HGB 14.6 06/03/2019 1056   HCT 37.1 03/23/2020 0440   HCT 43.0 06/03/2019 1056   PLT 171 03/23/2020 0440   PLT 211 06/03/2019 1056   BMET    Component Value Date/Time   NA 139 03/22/2020 0450   NA 143 06/29/2019 0943   K 3.5 03/22/2020 0450   CL 104 03/22/2020 0450   CO2 25 03/22/2020 0450   GLUCOSE 118 (H) 03/22/2020 0450   BUN 18 03/22/2020 0450   BUN 29 (H) 06/29/2019 0943   CREATININE 0.73 03/22/2020 0450   CALCIUM 9.1 03/22/2020 0450   GFRNONAA >60 03/22/2020 0450   GFRAA >60  03/22/2020 0450   Assessment/Planning: The patient is a 64 year old female with left lower extremity DVT bilateral PE status post pulmonary thrombolysis / thrombectomy - POD#1  PE: 1) patient with improvement of shortness of breath this AM. 2) Heparin stopped.  Xarelto started. 3) remove PAD.  DVT: 1) On Xarelto. 2) We will see the patient in the office for continued follow-up 3) Recommend elevation  From a vascular standpoint, patient can be discharged home  Discussed with Dr. Ellis Parents [facility] PA-C 03/23/2020 10:24 AM

## 2020-03-23 NOTE — Telephone Encounter (Signed)
Would Dr. [name] take [place] on as a new patient? Her husband sees Dr. [name]. Husband's name is Hydrographic surveyor.

## 2020-03-23 NOTE — Plan of Care (Signed)
  Problem: Safety: Goal: Ability to remain free from injury will improve Outcome: Progressing   Problem: Phase I Progression Outcomes Goal: Dyspnea controlled at rest (PE) Outcome: Progressing

## 2020-03-24 NOTE — Telephone Encounter (Signed)
Meredith Dr. [name] is taking on this patient as a new patient, can you call and get her scheduled for a visit if she needs it and get all the information you will need.  Thanks.  Tayjah Lobdell,cma

## 2020-03-28 ENCOUNTER — Other Ambulatory Visit (INDEPENDENT_AMBULATORY_CARE_PROVIDER_SITE_OTHER): Payer: Self-pay | Admitting: Vascular Surgery

## 2020-03-28 ENCOUNTER — Encounter: Payer: Self-pay | Admitting: Family Medicine

## 2020-03-28 ENCOUNTER — Ambulatory Visit (INDEPENDENT_AMBULATORY_CARE_PROVIDER_SITE_OTHER): Payer: Medicare Other | Admitting: Family Medicine

## 2020-03-28 ENCOUNTER — Other Ambulatory Visit: Payer: Self-pay

## 2020-03-28 VITALS — BP 110/70 | HR 77 | Temp 97.7°F | Ht 64.0 in | Wt 198.8 lb

## 2020-03-28 DIAGNOSIS — Z1231 Encounter for screening mammogram for malignant neoplasm of breast: Secondary | ICD-10-CM

## 2020-03-28 DIAGNOSIS — I10 Essential (primary) hypertension: Secondary | ICD-10-CM

## 2020-03-28 DIAGNOSIS — R918 Other nonspecific abnormal finding of lung field: Secondary | ICD-10-CM

## 2020-03-28 DIAGNOSIS — I82412 Acute embolism and thrombosis of left femoral vein: Secondary | ICD-10-CM

## 2020-03-28 DIAGNOSIS — Z1211 Encounter for screening for malignant neoplasm of colon: Secondary | ICD-10-CM

## 2020-03-28 DIAGNOSIS — I2699 Other pulmonary embolism without acute cor pulmonale: Secondary | ICD-10-CM

## 2020-03-28 DIAGNOSIS — I2609 Other pulmonary embolism with acute cor pulmonale: Secondary | ICD-10-CM

## 2020-03-28 LAB — CBC
HCT: 39.9 % (ref 36.0–46.0)
Hemoglobin: 13.4 g/dL (ref 12.0–15.0)
MCHC: 33.6 g/dL (ref 30.0–36.0)
MCV: 87.5 fl (ref 78.0–100.0)
Platelets: 264 10*3/uL (ref 150.0–400.0)
RBC: 4.55 Mil/uL (ref 3.87–5.11)
RDW: 14.5 % (ref 11.5–15.5)
WBC: 7.4 10*3/uL (ref 4.0–10.5)

## 2020-03-28 LAB — BASIC METABOLIC PANEL
BUN: 15 mg/dL (ref 6–23)
CO2: 26 mEq/L (ref 19–32)
Calcium: 9.4 mg/dL (ref 8.4–10.5)
Chloride: 102 mEq/L (ref 96–112)
Creatinine, Ser: 0.77 mg/dL (ref 0.40–1.20)
GFR: 74.97 mL/min (ref >60.00)
Glucose, Bld: 92 mg/dL (ref 70–99)
Potassium: 3.6 mEq/L (ref 3.5–5.1)
Sodium: 135 mEq/L (ref 135–145)

## 2020-03-28 NOTE — Assessment & Plan Note (Addendum)
Appears to have been unprovoked.  Discussed the need for anticoagulation for this. She will continue xarelto.  She is having trouble affording the Xarelto and thus will refer her to our clinical pharmacist to see about patient assistance for this.  She will follow up with vascular surgery as scheduled.  Given reasons to seek medical attention in the emergency room.  Will complete cancer screening with Cologuard and mammogram.  The patient will call to schedule her mammogram.

## 2020-03-28 NOTE — Assessment & Plan Note (Signed)
Well-controlled.  Continue current regimen. 

## 2020-03-28 NOTE — Progress Notes (Signed)
[name], MD Phone: [phone]  Cindy Stein is a 64 y.o. female who presents today for new patient visit.   PE/DVT: Patient recently hospitalized for unprovoked PE and DVT.  She underwent thrombolysis and thrombectomy with vascular surgery.  She was subsequently started on Xarelto.  She presented with chest heaviness and shortness of breath.  She notes the symptoms have improved significantly.  She notes no travel recently or surgeries.  She is not up-to-date on mammogram or colonoscopy.  She reports no prior abnormal Pap smears.  She has seen GYN recently.  Pulmonary nodules: Noted on imaging.  No history of smoking.  76-month follow-up imaging recommended.  Hypertension: Taking Benicar.  She was worked up for unstable angina in 2020 with normal coronaries on catheterization and apparent normal LV function.  Active Ambulatory Problems    Diagnosis Date Noted  . Hypertension   . PVC (premature ventricular contraction)   . Pulmonary embolus ([place]) 03/21/2020  . Left leg DVT ([place]) 03/21/2020  . Pulmonary nodules 03/28/2020   Resolved Ambulatory Problems    Diagnosis Date Noted  . Unstable angina ([place])   . Pulmonary embolism ([place]) 03/21/2020   Past Medical History:  Diagnosis Date  . Arthritis of left ankle   . Arthritis of left knee   . Plantar fascial fibromatosis   . Unspecified osteoarthritis, unspecified site   . Vitamin D deficiency     Family History  Problem Relation Age of Onset  . Asthma Mother   . Asthma Father   . Heart failure Father   . Leukemia Sister     Social History   Socioeconomic History  . Marital status: Married    Spouse name: Jeno  . Number of children: 1  . Years of education: Not on file  . Highest education level: Not on file  Occupational History  . 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 Smoking status: Never Smoker  . Smokeless tobacco: Never Used  Substance and Sexual Activity  . Alcohol use:  Yes    Comment: occass  . Drug use: Never  . Sexual activity: Not Currently  Other Topics Concern  . Not on file  Social History Narrative   Lives at home with spouse   Social Determinants of Health   Financial Resource Strain:   . Difficulty of Paying Living Expenses:   Food Insecurity:   . Worried About Charity fundraiser in the Last Year:   . Arboriculturist in the Last Year:   Transportation Needs:   . Film/video editor (Medical):   Marland Kitchen Lack of Transportation (Non-Medical):   Physical Activity:   . Days of Exercise per Week:   . Minutes of Exercise per Session:   Stress:   . Feeling of Stress :   Social Connections:   . Frequency of Communication with Friends and Family:   . Frequency of Social Gatherings with Friends and Family:   . Attends Religious Services:   . Active Member of Clubs or Organizations:   . Attends Archivist Meetings:   Marland Kitchen Marital Status:   Intimate Partner Violence:   . Fear of Current or Ex-Partner:   . Emotionally Abused:   Marland Kitchen Physically Abused:   . Sexually Abused:     ROS See HPI  Objective  Physical Exam Vitals:   03/28/20 1057  BP: 110/70  Pulse: 77  Temp: 97.7 F (36.5 C)  SpO2: 97%  BP Readings from Last 3 Encounters:  03/28/20 110/70  03/23/20 110/70  03/20/20 123/79   Wt Readings from Last 3 Encounters:  03/28/20 198 lb 12.8 oz (90.2 kg)  03/23/20 200 lb 4.8 oz (90.9 kg)  03/20/20 195 lb (88.5 kg)    Physical Exam Constitutional:      General: She is not in acute distress.    Appearance: She is not diaphoretic.  Cardiovascular:     Rate and Rhythm: Normal rate and regular rhythm.     Heart sounds: Normal heart sounds.  Pulmonary:     Effort: Pulmonary effort is normal.     Breath sounds: Normal breath sounds.  Musculoskeletal:     Right lower leg: No edema.     Left lower leg: No edema.  Skin:    General: Skin is warm and dry.  Neurological:     Mental Status: She is alert.       Assessment/Plan:   Pulmonary embolus ([facility]) Appears to have been unprovoked.  Discussed the need for anticoagulation for this. She will continue xarelto.  She is having trouble affording the Xarelto and thus will refer her to our clinical pharmacist to see about patient assistance for this.  She will follow up with vascular surgery as scheduled.  Given reasons to seek medical attention in the emergency room.  Will complete cancer screening with Cologuard and mammogram.  The patient will call to schedule her mammogram.  Hypertension Well-controlled.  Continue current regimen.    Pulmonary nodules CT scan ordered for 89-month follow-up of nodules.   The patient will call us if she does not hear anything from the clinical pharmacy team in the next week.  Orders Placed This Encounter  Procedures  . MM 3D SCREEN BREAST BILATERAL    Standing Status:   Future    Standing Expiration Date:   05/28/2021    Order Specific Question:   Reason for Exam (SYMPTOM  OR DIAGNOSIS REQUIRED)    Answer:   breast cancer screening    Order Specific Question:   Preferred imaging location?    Answer:   [facility]  . CT CHEST NODULE FOLLOW UP LOW DOSE W/O    Standing Status:   Future    Standing Expiration Date:   05/28/2021    Scheduling Instructions:     Schedule for July 2021    Order Specific Question:   Preferred imaging location?    Answer:   [facility]    Order Specific Question:   Radiology Contrast Protocol - do NOT remove file path    Answer:   [email or web address]  . Cologuard  . CBC  . Basic Metabolic Panel (BMET)  . Ambulatory referral to Chronic Care Management Services    Referral Priority:   Routine    Referral Type:   Consultation    Referral Reason:   Care Coordination    Number of Visits Requested:   1    No orders of the defined types were placed in this encounter.   This visit occurred during the SARS-CoV-2 public health emergency.   Safety protocols were in place, including screening questions prior to the visit, additional usage of staff PPE, and extensive cleaning of exam room while observing appropriate contact time as indicated for disinfecting solutions.    [name], MD [place]

## 2020-03-28 NOTE — Assessment & Plan Note (Signed)
CT scan ordered for 3-month follow-up of nodules.

## 2020-03-28 NOTE — Patient Instructions (Signed)
Nice to see you. Please call to schedule your mammogram. Cologuard will come in the mail to you.  Please complete this. Please keep your appointment with vascular surgery. Our clinical pharmacist will contact you to help with your Xarelto. If you develop chest pain or shortness of breath please seek medical attention in the emergency room.

## 2020-03-30 ENCOUNTER — Ambulatory Visit: Payer: Medicare Other | Admitting: Pharmacist

## 2020-03-30 DIAGNOSIS — R918 Other nonspecific abnormal finding of lung field: Secondary | ICD-10-CM

## 2020-03-30 DIAGNOSIS — I2699 Other pulmonary embolism without acute cor pulmonale: Secondary | ICD-10-CM

## 2020-03-30 DIAGNOSIS — I10 Essential (primary) hypertension: Secondary | ICD-10-CM

## 2020-03-30 DIAGNOSIS — I82402 Acute embolism and thrombosis of unspecified deep veins of left lower extremity: Secondary | ICD-10-CM

## 2020-03-30 DIAGNOSIS — I493 Ventricular premature depolarization: Secondary | ICD-10-CM

## 2020-03-30 NOTE — Chronic Care Management (AMB) (Signed)
Chronic Care Management   Note  03/30/2020 Name: Cindy Stein MRN: [id number] DOB: December 30, 1955   Subjective:  Cindy Stein is a 64 y.o. year old female who is a primary care patient of Cindy Stein, Cindy Adam, MD. The CCM team was consulted for assistance with chronic disease management and care coordination needs.     [name] was given information about Chronic Care Management services today including:  1. CCM service includes personalized support from designated clinical staff supervised by her physician, including individualized plan of care and coordination with other care providers 2. 24/7 contact phone numbers for assistance for urgent and routine care needs. 3. Service will only be billed when office clinical staff spend 20 minutes or more in a month to coordinate care. 4. Only one practitioner may furnish and bill the service in a calendar month. 5. The patient may stop CCM services at any time (effective at the end of the month) by phone call to the office staff. 6. The patient will be responsible for cost sharing (co-pay) of up to 20% of the service fee (after annual deductible is met).  Patient agreed to services and verbal consent obtained.   Review of patient status, including review of consultants reports, laboratory and other test data, was performed as part of comprehensive evaluation and provision of chronic care management services.   SDOH (Social Determinants of Health) assessments and interventions performed: yes  Objective:  Lab Results  Component Value Date   CREATININE 0.77 03/28/2020   CREATININE 0.73 03/22/2020   CREATININE 0.77 03/21/2020    Clinical ASCVD: No  The ASCVD Risk score [name], et al., 2013) failed to calculate for the following reasons:   Cannot find a previous HDL lab   Cannot find a previous total cholesterol lab    BP Readings from Last 3 Encounters:  03/28/20 110/70  03/23/20 110/70  03/20/20 123/79    No Known  Allergies  Medications Reviewed Today    Reviewed by Gordy Councilman, CMA (Certified Medical Assistant) on 03/28/20 at 1101  Med List Status: <None>  Medication Order Taking? Sig Documenting Provider Last Dose Status Informant  acetaminophen (TYLENOL) 325 MG tablet [id number] Yes Take 2 tablets (650 mg total) by mouth every 6 (six) hours as needed for mild pain (or Fever >/= 101). Charlynne Cousins, MD Taking Active   carvedilol (COREG) 3.125 MG tablet [id number] Yes Take 3.125 mg by mouth 2 (two) times daily with a meal. [provider] Taking Active   hydrochlorothiazide (HYDRODIURIL) 12.5 MG tablet [id number] Yes Take 12.5 mg by mouth daily. [provider] Taking Active   olmesartan-hydrochlorothiazide (BENICAR HCT) 20-12.5 MG tablet [id number] Yes Take 1 tablet by mouth daily. [provider] Taking Active Self  RIVAROXABAN (XARELTO) VTE STARTER PACK (15 & 20 MG TABLETS) [id number] Yes Follow package directions: Take one 66m tablet by mouth twice a day. On day 22, switch to one 262mtablet once a day. Take with food. CuDarletta MollPA-C Taking Active Self           Med Note (BOwens SharkKADESHA   Tue Mar 21, 2020 12:05 PM) PT STATED DR GAVE HER A SAMPLE ONE TIME DOSE ON 03/20/20. NO PRESCRIPTION           Assessment:   Goals Addressed            This Visit's Progress     Patient Stated   . PharmD "I can't afford this medication" (pt-stated)  CARE PLAN ENTRY (see longtitudinal plan of care for additional care plan information)  Current Barriers:  . Polypharmacy; complex patient with multiple comorbidities including HTN, DVT/PE, PVC o Recent dx DVT , then hospitalization for PE. Discharged on Xarelto 15 mg BID x 22 more days then 20 mg daily. Patient does NOT have Medicare Part D, so cost for Xarelto is >$1000 . 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 F/u with vascular in 2  weeks. Cannot afford Xarelto for continuation dose of 20 mg daily o HTN: olmesartan 20 mg daily, HCTZ 12.5 mg daily, carvedilol 3.125 mg BID; BP at office visits well controlled  Pharmacist Clinical Goal(s):  Marland Kitchen Over the next 90 days, patient will work with PharmD and provider towards optimized medication management  Interventions: . Comprehensive medication review performed; medication list updated in electronic medical record . Inter-disciplinary care team collaboration (see longitudinal plan of care) . Reviewed medication and income information. Patient should qualify for Xarelto assistance, as she does not have prescription insurance. Collaborated w/ patient and PCP, collected signatures on application. Will pass along to CPhT for submission and follow up.  . No lipid results on file. Recommend checking moving forward to assess need for cholesterol therapy   Patient Self Care Activities:  . Patient will take medications as prescribed  Initial goal documentation        Plan: - Will collaborate w/ patient and provider as above  Catie Darnelle Maffucci, PharmD, [place] [phone]

## 2020-03-30 NOTE — Patient Instructions (Signed)
Visit Information  Goals Addressed            This Visit's Progress     Patient Stated   . PharmD "I can't afford this medication" (pt-stated)       CARE PLAN ENTRY (see longtitudinal plan of care for additional care plan information)  Current Barriers:  . Polypharmacy; complex patient with multiple comorbidities including HTN, DVT/PE, PVC o Recent dx DVT , then hospitalization for PE. Discharged on Xarelto 15 mg BID x 22 more days then 20 mg daily. Patient does NOT have Medicare Part D, so cost for Xarelto is >$1000 . 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 F/u with vascular in 2 weeks. Cannot afford Xarelto for continuation dose of 20 mg daily o HTN: olmesartan 20 mg daily, HCTZ 12.5 mg daily, carvedilol 3.125 mg BID; BP at office visits well controlled  Pharmacist Clinical Goal(s):  Marland Kitchen Over the next 90 days, patient will work with PharmD and provider towards optimized medication management  Interventions: . Comprehensive medication review performed; medication list updated in electronic medical record . Inter-disciplinary care team collaboration (see longitudinal plan of care) . Reviewed medication and income information. Patient should qualify for Xarelto assistance, as she does not have prescription insurance. Collaborated w/ patient and PCP, collected signatures on application. Will pass along to CPhT for submission and follow up.  . No lipid results on file. Recommend checking moving forward to assess need for cholesterol therapy   Patient Self Care Activities:  . Patient will take medications as prescribed  Initial goal documentation        Patient verbalizes understanding of instructions provided today.   Plan:  - Will collaborate w/ patient and provider as above  Catie Darnelle Maffucci, PharmD, [place]  [phone]

## 2020-04-03 LAB — COLOGUARD: Cologuard: NEGATIVE

## 2020-04-04 ENCOUNTER — Other Ambulatory Visit: Payer: Self-pay | Admitting: Pharmacy Technician

## 2020-04-04 ENCOUNTER — Ambulatory Visit: Payer: Self-pay | Admitting: Pharmacist

## 2020-04-04 NOTE — Patient Outreach (Signed)
[place] [facility] Care Management  04/04/2020  Cindy Stein 1956/04/22 [id number]   Sent inbasket message to embedded [facility] RPh Catie Darnelle Maffucci in regards to patient's [employer] application for Xarelto.  Informed RPh that we were in need of updated proof of income for patient. Spouse's income was sufficient.  Will await return of updated proof of income to submit application.  [name] P. [name], [place]  [phone]

## 2020-04-04 NOTE — Chronic Care Management (AMB) (Signed)
  Chronic Care Management   Note  04/04/2020 Name: Cindy Stein MRN: [id number] DOB: 11-04-1956  Cindy Stein is a 64 y.o. year old female who is a primary care patient of [name], [name], MD. The CCM team was consulted for assistance with chronic disease management and care coordination needs.    Received message from CPhT that we did not have 2021 income information on file for patient, just her husband. Contacted patient. She is going to find her 2021 [facility] letter and bring by clinic to be scanned to me.   Follow up plan: Catie Darnelle Maffucci, PharmD, Para [place], [place] Pharmacist [place] [phone]

## 2020-04-06 ENCOUNTER — Ambulatory Visit
Admission: RE | Admit: 2020-04-06 | Discharge: 2020-04-06 | Disposition: A | Discharge status: Home / Self Care | Payer: Medicare Other | Source: Ambulatory Visit | Attending: Family Medicine | Admitting: Family Medicine

## 2020-04-06 ENCOUNTER — Other Ambulatory Visit: Payer: Self-pay | Admitting: Pharmacy Technician

## 2020-04-06 DIAGNOSIS — Z1231 Encounter for screening mammogram for malignant neoplasm of breast: Secondary | ICD-10-CM | POA: Insufficient documentation

## 2020-04-06 NOTE — Patient Outreach (Signed)
[place] [facility]) Care Management  04/06/2020  Cindy Stein [birth year] [id number]   Received both patient and provider portion(s) of patient assistance application(s) for Xarelto. Faxed completed application and required documents into [employer].  Will follow up with company(ies) in 7-14 business days to check status of application(s).  Cindy Stein P. [name], [place]  [phone]

## 2020-04-07 ENCOUNTER — Other Ambulatory Visit: Payer: Self-pay

## 2020-04-07 ENCOUNTER — Ambulatory Visit (INDEPENDENT_AMBULATORY_CARE_PROVIDER_SITE_OTHER): Payer: Medicare Other

## 2020-04-07 ENCOUNTER — Ambulatory Visit (INDEPENDENT_AMBULATORY_CARE_PROVIDER_SITE_OTHER): Payer: Medicare Other | Admitting: Nurse Practitioner

## 2020-04-07 ENCOUNTER — Encounter (INDEPENDENT_AMBULATORY_CARE_PROVIDER_SITE_OTHER): Payer: Self-pay | Admitting: Nurse Practitioner

## 2020-04-07 VITALS — BP 107/69 | HR 65 | Resp 16 | Wt 201.6 lb

## 2020-04-07 DIAGNOSIS — I2699 Other pulmonary embolism without acute cor pulmonale: Secondary | ICD-10-CM

## 2020-04-07 DIAGNOSIS — I82402 Acute embolism and thrombosis of unspecified deep veins of left lower extremity: Secondary | ICD-10-CM | POA: Diagnosis not present

## 2020-04-07 DIAGNOSIS — I82412 Acute embolism and thrombosis of left femoral vein: Secondary | ICD-10-CM

## 2020-04-07 DIAGNOSIS — I10 Essential (primary) hypertension: Secondary | ICD-10-CM

## 2020-04-10 ENCOUNTER — Encounter (INDEPENDENT_AMBULATORY_CARE_PROVIDER_SITE_OTHER): Payer: Self-pay | Admitting: Nurse Practitioner

## 2020-04-10 NOTE — Progress Notes (Signed)
Subjective:    Patient ID: Cindy Stein, female    DOB: 1956/01/01, 64 y.o.   MRN: [id number] Chief Complaint  Patient presents with  . Follow-up    [facility] 2wk pulmonary embolism    Today the patient presents for noninvasive studies regarding a DVT of the left lower extremity in addition to pulmonary embolism.  The patient presented to [facility] on 03/21/2020 with lower extremity swelling in addition to shortness of breath.  She was subsequently found to have a DVT in the left lower extremity that extended from the left femoral, popliteal, and tibial veins.  She was also found to have a pulmonary embolism.  The patient underwent:  1.  Contrast injection right heart             2.  Thrombolysis bilateral pulmonary arteries             3.  Mechanical thrombectomy bilaterally pulmonary arteries             4.  Selective catheter placement right middle lobe and lower lobe pulmonary arteries             5.  Selective catheter placement left upper and lower lobe pulmonary arteries  Following the procedure the patient shortness of breath was greatly reduced.  The patient was started on Eliquis and she is currently tolerating Eliquis well without issue.  Patient still has some swelling in her left lower extremity at this time.  She has not yet been utilizing medical grade 1 compression.  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  This is greatly improved from her previous study on 03/20/2020.   Review of Systems  Respiratory: Positive for shortness of breath.   Cardiovascular: Positive for leg swelling.  All other systems reviewed and are negative.      Objective:   Physical Exam Vitals reviewed.  Constitutional:      Appearance: Normal appearance.  HENT:     Head: Normocephalic.  Cardiovascular:     Rate and Rhythm: Normal rate and regular rhythm.     Pulses: Normal pulses.     Heart sounds: Normal heart  sounds.  Pulmonary:     Effort: Pulmonary effort is normal.     Breath sounds: Normal breath sounds.  Musculoskeletal:        General: Normal range of motion.     Left lower leg: Edema present.  Neurological:     Mental Status: She is alert and oriented to person, place, and time.  Psychiatric:        Mood and Affect: Mood normal.        Behavior: Behavior normal.        Thought Content: Thought content normal.        Judgment: Judgment normal.     BP 107/69 (BP Location: Right Arm)   Pulse 65   Resp 16   Wt 201 lb 9.6 oz (91.4 kg)   BMI 34.60 kg/m   Past Medical History:  Diagnosis Date  . Arthritis of left ankle   . Arthritis of left knee   . Hypertension   . Plantar fascial fibromatosis   . Pulmonary embolus ([place]) 03/21/2020  . PVC (premature ventricular contraction)   . Unspecified osteoarthritis, unspecified site   . Vitamin D deficiency     Social History   Socioeconomic History  . Marital status: Married    Spouse name: Jeno  .  Number of children: 1  . Years of education: Not on file  . Highest education level: Not on file  Occupational History  . 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 Smoking status: Never Smoker  . Smokeless tobacco: Never Used  Substance and Sexual Activity  . Alcohol use: Yes    Comment: occass  . Drug use: Never  . Sexual activity: Not Currently  Other Topics Concern  . Not on file  Social History Narrative   Lives at home with spouse   Social Determinants of Health   Financial Resource Strain:   . Difficulty of Paying Living Expenses:   Food Insecurity:   . Worried About Charity fundraiser in the Last Year:   . Arboriculturist in the Last Year:   Transportation Needs:   . Film/video editor (Medical):   Marland Kitchen Lack of Transportation (Non-Medical):   Physical Activity:   . Days of Exercise per Week:   . Minutes of Exercise per Session:   Stress:   . Feeling of Stress :   Social  Connections:   . Frequency of Communication with Friends and Family:   . Frequency of Social Gatherings with Friends and Family:   . Attends Religious Services:   . Active Member of Clubs or Organizations:   . Attends Archivist Meetings:   Marland Kitchen Marital Status:   Intimate Partner Violence:   . Fear of Current or Ex-Partner:   . Emotionally Abused:   Marland Kitchen Physically Abused:   . Sexually Abused:     Past Surgical History:  Procedure Laterality Date  . CARDIAC CATHETERIZATION  2006  . carpel tunnel    . GALLBLADDER SURGERY    . LEFT HEART CATH AND CORONARY ANGIOGRAPHY Left 06/14/2019   Procedure: LEFT HEART CATH AND CORONARY ANGIOGRAPHY;  Surgeon: Wellington Hampshire, MD;  Location: [facility];  Service: Cardiovascular;  Laterality: Left;  . PULMONARY THROMBECTOMY Bilateral 03/22/2020   Procedure: PULMONARY THROMBECTOMY / THROMBOLYSIS;  Surgeon: Algernon Huxley, MD;  Location: [facility];  Service: Cardiovascular;  Laterality: Bilateral;    Family History  Problem Relation Age of Onset  . Asthma Mother   . Asthma Father   . Heart failure Father   . Leukemia Sister   . Breast cancer Neg Hx     No Known Allergies     Assessment & Plan:   1. Pulmonary embolism, unspecified chronicity, unspecified pulmonary embolism type, unspecified whether acute cor pulmonale present [facility]) The patient had what is considered an unprovoked pulmonary embolism.  Today the patient is not necessarily at baseline however her shortness of breath is greatly decreased.  Based on the fact that her DVT/pulmonary embolism was unprovoked, we will plan her on anticoagulation for at least 6 months up to a year possibly.  If the patient continues to have worsening shortness of breath she should follow-up with her primary care for possible referral to a pulmonologist.  2. 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  Patient is advised to utilize medical grade 1 compression stockings to keep control of her lower extremity edema.  Patient should also elevate her lower extremities when possible.  She should also exercise at least 30 minutes a day.  3. Essential hypertension Continue antihypertensive medications as already ordered, these medications have been  reviewed and there are no changes at this time.    Current Outpatient Medications on File Prior to Visit  Medication Sig Dispense Refill  . acetaminophen (TYLENOL) 325 MG tablet Take 2 tablets (650 mg total) by mouth every 6 (six) hours as needed for mild pain (or Fever >/= 101).    . carvedilol (COREG) 3.125 MG tablet Take 3.125 mg by mouth 2 (two) times daily with a meal.    . hydrochlorothiazide (HYDRODIURIL) 12.5 MG tablet Take 12.5 mg by mouth daily.    Marland Kitchen olmesartan (BENICAR) 20 MG tablet Take 20 mg by mouth daily.    Marland Kitchen RIVAROXABAN (XARELTO) VTE STARTER PACK (15 & 20 MG TABLETS) Follow package directions: Take one 15mg  tablet by mouth twice a day. On day 22, switch to one 20mg  tablet once a day. Take with food. 51 each 0   No current facility-administered medications on file prior to visit.    There are no Patient Instructions on file for this visit. No follow-ups on file.   Kris Hartmann, NP

## 2020-04-11 ENCOUNTER — Telehealth: Payer: Self-pay

## 2020-04-11 ENCOUNTER — Telehealth: Payer: Self-pay | Admitting: Family Medicine

## 2020-04-11 DIAGNOSIS — R918 Other nonspecific abnormal finding of lung field: Secondary | ICD-10-CM

## 2020-04-11 NOTE — Telephone Encounter (Signed)
I called and LVM for the patient to call back for a message about her CT scan.  Kalman Nylen,cma

## 2020-04-11 NOTE — Telephone Encounter (Signed)
Please call the patient and let her know the CT scan scheduled for 5/12 was mistakenly scheduled too early. We will get this rescheduled for July. I placed another order.

## 2020-04-11 NOTE — Telephone Encounter (Signed)
Patient called back and I informed her of her Mammogram results and I informed her that she did not have to go get her CT scan on 04/12/2020 it will be changed to July and someone will call her to schedule and she understood.  Arcadia Gorgas,cma

## 2020-04-12 ENCOUNTER — Ambulatory Visit: Admission: RE | Admit: 2020-04-12 | Payer: Medicare Other | Source: Ambulatory Visit

## 2020-04-13 ENCOUNTER — Telehealth: Payer: Self-pay | Admitting: Family Medicine

## 2020-04-13 MED ORDER — HYDROCHLOROTHIAZIDE 12.5 MG PO TABS: 12.5000 mg | ORAL_TABLET | Freq: Every day | ORAL | 1 refills | Status: DC

## 2020-04-13 MED ORDER — OLMESARTAN MEDOXOMIL 20 MG PO TABS: 20.0000 mg | ORAL_TABLET | Freq: Every day | ORAL | 1 refills | Status: DC

## 2020-04-13 NOTE — Telephone Encounter (Signed)
Pt would like a 90 day supply of blood pressure medication

## 2020-04-14 ENCOUNTER — Other Ambulatory Visit: Payer: Self-pay | Admitting: Pharmacy Technician

## 2020-04-14 NOTE — Patient Outreach (Signed)
[place] [facility]) Care Management  04/14/2020  Kataleia Whitler 16-May-1956 [id number]  Care coordination call placed to [employer] in regards to patient's Xarelto application.  Spoke to [place] who informed they were in need of the ICD code and the provider's DEA number. She informed this information can be provided by phone.  Outreached embedded [facility] RPh Catie Darnelle Maffucci and was given the following information to provide to [employer]: I82.402, I26.99 for the diagnosis code and she provided the [place] number.  Called [employer] back and spoke to [place] Provided this information to her and she informed she would send it to the processing team. She informed that could take up to another 5-7 business days.  Will follow up with [employer] in 5-7 business days. Will route note embedded [facility] RPH Catie Darnelle Maffucci as FYI and to inquire if patient has adequate supply to avoid a delay in therapy.  Okley Magnussen P. Aziel Morgan, [place]  [phone]

## 2020-04-17 ENCOUNTER — Ambulatory Visit (INDEPENDENT_AMBULATORY_CARE_PROVIDER_SITE_OTHER): Payer: Medicare Other | Admitting: Pharmacist

## 2020-04-17 DIAGNOSIS — I82402 Acute embolism and thrombosis of unspecified deep veins of left lower extremity: Secondary | ICD-10-CM

## 2020-04-17 DIAGNOSIS — I2699 Other pulmonary embolism without acute cor pulmonale: Secondary | ICD-10-CM

## 2020-04-17 DIAGNOSIS — I10 Essential (primary) hypertension: Secondary | ICD-10-CM

## 2020-04-17 NOTE — Chronic Care Management (AMB) (Signed)
Chronic Care Management   Follow Up Note   04/17/2020 Name: Cindy Stein MRN: [id number] DOB: 02-27-1956  Referred by: Cindy Haven, MD Reason for referral : Chronic Care Management (Medication Management)   Cindy Stein is a 64 y.o. year old female who is a primary care patient of Cindy Stein, Cindy Adam, MD. The CCM team was consulted for assistance with chronic disease management and care coordination needs.    Met with patient face to face with her husband.  Review of patient status, including review of consultants reports, relevant laboratory and other test results, and collaboration with appropriate care team members and the patient's provider was performed as part of comprehensive patient evaluation and provision of chronic care management services.    SDOH (Social Determinants of Health) assessments performed: Yes See Care Plan activities for detailed interventions related to Cindy Stein)     Outpatient Encounter Medications as of 04/17/2020  Medication Sig Note  . carvedilol (COREG) 3.125 MG tablet Take 3.125 mg by mouth 2 (two) times daily with a meal.   . hydrochlorothiazide (HYDRODIURIL) 12.5 MG tablet Take 1 tablet (12.5 mg total) by mouth daily.   Marland Kitchen olmesartan (BENICAR) 20 MG tablet Take 1 tablet (20 mg total) by mouth daily. 04/17/2020: Taking in combination w/ HCTZ  . RIVAROXABAN (XARELTO) VTE STARTER PACK (15 & 20 MG TABLETS) Follow package directions: Take one 52m tablet by mouth twice a day. On day 22, switch to one 239mtablet once a day. Take with food. 03/21/2020: PT STATED DR GAVE HER A SAMPLE ONE TIME DOSE ON 03/20/20. NO PRESCRIPTION  . acetaminophen (TYLENOL) 325 MG tablet Take 2 tablets (650 mg total) by mouth every 6 (six) hours as needed for mild pain (or Fever >/= 101).    No facility-administered encounter medications on file as of 04/17/2020.     Objective:   Goals Addressed            This Visit's Progress     Patient Stated   . PharmD "I can't  afford this medication" (pt-stated)       CARE PLAN ENTRY (see longtitudinal plan of care for additional care plan information)  Current Barriers:  . Polypharmacy; complex patient with multiple comorbidities including HTN, DVT/PE, PVC o Recent dx DVT , then hospitalization for PE. Discharged on Xarelto 15 mg BID x 22 more days then 20 mg daily. Patient does NOT have Medicare Part D, so cost for Xarelto is >$1000 o Applied for Xarelto patient assistance. CPhT called last week and confirmed ICD information and Dr. SoEllen HenriEA . 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 Met w/ vascular, tx for at least 6 months recommended. Notes that she is completing 15 mg BID dose, starting 20 mg daily tomorrow. Has 9 tablets.  o HTN: olmesartan 20 mg daily, HCTZ 12.5 mg daily, carvedilol 3.125 mg BID; reports home BP this morning 120/78, similarly well controlled BP at recent office visit  Pharmacist Clinical Goal(s):  . Marland Kitchenver the next 90 days, patient will work with PharmD and provider towards optimized medication management  Interventions: . Comprehensive medication review performed; medication list updated in electronic medical record . Inter-disciplinary care team collaboration (see longitudinal plan of care) . Contacted [employer] to follow up, given patient's low supply of medication at home. They note they are still processing the application, and cannot expedite. Will continue to collaborate w/ CPhT on this process . Patient concerned about receiving COVID  vaccine and risk of blood clot, d/t recent blood clot. Reviewed that thromboembolic complications have not been seen w/ mRNA vaccines Therapist, music or [place]). Reviewed benefits of vaccination and risk of contracting COVID given her comorbidities. Reviewed relative risks of thromboembolic complications w/ COVID vaccine vs COVID, baseline population, smoking, birth control, etc. Encouraged  patient to consider vaccination, and to contact myself or Dr. Caryl Stein with any questions.   Patient Self Care Activities:  . Patient will take medications as prescribed  Please see past updates related to this goal by clicking on the "Past Updates" button in the selected goal          Plan:  - Will continue to collaborate w/ provider and CPhT as above  Catie Darnelle Maffucci, PharmD, [place], [place] Pharmacist [place] [phone]

## 2020-04-17 NOTE — Patient Instructions (Signed)
Visit Information  Goals Addressed            This Visit's Progress     Patient Stated   . PharmD "I can't afford this medication" (pt-stated)       CARE PLAN ENTRY (see longtitudinal plan of care for additional care plan information)  Current Barriers:  . Polypharmacy; complex patient with multiple comorbidities including HTN, DVT/PE, PVC o Recent dx DVT , then hospitalization for PE. Discharged on Xarelto 15 mg BID x 22 more days then 20 mg daily. Patient does NOT have Medicare Part D, so cost for Xarelto is >$1000 o Applied for Xarelto patient assistance. CPhT called last week and confirmed ICD information and Dr. Ellen Henri DEA . 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 Met w/ vascular, tx for at least 6 months recommended. Notes that she is completing 15 mg BID dose, starting 20 mg daily tomorrow. Has 9 tablets.  o HTN: olmesartan 20 mg daily, HCTZ 12.5 mg daily, carvedilol 3.125 mg BID; reports home BP this morning 120/78, similarly well controlled BP at recent office visit  Pharmacist Clinical Goal(s):  Marland Kitchen Over the next 90 days, patient will work with PharmD and provider towards optimized medication management  Interventions: . Comprehensive medication review performed; medication list updated in electronic medical record . Inter-disciplinary care team collaboration (see longitudinal plan of care) . Contacted [employer] to follow up, given patient's low supply of medication at home. They note they are still processing the application, and cannot expedite. Will continue to collaborate w/ CPhT on this process  Patient Self Care Activities:  . Patient will take medications as prescribed  Please see past updates related to this goal by clicking on the "Past Updates" button in the selected goal         Patient verbalizes understanding of instructions provided today.   Plan:  - Will continue to collaborate w/  provider and CPhT as above  Catie Darnelle Maffucci, PharmD, [place], [place] Pharmacist [place] [phone]

## 2020-04-19 ENCOUNTER — Telehealth: Payer: Self-pay

## 2020-04-20 ENCOUNTER — Other Ambulatory Visit: Payer: Self-pay | Admitting: Pharmacy Technician

## 2020-04-20 ENCOUNTER — Ambulatory Visit: Payer: Self-pay | Admitting: Pharmacist

## 2020-04-20 DIAGNOSIS — I82402 Acute embolism and thrombosis of unspecified deep veins of left lower extremity: Secondary | ICD-10-CM

## 2020-04-20 DIAGNOSIS — I10 Essential (primary) hypertension: Secondary | ICD-10-CM

## 2020-04-20 DIAGNOSIS — I2699 Other pulmonary embolism without acute cor pulmonale: Secondary | ICD-10-CM

## 2020-04-20 DIAGNOSIS — I493 Ventricular premature depolarization: Secondary | ICD-10-CM

## 2020-04-20 MED ORDER — RIVAROXABAN 20 MG PO TABS: 20.0000 mg | ORAL_TABLET | Freq: Every day | ORAL | 2 refills | Status: DC

## 2020-04-20 NOTE — Chronic Care Management (AMB) (Signed)
Chronic Care Management   Follow Up Note   04/20/2020 Name: Cindy Stein MRN: [id number] DOB: 1956-11-10  Referred by: Leone Haven, MD Reason for referral : Chronic Care Management (Medication Management)   Cindy Stein is a 64 y.o. year old female who is a primary care patient of Cindy Stein, Cindy Adam, MD. The CCM team was consulted for assistance with chronic disease management and care coordination needs.    Care coordination completed today.   Review of patient status, including review of consultants reports, relevant laboratory and other test results, and collaboration with appropriate care team members and the patient's provider was performed as part of comprehensive patient evaluation and provision of chronic care management services.    SDOH (Social Determinants of Health) assessments performed: Yes See Care Plan activities for detailed interventions related to [facility])     Outpatient Encounter Medications as of 04/20/2020  Medication Sig Note  . acetaminophen (TYLENOL) 325 MG tablet Take 2 tablets (650 mg total) by mouth every 6 (six) hours as needed for mild pain (or Fever >/= 101).   . carvedilol (COREG) 3.125 MG tablet Take 3.125 mg by mouth 2 (two) times daily with a meal.   . hydrochlorothiazide (HYDRODIURIL) 12.5 MG tablet Take 1 tablet (12.5 mg total) by mouth daily.   Marland Kitchen olmesartan (BENICAR) 20 MG tablet Take 1 tablet (20 mg total) by mouth daily. 04/17/2020: Taking in combination w/ HCTZ  . rivaroxaban (XARELTO) 20 MG TABS tablet Take 1 tablet (20 mg total) by mouth daily with supper.   . [DISCONTINUED] RIVAROXABAN (XARELTO) VTE STARTER PACK (15 & 20 MG TABLETS) Follow package directions: Take one 52m tablet by mouth twice a day. On day 22, switch to one 268mtablet once a day. Take with food. 03/21/2020: PT STATED DR GAVE HER A SAMPLE ONE TIME DOSE ON 03/20/20. NO PRESCRIPTION   No facility-administered encounter medications on file as of 04/20/2020.      Objective:   Goals Addressed            This Visit's Progress     Patient Stated   . PharmD "I can't afford this medication" (pt-stated)       CARE PLAN ENTRY (see longtitudinal plan of care for additional care plan information)  Current Barriers:  . Polypharmacy; complex patient with multiple comorbidities including HTN, DVT/PE, PVC o APPROVED for Xarelto assistance through [employer] through 04/18/21. BIN [id number] [id number]. 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 Met w/ vascular, tx for at least 6 months recommended.  o HTN: olmesartan 20 mg daily, HCTZ 12.5 mg daily, carvedilol 3.125 mg BID;   Pharmacist Clinical Goal(s):  . Marland Kitchenver the next 90 days, patient will work with PharmD and provider towards optimized medication management  Interventions: . Comprehensive medication review performed; medication list updated in electronic medical record . Inter-disciplinary care team collaboration (see longitudinal plan of care) . Sent continuing dose Xarelto prescription to the pharmacy, along with billing information. Will collaborate w/ CPhT to contact pharmacy to ensure prescription goes through for $0  Patient Self Care Activities:  . Patient will take medications as prescribed  Please see past updates related to this goal by clicking on the "Past Updates" button in the selected goal          Plan:  - Will outreach as previously scheduled  Catie TrDarnelle MaffucciPharmD, BCL[place]CPC[place]harmacist [phone]

## 2020-04-20 NOTE — Patient Outreach (Signed)
[place] [facility]) Care Management  04/20/2020  Cindy Stein 1956-10-23 [id number]  ADDENDUM   Successful call placed to patient regarding patient assistance medication delivery of Xarelto from [employer], HIPAA identifiers verified.   Informed patient that she could pick up the Xarelto from [place] as [facility] approved her for patient assistance until May 2022. Informed her that the cost would be zero as we provided the billing information provided to [place] by [employer] to the pharmacy. Patient was instructed to call her refills in monthly and they would process the prescriptions with this billing information. Patient verbalized understanding. Confirmed patient had name and number as she had no other questions or concerns.  Follow up:  Will route note to embedded [place] for case closure as patient assistance is completed and will remove myself from care team.  Luiz Ochoa. [name], [place]  [phone]

## 2020-04-20 NOTE — Patient Instructions (Signed)
Visit Information  Goals Addressed            This Visit's Progress     Patient Stated   . PharmD "I can't afford this medication" (pt-stated)       CARE PLAN ENTRY (see longtitudinal plan of care for additional care plan information)  Current Barriers:  . Polypharmacy; complex patient with multiple comorbidities including HTN, DVT/PE, PVC o APPROVED for Xarelto assistance through [employer] through 04/18/21. BIN [id number] Group [id number] ID [id number], . 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 Met w/ vascular, tx for at least 6 months recommended.  o HTN: olmesartan 20 mg daily, HCTZ 12.5 mg daily, carvedilol 3.125 mg BID;   Pharmacist Clinical Goal(s):  Marland Kitchen Over the next 90 days, patient will work with PharmD and provider towards optimized medication management  Interventions: . Comprehensive medication review performed; medication list updated in electronic medical record . Inter-disciplinary care team collaboration (see longitudinal plan of care) . Sent continuing dose Xarelto prescription to the pharmacy, along with billing information. Will collaborate w/ CPhT to contact pharmacy to ensure prescription goes through for $0  Patient Self Care Activities:  . Patient will take medications as prescribed  Please see past updates related to this goal by clicking on the "Past Updates" button in the selected goal         Patient verbalizes understanding of instructions provided today.   Plan:  - Will outreach as previously scheduled  Catie Darnelle Maffucci, PharmD, [place], [place] Pharmacist [place] [phone]

## 2020-04-20 NOTE — Patient Outreach (Signed)
[place] [facility]) Care Management  04/20/2020  Shiffon Dunlop [birth year] [id number]  ADDENDUM  Care coordination call placed to [place] in regards to Xarelto prescription that was faxed over.  Spoke to [place] and she received the prescription. She said to ignore the PA notice that may get faxed over as that is auto generated by their system. She was able to successfully process the Xarelto prescription with the billing information provided by [employer] patient assistance.  Will follow up with patient with this information.  Jahon Bart P. Orel Cooler, [place]  [phone]

## 2020-04-20 NOTE — Patient Outreach (Signed)
[place] [facility]) Care Management  04/20/2020  Cindy Stein 1956/01/19 [id number]  Care coordination call placed to [employer] In regards to Xarelto application.  Spoke to [place] who informed patient was APPROVED 04/18/2020-/04/18/2021. The billing information for the pharmacy is as follows Cindy Stein [id number] ID [id number]  No PCN number is needed for the pharmacy to process claim.  In basket message sent to embedded [place] to inquire if information needed to be sent to a pharmacy or if a new rx needed to be called into patient's pharmacy.  Cindy Stein. Cindy Stein, [place]  [phone]

## 2020-04-26 ENCOUNTER — Other Ambulatory Visit: Payer: Self-pay

## 2020-04-26 ENCOUNTER — Telehealth: Payer: Self-pay

## 2020-04-26 ENCOUNTER — Emergency Department: Payer: Medicare Other

## 2020-04-26 ENCOUNTER — Emergency Department
Admission: EM | Admit: 2020-04-26 | Discharge: 2020-04-26 | Disposition: A | Discharge status: Home / Self Care | Payer: Medicare Other | Attending: Emergency Medicine | Admitting: Emergency Medicine

## 2020-04-26 DIAGNOSIS — R531 Weakness: Secondary | ICD-10-CM | POA: Diagnosis not present

## 2020-04-26 DIAGNOSIS — R0602 Shortness of breath: Secondary | ICD-10-CM | POA: Insufficient documentation

## 2020-04-26 DIAGNOSIS — R0789 Other chest pain: Secondary | ICD-10-CM | POA: Insufficient documentation

## 2020-04-26 DIAGNOSIS — Z5321 Procedure and treatment not carried out due to patient leaving prior to being seen by health care provider: Secondary | ICD-10-CM | POA: Insufficient documentation

## 2020-04-26 DIAGNOSIS — R110 Nausea: Secondary | ICD-10-CM | POA: Diagnosis not present

## 2020-04-26 LAB — CBC
HCT: 44 % (ref 36.0–46.0)
Hemoglobin: 14.6 g/dL (ref 12.0–15.0)
MCH: 29.7 pg (ref 26.0–34.0)
MCHC: 33.2 g/dL (ref 30.0–36.0)
MCV: 89.4 fL (ref 80.0–100.0)
Platelets: 271 10*3/uL (ref 150–400)
RBC: 4.92 MIL/uL (ref 3.87–5.11)
RDW: 13.3 % (ref 11.5–15.5)
WBC: 11.7 10*3/uL — ABNORMAL HIGH (ref 4.0–10.5)
nRBC: 0 % (ref 0.0–0.2)

## 2020-04-26 LAB — BASIC METABOLIC PANEL
Anion gap: 12 (ref 5–15)
BUN: 26 mg/dL — ABNORMAL HIGH (ref 8–23)
CO2: 27 mmol/L (ref 22–32)
Calcium: 10.1 mg/dL (ref 8.9–10.3)
Chloride: 100 mmol/L (ref 98–111)
Creatinine, Ser: 1.61 mg/dL — ABNORMAL HIGH (ref 0.44–1.00)
GFR calc Af Amer: 38 mL/min — ABNORMAL LOW (ref >60)
GFR calc non Af Amer: 33 mL/min — ABNORMAL LOW (ref >60)
Glucose, Bld: 128 mg/dL — ABNORMAL HIGH (ref 70–99)
Potassium: 3.6 mmol/L (ref 3.5–5.1)
Sodium: 139 mmol/L (ref 135–145)

## 2020-04-26 LAB — TROPONIN I (HIGH SENSITIVITY): Troponin I (High Sensitivity): 7 ng/L (ref <18)

## 2020-04-26 MED ORDER — SODIUM CHLORIDE 0.9% FLUSH: 3.0000 mL | Freq: Once | INTRAVENOUS | Status: DC

## 2020-04-26 NOTE — Telephone Encounter (Signed)
I do not think that her symptoms are related to the xarelto. She needs to be seen in person for these symptoms. I would suggest that she go to an urgent care for evaluation as I do not think she would be able to pass the screening questions with her symptoms.

## 2020-04-26 NOTE — Telephone Encounter (Signed)
I called and spoke with the patient and convinced her to go to the urgent care to see what is going on with her.  Damere Brandenburg,cma

## 2020-04-26 NOTE — ED Triage Notes (Signed)
Pt comes via POV from home with c/o discomfort in chest and SOB, pt states she just feels tired and weak. Pt states she feels sweaty.  Pt states this all started earlier today. Pt states some nausea. Pt denies any N/V

## 2020-04-26 NOTE — ED Notes (Signed)
Pt ambulatory to triage with a drink with steady gait with c/o [facility], pt currently being treated for known PE at this time, pt ambulatory without difficulty at this time.

## 2020-04-28 ENCOUNTER — Other Ambulatory Visit: Payer: Self-pay

## 2020-04-28 ENCOUNTER — Telehealth: Payer: Self-pay | Admitting: Family Medicine

## 2020-04-28 ENCOUNTER — Other Ambulatory Visit (INDEPENDENT_AMBULATORY_CARE_PROVIDER_SITE_OTHER): Payer: Medicare Other

## 2020-04-28 DIAGNOSIS — N179 Acute kidney failure, unspecified: Secondary | ICD-10-CM

## 2020-04-28 LAB — BASIC METABOLIC PANEL
BUN: 30 mg/dL — ABNORMAL HIGH (ref 6–23)
CO2: 26 mEq/L (ref 19–32)
Calcium: 9.6 mg/dL (ref 8.4–10.5)
Chloride: 101 mEq/L (ref 96–112)
Creatinine, Ser: 1.08 mg/dL (ref 0.40–1.20)
GFR: 50.72 mL/min — ABNORMAL LOW (ref >60.00)
Glucose, Bld: 120 mg/dL — ABNORMAL HIGH (ref 70–99)
Potassium: 3.5 mEq/L (ref 3.5–5.1)
Sodium: 137 mEq/L (ref 135–145)

## 2020-04-28 NOTE — Telephone Encounter (Signed)
The patient went to the ED to be evaluated though did not stay to see a provider. She left without being seen. Her kidney function was quite a bit worse than when it was last checked and she likely needs some IV fluids and really should go back to the ED for evaluation. Can you call her and see how she is doing? Is she still having chest pain and shortness of breath?

## 2020-04-28 NOTE — Telephone Encounter (Signed)
CMA noted that the patient was feeling well today with no symptoms. Orders placed.

## 2020-04-28 NOTE — Addendum Note (Signed)
Addended by: Leone Haven on: 04/28/2020 09:33 AM   Modules accepted: Orders

## 2020-04-28 NOTE — Telephone Encounter (Signed)
I called and spoke to the patient and she is coming for labs today to recheck kidney.  Nina,cma

## 2020-05-02 ENCOUNTER — Other Ambulatory Visit: Payer: Self-pay

## 2020-05-02 ENCOUNTER — Other Ambulatory Visit (INDEPENDENT_AMBULATORY_CARE_PROVIDER_SITE_OTHER): Payer: Medicare Other

## 2020-05-02 ENCOUNTER — Telehealth: Payer: Self-pay | Admitting: *Deleted

## 2020-05-02 DIAGNOSIS — N179 Acute kidney failure, unspecified: Secondary | ICD-10-CM | POA: Diagnosis not present

## 2020-05-02 LAB — BASIC METABOLIC PANEL
BUN: 20 mg/dL (ref 6–23)
CO2: 30 mEq/L (ref 19–32)
Calcium: 9.6 mg/dL (ref 8.4–10.5)
Chloride: 105 mEq/L (ref 96–112)
Creatinine, Ser: 0.72 mg/dL (ref 0.40–1.20)
GFR: 80.98 mL/min (ref >60.00)
Glucose, Bld: 96 mg/dL (ref 70–99)
Potassium: 4.5 mEq/L (ref 3.5–5.1)
Sodium: 139 mEq/L (ref 135–145)

## 2020-05-02 NOTE — Telephone Encounter (Signed)
Pt has lab appt today. Please place lab orders

## 2020-05-09 ENCOUNTER — Telehealth: Payer: Self-pay

## 2020-05-09 NOTE — Telephone Encounter (Signed)
Patient called today for June 1st lab results and I informed her her labs were back at its normal range. Patient understood.  Cindy Stein,cma

## 2020-05-11 NOTE — Telephone Encounter (Signed)
Note not needed.  Nadine Ryle,cma

## 2020-06-08 ENCOUNTER — Telehealth: Payer: Self-pay | Admitting: Family Medicine

## 2020-06-08 NOTE — Telephone Encounter (Signed)
Copied from [place] [id number]. Topic: Medicare AWV >> Jun 08, 2020 10:54 AM Cher Nakai R wrote: Reason for CRM: Left message for patient to call back and schedule Medicare Annual Wellness Visit (AWV) either virtually or audio only.  No hx of AWV; please schedule at anytime with Denisa O'Brien-Blaney at [facility]

## 2020-06-14 ENCOUNTER — Ambulatory Visit (INDEPENDENT_AMBULATORY_CARE_PROVIDER_SITE_OTHER): Payer: Medicare Other

## 2020-06-14 ENCOUNTER — Ambulatory Visit: Payer: Medicare Other

## 2020-06-14 VITALS — BP 120/82 | HR 65 | Ht 63.0 in | Wt 198.0 lb

## 2020-06-14 DIAGNOSIS — Z0000 Encounter for general adult medical examination without abnormal findings: Secondary | ICD-10-CM

## 2020-06-14 NOTE — Patient Instructions (Addendum)
Cindy Stein , Thank you for taking time to come for your Medicare Wellness Visit. I appreciate your ongoing commitment to your health goals. Please review the following plan we discussed and let me know if I can assist you in the future.   These are the goals we discussed: Goals      Patient Stated   .  PharmD "I can't afford this medication" (pt-stated)      CARE PLAN ENTRY (see longtitudinal plan of care for additional care plan information)  Current Barriers:  . Polypharmacy; complex patient with multiple comorbidities including HTN, DVT/PE, PVC o APPROVED for Xarelto assistance through [employer] through 04/18/21. BIN [id number] Group [id number] ID [id number], . 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 Met w/ vascular, tx for at least 6 months recommended.  o HTN: olmesartan 20 mg daily, HCTZ 12.5 mg daily, carvedilol 3.125 mg BID;   Pharmacist Clinical Goal(s):  Marland Kitchen Over the next 90 days, patient will work with PharmD and provider towards optimized medication management  Interventions: . Comprehensive medication review performed; medication list updated in electronic medical record . Inter-disciplinary care team collaboration (see longitudinal plan of care) . Sent continuing dose Xarelto prescription to the pharmacy, along with billing information. Will collaborate w/ CPhT to contact pharmacy to ensure prescription goes through for $0  Patient Self Care Activities:  . Patient will take medications as prescribed  Please see past updates related to this goal by clicking on the "Past Updates" button in the selected goal         This is a list of the screening recommended for you and due dates:  Health Maintenance  Topic Date Due  . COVID-19 Vaccine (1) 06/30/2020*  . DEXA scan (bone density measurement)  06/14/2021*  . Colon Cancer Screening  06/14/2021*  . Tetanus Vaccine  06/14/2021*  .  Hepatitis C: One time  screening is recommended by Center for Disease Control  (CDC) for  adults born from [birth year] through 1967.   06/14/2021*  . Pneumonia vaccines (1 of 2 - PCV13) 06/14/2021*  . Flu Shot  07/02/2020  . Mammogram  04/06/2022  *Topic was postponed. The date shown is not the original due date.    Immunizations There is no immunization history on file for this patient.  Keep all routine maintenance appointments.   Follow up 06/27/20 @ 8:30  Advanced directives: End of life planning; Advance aging; Advanced directives discussed.  Copy of current HCPOA/Living Will requested.    Conditions/risks identified: none new  Follow up in one year for your annual wellness visit.   Preventive Care 66 Years and Older, Female Preventive care refers to lifestyle choices and visits with your health care provider that can promote health and wellness. What does preventive care include?  A yearly physical exam. This is also called an annual well check.  Dental exams once or twice a year.  Routine eye exams. Ask your health care provider how often you should have your eyes checked.  Personal lifestyle choices, including:  Daily care of your teeth and gums.  Regular physical activity.  Eating a healthy diet.  Avoiding tobacco and drug use.  Limiting alcohol use.  Practicing safe sex.  Taking low-dose aspirin every day.  Taking vitamin and mineral supplements as recommended by your health care provider. What happens during an annual well check? The services and screenings done by your health care provider during your annual well check will  depend on your age, overall health, lifestyle risk factors, and family history of disease. Counseling  Your health care provider may ask you questions about your:  Alcohol use.  Tobacco use.  Drug use.  Emotional well-being.  Home and relationship well-being.  Sexual activity.  Eating habits.  History of falls.  Memory and ability to understand  (cognition).  Work and work Statistician.  Reproductive health. Screening  You may have the following tests or measurements:  Height, weight, and BMI.  Blood pressure.  Lipid and cholesterol levels. These may be checked every 5 years, or more frequently if you are over 60 years old.  Skin check.  Lung cancer screening. You may have this screening every year starting at age 48 if you have a 30-pack-year history of smoking and currently smoke or have quit within the past 15 years.  Fecal occult blood test (FOBT) of the stool. You may have this test every year starting at age 74.  Flexible sigmoidoscopy or colonoscopy. You may have a sigmoidoscopy every 5 years or a colonoscopy every 10 years starting at age 2.  Hepatitis C blood test.  Hepatitis B blood test.  Sexually transmitted disease (STD) testing.  Diabetes screening. This is done by checking your blood sugar (glucose) after you have not eaten for a while (fasting). You may have this done every 1-3 years.  Bone density scan. This is done to screen for osteoporosis. You may have this done starting at age 45.  Mammogram. This may be done every 1-2 years. Talk to your health care provider about how often you should have regular mammograms. Talk with your health care provider about your test results, treatment options, and if necessary, the need for more tests. Vaccines  Your health care provider may recommend certain vaccines, such as:  Influenza vaccine. This is recommended every year.  Tetanus, diphtheria, and acellular pertussis (Tdap, Td) vaccine. You may need a Td booster every 10 years.  Zoster vaccine. You may need this after age 59.  Pneumococcal 13-valent conjugate (PCV13) vaccine. One dose is recommended after age 80.  Pneumococcal polysaccharide (PPSV23) vaccine. One dose is recommended after age 53. Talk to your health care provider about which screenings and vaccines you need and how often you need  them. This information is not intended to replace advice given to you by your health care provider. Make sure you discuss any questions you have with your health care provider. Document Released: 12/15/2015 Document Revised: 08/07/2016 Document Reviewed: 09/19/2015 Elsevier Interactive Patient Education  2017 Walker Prevention in the Home Falls can cause injuries. They can happen to people of all ages. There are many things you can do to make your home safe and to help prevent falls. What can I do on the outside of my home?  Regularly fix the edges of walkways and driveways and fix any cracks.  Remove anything that might make you trip as you walk through a door, such as a raised step or threshold.  Trim any bushes or trees on the path to your home.  Use bright outdoor lighting.  Clear any walking paths of anything that might make someone trip, such as rocks or tools.  Regularly check to see if handrails are loose or broken. Make sure that both sides of any steps have handrails.  Any raised decks and porches should have guardrails on the edges.  Have any leaves, snow, or ice cleared regularly.  Use sand or salt on walking paths during  winter.  Clean up any spills in your garage right away. This includes oil or grease spills. What can I do in the bathroom?  Use night lights.  Install grab bars by the toilet and in the tub and shower. Do not use towel bars as grab bars.  Use non-skid mats or decals in the tub or shower.  If you need to sit down in the shower, use a plastic, non-slip stool.  Keep the floor dry. Clean up any water that spills on the floor as soon as it happens.  Remove soap buildup in the tub or shower regularly.  Attach bath mats securely with double-sided non-slip rug tape.  Do not have throw rugs and other things on the floor that can make you trip. What can I do in the bedroom?  Use night lights.  Make sure that you have a light by your  bed that is easy to reach.  Do not use any sheets or blankets that are too big for your bed. They should not hang down onto the floor.  Have a firm chair that has side arms. You can use this for support while you get dressed.  Do not have throw rugs and other things on the floor that can make you trip. What can I do in the kitchen?  Clean up any spills right away.  Avoid walking on wet floors.  Keep items that you use a lot in easy-to-reach places.  If you need to reach something above you, use a strong step stool that has a grab bar.  Keep electrical cords out of the way.  Do not use floor polish or wax that makes floors slippery. If you must use wax, use non-skid floor wax.  Do not have throw rugs and other things on the floor that can make you trip. What can I do with my stairs?  Do not leave any items on the stairs.  Make sure that there are handrails on both sides of the stairs and use them. Fix handrails that are broken or loose. Make sure that handrails are as long as the stairways.  Check any carpeting to make sure that it is firmly attached to the stairs. Fix any carpet that is loose or worn.  Avoid having throw rugs at the top or bottom of the stairs. If you do have throw rugs, attach them to the floor with carpet tape.  Make sure that you have a light switch at the top of the stairs and the bottom of the stairs. If you do not have them, ask someone to add them for you. What else can I do to help prevent falls?  Wear shoes that:  Do not have high heels.  Have rubber bottoms.  Are comfortable and fit you well.  Are closed at the toe. Do not wear sandals.  If you use a stepladder:  Make sure that it is fully opened. Do not climb a closed stepladder.  Make sure that both sides of the stepladder are locked into place.  Ask someone to hold it for you, if possible.  Clearly mark and make sure that you can see:  Any grab bars or handrails.  First and last  steps.  Where the edge of each step is.  Use tools that help you move around (mobility aids) if they are needed. These include:  Canes.  Walkers.  Scooters.  Crutches.  Turn on the lights when you go into a dark area. Replace any light bulbs  as soon as they burn out.  Set up your furniture so you have a clear path. Avoid moving your furniture around.  If any of your floors are uneven, fix them.  If there are any pets around you, be aware of where they are.  Review your medicines with your doctor. Some medicines can make you feel dizzy. This can increase your chance of falling. Ask your doctor what other things that you can do to help prevent falls. This information is not intended to replace advice given to you by your health care provider. Make sure you discuss any questions you have with your health care provider. Document Released: 09/14/2009 Document Revised: 04/25/2016 Document Reviewed: 12/23/2014 Elsevier Interactive Patient Education  2017 [employer].

## 2020-06-14 NOTE — Progress Notes (Signed)
Subjective:   Aariel Ems is a 64 y.o. female who presents for an Initial Medicare Annual Wellness Visit.  Review of Systems    No ROS.  Medicare Wellness Virtual Visit.   Cardiac Risk Factors include: advanced age (>23mn, >>16women);hypertension     Objective:    Today's Vitals   06/14/20 1336  BP: 120/82  Pulse: 65  Weight: 198 lb (89.8 kg)  Height: '5\' 3"'  (1.6 m)   Body mass index is 35.07 kg/m.  Advanced Directives 06/14/2020 04/26/2020 03/22/2020 03/22/2020 03/20/2020 06/14/2019 11/23/2018  Does Patient Have a Medical Advance Directive? Yes No Yes Yes No No No  Type of Advance Directive Living will - Living will Living will - - -  Does patient want to make changes to medical advance directive? No - Patient declined - No - Patient declined - - - -  Would patient like information on creating a medical advance directive? - - - - No - Patient declined No - Guardian declined -    Current Medications (verified) Outpatient Encounter Medications as of 06/14/2020  Medication Sig  . acetaminophen (TYLENOL) 325 MG tablet Take 2 tablets (650 mg total) by mouth every 6 (six) hours as needed for mild pain (or Fever >/= 101).  . carvedilol (COREG) 3.125 MG tablet Take 3.125 mg by mouth 2 (two) times daily with a meal.  . hydrochlorothiazide (HYDRODIURIL) 12.5 MG tablet Take 1 tablet (12.5 mg total) by mouth daily.  .Marland Kitchenolmesartan (BENICAR) 20 MG tablet Take 1 tablet (20 mg total) by mouth daily.  . rivaroxaban (XARELTO) 20 MG TABS tablet Take 1 tablet (20 mg total) by mouth daily with supper.   No facility-administered encounter medications on file as of 06/14/2020.    Allergies (verified) Patient has no known allergies.   History: Past Medical History:  Diagnosis Date  . Arthritis of left ankle   . Arthritis of left knee   . Hypertension   . Plantar fascial fibromatosis   . [place]us ([place]  . PVC (premature ventricular contraction)   . Unspecified  osteoarthritis, unspecified site   . Vitamin D deficiency    Past Surgical History:  Procedure Laterality Date  . CARDIAC CATHETERIZATION  2006  . carpel tunnel    . GALLBLADDER SURGERY    . LEFT HEART CATH AND CORONARY ANGIOGRAPHY Left 06/14/2019   Procedure: LEFT HEART CATH AND CORONARY ANGIOGRAPHY;  Surgeon: AWellington Hampshire MD;  [facility];  Service: Cardiovascular;  Laterality: Left;  . PULMONARY THROMBECTOMY Bilateral 03/22/2020   Procedure: PULMONARY THROMBECTOMY / THROMBOLYSIS;  Surgeon: DAlgernon Huxley MD;  [facility];  Service: Cardiovascular;  Laterality: Bilateral;   Family History  Problem Relation Age of Onset  . Asthma Mother   . Asthma Father   . Heart failure Father   . Leukemia Sister   . Breast cancer Neg Hx    Social History   Socioeconomic History  . Marital status: Married    Spouse name: Jeno  . Number of children: 1  . Years of education: Not on file  . Highest education level: Not on file  Occupational History  . 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 Smoking status: Never Smoker  . Smokeless tobacco: Never Used  Vaping Use  . Vaping Use: Never used  Substance and Sexual Activity  . Alcohol use: Yes    Comment: occass  . Drug use: Never  .  Sexual activity: Not Currently  Other Topics Concern  . Not on file  Social History Narrative   Lives at home with spouse   Social Determinants of Health   Financial Resource Strain:   . Difficulty of Paying Living Expenses:   Food Insecurity: No Food Insecurity  . Worried About Charity fundraiser in the Last Year: Never true  . Ran Out of Food in the Last Year: Never true  Transportation Needs: No Transportation Needs  . Lack of Transportation (Medical): No  . Lack of Transportation (Non-Medical): No  Physical Activity:   . Days of Exercise per Week:   . Minutes of Exercise per Session:   Stress: No Stress Concern Present  .  Feeling of Stress : Not at all  Social Connections: Unknown  . Frequency of Communication with Friends and Family: More than three times a week  . Frequency of Social Gatherings with Friends and Family: More than three times a week  . Attends Religious Services: Not on file  . Active Member of Clubs or Organizations: Not on file  . Attends Archivist Meetings: Not on file  . Marital Status: Married    Tobacco Counseling Counseling given: Not Answered   Clinical Intake:  Pre-visit preparation completed: Yes        Diabetes: No  How often do you need to have someone help you when you read instructions, pamphlets, or other written materials from your doctor or pharmacy?: 1 - Never   Interpreter Needed?: No      Activities of Daily Living In your present state of health, do you have any difficulty performing the following activities: 06/14/2020 03/22/2020  Hearing? N N  Vision? N N  Difficulty concentrating or making decisions? N N  Walking or climbing stairs? Y N  Dressing or bathing? N N  Doing errands, shopping? N N  Preparing Food and eating ? N -  Using the Toilet? N -  In the past six months, have you accidently leaked urine? N -  Do you have problems with loss of bowel control? N -  Managing your Medications? N -  Managing your Finances? N -  Housekeeping or managing your Housekeeping? N -  Some recent data might be hidden    Patient Care Team: Leone Haven, MD as PCP - General (Family Medicine) Wellington Hampshire, MD as PCP - Cardiology (Cardiology) De Hollingshead, [facility] as Pharmacist (Pharmacist)  Indicate any recent Medical Services you may have received from other than [facility] providers in the past year (date may be approximate).     Assessment:   This is a routine wellness examination for [place].  I connected with [place] today by telephone and verified that I am speaking with the correct person using two identifiers. Location patient:  home Location provider: work Persons participating in the virtual visit: patient, Marine scientist.    I discussed the limitations, risks, security and privacy concerns of performing an evaluation and management service by telephone and the availability of in person appointments. The patient expressed understanding and verbally consented to this telephonic visit.    Interactive audio and video telecommunications were attempted between this provider and patient, however failed, due to patient having technical difficulties OR patient did not have access to video capability.  We continued and completed visit with audio only.  Some vital signs may be absent or patient reported.   Hearing/Vision screen  Hearing Screening   '125Hz'  '250Hz'  '500Hz'  '1000Hz'  '2000Hz'  '3000Hz'  '4000Hz'   '6000Hz'  '8000Hz'   Right ear:           Left ear:           Comments: Patient is able to hear conversational tones without difficulty.  No issues reported.   Vision Screening Comments: Followed by [facility] Visual acuity not assessed, virtual visit.  They have seen their ophthalmologist in the last 12 months.     Dietary issues and exercise activities discussed: Current Exercise Habits: Home exercise routine, Intensity: MildHealthy diet Good water intake  Goals      Patient Stated   .  PharmD "I can't afford this medication" (pt-stated)      CARE PLAN ENTRY (see longtitudinal plan of care for additional care plan information)  Current Barriers:  . Polypharmacy; complex patient with multiple comorbidities including HTN, DVT/PE, PVC o APPROVED for Xarelto assistance through [employer] through 04/18/21. BIN [id number] Group [id number] ID [id number], . 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 Met w/ vascular, tx for at least 6 months recommended.  o HTN: olmesartan 20 mg daily, HCTZ 12.5 mg daily, carvedilol 3.125 mg BID;   Pharmacist Clinical Goal(s):  Marland Kitchen Over the next 90  days, patient will work with PharmD and provider towards optimized medication management  Interventions: . Comprehensive medication review performed; medication list updated in electronic medical record . Inter-disciplinary care team collaboration (see longitudinal plan of care) . Sent continuing dose Xarelto prescription to the pharmacy, along with billing information. Will collaborate w/ CPhT to contact pharmacy to ensure prescription goes through for $0  Patient Self Care Activities:  . Patient will take medications as prescribed  Please see past updates related to this goal by clicking on the "Past Updates" button in the selected goal        Depression Screen PHQ 2/9 Scores 06/14/2020 03/28/2020  PHQ - 2 Score 0 0    Fall Risk Fall Risk  06/14/2020  Falls in the past year? 0  Number falls in past yr: 0  Follow up Falls evaluation completed    Handrails in use when climbing stairs? Yes  Home free of loose throw rugs in walkways, pet beds, electrical cords, etc? Yes Adequate lighting in your home to reduce risk of falls? Yes   ASSISTIVE DEVICES UTILIZED TO PREVENT FALLS: Use of a cane, walker or w/c? Yes as needed Grab bars in the bathroom? Yes  Shower chair or bench in shower? Yes  Elevated toilet seat or a handicapped toilet? Yes   TIMED UP AND GO:  Was the test performed? No .   Cognitive Function:  Patient is alert and oriented x3. Denies difficulty focusing and with memory.   6CIT Screen 06/14/2020  What Year? 0 points  What month? 0 points  Months in reverse 0 points  Repeat phrase 0 points    Immunizations There is no immunization history on file for this patient.  Health Maintenance Health Maintenance  Topic Date Due  . COVID-19 Vaccine (1) 06/30/2020 (Originally 02/03/1968)  . DEXA SCAN  06/14/2021 (Originally 02/04/2019)  . COLONOSCOPY  06/14/2021 (Originally 02/04/2004)  . TETANUS/TDAP  06/14/2021 (Originally [birth year]03[birth year]1973)  . Hepatitis C Screening   06/14/2021 (Originally [birth year])  . PNA vac Low Risk Adult (1 of 2 - PCV13) 06/14/2021 (Originally 02/04/2019)  . INFLUENZA VACCINE  07/02/2020  . MAMMOGRAM  04/06/2022   Covid vaccine- declined  Colonoscopy- deferred per patient preference Tdap and Pneumococcal vaccine- due. Educational information provided  regarding the importance of the vaccines. Agrees to update office with immunization report if received from local pharmacy or health department.   Dexa Scan- deferred per patient.  Hep C screening- deferred per patient.  Dental Screening: Recommended annual dental exams for proper oral hygiene. Annual visits.  Community Resource Referral / Chronic Care Management: CRR required this visit?  No   CCM required this visit?  No      Plan:   Keep all routine maintenance appointments.   Follow up 06/27/20 @ 8:30  I have personally reviewed and noted the following in the patient's chart:   . Medical and social history . Use of alcohol, tobacco or illicit drugs  . Current medications and supplements . Functional ability and status . Nutritional status . Physical activity . Advanced directives . List of other physicians . Hospitalizations, surgeries, and ER visits in previous 12 months . Vitals . Screenings to include cognitive, depression, and falls . Referrals and appointments  In addition, I have reviewed and discussed with patient certain preventive protocols, quality metrics, and best practice recommendations. A written personalized care plan for preventive services as well as general preventive health recommendations were provided to patient via mychart.     Varney Biles, LPN   6/41/5830

## 2020-06-22 ENCOUNTER — Ambulatory Visit
Admission: RE | Admit: 2020-06-22 | Discharge: 2020-06-22 | Disposition: A | Discharge status: Home / Self Care | Payer: Medicare Other | Source: Home / Self Care | Attending: Family Medicine | Admitting: Family Medicine

## 2020-06-22 ENCOUNTER — Ambulatory Visit
Admission: RE | Admit: 2020-06-22 | Discharge: 2020-06-22 | Disposition: A | Discharge status: Home / Self Care | Payer: Medicare Other | Source: Ambulatory Visit | Attending: Family Medicine | Admitting: Family Medicine

## 2020-06-22 DIAGNOSIS — R918 Other nonspecific abnormal finding of lung field: Secondary | ICD-10-CM | POA: Diagnosis not present

## 2020-06-27 ENCOUNTER — Telehealth: Payer: Self-pay | Admitting: Cardiovascular Disease

## 2020-06-27 ENCOUNTER — Encounter: Payer: Self-pay | Admitting: Family Medicine

## 2020-06-27 ENCOUNTER — Other Ambulatory Visit: Payer: Self-pay

## 2020-06-27 ENCOUNTER — Ambulatory Visit (INDEPENDENT_AMBULATORY_CARE_PROVIDER_SITE_OTHER): Payer: Medicare Other | Admitting: Family Medicine

## 2020-06-27 VITALS — BP 135/80 | HR 59 | Temp 98.8°F | Ht 64.0 in | Wt 203.0 lb

## 2020-06-27 DIAGNOSIS — I2699 Other pulmonary embolism without acute cor pulmonale: Secondary | ICD-10-CM | POA: Diagnosis not present

## 2020-06-27 DIAGNOSIS — R0600 Dyspnea, unspecified: Secondary | ICD-10-CM

## 2020-06-27 DIAGNOSIS — R002 Palpitations: Secondary | ICD-10-CM | POA: Diagnosis not present

## 2020-06-27 DIAGNOSIS — R0609 Other forms of dyspnea: Secondary | ICD-10-CM

## 2020-06-27 DIAGNOSIS — R918 Other nonspecific abnormal finding of lung field: Secondary | ICD-10-CM | POA: Diagnosis not present

## 2020-06-27 LAB — CBC
HCT: 41.3 % (ref 36.0–46.0)
Hemoglobin: 13.9 g/dL (ref 12.0–15.0)
MCHC: 33.7 g/dL (ref 30.0–36.0)
MCV: 88.8 fl (ref 78.0–100.0)
Platelets: 209 10*3/uL (ref 150.0–400.0)
RBC: 4.65 Mil/uL (ref 3.87–5.11)
RDW: 13.2 % (ref 11.5–15.5)
WBC: 6.5 10*3/uL (ref 4.0–10.5)

## 2020-06-27 LAB — COMPREHENSIVE METABOLIC PANEL
ALT: 23 U/L (ref 0–35)
AST: 16 U/L (ref 0–37)
Albumin: 3.8 g/dL (ref 3.5–5.2)
Alkaline Phosphatase: 71 U/L (ref 39–117)
BUN: 19 mg/dL (ref 6–23)
CO2: 29 mEq/L (ref 19–32)
Calcium: 9.4 mg/dL (ref 8.4–10.5)
Chloride: 104 mEq/L (ref 96–112)
Creatinine, Ser: 0.83 mg/dL (ref 0.40–1.20)
GFR: 68.7 mL/min (ref >60.00)
Glucose, Bld: 102 mg/dL — ABNORMAL HIGH (ref 70–99)
Potassium: 3.9 mEq/L (ref 3.5–5.1)
Sodium: 138 mEq/L (ref 135–145)
Total Bilirubin: 0.5 mg/dL (ref 0.2–1.2)
Total Protein: 6.5 g/dL (ref 6.0–8.3)

## 2020-06-27 LAB — TSH: TSH: 4.5 u[IU]/mL (ref 0.35–4.50)

## 2020-06-27 NOTE — Progress Notes (Signed)
[name], MD Phone: [phone]  Cindy Stein is a 64 y.o. female who presents today for f/u.  PE: Patient has been on Xarelto.  She is had no chest pain.  She reports she continues to have some shortness of breath.  Her daughter notes its relatively stable from when she was discharged from the hospital.  She said no wheezing.  She occasionally has minimal cough.  No bleeding issues.  Some two-pillow orthopnea compared to previously sleeping on one pillow or no pillows.  No PND.  Patient does report her pulse at times drops down into the 40s at home.  Hypertension: Typically around 120/80.  Taking olmesartan, HCTZ, and carvedilol.  No edema.  Social History   Tobacco Use  Smoking Status Never Smoker  Smokeless Tobacco Never Used     ROS see history of present illness  Objective  Physical Exam Vitals:   06/27/20 0837  BP: (!) 135/80  Pulse: 59  Temp: 98.8 F (37.1 C)  SpO2: 99%    BP Readings from Last 3 Encounters:  06/27/20 (!) 135/80  06/14/20 120/82  04/07/20 107/69   Wt Readings from Last 3 Encounters:  06/27/20 (!) 203 lb (92.1 kg)  06/14/20 198 lb (89.8 kg)  04/07/20 201 lb 9.6 oz (91.4 kg)    Physical Exam Constitutional:      General: She is not in acute distress.    Appearance: She is not diaphoretic.  Cardiovascular:     Rate and Rhythm: Normal rate and regular rhythm.     Heart sounds: Normal heart sounds.  Pulmonary:     Effort: Pulmonary effort is normal.     Breath sounds: Normal breath sounds.  Musculoskeletal:     Right lower leg: No edema.     Left lower leg: No edema.  Skin:    General: Skin is warm and dry.  Neurological:     Mental Status: She is alert.    EKG: Sinus rhythm, rate 62, low voltage in precordial leads, no apparent significant changes compared to previous  Assessment/Plan: Please see individual problem list.  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  She does continue to have dyspnea that is  relatively stable compared to discharge from the hospital.  Per vascular surgery's last note it appears that they recommended pulmonology evaluation if that continued.  Pulmonology referral will be placed.  She will remain on Xarelto.  Palpitations Patient with occasional palpitations.  She notes occasionally her heart rate drops into the 40s.  EKG today is stable.  We will contact her cardiologist. Labs today.   Pulmonary nodules Follow-up CT with stable pulmonary nodules though possibly revealed pericardial effusion versus lymph nodes.  Echo will be ordered.  I will send a message to her cardiologist so they can review as well.  Patient was advised to seek medical attention if she develops chest pain or worsening breathing issues.   Orders Placed This Encounter  Procedures  . TSH  . CBC  . Comp Met (CMET)  . Ambulatory referral to Pulmonology    Referral Priority:   Routine    Referral Type:   Consultation    Referral Reason:   Specialty Services Required    Requested Specialty:   Pulmonary Disease    Number of Visits Requested:   1  . EKG 12-Lead  . ECHOCARDIOGRAM COMPLETE    Standing Status:   Future    Standing Expiration Date:   06/27/2021    Order Specific Question:   Where  should this test be performed    Answer:   [facility]    Order Specific Question:   Please indicate who you request to read the echo results.    Answer:   [facility]    Order Specific Question:   Perflutren DEFINITY (image enhancing agent) should be administered unless hypersensitivity or allergy exist    Answer:   Administer Perflutren    Order Specific Question:   Is a special reader required? (athlete or structural heart)    Answer:   No    Order Specific Question:   Does this study need to be read by the Structural team/Level 3 readers?    Answer:   No    Order Specific Question:   Reason for exam-Echo    Answer:   Pericardial effusion 423.9 / I31.3    No orders of the defined types  were placed in this encounter.   This visit occurred during the SARS-CoV-2 public health emergency.  Safety protocols were in place, including screening questions prior to the visit, additional usage of staff PPE, and extensive cleaning of exam room while observing appropriate contact time as indicated for disinfecting solutions.   I have spent 40 minutes in the care of this patient regarding history taking, documentation, review of recent imaging, message composition to patient's cardiologist, placing orders, interpretation of EKG.   [name], MD [place]

## 2020-06-27 NOTE — Telephone Encounter (Signed)
Patient calling for asap visit with arida only no app   She is concerned about results from a recent ct and fluid around heart   Patient aware added to waitlist and clinical will be notified of concern / review urgency

## 2020-06-27 NOTE — Patient Instructions (Signed)
Nice to see you. We will check labs today. We are ordering an echo and referring to pulmonology as well. I have sent a message to your cardiologist will contact you when he lets me know his thoughts on your CT findings.   Please continue with the Xarelto. If you develop chest pain or worsening breathing issues please seek medical attention immediately.

## 2020-06-27 NOTE — Assessment & Plan Note (Signed)
Symptomatically fairly stable.  She does continue to have dyspnea that is relatively stable compared to discharge from the hospital.  Per vascular surgery's last note it appears that they recommended pulmonology evaluation if that continued.  Pulmonology referral will be placed.  She will remain on Xarelto.

## 2020-06-27 NOTE — Assessment & Plan Note (Signed)
Follow-up CT with stable pulmonary nodules though possibly revealed pericardial effusion versus lymph nodes.  Echo will be ordered.  I will send a message to her cardiologist so they can review as well.

## 2020-06-27 NOTE — Telephone Encounter (Signed)
Per Dr. Ellen Henri 7//27/21 documentation:  Follow-up CT with stable pulmonary nodules though possibly revealed pericardial effusion versus lymph nodes.  Echo will be ordered.  I will send a message to her cardiologist so they can review as well.  Message fwd to Dr. Fletcher Anon to review and advise.

## 2020-06-27 NOTE — Telephone Encounter (Signed)
I reviewed the CT scan.  I do not think it is anything serious.  I do agree with an echocardiogram to evaluate for pericardial effusion.  Follow-up with me after the echo.

## 2020-06-27 NOTE — Assessment & Plan Note (Addendum)
Patient with occasional palpitations.  She notes occasionally her heart rate drops into the 40s.  EKG today is stable.  We will contact her cardiologist. Labs today.

## 2020-06-28 ENCOUNTER — Ambulatory Visit: Payer: Medicare Other | Admitting: Family Medicine

## 2020-06-29 NOTE — Telephone Encounter (Signed)
Patient is returning your call.  

## 2020-06-29 NOTE — Telephone Encounter (Addendum)
Spoke with the patient. Patient made aware of Dr. Tyrell Antonio response and recommendation.   Advised the patient that her pcp ordered her echo to be done at [facility] but it has not been scheduled as yet. Provider the telephone number for the patient to call and schedule.  She has requested an appt only with Dr. Fletcher Anon and she is scheduled with him on 08/04/20. She has been place on the wait list for a sooner appt if there is a cancellation.   Patient verbalized understanding and voiced appreciation for the call.

## 2020-06-29 NOTE — Telephone Encounter (Signed)
DPR on file. lmom with Dr. Arida's response and recommendation. Patient is to contact the office if any questions. 

## 2020-07-07 ENCOUNTER — Ambulatory Visit (INDEPENDENT_AMBULATORY_CARE_PROVIDER_SITE_OTHER): Payer: Medicare Other

## 2020-07-07 ENCOUNTER — Other Ambulatory Visit: Payer: Self-pay

## 2020-07-07 DIAGNOSIS — R0609 Other forms of dyspnea: Secondary | ICD-10-CM

## 2020-07-07 DIAGNOSIS — R0600 Dyspnea, unspecified: Secondary | ICD-10-CM | POA: Diagnosis not present

## 2020-07-07 LAB — ECHOCARDIOGRAM COMPLETE
AR max vel: 3.1 cm2
AV Area VTI: 2.96 cm2
AV Area mean vel: 2.87 cm2
AV Mean grad: 3 mmHg
AV Peak grad: 5.9 mmHg
Ao pk vel: 1.21 m/s
Area-P 1/2: 3.87 cm2
S' Lateral: 2.3 cm

## 2020-07-07 MED ORDER — PERFLUTREN LIPID MICROSPHERE
1.0000 mL | INTRAVENOUS | Status: AC | PRN
  Administered 2020-07-07: 2 mL via INTRAVENOUS

## 2020-07-11 ENCOUNTER — Telehealth: Payer: Self-pay | Admitting: Cardiovascular Disease

## 2020-07-11 NOTE — Telephone Encounter (Signed)
Patient is very upset nobody has resulted her echo to her

## 2020-07-11 NOTE — Telephone Encounter (Addendum)
Spoke with the patient. Adv her that the finalized echo results were sent to the ordering provider Dr. [name] and his office was to call her with the results.  Adv the patient that Dr. Biagio Quint has reviewed the results.  Cindy Haven, MD  07/08/2020 7:43 AM EDT     Please let the patient know that her echo showed no fluid around her heart. Her heart function appears acceptable.    Adv the patient that I will fwd the message to Dr. Fletcher Anon to review the results and give his recommendation.  Patient verbalized understanding and voiced appreciation for the assistance.

## 2020-07-12 NOTE — Telephone Encounter (Signed)
I reviewed her echocardiogram and I do not see any significant abnormalities.  This is good news.  I do not see a cardiac reason for shortness of breath.

## 2020-07-12 NOTE — Telephone Encounter (Signed)
Attempted to contact the patient with Dr. Tyrell Antonio response and recommendation. Patient phone ring out. Unable to lmom. Will attempt again.

## 2020-07-13 NOTE — Telephone Encounter (Signed)
Patient made aware of Dr. Tyrell Antonio response regarding her echo results.  Patient verbalized understanding.

## 2020-07-13 NOTE — Telephone Encounter (Signed)
Attempted to contact the patient.  Unable to lmom. Pt phone rings out.

## 2020-07-14 ENCOUNTER — Other Ambulatory Visit: Payer: Self-pay | Admitting: Family Medicine

## 2020-07-14 DIAGNOSIS — I2699 Other pulmonary embolism without acute cor pulmonale: Secondary | ICD-10-CM

## 2020-07-14 DIAGNOSIS — I82402 Acute embolism and thrombosis of unspecified deep veins of left lower extremity: Secondary | ICD-10-CM

## 2020-07-19 ENCOUNTER — Telehealth: Payer: Self-pay | Admitting: Family Medicine

## 2020-07-19 NOTE — Telephone Encounter (Signed)
Pt called in she wants a ear doctor her in [place] not [place]

## 2020-07-26 ENCOUNTER — Telehealth: Payer: Self-pay | Admitting: Cardiovascular Disease

## 2020-07-26 NOTE — Telephone Encounter (Signed)
Lft vm for pt to call ofc regarding referral to ENT in [place]?

## 2020-07-26 NOTE — Telephone Encounter (Signed)
Yes that is fine.  She can actually follow-up as needed given that her recent echo did not show significant abnormalities.

## 2020-07-26 NOTE — Telephone Encounter (Addendum)
Called to give the patient Dr. Tyrell Antonio response and recommendation. lmom.

## 2020-07-26 NOTE — Telephone Encounter (Signed)
Patient calling to discuss necessity for upcoming visit.  Per note patient requested appt.  Patient doesn't know if this is still needed since she has already reviewed Results.   Please call

## 2020-07-26 NOTE — Telephone Encounter (Signed)
Patient was last seen for a in office visit with Dr. Fletcher Anon in Aug 2020. Msg fwd to Dr. Fletcher Anon to give his recommendation regarding appropriate time frame for a follow up appt.

## 2020-07-27 ENCOUNTER — Other Ambulatory Visit: Payer: Medicare Other

## 2020-07-29 IMAGING — CT CT CHEST W/O CM
2 of 3 series · 15 of 36 positions shown, 18 images · non-contrast
Comparison: Chest CTA dated 03/21/2020.

CLINICAL DATA: Follow-up bilateral lung nodules.

EXAM:
CT CHEST WITHOUT CONTRAST
TECHNIQUE: Multidetector CT imaging of the chest was performed following the
standard protocol without IV contrast.

[Series 2: thorax · axial · 0.77mm/px · z∈[-131,+161]mm · 12 of 172 slices shown, 15 images]
[im 13/172  mediastinal]
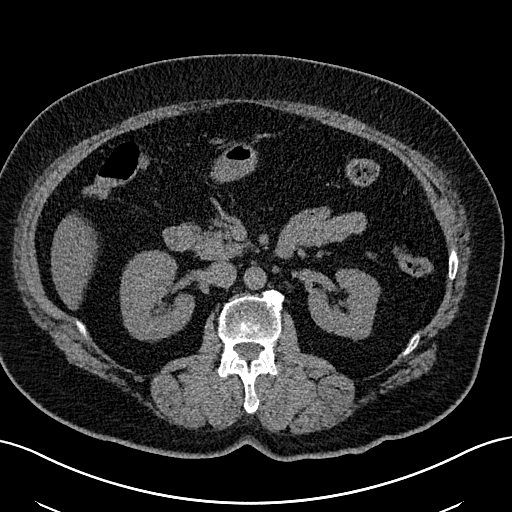
[im 13/172  lung]
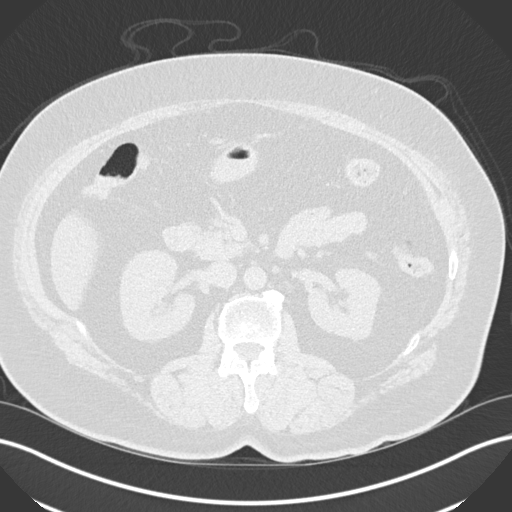
[im 26/172  lung]
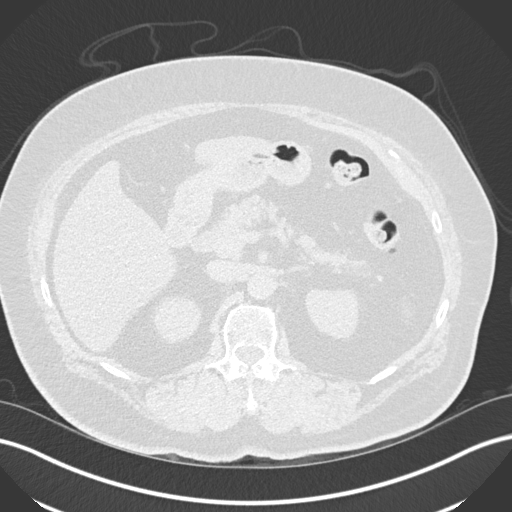
[im 39/172  lung]
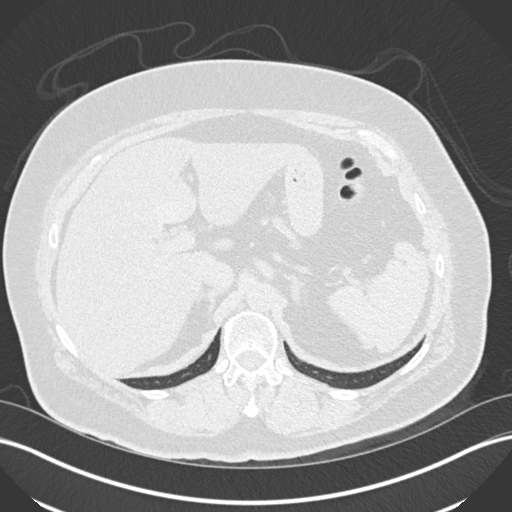
[im 51/172  lung]
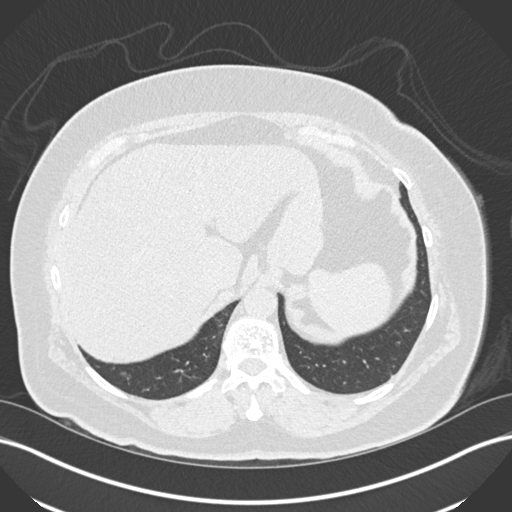
[im 64/172  mediastinal]
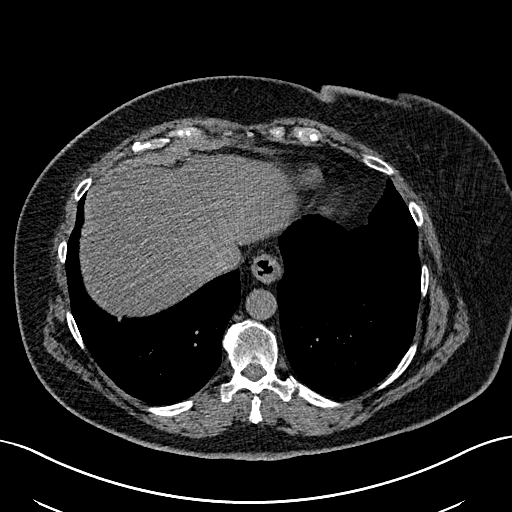
[im 64/172  lung]
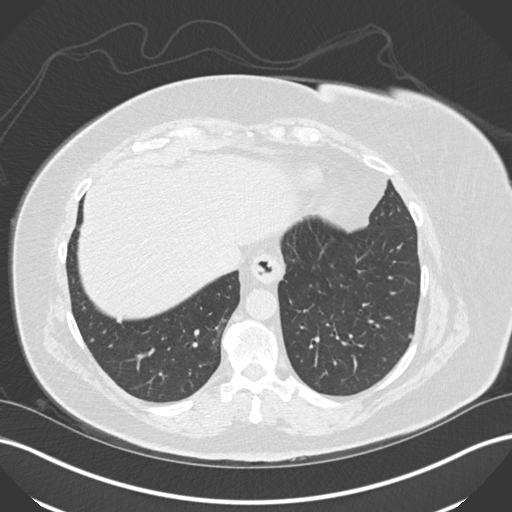
[im 77/172  lung]
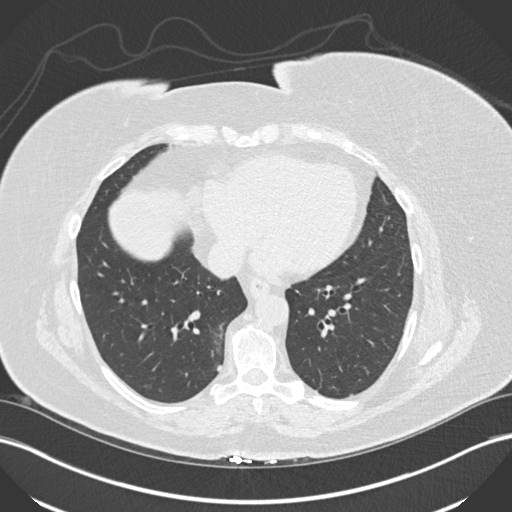
[im 96/172  lung]
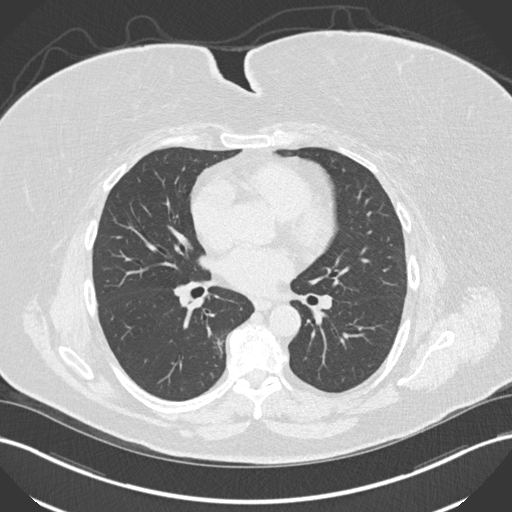
[im 108/172  lung]
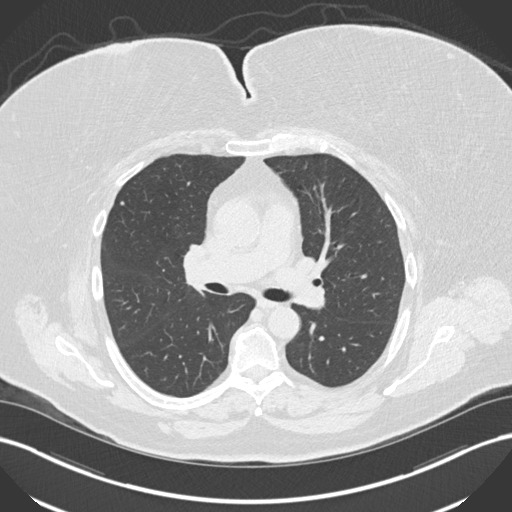
[im 121/172  mediastinal]
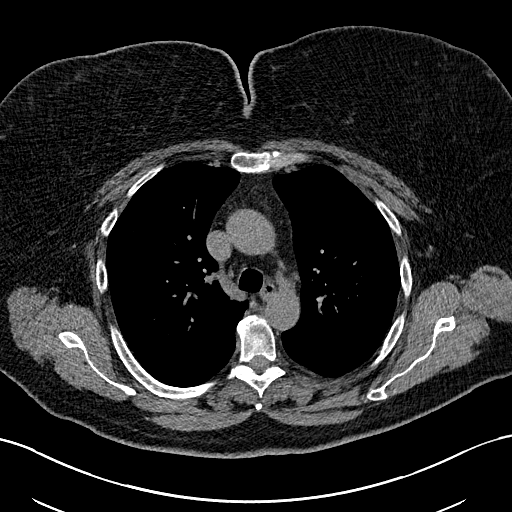
[im 121/172  lung]
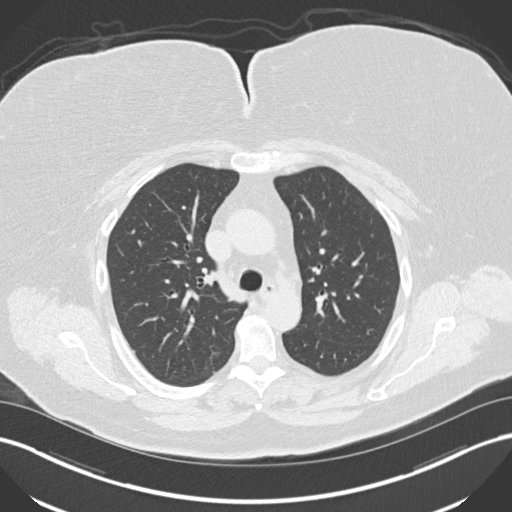
[im 134/172  lung]
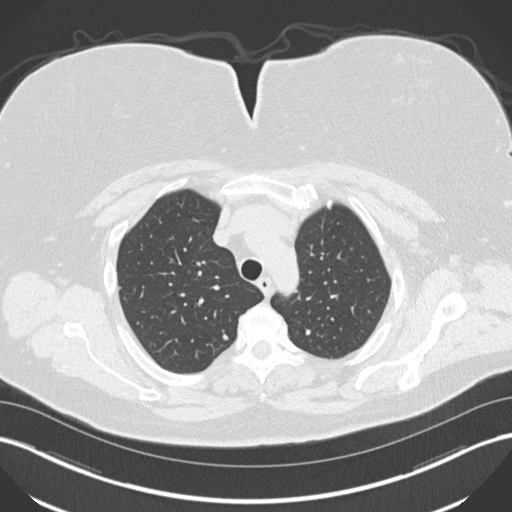
[im 146/172  lung]
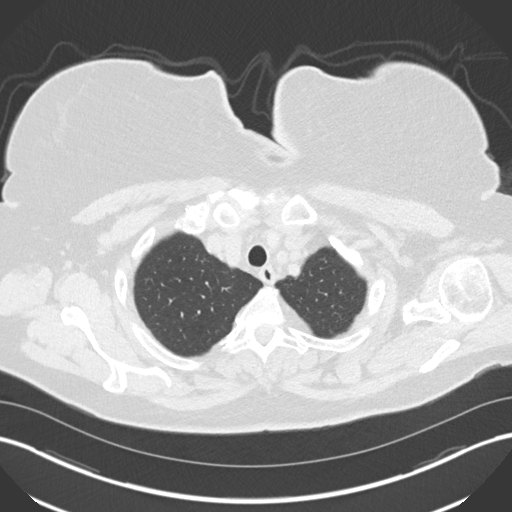
[im 159/172  lung]
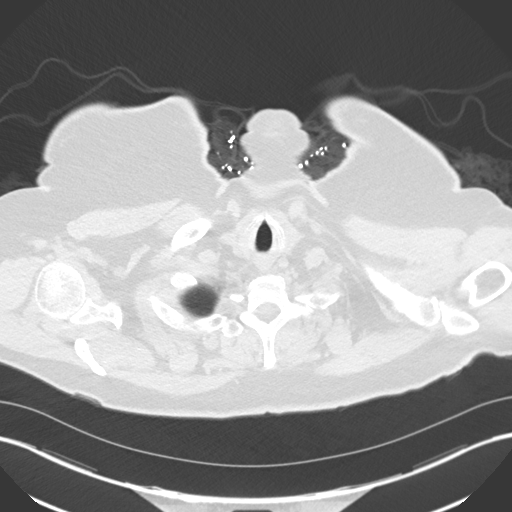

[Series 5: coronal · coronal · 0.71mm/px · 3 of 141 slices shown]
[im 29/141  lung]
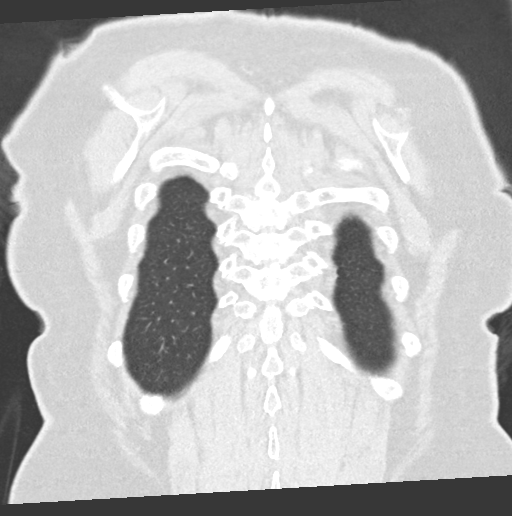
[im 57/141  lung]
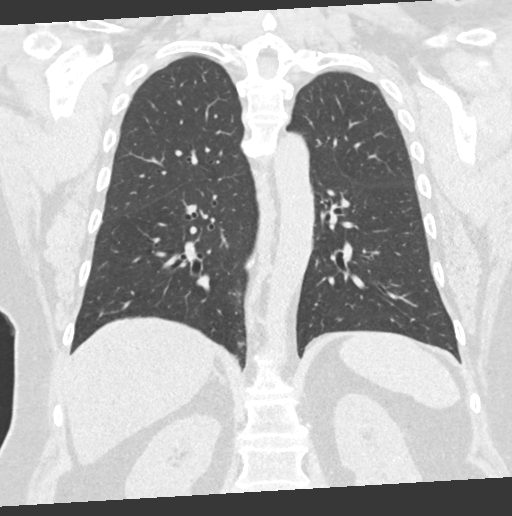
[im 85/141  lung]
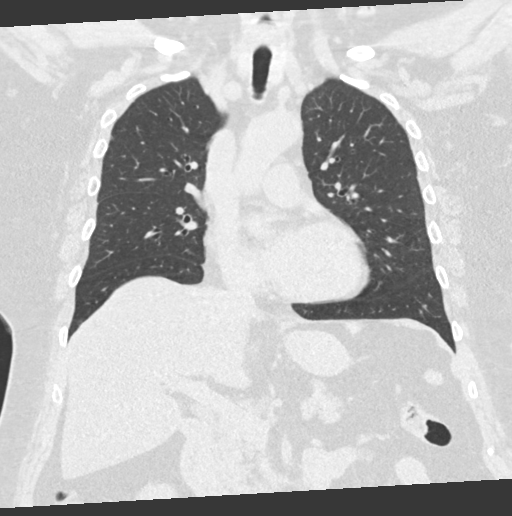

[15 of 36 positions shown; findings below may reference images not displayed]

FINDINGS: Cardiovascular: Minimal atheromatous calcifications, including the
coronary arteries and aorta. Normal sized heart.

Mediastinum/Nodes: Interval enlarged low density right epicardial
lymph nodes or focal loculations of pericardial fluid measuring 14
mm in maximum short axis diameter and 8 Hounsfield units in density.
Otherwise, no enlarged lymph nodes are seen. Unremarkable thyroid
gland and esophagus. Small sliding hiatal hernia.

Lungs/Pleura: There has been no significant change in multiple
subcentimeter lung nodules in both lungs, as previously described.
No new nodules and no pleural fluid.

Upper Abdomen: Mild diffuse low density of the liver relative to the
spleen with sparing adjacent to the gallbladder fossa.
Cholecystectomy clips. Partially included bilateral renal cysts.

Musculoskeletal: Thoracic and lower cervical spine degenerative
changes.
IMPRESSION: 1. No significant change in multiple subcentimeter lung nodules in
both lungs, as previously described. Future CT at 18-24 months (from
the initial scan) is considered optional for low-risk patients, but
is recommended for high-risk patients. This recommendation follows
the consensus statement: Guidelines for Management of Incidental
Pulmonary Nodules Detected on CT Images: From the [HOSPITAL]
2. Interval enlarged low density right epicardial lymph nodes or
focal loculations of pericardial fluid. Loculations of pericardial
fluid are favored.
3. Small sliding hiatal hernia.
4. Mild diffuse hepatic steatosis.
5. Minimal calcific coronary artery and aortic atherosclerosis.

Aortic Atherosclerosis ([id number]).

## 2020-08-01 ENCOUNTER — Ambulatory Visit (INDEPENDENT_AMBULATORY_CARE_PROVIDER_SITE_OTHER): Payer: Medicare Other | Admitting: Otolaryngology

## 2020-08-04 ENCOUNTER — Ambulatory Visit: Payer: Medicare Other | Admitting: Cardiovascular Disease

## 2020-08-08 ENCOUNTER — Ambulatory Visit: Payer: Medicare Other | Admitting: Pharmacist

## 2020-08-08 DIAGNOSIS — R918 Other nonspecific abnormal finding of lung field: Secondary | ICD-10-CM

## 2020-08-08 DIAGNOSIS — I82402 Acute embolism and thrombosis of unspecified deep veins of left lower extremity: Secondary | ICD-10-CM

## 2020-08-08 DIAGNOSIS — I10 Essential (primary) hypertension: Secondary | ICD-10-CM

## 2020-08-08 DIAGNOSIS — R002 Palpitations: Secondary | ICD-10-CM

## 2020-08-08 DIAGNOSIS — I493 Ventricular premature depolarization: Secondary | ICD-10-CM

## 2020-08-08 NOTE — Chronic Care Management (AMB) (Signed)
Chronic Care Management   Follow Up Note   08/08/2020 Name: Cindy Stein MRN: [id number] DOB: [birth year]  Referred by: Cindy Haven, MD Reason for referral : Chronic Care Management (Medication Management)   Cindy Stein is a 64 y.o. year old female who is a primary care patient of Cindy Stein, Cindy Adam, MD. The CCM team was consulted for assistance with chronic disease management and care coordination needs.    While meeting with patient and her husband for his appointment, patient mentioned concerns w/ bilateral leg swelling, wondering if PCP would prescribe "fluid pills" for her.   Review of patient status, including review of consultants reports, relevant laboratory and other test results, and collaboration with appropriate care team members and the patient's provider was performed as part of comprehensive patient evaluation and provision of chronic care management services.    SDOH (Social Determinants of Health) assessments performed: Yes See Care Plan activities for detailed interventions related to SDOH)  SDOH Interventions     Most Recent Value  SDOH Interventions  Financial Strain Interventions Intervention Not Indicated       Outpatient Encounter Medications as of 08/08/2020  Medication Sig Note   carvedilol (COREG) 3.125 MG tablet Take 3.125 mg by mouth 2 (two) times daily with a meal.    hydrochlorothiazide (HYDRODIURIL) 12.5 MG tablet Take 1 tablet (12.5 mg total) by mouth daily.    olmesartan (BENICAR) 20 MG tablet Take 1 tablet (20 mg total) by mouth daily. 04/17/2020: Taking in combination w/ HCTZ   XARELTO 20 MG TABS tablet TAKE 1 TABLET BY MOUTH ONCE A DAY WTIH SUPPER    acetaminophen (TYLENOL) 325 MG tablet Take 2 tablets (650 mg total) by mouth every 6 (six) hours as needed for mild pain (or Fever >/= 101).    No facility-administered encounter medications on file as of 08/08/2020.     Objective:   Goals Addressed              This Visit's  Progress     Patient Stated     PharmD "I can't afford this medication" (pt-stated)        CARE PLAN ENTRY (see longtitudinal plan of care for additional care plan information)  Current Barriers:   Social, community, and financial barriers:  o Reports continued use of Xarelto patient assistance.  o Reports worries about foot/leg swelling today. Bilateral edema, worsened in the evening. No SOB, pain, erythema, some "tingling" on the bottom of her feet.   Polypharmacy; complex patient with multiple comorbidities including HTN, DVT/PE, PVC o APPROVED for Xarelto assistance through [employer] through 04/18/21. 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 Follows w/ Dr. Lucky Cowboy, next appt in November. - Reports having compression stockings at home o HTN: olmesartan 20 mg daily, HCTZ 12.5 mg daily, carvedilol 3.125 mg BID;  - Reports that she tries to avoid adding salt d/t her husband's HTN  Pharmacist Clinical Goal(s):   Over the next 90 days, patient will work with PharmD and provider towards optimized medication management  Interventions:  Comprehensive medication review performed; medication list updated in electronic medical record  Inter-disciplinary care team collaboration (see longitudinal plan of care)  Given hx DVT, consulted w/ provider in clinic Mable Paris, NP. Margaret examined the patient and recommended sooner f/u with PCP or other provider in office. PCP did not have f/u until right before patient is already scheduled for PCP  appt. Offered appt w/ Cindy Bills, NP. Patient declined. We recommended use of compression stockings, elevation, salt reduction in the meantime. Patient verbalized understanding.   Patient Self Care Activities:   Patient will take medications as prescribed  Patient will contact clinic with any worsened swelling, pain, erythema  Please see past  updates related to this goal by clicking on the "Past Updates" button in the selected goal          Plan:  - Will collaborate w/ PCP for any additional recommendations when he is back in office.  - Scheduled f/u call in ~ 8 weeks regarding medication management  Cindy Stein, PharmD, [place], [place] Pharmacist [place] [phone]

## 2020-08-08 NOTE — Patient Instructions (Signed)
Visit Information  Goals Addressed              This Visit's Progress     Patient Stated     PharmD "I can't afford this medication" (pt-stated)        CARE PLAN ENTRY (see longtitudinal plan of care for additional care plan information)  Current Barriers:   Social, community, and financial barriers:  o Reports continued use of Xarelto patient assistance.  o Reports worries about foot/leg swelling today. Bilateral edema, worsened in the evening. No SOB, pain, erythema, some "tingling" on the bottom of her feet.   Polypharmacy; complex patient with multiple comorbidities including HTN, DVT/PE, PVC o APPROVED for Xarelto assistance through [employer] through 04/18/21. 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 Follows w/ Dr. Lucky Cowboy, next appt in November. - Reports having compression stockings at home o HTN: olmesartan 20 mg daily, HCTZ 12.5 mg daily, carvedilol 3.125 mg BID;  - Reports that she tries to avoid adding salt d/t her husband's HTN  Pharmacist Clinical Goal(s):   Over the next 90 days, patient will work with PharmD and provider towards optimized medication management  Interventions:  Comprehensive medication review performed; medication list updated in electronic medical record  Inter-disciplinary care team collaboration (see longitudinal plan of care)  Given hx DVT, consulted w/ provider in clinic Mable Paris, NP. Margaret examined the patient and recommended sooner f/u with PCP or other provider in office. PCP did not have f/u until right before patient is already scheduled for PCP appt. Offered appt w/ Dawson Bills, NP. Patient declined. We recommended use of compression stockings, elevation, salt reduction in the meantime. Patient verbalized understanding.   Patient Self Care Activities:   Patient will take medications as prescribed  Patient will contact  clinic with any worsened swelling, pain, erythema  Please see past updates related to this goal by clicking on the "Past Updates" button in the selected goal         The patient verbalized understanding of instructions provided today and declined a print copy of patient instruction materials.    Plan:  - Will collaborate w/ PCP for any additional recommendations when he is back in office.  - Scheduled f/u call in ~ 8 weeks regarding medication management  Catie Darnelle Maffucci, PharmD, [place], [place] Pharmacist [place] [phone]

## 2020-08-09 NOTE — Progress Notes (Signed)
Catie asked to me to speak with patient as PCP was not in office.  Patient explained that she has been having BLE more notable at the end of the day, and prolonged standing. She denies salt indiscretion. She has compression stockings however hasnt worn them recently. Elevation improves swelling.   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 She has no pain in her calves with activity or walking. She has chronic SOB with exertion which she states is unchanged. Dr [name] has referred her to pulmonology whom she sees 08/18/20. No CP, rash.  Patient well appearing in exam room. She is was not labored in her speech.  Examined her extremities. No BLE edema, palpable cords or masses. No erythema or increased warmth. No asymmetry in calf size when compared bilaterally LE hair growth symmetric and present. varicosities noted. LE warm and palpable pedal pulses.  Advised her to start compression stockings, elevation and to look for sodium which is added to many foods. We offered her an appointment with Maudie Mercury for 08/10/20 and patient declined. She felt comfortable seeing her PCP in a couple of weeks. Advised to call the office with any new concerns or if compression stockings were not helpful. Patient agreed with plan.   Agree with plan of Catie, pharmD, as well. Mable Paris, NP

## 2020-08-18 ENCOUNTER — Encounter: Payer: Self-pay | Admitting: Internal Medicine

## 2020-08-18 ENCOUNTER — Ambulatory Visit (INDEPENDENT_AMBULATORY_CARE_PROVIDER_SITE_OTHER): Payer: Medicare Other | Admitting: Internal Medicine

## 2020-08-18 ENCOUNTER — Other Ambulatory Visit: Payer: Self-pay

## 2020-08-18 ENCOUNTER — Other Ambulatory Visit
Admission: RE | Admit: 2020-08-18 | Discharge: 2020-08-18 | Disposition: A | Discharge status: Home / Self Care | Payer: Medicare Other | Attending: Internal Medicine | Admitting: Internal Medicine

## 2020-08-18 VITALS — BP 128/74 | HR 70 | Temp 96.8°F | Ht 63.0 in | Wt 207.0 lb

## 2020-08-18 DIAGNOSIS — R0600 Dyspnea, unspecified: Secondary | ICD-10-CM

## 2020-08-18 DIAGNOSIS — Z86711 Personal history of pulmonary embolism: Secondary | ICD-10-CM

## 2020-08-18 DIAGNOSIS — Z7189 Other specified counseling: Secondary | ICD-10-CM | POA: Diagnosis not present

## 2020-08-18 DIAGNOSIS — R0609 Other forms of dyspnea: Secondary | ICD-10-CM

## 2020-08-18 DIAGNOSIS — Z7185 Encounter for immunization safety counseling: Secondary | ICD-10-CM

## 2020-08-18 LAB — CBC WITH DIFFERENTIAL/PLATELET
Abs Immature Granulocytes: 0.03 10*3/uL (ref 0.00–0.07)
Basophils Absolute: 0.1 10*3/uL (ref 0.0–0.1)
Basophils Relative: 1 %
Eosinophils Absolute: 0.1 10*3/uL (ref 0.0–0.5)
Eosinophils Relative: 1 %
HCT: 42 % (ref 36.0–46.0)
Hemoglobin: 14.7 g/dL (ref 12.0–15.0)
Immature Granulocytes: 0 %
Lymphocytes Relative: 28 %
Lymphs Abs: 2.3 10*3/uL (ref 0.7–4.0)
MCH: 30 pg (ref 26.0–34.0)
MCHC: 35 g/dL (ref 30.0–36.0)
MCV: 85.7 fL (ref 80.0–100.0)
Monocytes Absolute: 0.7 10*3/uL (ref 0.1–1.0)
Monocytes Relative: 8 %
Neutro Abs: 5.1 10*3/uL (ref 1.7–7.7)
Neutrophils Relative %: 62 %
Platelets: 224 10*3/uL (ref 150–400)
RBC: 4.9 MIL/uL (ref 3.87–5.11)
RDW: 13.6 % (ref 11.5–15.5)
WBC: 8.2 10*3/uL (ref 4.0–10.5)
nRBC: 0 % (ref 0.0–0.2)

## 2020-08-18 NOTE — Addendum Note (Signed)
Addended by: Santiago Bur on: 08/18/2020 11:03 AM   Modules accepted: Orders

## 2020-08-18 NOTE — Patient Instructions (Addendum)
ICD-10-CM   1. Dyspnea on exertion  R06.00   2. History of pulmonary embolus (PE)  Z86.711 NM Pulmonary Perf and Vent    DG Chest 2 View    Do VQ Scan with CXR  Do ONO test room air  Do RAST allergy panel, CBC with diff, blood IgE  Get covid vaccine   Follolwup - face to face of tele visit next 2-3 weeks to with app or Dr Chase Caller - [place]/ or [place] location   - if results are negative, plan for CPST bike test with EIB challenge

## 2020-08-18 NOTE — Progress Notes (Signed)
OV 08/18/2020  Subjective:  Patient ID: Cindy Stein, female , DOB: 04-07-1956 , age 64 y.o. , MRN: [id number] , ADDRESS: [facility] [id number]   08/18/2020 -   Chief Complaint  Patient presents with  . PULMONARY CONSULT    Per Dr. Gertie Exon 06/23/2020. c/o sob with exertion.     HPI Cindy Stein 64 y.o. -presents with her daughter.  She is a [employer].  She used to live in [place].  She relocated to [facility] area 4 years ago to be with the daughter.  She reports that for 8 years or so she has had insidious onset of shortness of breath that was stable/slightly progressive.  She said she had extensive cardiac work-up in [place] and this was noncontributory and nondiagnostic.  It appears then in December 2020 she had a right knee replacement.  In the aftermath of that in early 2021 she had left lower extremity swelling and was diagnosed with a DVT.  This is approximately February 2021.  Then she started having worsening shortness of breath.  This resulted in a diagnosis of pulmonary embolism on March 29, 2020 with CT angiogram.  Her RV LV ratio was 1.4.  On March 22, 2020 she underwent mechanical thrombectomy and thrombolysis pulmonary arteries.  She is status post selective catheter placement right middle lobe and lower lobe pulmonary arteries.  After that her dyspnea improved but she is not back to baseline.  She still has significant amount of dyspnea on exertion there is no orthopnea.  No proximal nocturnal dyspnea.  No wheezing.  No spring allergies.  Daughter think she snores.  No excessive daytime somnolence.  Nevertheless she has significant dyspnea just walking room to room  There is associated choking of the throat but no wheezing.  She says cardiac work-up was negative.  She had follow-up CT scan of the chest without contrast because of pulmonary nodules there is no ILD or emphysema this was in August 2021.  She is here for f  consultation because of the dyspnea.  Walking desaturation test does not show any desaturation.  In fact she got tachycardic.  She also got quite dyspneic.  She has not gotten her Covid vaccine because of fear of side effects and also because of her hospitalization other medical issues.  Simple office walk 185 feet x  3 laps goal with forehead probe 08/18/2020   O2 used ra  Number laps completed 3  Comments about pace moderate  Resting Pulse Ox/HR 100% and 79/min  Final Pulse Ox/HR 100% and 94/min  Desaturated </= 88% no  Desaturated <= 3% points no  Got Tachycardic >/= 90/min yes  Symptoms at end of test Moderate dyspnea  Miscellaneous comments x      ROS - per HPI     has a past medical history of Arthritis of left ankle, Arthritis of left knee, Hypertension, Plantar fascial fibromatosis, Pulmonary embolus ([place]) (03/21/2020), PVC (premature ventricular contraction), Unspecified osteoarthritis, unspecified site, and Vitamin D deficiency.   reports that she has never smoked. She has never used smokeless tobacco.  Past Surgical History:  Procedure Laterality Date  . CARDIAC CATHETERIZATION  2006  . carpel tunnel    . GALLBLADDER SURGERY    . LEFT HEART CATH AND CORONARY ANGIOGRAPHY Left 06/14/2019   Procedure: LEFT HEART CATH AND CORONARY ANGIOGRAPHY;  Surgeon: Wellington Hampshire, MD;  Location: [facility];  Service: Cardiovascular;  Laterality: Left;  . PULMONARY  THROMBECTOMY Bilateral 03/22/2020   Procedure: PULMONARY THROMBECTOMY / THROMBOLYSIS;  Surgeon: Algernon Huxley, MD;  Location: [facility];  Service: Cardiovascular;  Laterality: Bilateral;    No Known Allergies   There is no immunization history on file for this patient.  Family History  Problem Relation Age of Onset  . Asthma Mother   . Asthma Father   . Heart failure Father   . Leukemia Sister   . Breast cancer Neg Hx      Current Outpatient Medications:  .  acetaminophen (TYLENOL) 325  MG tablet, Take 2 tablets (650 mg total) by mouth every 6 (six) hours as needed for mild pain (or Fever >/= 101)., Disp:  , Rfl:  .  carvedilol (COREG) 3.125 MG tablet, Take 3.125 mg by mouth 2 (two) times daily with a meal., Disp: , Rfl:  .  hydrochlorothiazide (HYDRODIURIL) 12.5 MG tablet, Take 1 tablet (12.5 mg total) by mouth daily., Disp: 90 tablet, Rfl: 1 .  olmesartan (BENICAR) 20 MG tablet, Take 1 tablet (20 mg total) by mouth daily., Disp: 90 tablet, Rfl: 1 .  XARELTO 20 MG TABS tablet, TAKE 1 TABLET BY MOUTH ONCE A DAY WTIH SUPPER, Disp: 30 tablet, Rfl: 2      Objective:   Vitals:   08/18/20 0956  BP: 128/74  Pulse: 70  Temp: (!) 96.8 F (36 C)  TempSrc: Temporal  SpO2: 99%  Weight: 207 lb (93.9 kg)  Height: 5\' 3"  (1.6 m)    Estimated body mass index is 36.67 kg/m as calculated from the following:   Height as of this encounter: 5\' 3"  (1.6 m).   Weight as of this encounter: 207 lb (93.9 kg).  @WEIGHTCHANGE @  [employer]   08/18/20 0956  Weight: 207 lb (93.9 kg)     Physical Exam  General Appearance:    Alert, cooperative, no distress, appears stated age - ye , Deconditioned looking - no , OBESE  - yes, Sitting on Wheelchair -  no  Head:    Normocephalic, without obvious abnormality, atraumatic  Eyes:    PERRL, conjunctiva/corneas clear,  Ears:    Normal TM's and external ear canals, both ears  Nose:   Nares normal, septum midline, mucosa normal, no drainage    or sinus tenderness. OXYGEN ON  - no . Patient is @ ra   Throat:   Lips, mucosa, and tongue normal; teeth and gums normal. Cyanosis on lips - no  Neck:   Supple, symmetrical, trachea midline, no adenopathy;    thyroid:  no enlargement/tenderness/nodules; no carotid   bruit or JVD  Back:     Symmetric, no curvature, ROM normal, no CVA tenderness  Lungs:     Distress - no , Wheeze no, Barrell Chest - no, Purse lip breathing - no, Crackles - no   Chest Wall:    No tenderness or deformity.    Heart:     Regular rate and rhythm, S1 and S2 normal, no rub   or gallop, Murmur - no  Breast Exam:    NOT DONE  Abdomen:     Soft, non-tender, bowel sounds active all four quadrants,    no masses, no organomegaly. Visceral obesity - yes  Genitalia:   NOT DONE  Rectal:   NOT DONE  Extremities:   Extremities - normal, Has Cane - no, Clubbing - no, Edema - no  Pulses:   2+ and symmetric all extremities  Skin:   Stigmata of Connective Tissue  Disease - no  Lymph nodes:   Cervical, supraclavicular, and axillary nodes normal  Psychiatric:  Neurologic:   Pleasant - yes, Anxious - no, Flat affect - no  CAm-ICU - neg, Alert and Oriented x 3 - yes, Moves all 4s - yes, Speech - normal, Cognition - intact           Assessment:       ICD-10-CM   1. Dyspnea on exertion  R06.00 CBC w/Diff    IgE    Allergen Panel (27) + IGE  2. History of pulmonary embolus (PE)  Z86.711 NM Pulmonary Perf and Vent    DG Chest 2 View    Pulse oximetry, overnight  3. Vaccine counseling  Z71.89    Differential diagnosis here is likely physical deconditioning, diastolic dysfunction, vocal cord dysfunction but we need to rule out chronic thromboembolic pulmonary hypertension.  Obesity is also probably playing a role.  Asthma is unlikely but not ruled out fully.    Plan:     Patient Instructions     ICD-10-CM   1. Dyspnea on exertion  R06.00   2. History of pulmonary embolus (PE)  Z86.711 NM Pulmonary Perf and Vent    DG Chest 2 View    Do VQ Scan with CXR  Do ONO test room air  Do RAST allergy panel, CBC with diff, blood IgE  Get covid vaccine   Follolwup - face to face of tele visit next 2-3 weeks to with app or Dr Chase Caller - [place]/ or [place] location   - if results are negative, plan for CPST bike test with EIB challenge       SIGNATURE    Dr. Brand Males, M.D., F.C.C.P,  Pulmonary and Critical Care Medicine Staff Physician, [place] Director - Interstitial Lung Disease   Program  P[facility] [place] [facility] [place], [place], [address]  Pager: [phone], If no answer or between  15:00h - 7:00h: call [phone] Telephone: [phone]  10:37 AM 08/18/2020

## 2020-08-20 LAB — ALLERGEN PANEL (27) + IGE
Alternaria Alternata IgE: <0.1 kU/L
Aspergillus Fumigatus IgE: <0.1 kU/L
Bahia Grass IgE: <0.1 kU/L
Bermuda Grass IgE: <0.1 kU/L
Cat Dander IgE: <0.1 kU/L
Cedar, Mountain IgE: <0.1 kU/L
Cladosporium Herbarum IgE: <0.1 kU/L
Cocklebur IgE: <0.1 kU/L
Cockroach, American IgE: <0.1 kU/L
Common Silver Birch IgE: <0.1 kU/L
D Farinae IgE: <0.1 kU/L
D Pteronyssinus IgE: <0.1 kU/L
Dog Dander IgE: <0.1 kU/L
Elm, American IgE: <0.1 kU/L
Hickory, White IgE: <0.1 kU/L
IgE (Immunoglobulin E), Serum: 234 IU/mL (ref 6–495)
Johnson Grass IgE: <0.1 kU/L
Kentucky Bluegrass IgE: <0.1 kU/L
Maple/Box Elder IgE: <0.1 kU/L
Mucor Racemosus IgE: <0.1 kU/L
Oak, White IgE: <0.1 kU/L
Penicillium Chrysogen IgE: <0.1 kU/L
Pigweed, Rough IgE: <0.1 kU/L
Plantain, English IgE: <0.1 kU/L
Ragweed, Short IgE: <0.1 kU/L
Setomelanomma Rostrat: <0.1 kU/L
Timothy Grass IgE: <0.1 kU/L
White Mulberry IgE: <0.1 kU/L

## 2020-08-21 NOTE — Progress Notes (Signed)
Blood allergy panel - negaative/Normal. Normal results will NOT be called in

## 2020-08-29 ENCOUNTER — Other Ambulatory Visit: Payer: Self-pay

## 2020-08-29 ENCOUNTER — Ambulatory Visit (INDEPENDENT_AMBULATORY_CARE_PROVIDER_SITE_OTHER): Payer: Medicare Other | Admitting: Family Medicine

## 2020-08-29 ENCOUNTER — Encounter: Payer: Self-pay | Admitting: Family Medicine

## 2020-08-29 DIAGNOSIS — R0600 Dyspnea, unspecified: Secondary | ICD-10-CM

## 2020-08-29 DIAGNOSIS — R0609 Other forms of dyspnea: Secondary | ICD-10-CM | POA: Insufficient documentation

## 2020-08-29 DIAGNOSIS — Z6836 Body mass index (BMI) 36.0-36.9, adult: Secondary | ICD-10-CM

## 2020-08-29 DIAGNOSIS — I2699 Other pulmonary embolism without acute cor pulmonale: Secondary | ICD-10-CM

## 2020-08-29 DIAGNOSIS — E669 Obesity, unspecified: Secondary | ICD-10-CM | POA: Insufficient documentation

## 2020-08-29 NOTE — Assessment & Plan Note (Signed)
This is a chronic ongoing issue.  She will complete her work-up through pulmonology.  Discussed that deconditioning and her weight may be playing a role and that we could consider medication for weight loss in the future if needed.  She will remain active as she has been.

## 2020-08-29 NOTE — Assessment & Plan Note (Signed)
Patient will continue with healthy diet and try to remain as active as she is able to.

## 2020-08-29 NOTE — Patient Instructions (Signed)
Nice to see you. Please finish your work-up through Dr. Chase Caller. Please continue your Xarelto 20 mg once daily.

## 2020-08-29 NOTE — Assessment & Plan Note (Signed)
Patient continues on Xarelto 20 mg once daily for treatment.

## 2020-08-29 NOTE — Progress Notes (Signed)
[name], MD Phone: [phone]  Cindy Stein is a 64 y.o. female who presents today for f/u.  DOE/history or PE: Patient notes her symptoms are relatively stable.  Some days her breathing does not bother her though other days seem worse.  She saw pulmonology and they ordered lab work which was unremarkable.  They also ordered a VQ scan which she is scheduled for tomorrow.  They ordered an overnight oxygen test as well which she is turning in today.  She has had cardiac evaluation which was negative.  She continues on Xarelto and has had no bleeding issues.  She notes no coughing or wheezing.  No smoking history.  She walks 25 minutes at a time when she does go to walk for exercise.  She eats lots of fruits and vegetables.  She is very active around the house.  She gets short of breath when moving from room to room.  Social History   Tobacco Use  Smoking Status Never Smoker  Smokeless Tobacco Never Used     ROS see history of present illness  Objective  Physical Exam Vitals:   08/29/20 1006  BP: 120/70  Pulse: 66  Temp: 98.3 F (36.8 C)  SpO2: 97%    BP Readings from Last 3 Encounters:  08/29/20 120/70  08/18/20 128/74  06/27/20 (!) 135/80   Wt Readings from Last 3 Encounters:  08/29/20 208 lb 9.6 oz (94.6 kg)  08/18/20 207 lb (93.9 kg)  06/27/20 (!) 203 lb (92.1 kg)    Physical Exam Constitutional:      General: She is not in acute distress.    Appearance: She is not diaphoretic.  Cardiovascular:     Rate and Rhythm: Normal rate and regular rhythm.     Heart sounds: Normal heart sounds.  Pulmonary:     Effort: Pulmonary effort is normal.     Breath sounds: Normal breath sounds.  Musculoskeletal:     Right lower leg: No edema.     Left lower leg: No edema.     Comments: No calf tenderness or cords  Skin:    General: Skin is warm and dry.  Neurological:     Mental Status: She is alert.      Assessment/Plan: Please see individual problem  list.  Pulmonary embolus [facility]) Patient continues on Xarelto 20 mg once daily for treatment.  Dyspnea on exertion This is a chronic ongoing issue.  She will complete her work-up through pulmonology.  Discussed that deconditioning and her weight may be playing a role and that we could consider medication for weight loss in the future if needed.  She will remain active as she has been.  Obesity Patient will continue with healthy diet and try to remain as active as she is able to.   No orders of the defined types were placed in this encounter.   No orders of the defined types were placed in this encounter.   Cindy Stein was seen today for follow-up.  Diagnoses and all orders for this visit:  Pulmonary embolism, unspecified chronicity, unspecified pulmonary embolism type, unspecified whether acute cor pulmonale present (HCC)  Dyspnea on exertion  Class 2 severe obesity due to excess calories with serious comorbidity and body mass index (BMI) of 36.0 to 36.9 in adult [facility])     This visit occurred during the SARS-CoV-2 public health emergency.  Safety protocols were in place, including screening questions prior to the visit, additional usage of staff PPE, and extensive cleaning of exam room  while observing appropriate contact time as indicated for disinfecting solutions.    [name], MD [place]

## 2020-08-30 ENCOUNTER — Ambulatory Visit
Admission: RE | Admit: 2020-08-30 | Discharge: 2020-08-30 | Disposition: A | Discharge status: Home / Self Care | Payer: Medicare Other | Attending: Internal Medicine | Admitting: Internal Medicine

## 2020-08-30 ENCOUNTER — Encounter
Admission: RE | Admit: 2020-08-30 | Discharge: 2020-08-30 | Disposition: A | Discharge status: Home / Self Care | Payer: Medicare Other | Source: Ambulatory Visit | Attending: Internal Medicine | Admitting: Internal Medicine

## 2020-08-30 ENCOUNTER — Ambulatory Visit
Admission: RE | Admit: 2020-08-30 | Discharge: 2020-08-30 | Disposition: A | Discharge status: Home / Self Care | Payer: Medicare Other | Source: Ambulatory Visit | Attending: Internal Medicine | Admitting: Internal Medicine

## 2020-08-30 ENCOUNTER — Other Ambulatory Visit: Payer: Self-pay

## 2020-08-30 DIAGNOSIS — Z86711 Personal history of pulmonary embolism: Secondary | ICD-10-CM

## 2020-08-30 MED ORDER — TECHNETIUM TC 99M MEDRONATE IV KIT: 20.0000 | PACK | Freq: Once | INTRAVENOUS | Status: DC | PRN

## 2020-08-30 MED ORDER — TECHNETIUM TO 99M ALBUMIN AGGREGATED
4.0000 | Freq: Once | INTRAVENOUS | Status: AC | PRN
  Administered 2020-08-30: 4.09 via INTRAVENOUS

## 2020-09-01 ENCOUNTER — Ambulatory Visit: Payer: Medicare Other | Admitting: Adult Health

## 2020-09-01 ENCOUNTER — Encounter: Payer: Self-pay | Admitting: Adult Health

## 2020-09-01 ENCOUNTER — Other Ambulatory Visit: Payer: Self-pay

## 2020-09-01 ENCOUNTER — Telehealth: Payer: Self-pay | Admitting: Adult Health

## 2020-09-01 ENCOUNTER — Ambulatory Visit (INDEPENDENT_AMBULATORY_CARE_PROVIDER_SITE_OTHER): Payer: Medicare Other | Admitting: Adult Health

## 2020-09-01 DIAGNOSIS — R0600 Dyspnea, unspecified: Secondary | ICD-10-CM | POA: Diagnosis not present

## 2020-09-01 DIAGNOSIS — Z86711 Personal history of pulmonary embolism: Secondary | ICD-10-CM

## 2020-09-01 DIAGNOSIS — R0609 Other forms of dyspnea: Secondary | ICD-10-CM

## 2020-09-01 MED ORDER — ALBUTEROL SULFATE HFA 108 (90 BASE) MCG/ACT IN AERS: 2.0000 | INHALATION_SPRAY | Freq: Four times a day (QID) | RESPIRATORY_TRACT | 2 refills | Status: DC | PRN

## 2020-09-01 NOTE — Telephone Encounter (Signed)
ONO: Has minimal desats-would not recommend O2 Did have a few periodic drops in oxygen-can discuss on return ov.

## 2020-09-01 NOTE — Patient Instructions (Signed)
Activity as tolerated May try albuterol inhaler 1 to 2 puffs every 6 hours as needed Set up for CPST Bike with EIB .  Follow-up in 6 to 8 weeks with Dr. Chase Caller  at [facility] office and As needed   Please contact office for sooner follow up if symptoms do not improve or worsen or seek emergency care

## 2020-09-01 NOTE — Progress Notes (Signed)
Virtual Visit via Video Note  I connected with Cindy Stein on 09/01/20 at 11:00 AM EDT by a video enabled telemedicine application and verified that I am speaking with the correct person using two identifiers.  Location: Patient: Home  Provider: Office    I discussed the limitations of evaluation and management by telemedicine and the availability of in person appointments. The patient expressed understanding and agreed to proceed.  History of Present Illness: Today's televisit is a 1 month follow-up.  64 year old female seen for pulmonary consult August 18, 2020 for chronic shortness of breath. Patient complained over the last 8 to 10 years she has had insidious onset of shortness of breath with activity.  She had an extensive work-up while living in [place] and reports that the cardiac work-up was nondiagnostic/reported as normal.  She was moved to the area in 2017 to be closer to family.  In December 2020 she had a right knee replacement.  She developed in early 2021 a left lower extremity DVT.  And then shortly after this she began having worsening shortness of breath and was found to have PE on March 29, 2020 on CT.  She did undergo a mechanical thrombectomy and thrombolysis .  She did notice that her breathing did improve after this and she is currently on Xarelto.  However she says she continues to have a chronic shortness of breath that she had prior to her blood clot.  She gets short of breath with mild to moderate activities.  Has decreased activity tolerance.  She does have some throat clearing.  Minimum cough and no wheezing. Patient was set up for a VQ scan that was done on August 30, 2020 that showed normal perfusion lung scan with no perfusion defects. Lab work showed negative allergy panel.  IgE was 234.  CBC showed normal eos. We reviewed her test results.  Overnight oximetry test showed very minimal desaturations with 4 minutes noted less than 88%.  Over 8 hours  greater than 90% on room air.  Patient did have some intermittent desaturations I discussed any possible underlying sleep apnea such as daytime sleepiness snoring patient denies any of the symptoms and did not want to pursue further evaluation for possible sleep apnea    Observations/Objective:  Speaks in full sentences with no audible distress or wheezing  Assessment and Plan: Chronic dyspnea x8 to 10 years questionable etiology.  Patient did have a PE noted in April 2021.  (Most likely provoked with recent DVT after knee replacement.). Reports abnormal cardiac work-up.  There are small pulmonary nodules largest measuring 3 to 4 mm most likely consistent with a benign etiology.  She is a never smoker.  She does have a serial CT chest scheduled. Allergy work-up is unrevealing.  No signs of anemia or elevated eosinophils on CBC. We will check a cardiopulmonary stress test with exercise-induced bronchospasm May have a component of deconditioning and/or exercise-induced asthma Trial of albuterol inhaler today.  PE -continue on Xarelto   Plan  Patient Instructions  Activity as tolerated May try albuterol inhaler 1 to 2 puffs every 6 hours as needed Set up for CPST Bike with EIB .  Follow-up in 6 to 8 weeks with Dr. Chase Caller  at [facility] office and As needed   Please contact office for sooner follow up if symptoms do not improve or worsen or seek emergency care       Follow Up Instructions: Follow-up in 6 to 8 weeks with Dr. Chase Caller in [place] .  Please contact office for sooner follow up if symptoms do not improve or worsen or seek emergency care     I discussed the assessment and treatment plan with the patient. The patient was provided an opportunity to ask questions and all were answered. The patient agreed with the plan and demonstrated an understanding of the instructions.   The patient was advised to call back or seek an in-person evaluation if the symptoms worsen or if  the condition fails to improve as anticipated.  I provided 22  minutes of non-face-to-face time during this encounter.   Rexene Edison, NP

## 2020-09-01 NOTE — Addendum Note (Signed)
Addended by: Vanessa Barbara on: 09/01/2020 04:32 PM   Modules accepted: Orders

## 2020-09-04 ENCOUNTER — Telehealth: Payer: Self-pay | Admitting: Adult Health

## 2020-09-04 ENCOUNTER — Ambulatory Visit: Payer: Medicare Other | Admitting: Internal Medicine

## 2020-09-04 ENCOUNTER — Ambulatory Visit (INDEPENDENT_AMBULATORY_CARE_PROVIDER_SITE_OTHER): Payer: Medicare Other

## 2020-09-04 ENCOUNTER — Other Ambulatory Visit: Payer: Self-pay

## 2020-09-04 DIAGNOSIS — Z23 Encounter for immunization: Secondary | ICD-10-CM

## 2020-09-04 NOTE — Telephone Encounter (Signed)
Spoke to patient's daughter, Johny Drilling). Sunday Spillers is requesting update from patients last OV. I have reviewed patients AVS instructions with Sunday Spillers. Patient has not been contacted to schedule CPST. Sunday Spillers is requesting that she be contacted to do so. Sylvia's contact number is [phone].  Holly, please advise. Thanks

## 2020-09-05 NOTE — Telephone Encounter (Signed)
It looks like this was being worked by [employer].

## 2020-09-05 NOTE — Telephone Encounter (Signed)
I called Specialty Scheduling at [facility] yesterday to get this scheduled and I spoke to [place].  She had questions about order & stated she was going to call Annia Belt over cardiopulmonary dept and would call me back.  Pamala Hurry called this morning & said Lynelle Smoke is out sick and she will hopefully be able to check with her tomorrow.  I just tried to call pt's dtr Sunday Spillers to make her aware and had to leave a vm for her to call me back.

## 2020-09-06 ENCOUNTER — Ambulatory Visit: Payer: Medicare Other

## 2020-09-07 ENCOUNTER — Telehealth: Payer: Medicare Other

## 2020-09-07 ENCOUNTER — Telehealth: Payer: Self-pay | Admitting: Pharmacist

## 2020-09-07 NOTE — Telephone Encounter (Signed)
I received vm for Cindy Stein at [facility] and she found out they do not schedule this test there.  Pt will need to go to [facility].  I called pt & left her a vm letting her know someone for [facility] would be calling her to schedule CPX & gave her their phone #.  I called dtr to make her aware & she states bad telephone reception & she would call me back.

## 2020-09-07 NOTE — Progress Notes (Signed)
°  Chronic Care Management   Note  09/07/2020 Name: Cindy Stein MRN: [id number] DOB: November 28, 1956   Attempted to contact patient for scheduled appointment for medication management support. Left HIPAA compliant message for patient to return my call at their convenience.    Plan: - If I do not hear back from the patient by end of business today, will collaborate with Care Guide to outreach to schedule follow up with me  Catie Darnelle Maffucci, PharmD, [place], [place] Pharmacist [place] [phone]

## 2020-09-08 NOTE — Telephone Encounter (Signed)
I checked appt desk and pt has been scheduled for CPX at [facility].  That would have spoken to pt to schedule.  Nothing further needed on this note.

## 2020-09-08 NOTE — Telephone Encounter (Signed)
I have collaborated with the care management provider regarding care management and care coordination activities outlined in this encounter and have reviewed this encounter including documentation in the note and care plan. I am certifying that I agree with the content of this note and encounter as supervising physician.  Laquentin Loudermilk, MD  

## 2020-09-13 NOTE — Telephone Encounter (Signed)
Pt has been r/s  

## 2020-09-14 ENCOUNTER — Telehealth: Payer: Self-pay | Admitting: Adult Health

## 2020-09-14 NOTE — Telephone Encounter (Signed)
Lm for patient.  

## 2020-09-14 NOTE — Telephone Encounter (Signed)
Spoke to patient, who stated that she would like to cancel CPST as her breathing has improved.   Routing to TP as a FYI.   Cindy Stein, can you cancel CPST?

## 2020-09-15 NOTE — Telephone Encounter (Signed)
LMTCB for pt 

## 2020-09-15 NOTE — Telephone Encounter (Signed)
CPX was CXL today by Adolm Joseph

## 2020-09-15 NOTE — Telephone Encounter (Signed)
That is fine , please tell her to make follow up in 3 months with Dr. Chase Caller for f/up  Call back if she changes her mind  Please contact office for sooner follow up if symptoms do not improve or worsen or seek emergency care

## 2020-09-19 ENCOUNTER — Encounter: Payer: Self-pay | Admitting: Emergency Medicine

## 2020-09-19 NOTE — Telephone Encounter (Signed)
Letter has been printed and placed in the outgoing mail. Nothing further was needed.

## 2020-09-19 NOTE — Telephone Encounter (Signed)
ATC pt. Unable to connect due to blocked number. Will send letter to pt.    Triage, can you print and mail letter to pts home. Letter already generated. Please close encounter once complete. Thanks.

## 2020-10-03 ENCOUNTER — Encounter (HOSPITAL_COMMUNITY): Payer: Medicare Other

## 2020-10-09 ENCOUNTER — Other Ambulatory Visit: Payer: Self-pay | Admitting: Orthopedic Surgery

## 2020-10-09 DIAGNOSIS — M25562 Pain in left knee: Secondary | ICD-10-CM

## 2020-10-10 ENCOUNTER — Ambulatory Visit (INDEPENDENT_AMBULATORY_CARE_PROVIDER_SITE_OTHER): Payer: Medicare Other | Admitting: Vascular Surgery

## 2020-10-13 ENCOUNTER — Telehealth: Payer: Medicare Other

## 2020-10-20 ENCOUNTER — Ambulatory Visit
Admission: RE | Admit: 2020-10-20 | Discharge: 2020-10-20 | Disposition: A | Discharge status: Home / Self Care | Payer: Medicare Other | Source: Ambulatory Visit | Attending: Orthopedic Surgery | Admitting: Orthopedic Surgery

## 2020-10-20 ENCOUNTER — Other Ambulatory Visit: Payer: Self-pay

## 2020-10-20 DIAGNOSIS — M25562 Pain in left knee: Secondary | ICD-10-CM | POA: Insufficient documentation

## 2020-10-23 ENCOUNTER — Telehealth: Payer: Self-pay | Admitting: Cardiovascular Disease

## 2020-10-23 ENCOUNTER — Telehealth: Payer: Self-pay

## 2020-10-23 NOTE — Telephone Encounter (Signed)
   [facility] Pre-operative Risk Assessment    HEARTCARE STAFF: - Please ensure there is not already an duplicate clearance open for this procedure. - Under Visit Info/Reason for Call, type in Other and utilize the format Clearance MM/DD/YY or Clearance TBD. Do not use dashes or single digits. - If request is for dental extraction, please clarify the # of teeth to be extracted.  Request for surgical clearance:  1. What type of surgery is being performed? L total Knee Arthroplasty    2. When is this surgery scheduled? 12-15-19   3. What type of clearance is required (medical clearance vs. Pharmacy clearance to hold med vs. Both)? Medical   4. Are there any medications that need to be held prior to surgery and how long? Not noted   5. Practice name and name of physician performing surgery? [facility] Dr. Harlow Mares    6. What is the office phone number?  [phone]  X [phone]    7.   What is the office fax number?  [phone]   8.   Anesthesia type (None, local, MAC, general) ? Spinal Local Block    Clarisse Gouge 10/23/2020, 2:09 PM  _________________________________________________________________   (provider comments below)

## 2020-10-23 NOTE — Telephone Encounter (Signed)
   Primary Cardiologist: [name], MD  Chart reviewed as part of pre-operative protocol coverage. Patient has not been seen in over 1 year (07/2019). Therefore, she will require a follow-up visit in order to better assess preoperative cardiovascular risk.  Pre-op covering staff: - Please schedule appointment and call patient to inform them. If patient already had an upcoming appointment within acceptable timeframe, please add "pre-op clearance" to the appointment notes so provider is aware. - Please contact requesting surgeon's office via preferred method (i.e, phone, fax) to inform them of need for appointment prior to surgery.  Of note, she was diagnosed with DVT and bilateral PE in 03/2020 and is now on Xarelto. Given she is not on anticoagulation for a cardiac reason, would defer recommendations for holding Xarelto to PCP.  Will remove from pre-op pool.  Darreld Mclean, PA-C  10/23/2020, 2:19 PM

## 2020-10-24 ENCOUNTER — Other Ambulatory Visit: Payer: Self-pay

## 2020-10-24 ENCOUNTER — Encounter (INDEPENDENT_AMBULATORY_CARE_PROVIDER_SITE_OTHER): Payer: Self-pay | Admitting: Vascular Surgery

## 2020-10-24 ENCOUNTER — Telehealth (INDEPENDENT_AMBULATORY_CARE_PROVIDER_SITE_OTHER): Payer: Self-pay

## 2020-10-24 ENCOUNTER — Ambulatory Visit (INDEPENDENT_AMBULATORY_CARE_PROVIDER_SITE_OTHER): Payer: Medicare Other | Admitting: Vascular Surgery

## 2020-10-24 VITALS — BP 114/68 | HR 61 | Ht 63.0 in | Wt 211.0 lb

## 2020-10-24 DIAGNOSIS — I82502 Chronic embolism and thrombosis of unspecified deep veins of left lower extremity: Secondary | ICD-10-CM | POA: Diagnosis not present

## 2020-10-24 DIAGNOSIS — I10 Essential (primary) hypertension: Secondary | ICD-10-CM | POA: Diagnosis not present

## 2020-10-24 DIAGNOSIS — M1712 Unilateral primary osteoarthritis, left knee: Secondary | ICD-10-CM | POA: Diagnosis not present

## 2020-10-24 DIAGNOSIS — I2699 Other pulmonary embolism without acute cor pulmonale: Secondary | ICD-10-CM

## 2020-10-24 DIAGNOSIS — Z9889 Other specified postprocedural states: Secondary | ICD-10-CM | POA: Insufficient documentation

## 2020-10-24 DIAGNOSIS — Z9049 Acquired absence of other specified parts of digestive tract: Secondary | ICD-10-CM | POA: Insufficient documentation

## 2020-10-24 DIAGNOSIS — Z96659 Presence of unspecified artificial knee joint: Secondary | ICD-10-CM | POA: Insufficient documentation

## 2020-10-24 NOTE — Telephone Encounter (Signed)
She is already on the schedule in December. Please hold on to her clearance paperwork and we can do a clearance visit in December as scheduled.

## 2020-10-24 NOTE — Progress Notes (Signed)
MRN : [id number]  Cindy Stein is a 64 y.o. (1956-09-17) female who presents with chief complaint of  Chief Complaint  Patient presents with  . Follow-up    6 mo no studies  .  History of Present Illness: Patient returns today in follow up of her DVT and pulmonary embolus.  She developed this after a right knee replacement about a year ago.  We did pulmonary thrombectomy on her last spring for submassive pulmonary embolus.  It still took several weeks to months before her breathing returned to normal, but that markedly improved her breathing and she now breathes normally with minimal shortness of breath at this time.  Her leg swelling is also markedly improved.  She continues on Xarelto and is doing well with this. She does have an upcoming left knee replacement scheduled for December 14, 2020.  Her last pulmonary embolus was after her right knee replacement last year.  She is very concerned about this.  Current Outpatient Medications  Medication Sig Dispense Refill  . acetaminophen (TYLENOL) 325 MG tablet Take 2 tablets (650 mg total) by mouth every 6 (six) hours as needed for mild pain (or Fever >/= 101).    Marland Kitchen albuterol (VENTOLIN HFA) 108 (90 Base) MCG/ACT inhaler Inhale 2 puffs into the lungs every 6 (six) hours as needed for wheezing or shortness of breath. 8 g 2  . carvedilol (COREG) 3.125 MG tablet Take 3.125 mg by mouth 2 (two) times daily with a meal.    . hydrochlorothiazide (HYDRODIURIL) 12.5 MG tablet Take 1 tablet (12.5 mg total) by mouth daily. 90 tablet 1  . olmesartan (BENICAR) 20 MG tablet Take 1 tablet (20 mg total) by mouth daily. 90 tablet 1  . oxyCODONE-acetaminophen (PERCOCET/ROXICET) 5-325 MG tablet Take by mouth every 4 (four) hours as needed for severe pain.    Marland Kitchen XARELTO 20 MG TABS tablet TAKE 1 TABLET BY MOUTH ONCE A DAY WTIH SUPPER 30 tablet 2   No current facility-administered medications for this visit.    Past Medical History:  Diagnosis Date  . Arthritis  of left ankle   . Arthritis of left knee   . Hypertension   . Plantar fascial fibromatosis   . Pulmonary embolus ([place]) 03/21/2020  . PVC (premature ventricular contraction)   . Unspecified osteoarthritis, unspecified site   . Vitamin D deficiency     Past Surgical History:  Procedure Laterality Date  . CARDIAC CATHETERIZATION  2006  . carpel tunnel    . GALLBLADDER SURGERY    . LEFT HEART CATH AND CORONARY ANGIOGRAPHY Left 06/14/2019   Procedure: LEFT HEART CATH AND CORONARY ANGIOGRAPHY;  Surgeon: Wellington Hampshire, MD;  Location: [facility];  Service: Cardiovascular;  Laterality: Left;  . PULMONARY THROMBECTOMY Bilateral 03/22/2020   Procedure: PULMONARY THROMBECTOMY / THROMBOLYSIS;  Surgeon: Algernon Huxley, MD;  Location: [place] [facility];  Service: Cardiovascular;  Laterality: Bilateral;     Social History   Tobacco Use  . Smoking status: Never Smoker  . Smokeless tobacco: Never Used  Vaping Use  . Vaping Use: Never used  Substance Use Topics  . Alcohol use: Yes    Comment: occass  . Drug use: Never      Family History  Problem Relation Age of Onset  . Asthma Mother   . Asthma Father   . Heart failure Father   . Leukemia Sister   . Breast cancer Neg Hx      No Known Allergies  REVIEW OF SYSTEMS (Negative unless checked)  Constitutional: [] Weight loss  [] Fever  [] Chills Cardiac: [] Chest pain   [] Chest pressure   [] Palpitations   [] Shortness of breath when laying flat   [] Shortness of breath at rest   [] Shortness of breath with exertion. Vascular:  [] Pain in legs with walking   [] Pain in legs at rest   [] Pain in legs when laying flat   [] Claudication   [] Pain in feet when walking  [] Pain in feet at rest  [] Pain in feet when laying flat   [x] History of DVT   [x] Phlebitis   [x] Swelling in legs   [] Varicose veins   [] Non-healing ulcers Pulmonary:   [] Uses home oxygen   [] Productive cough   [] Hemoptysis   [] Wheeze  [] COPD   [] Asthma Neurologic:   [] Dizziness  [] Blackouts   [] Seizures   [] History of stroke   [] History of TIA  [] Aphasia   [] Temporary blindness   [] Dysphagia   [] Weakness or numbness in arms   [] Weakness or numbness in legs Musculoskeletal:  [x] Arthritis   [] Joint swelling   [x] Joint pain   [] Low back pain Hematologic:  [] Easy bruising  [] Easy bleeding   [] Hypercoagulable state   [] Anemic   Gastrointestinal:  [] Blood in stool   [] Vomiting blood  [] Gastroesophageal reflux/heartburn   [] Abdominal pain Genitourinary:  [] Chronic kidney disease   [] Difficult urination  [] Frequent urination  [] Burning with urination   [] Hematuria Skin:  [] Rashes   [] Ulcers   [] Wounds Psychological:  [] History of anxiety   []  History of major depression.  Physical Examination  BP 114/68   Pulse 61   Ht 5\' 3"  (1.6 m)   Wt 211 lb (95.7 kg)   BMI 37.38 kg/m  Gen:  WD/WN, NAD Head: [place]/AT, No temporalis wasting. Ear/Nose/Throat: Hearing grossly intact, nares w/o erythema or drainage Eyes: Conjunctiva clear. Sclera non-icteric Neck: Supple.  Trachea midline Pulmonary:  Good air movement, no use of accessory muscles.  Cardiac: RRR, no JVD Vascular: Vessel Right Left  Radial Palpable Palpable                          PT Palpable Palpable  DP Palpable Palpable   Gastrointestinal: soft, non-tender/non-distended. No guarding/reflex.  Musculoskeletal: M/S 5/5 throughout.  No deformity or atrophy. No significant LE edema. Neurologic: Sensation grossly intact in extremities.  Symmetrical.  Speech is fluent.  Psychiatric: Judgment intact, Mood & affect appropriate for pt's clinical situation. Dermatologic: No rashes or ulcers noted.  No cellulitis or open wounds.       Labs Recent Results (from the past 2160 hour(s))  Allergen Panel (27) + IGE     Status: None   Collection Time: 08/18/20 11:10 AM  Result Value Ref Range   Class Description Allergens Comment     Comment: (NOTE)    Levels of Specific IgE       Class  Description of  Class    ---------------------------  -----  --------------------                   < 0.10         0         Negative           0.10 -    0.31         0/I       Equivocal/Low           0.32 -    0.55  I         Low           0.56 -    1.40         II        Moderate           1.41 -    3.90         III       High           3.91 -   19.00         IV        Very High          19.01 -  100.00         V         Very High                  >100.00         VI        Very High    IgE (Immunoglobulin E), Serum 234 6 - 495 IU/mL   D Pteronyssinus IgE <0.10 Class 0 kU/L   D Farinae IgE <0.10 Class 0 kU/L   Cat Dander IgE <0.10 Class 0 kU/L   Dog Dander IgE <0.10 Class 0 kU/L   [place] Grass IgE <0.10 Class 0 kU/L   Timothy Grass IgE <0.10 Class 0 kU/L   [place] Bluegrass IgE <0.10 Class 0 kU/L   Johnson Grass IgE <0.10 Class 0 kU/L   Bahia Grass IgE <0.10 Class 0 kU/L   Cockroach, American IgE <0.10 Class 0 kU/L    Comment: (NOTE) This test was developed and its performance characteristics determined by [facility].  It has not been cleared or approved by the U.S. Food and Drug Administration. The FDA has determined that such clearance or approval is not necessary. This test is used for clinical purposes.  It should not be regarded as investigational or for research.    Penicillium Chrysogen IgE <0.10 Class 0 kU/L   Cladosporium Herbarum IgE <0.10 Class 0 kU/L   Aspergillus Fumigatus IgE <0.10 Class 0 kU/L   Mucor Racemosus IgE <0.10 Class 0 kU/L   Alternaria Alternata IgE <0.10 Class 0 kU/L   Setomelanomma Rostrat <0.10 Class 0 kU/L   Oak, White IgE <0.10 Class 0 kU/L   Elm, American IgE <0.10 Class 0 kU/L   Maple/Box Elder IgE <0.10 Class 0 kU/L   Common Silver Wendee Copp IgE <0.10 Class 0 kU/L   Hickory, White IgE <0.10 Class 0 kU/L    Comment: (NOTE) This test was developed and its performance characteristics determined by [facility].  It has not been cleared or approved by the U.S.  Food and Drug Administration. The FDA has determined that such clearance or approval is not necessary. This test is used for clinical purposes.  It should not be regarded as investigational or for research.    White Mulberry IgE <0.10 Class 0 kU/L   Cedar, [place] IgE <0.10 Class 0 kU/L   Ragweed, Short IgE <0.10 Class 0 kU/L   Plantain, English IgE <0.10 Class 0 kU/L   Cocklebur IgE <0.10 Class 0 kU/L   Pigweed, Rough IgE <0.10 Class 0 kU/L    Comment: (NOTE) Performed At: [facility] [address] [place], [place] [id number] Rush Farmer MD VO:[id number]   CBC with Differential/Platelet     Status: None   Collection Time: 08/18/20 11:10 AM  Result Value Ref Range  WBC 8.2 4.0 - 10.5 K/uL   RBC 4.90 3.87 - 5.11 MIL/uL   Hemoglobin 14.7 12.0 - 15.0 g/dL   HCT 42.0 36 - 46 %   MCV 85.7 80.0 - 100.0 fL   MCH 30.0 26.0 - 34.0 pg   MCHC 35.0 30.0 - 36.0 g/dL   RDW 13.6 11.5 - 15.5 %   Platelets 224 150 - 400 K/uL   nRBC 0.0 0.0 - 0.2 %   Neutrophils Relative % 62 %   Neutro Abs 5.1 1.7 - 7.7 K/uL   Lymphocytes Relative 28 %   Lymphs Abs 2.3 0.7 - 4.0 K/uL   Monocytes Relative 8 %   Monocytes Absolute 0.7 0.1 - 1.0 K/uL   Eosinophils Relative 1 %   Eosinophils Absolute 0.1 0.0 - 0.5 K/uL   Basophils Relative 1 %   Basophils Absolute 0.1 0.0 - 0.1 K/uL   Immature Granulocytes 0 %   Abs Immature Granulocytes 0.03 0.00 - 0.07 K/uL    Comment: Performed at [facility], [address] [place], [place] [address]    Radiology MR KNEE LEFT WO CONTRAST  Result Date: 10/22/2020 CLINICAL DATA:  Left knee pain for 2 years. No known injury. EXAM: MRI OF THE LEFT KNEE WITHOUT CONTRAST TECHNIQUE: Multiplanar, multisequence MR imaging of the knee was performed. No intravenous contrast was administered. COMPARISON:  None. FINDINGS: MENISCI Medial: Fraying of the free edge of the body of the medial meniscus. Lateral: Intact. LIGAMENTS Cruciates: Chronic partial tear  of the ACL origin involving the anterior fibers. Posterior fibers of the ACL are intact. Intact PCL. Collaterals: Medial collateral ligament is intact. Small amount of fluid superficial to the MCL which may be reactive versus mild MCL bursitis. Lateral collateral ligament complex is intact. CARTILAGE Patellofemoral: Partial-thickness cartilage loss of the lateral patellofemoral compartment. Medial: Extensive full-thickness cartilage loss of the medial femorotibial compartment with subchondral reactive marrow edema. Lateral:  Mild chondral irregularity of the lateral tibial plateau. JOINT: Moderate joint effusion. Mild edema in Hoffa's fat. No plical thickening. POPLITEAL FOSSA: Popliteus tendon is intact. No Baker's cyst. EXTENSOR MECHANISM: Intact quadriceps tendon. Intact patellar tendon. Intact medial patellar retinaculum. Intact lateral patellar retinaculum. Intact MPFL. BONES: No aggressive osseous lesion. No fracture or dislocation. Other: No fluid collection or hematoma. Muscles are normal. IMPRESSION: 1. Fraying of the free edge of the body of the medial meniscus. 2. Tricompartmental cartilage abnormalities as described above most severe in the medial femorotibial compartment. 3. 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 Posterior fibers of the ACL are intact. Electronically Signed   By: Kathreen Devoid   On: 10/22/2020 07:40    Assessment/Plan  Pulmonary embolus [facility]) The patient continues anticoagulation for pulmonary embolus and is now doing well.  Thrombectomy was done about 6 months ago with good results.  With her history of pulmonary embolus, she is a very high risk for recurrent thromboembolic complications with her upcoming surgeries including her knee replacement in January.  I will touch base with her orthopedic surgeon but I would recommend a filter be placed prior to her knee replacement to avoid or lessen the risk of pulmonary embolus.  Left leg DVT ([facility]) Legs  are currently doing well.  Continue anticoagulation.  Hypertension blood pressure control important in reducing the progression of atherosclerotic disease. On appropriate oral medications.   Osteoarthritis of left knee The patient has an upcoming knee replacement scheduled for December 14, 2020.  I think she should  probably have an IVC filter placed prior to this and will contact her orthopedic surgeon about making sure that is okay.  I will tentatively get her on the schedule for an IVC filter in early January.  We will plan removal of this filter 6 to 8 weeks after knee surgery.  Once the filter is in place, she can stop the Xarelto 2 days prior to her knee replacement and then resume it as soon postoperatively as her orthopedic surgeon feels safe.    [name], MD  10/24/2020 10:08 AM    This note was created with Dragon medical transcription system.  Any errors from dictation are purely unintentional

## 2020-10-24 NOTE — Telephone Encounter (Signed)
Spoke with the patient and she is scheduled with Dr. Lucky Cowboy for a IVC filter placement on 12/11/20 with a 6:45 am arrival time to the [facility]. Covid testing on 12/07/20 between 8-1 pm at the [facility]. Pre-procedure instructions were discussed and will be mailed

## 2020-10-24 NOTE — Assessment & Plan Note (Signed)
The patient continues anticoagulation for pulmonary embolus and is now doing well.  Thrombectomy was done about 6 months ago with good results.  With her history of pulmonary embolus, she is a very high risk for recurrent thromboembolic complications with her upcoming surgeries including her knee replacement in January.  I will touch base with her orthopedic surgeon but I would recommend a filter be placed prior to her knee replacement to avoid or lessen the risk of pulmonary embolus.

## 2020-10-24 NOTE — Assessment & Plan Note (Signed)
blood pressure control important in reducing the progression of atherosclerotic disease. On appropriate oral medications.  

## 2020-10-24 NOTE — Assessment & Plan Note (Signed)
The patient has an upcoming knee replacement scheduled for December 14, 2020.  I think she should probably have an IVC filter placed prior to this and will contact her orthopedic surgeon about making sure that is okay.  I will tentatively get her on the schedule for an IVC filter in early January.  We will plan removal of this filter 6 to 8 weeks after knee surgery.  Once the filter is in place, she can stop the Xarelto 2 days prior to her knee replacement and then resume it as soon postoperatively as her orthopedic surgeon feels safe.

## 2020-10-24 NOTE — Patient Instructions (Signed)
Inferior Vena Cava Filter Insertion Insertion of an inferior vena cava (IVC) filter is a procedure in which a filter is placed into the large vein in your abdomen that carries blood from the lower part of your body to your heart (inferior vena cava). This filter helps to prevent blood clots in the legs or pelvis from traveling to your lungs, which can be very dangerous. The filter is a small, metal device that is shaped like the spokes of an umbrella. The filter is inserted through a pathway that is created in the neck or groin. Filters are inserted when blood thinners (anticoagulants) cannot be used to prevent blood clots from forming. You may need filters rather than anticoagulants because you have:  Severe platelet problems or shortages.  Recent or current major bleeding that cannot be treated.  Bleeding associated with anticoagulants.  Recurrence of blood clots while taking anticoagulants.  A need for surgery in the near future.  Bleeding in your head.  Multiple broken bones. Tell a health care provider about:  Any allergies you have, including iodine.  All medicines you are taking, including vitamins, herbs, eye drops, creams, and over-the-counter medicines.  Any blood disorders you have.  Any problems you or family members have had with anesthetic medicines.  Any medical conditions you have.  Any surgeries you have had.  Whether you are pregnant or may be pregnant. What are the risks? Generally, this is a safe procedure. However, problems may occur, including:  The filter blocking the inferior vena cava. This can cause leg swelling.  The filter eventually failing and not working properly.  The filter moving and traveling to the heart or lungs.  Damage to the vein. This is rare.  Bleeding.  Allergic reactions to medicines or dyes.  Damage to other structures or organs.  Infection.  A pool of blood (hematoma) around the site where a flexible tube is put into a  large vein (catheter insertion site). What happens before the procedure? Staying hydrated Follow instructions from your health care provider about hydration, which may include:  Up to 2 hours before the procedure - you may continue to drink clear liquids, such as water, clear fruit juice, black coffee, and plain tea. Eating and drinking restrictions Follow instructions from your health care provider about eating and drinking, which may include:  8 hours before the procedure - stop eating heavy meals or foods such as meat, fried foods, or fatty foods.  6 hours before the procedure - stop eating light meals or foods, such as toast or cereal.  6 hours before the procedure - stop drinking milk or drinks that contain milk.  2 hours before the procedure - stop drinking clear liquids. Medicines   Ask your health care provider about: ? Changing or stopping your regular medicines. This is especially important if you are taking diabetes medicines or blood thinners. ? Taking medicines such as aspirin and ibuprofen. These medicines can thin your blood. Do not take these medicines before your procedure if your health care provider instructs you not to.  You may be given antibiotic medicine to help prevent infection. General instructions  Ask your health care provider how your surgical site will be marked or identified.  You may have blood tests. These tests can help to tell how well your kidneys and liver are working. They can also show how fast your blood is clotting.  You may be asked to shower with a germ-killing soap.  Plan to have someone take you  home from the hospital or clinic.  If you will be going home right after the procedure, plan to have someone with you for 24 hours.  Do not use any products that contain nicotine or tobacco, such as cigarettes and e-cigarettes. If you need help quitting, ask your health care provider. What happens during the procedure?  To lower your risk of  infection: ? Your health care team will wash or sanitize their hands. ? Your skin will be washed with soap. ? Hair may be removed from the surgical area.  An IV tube will be inserted into one of your veins.  You will be given one or more of the following: ? A medicine to help you relax (sedative). ? A medicine to numb the area (local anesthetic).  The procedure is done through a large vein in your neck or groin. A small cut (incision) will be made in this area.  A flexible tube (catheter) will be put into a large vein where the incision was made.  Contrast dye may be injected into the inferior vena cava to help guide the catheter.  X-rays may be used to make sure that the catheter is in the correct position.  The IVC filter will be inserted into the vein through the catheter until it reaches the correct location in the inferior vena cava.  The catheter will be removed.  Pressure will be applied to the insertion site to stop bleeding.  A bandage (dressing) may be applied over the catheter insertion site.  Your IV tube will be taken out. The procedure may vary among health care providers and hospitals. What happens after the procedure?  Your blood pressure, heart rate, breathing rate, and blood oxygen level will be monitored until the medicines you were given have worn off.  Your insertion site will be monitored for the first few hours for any signs of bleeding.  Do not drive for 24 hours if you were given a sedative. Summary  The inferior vena cava filter helps to prevent blood clots in the legs or pelvis from traveling to your lungs.  The IVC filter is inserted when anticoagulants cannot be used to prevent blood clots.  Plan to have someone take you home from the hospital or clinic, and plan to have someone with you for 24 hours if you will be going home right after the procedure. This information is not intended to replace advice given to you by your health care provider.  Make sure you discuss any questions you have with your health care provider. Document Revised: 10/31/2017 Document Reviewed: 10/09/2016 Elsevier Patient Education  2020 [employer].

## 2020-10-24 NOTE — Assessment & Plan Note (Signed)
Legs are currently doing well.  Continue anticoagulation.

## 2020-10-25 NOTE — Telephone Encounter (Signed)
LVM to schedule

## 2020-10-25 NOTE — Telephone Encounter (Signed)
Pt need appt with Dr Fletcher Anon or APP in [place] office

## 2020-10-31 ENCOUNTER — Other Ambulatory Visit: Payer: Self-pay | Admitting: Family Medicine

## 2020-10-31 DIAGNOSIS — I2699 Other pulmonary embolism without acute cor pulmonale: Secondary | ICD-10-CM

## 2020-10-31 DIAGNOSIS — I82402 Acute embolism and thrombosis of unspecified deep veins of left lower extremity: Secondary | ICD-10-CM

## 2020-10-31 NOTE — Telephone Encounter (Signed)
Pharmacy called pt needs a refill on XARELTO 20 MG TABS tablet sent to [facility]

## 2020-11-01 NOTE — Telephone Encounter (Signed)
LVM for patient to call back and schedule

## 2020-11-02 MED ORDER — RIVAROXABAN 20 MG PO TABS: ORAL_TABLET | ORAL | 2 refills | Status: DC

## 2020-11-06 ENCOUNTER — Other Ambulatory Visit: Payer: Medicare Other

## 2020-11-06 NOTE — Progress Notes (Unsigned)
Cardiology Office Note:    Date:  11/07/2020   ID:  Cindy Stein, DOB 20-Aug-1956, MRN [id number]  PCP:  Leone Haven, MD  [facility] Cardiologist:  [name], MD  [facility] Electrophysiologist:  None   Referring MD: Leone Haven, MD   Chief Complaint: Pre-op cardiac evaluation  History of Present Illness:    Cindy Stein is a 64 y.o. 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  She had a cath 06/2019 for DOE showing normal coronaries, preserved LVEF, mildly elevated LEDP suspected mild diastolic heart failure.She was on HCTZ and this was continued. She was last seen 07/2019 and was stable, no changes were made.   Today she presents for cardiac pre-op evaluation. The patient has an upcoming surgery for left knee replacement in January and an eye surgery early next year. Due to her history of DVT/PE vascular is planning to implant IVC filter prior to surgery so she will be able to stop Xarelto. The patient reports months of left knee pain. She has had multiple steroid injections, which has contributed to 10lb weight gain. She takes oxycodone. Despite the pain she is still very active. She cares for her husband. She is able to walk up a flight of stairs and 1-2 blocks and can do yard work. She is not a smoker. From a cardiac standpoint she is doing well. After having the DVT/PE she have breathing issues for the following 6 months.This slowly improved with Xarelto. Now she denies any breathing issues, sob, chest pain, palpitations, LLE, orthopnea, PND. She takes coreg, HCTZ and olmesartan for BP. Today pressure is well controlled. EKG with no acute changes.    Past Medical History:  Diagnosis Date  . Arthritis of left ankle   . Arthritis of left knee   .  Hypertension   . Plantar fascial fibromatosis   . Pulmonary embolus ([place]) 03/21/2020  . PVC (premature ventricular contraction)   . Unspecified osteoarthritis, unspecified site   . Vitamin D deficiency     Past Surgical History:  Procedure Laterality Date  . CARDIAC CATHETERIZATION  2006  . carpel tunnel    . GALLBLADDER SURGERY    . LEFT HEART CATH AND CORONARY ANGIOGRAPHY Left 06/14/2019   Procedure: LEFT HEART CATH AND CORONARY ANGIOGRAPHY;  Surgeon: Wellington Hampshire, MD;  Location: [place] [facility];  Service: Cardiovascular;  Laterality: Left;  . PULMONARY THROMBECTOMY Bilateral 03/22/2020   Procedure: PULMONARY THROMBECTOMY / THROMBOLYSIS;  Surgeon: Algernon Huxley, MD;  Location: [place] [facility];  Service: Cardiovascular;  Laterality: Bilateral;    Current Medications: Current Meds  Medication Sig  . acetaminophen (TYLENOL) 325 MG tablet Take 2 tablets (650 mg total) by mouth every 6 (six) hours as needed for mild pain (or Fever >/= 101).  Marland Kitchen albuterol (VENTOLIN HFA) 108 (90 Base) MCG/ACT inhaler Inhale 2 puffs into the lungs every 6 (six) hours as needed for wheezing or shortness of breath.  . carvedilol (COREG) 3.125 MG tablet Take 3.125 mg by mouth 2 (two) times daily with a meal.  . hydrochlorothiazide (HYDRODIURIL) 12.5 MG tablet Take 1 tablet (12.5 mg total) by mouth daily.  Marland Kitchen olmesartan (BENICAR) 20 MG tablet Take 1 tablet (20 mg total) by mouth daily.  Marland Kitchen oxyCODONE-acetaminophen (PERCOCET/ROXICET) 5-325  MG tablet Take by mouth every 4 (four) hours as needed for severe pain.  . rivaroxaban (XARELTO) 20 MG TABS tablet TAKE 1 TABLET BY MOUTH ONCE A DAY WTIH SUPPER     Allergies:   Hydrocodone and Tramadol   Social History   Socioeconomic History  . Marital status: Married    Spouse name: Jeno  . Number of children: 1  . Years of education: Not on file  . Highest education level: Not on file  Occupational History  . 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 Smoking status: Never Smoker  . Smokeless tobacco: Never Used  Vaping Use  . Vaping Use: Never used  Substance and Sexual Activity  . Alcohol use: Yes    Comment: occass  . Drug use: Never  . Sexual activity: Not Currently  Other Topics Concern  . Not on file  Social History Narrative   Lives at home with spouse   Social Determinants of Health   Financial Resource Strain: Low Risk   . Difficulty of Paying Living Expenses: Not very hard  Food Insecurity: No Food Insecurity  . Worried About Charity fundraiser in the Last Year: Never true  . Ran Out of Food in the Last Year: Never true  Transportation Needs: No Transportation Needs  . Lack of Transportation (Medical): No  . Lack of Transportation (Non-Medical): No  Physical Activity:   . Days of Exercise per Week: Not on file  . Minutes of Exercise per Session: Not on file  Stress: No Stress Concern Present  . Feeling of Stress : Not at all  Social Connections: Unknown  . Frequency of Communication with Friends and Family: More than three times a week  . Frequency of Social Gatherings with Friends and Family: More than three times a week  . Attends Religious Services: Not on file  . Active Member of Clubs or Organizations: Not on file  . Attends Archivist Meetings: Not on file  . Marital Status: Married     Family History: The patient's *family history includes Asthma in her father and mother; Heart failure in her father; Leukemia in her sister. There is no history of Breast cancer.  ROS:   Please see the history of present illness.     All other systems reviewed and are negative.  EKGs/Labs/Other Studies Reviewed:    The following studies were reviewed today:  Echo 07/07/20 1. Left ventricular ejection fraction, by estimation, is 60 to 65%. The  left ventricle has normal function. The left ventricle has no regional  wall motion abnormalities. Left ventricular diastolic parameters  were  normal.  2. Right ventricular systolic function is normal. The right ventricular  size is normal.  3. The mitral valve is normal in structure. No evidence of mitral valve  regurgitation. No evidence of mitral stenosis.  4. The aortic valve is tricuspid. Aortic valve regurgitation is not  visualized. No aortic stenosis is present.   Cardiac cath 06/2019  The left ventricular systolic function is normal.  LV end diastolic pressure is mildly elevated.  The left ventricular ejection fraction is 55-65% by visual estimate.   1.  Normal coronary arteries. 2.  Normal LV systolic function.  Mildly elevated left ventricular end-diastolic pressure post frequent PVCs.  Recommendations: Noncardiac chest pain.  Heart monitor 07/2019 7-day Zio patch monitor:  Normal sinus rhythm with an average heart rate of 73 bpm. 5 beat run of SVT. Rare  PACs with no evidence of PVCs.  Overall, unremarkable monitor.    EKG:  EKG is ordered today.  The ekg ordered today demonstrates SR, 68bpm, PRI 140ms, QTc 452ms, low voltage, nonspecific T wave changes.   Recent Labs: 06/27/2020: ALT 23; BUN 19; Creatinine, Ser 0.83; Potassium 3.9; Sodium 138; TSH 4.50 08/18/2020: Hemoglobin 14.7; Platelets 224  Recent Lipid Panel No results found for: CHOL, TRIG, HDL, CHOLHDL, VLDL, LDLCALC, LDLDIRECT    Physical Exam:    VS:  BP 118/68 (BP Location: Left Arm, Patient Position: Sitting, Cuff Size: Normal)   Pulse 68   Ht 5\' 2"  (1.575 m)   Wt 209 lb (94.8 kg)   SpO2 96%   BMI 38.23 kg/m     Wt Readings from Last 3 Encounters:  11/07/20 209 lb (94.8 kg)  10/24/20 211 lb (95.7 kg)  08/29/20 208 lb 9.6 oz (94.6 kg)     GEN:  Well nourished, well developed in no acute distress HEENT: Normal NECK: No JVD; No carotid bruits LYMPHATICS: No lymphadenopathy CARDIAC: RRR, no murmurs, rubs, gallops RESPIRATORY:  Clear to auscultation without rales, wheezing or rhonchi  ABDOMEN: Soft, non-tender,  non-distended MUSCULOSKELETAL:  No edema; No deformity  SKIN: Warm and dry NEUROLOGIC:  Alert and oriented x 3 PSYCHIATRIC:  Normal affect   ASSESSMENT:    1. Preoperative cardiovascular examination   2. Essential hypertension    PLAN:    Cardiac pre-op evaluation Planning for left  Knee replacement in January 2021 as well as eye surgery early next year. With her hx of PE/DVT post Rt knee replacement VVS planning to place IVC filter so she can stop Xarelto. Dr. Lucky Cowboy from vascular surgery prescribes Xarelto and any recommendations regarding holding prior to surgery will need to come form his office. From a cardiac perspective patient is symptomatically stable. No chest pain, sob, LLE, orthopnea, PND, palpitations. She had a cath in 2020 showing normal coronaries. Echo 07/2020 showed LVEF 60-65%, no WMA. BP is controlled on HCTZ, coreg, and Olmesartan. Despite knee pain patient is still relatively active and can perform 4 METS. EKG today is stable. No further testing prior to planned surgery. Therefore, based on ACC/AHA guidelines, she would be at acceptable risk for the planned procedure without further cardiovascular testing.  She was advised that if new symptoms develop prior to surgery to contact our office. I will route this recommendation to the requesting party via Epic fax function. Please call with questions.  According to the Revised Cardiac Risk Index (RCRI), her Perioperative Risk of Major Cardiac Event is (%): 0.4  Her Functional Capacity in METs is: 6.36 according to the Duke Activity Status Index (DASI).    Shared Decision Making/Informed Consent        Signed, Cadence Ninfa Meeker, PA-C  11/07/2020 1:27 PM    [facility]

## 2020-11-07 ENCOUNTER — Encounter: Payer: Self-pay | Admitting: Medical

## 2020-11-07 ENCOUNTER — Ambulatory Visit (INDEPENDENT_AMBULATORY_CARE_PROVIDER_SITE_OTHER): Payer: Medicare Other | Admitting: Medical

## 2020-11-07 ENCOUNTER — Other Ambulatory Visit: Payer: Self-pay

## 2020-11-07 VITALS — BP 118/68 | HR 68 | Ht 62.0 in | Wt 209.0 lb

## 2020-11-07 DIAGNOSIS — Z01810 Encounter for preprocedural cardiovascular examination: Secondary | ICD-10-CM | POA: Diagnosis not present

## 2020-11-07 DIAGNOSIS — I10 Essential (primary) hypertension: Secondary | ICD-10-CM | POA: Diagnosis not present

## 2020-11-07 NOTE — Patient Instructions (Signed)
Medication Instructions:  No changes  *If you need a refill on your cardiac medications before your next appointment, please call your pharmacy*   Lab Work: None  If you have labs (blood work) drawn today and your tests are completely normal, you will receive your results only by: Marland Kitchen MyChart Message (if you have MyChart) OR . A paper copy in the mail If you have any lab test that is abnormal or we need to change your treatment, we will call you to review the results.   Testing/Procedures: None   Follow-Up: At [facility], you and your health needs are our priority.  As part of our continuing mission to provide you with exceptional heart care, we have created designated Provider Care Teams.  These Care Teams include your primary Cardiologist (physician) and Advanced Practice Providers (APPs -  Physician Assistants and Nurse Practitioners) who all work together to provide you with the care you need, when you need it.  We recommend signing up for the patient portal called "MyChart".  Sign up information is provided on this After Visit Summary.  MyChart is used to connect with patients for Virtual Visits (Telemedicine).  Patients are able to view lab/test results, encounter notes, upcoming appointments, etc.  Non-urgent messages can be sent to your provider as well.   To learn more about what you can do with MyChart, go to [email or web address].    Your next appointment:   1 year(s)  The format for your next appointment:   In Person  Provider:   You may see [name], MD or one of the following Advanced Practice Providers on your designated Care Team:    Murray Hodgkins, NP  Christell Faith, PA-C  Marrianne Mood, PA-C  Cadence Mosheim, [place]  Laurann Montana, NP

## 2020-11-08 ENCOUNTER — Ambulatory Visit: Payer: Medicare Other | Admitting: Pharmacist

## 2020-11-08 DIAGNOSIS — R918 Other nonspecific abnormal finding of lung field: Secondary | ICD-10-CM

## 2020-11-08 DIAGNOSIS — I493 Ventricular premature depolarization: Secondary | ICD-10-CM

## 2020-11-08 DIAGNOSIS — Z6836 Body mass index (BMI) 36.0-36.9, adult: Secondary | ICD-10-CM

## 2020-11-08 DIAGNOSIS — I10 Essential (primary) hypertension: Secondary | ICD-10-CM

## 2020-11-08 MED ORDER — OLMESARTAN MEDOXOMIL-HCTZ 20-12.5 MG PO TABS: 1.0000 | ORAL_TABLET | Freq: Every day | ORAL | 1 refills | Status: DC

## 2020-11-08 NOTE — Patient Instructions (Signed)
Isola,   It was great talking with you! Here's how I recommend you take your daily medications:   Morning: - Olmesartan/HCTZ 20/12.5 mg - Carvedilol 3.125 mg   Evening: - Carvedilol 3.125  - Xarelto 20 mg daily  Take care, and call me if you need anything in the meantime!  Catie Darnelle Maffucci, PharmD [phone]   Visit Information  Patient Care Plan: Medication Management    Problem Identified: Hypertension, Hx DVT and PE, OA, Obesity   Note:       Long-Range Goal: Disease Progression Prevention   This Visit's Progress: On track  Priority: High  Note:   Current Barriers:  . Unable to independently afford treatment regimen . Does not adhere to prescribed medication regimen  Pharmacist Clinical Goal(s):  Marland Kitchen Over the next 90 days, patient will verbalize ability to afford treatment regimen. . Over the next 90 days, patient will achieve adherence to monitoring guidelines and medication adherence to achieve therapeutic efficacy. through collaboration with PharmD and provider.   Interventions: . Inter-disciplinary care team collaboration (see longitudinal plan of care) . Comprehensive medication review performed; medication list updated in electronic medical record  Hypertension: . Controlled; current treatment: olmesartan 20/HCTZ 12.5 mg daily (notes that the pharmacy is dispensing the combined tablet to reduce costs (no prescription drug insurance); carvedilol 3.125 mg BID - though is only taking QAM . Current home readings: reports morning readings ~ 110-120s/60-70s . Discussed duration of action of carvedilol and importance of taking BID. Discussed taking 1 tab QAM w/ olmesartan/HCTZ and 1 tab QPM w/ Xarelto. Patient verbalized understanding . Sending updated combo script for olmesartan HCTZ to reflect what patient is currently taking.  . Consider checking lipid panel with next lab work evaluate cardiovascular risk. No lipid panel on file.   Hx DVT, PE, unprovoked,  previously s/p knee replacement . Appropriately managed. Plan to have IVC filter placed 1/10 to allow for safe continued holding of Xarelto for knee replacement surgery on 1/16. Collaboration ongoing between vascular and orthopedics o Approved for Xarelto assistance through [employer] through 04/18/21. BIN [id number] Group [id number] ID [id number] . Recommended to continue current regimen at this time. Will plan to collaborate with patient and providers to pursue reapplication for Xarelto assistance in May   Patient Goals/Self-Care Activities . Over the next 90 days, patient will:  - take medications as prescribed check blood pressure periodically, document, and provide at future appointments collaborate with provider on medication access solutions  Follow Up Plan: Telephone follow up appointment with care management team member scheduled for: ~ 12 weeks       The patient verbalized understanding of instructions, educational materials, and care plan provided today and agreed to receive a mailed copy of patient instructions, educational materials, and care plan.  Plan: Telephone follow up appointment with care management team member scheduled for:  ~ 72 weeks  Catie Darnelle Maffucci, PharmD, [place], [place] Pharmacist [place] [phone]

## 2020-11-08 NOTE — Chronic Care Management (AMB) (Signed)
Chronic Care Management   Pharmacy Note  11/08/2020 Name: Cindy Stein MRN: [id number] DOB: [birth year]   Subjective:  Cindy Stein is a 64 y.o. year old female who is a primary care patient of [name], [name], MD. The CCM team was consulted for assistance with chronic disease management and care coordination needs.    Engaged with patient by telephone for follow up visit in response to provider referral for pharmacy case management and/or care coordination services.   Consent to Services:  [name] was given information about Chronic Care Management services, agreed to services, and gave verbal consent prior to initiation of services on 03/30/20. Please see initial visit note for detailed documentation.   SDOH (Social Determinants of Health) assessments and interventions performed:  SDOH Interventions     Most Recent Value  SDOH Interventions  Financial Strain Interventions Other (Comment)  [manufacturer assistance]       Objective:  Lab Results  Component Value Date   CREATININE 0.83 06/27/2020   CREATININE 0.72 05/02/2020   CREATININE 1.08 04/28/2020   Cannot calculate ASCVD risk due to no lipid panel on file.   BP Readings from Last 3 Encounters:  11/07/20 118/68  10/24/20 114/68  08/29/20 120/70    Assessment/Interventions: Review of patient past medical history, allergies, medications, health status, including review of consultants reports, laboratory and other test data, was performed as part of comprehensive evaluation and provision of chronic care management services.   Allergies  Allergen Reactions  . Hydrocodone   . Tramadol     Medications Reviewed Today    Reviewed by De Hollingshead, RPH-CPP (Pharmacist) on 11/08/20 at 1143  Med List Status: <None>  Medication Order Taking? Sig Documenting Provider Last Dose Status Informant  acetaminophen (TYLENOL) 325 MG tablet [id number] No Take 2 tablets (650 mg total) by mouth every 6 (six) hours as  needed for mild pain (or Fever >/= 101).  Patient not taking: Reported on 11/08/2020   Charlynne Cousins, MD Not Taking Active   albuterol (VENTOLIN HFA) 108 (90 Base) MCG/ACT inhaler [id number] No Inhale 2 puffs into the lungs every 6 (six) hours as needed for wheezing or shortness of breath.  Patient not taking: Reported on 11/08/2020   Melvenia Needles, NP Not Taking Active   carvedilol (COREG) 3.125 MG tablet [id number] Yes Take 3.125 mg by mouth 2 (two) times daily with a meal. [provider] Taking Active   hydrochlorothiazide (HYDRODIURIL) 12.5 MG tablet [id number] Yes Take 1 tablet (12.5 mg total) by mouth daily. Leone Haven, MD Taking Active   olmesartan (BENICAR) 20 MG tablet [id number] Yes Take 1 tablet (20 mg total) by mouth daily. Leone Haven, MD Taking Active            Med Note Janan Ridge   Tue Nov 07, 2020  8:29 AM)    oxyCODONE-acetaminophen (PERCOCET/ROXICET) 5-325 MG tablet [id number] Yes Take by mouth every 4 (four) hours as needed for severe pain. [provider] Taking Active            Med Note De Hollingshead   Wed Nov 08, 2020 11:42 AM) Taking every 6 hours  rivaroxaban (XARELTO) 20 MG TABS tablet [id number] Yes TAKE 1 TABLET BY MOUTH ONCE A DAY WTIH SUPPER Leone Haven, MD Taking Active           Patient Active Problem List   Diagnosis Date Noted  . History of cholecystectomy 10/24/2020  . History of decompression of  median nerve 10/24/2020  . History of total knee arthroplasty 10/24/2020  . Hypertensive disorder 10/24/2020  . Osteoarthritis of left knee 10/24/2020  . Dyspnea on exertion 08/29/2020  . Obesity 08/29/2020  . Palpitations 06/27/2020  . Pulmonary nodules 03/28/2020  . Pulmonary embolus ([place]) 03/21/2020  . Left leg DVT ([place]) 03/21/2020  . Hypertension   . PVC (premature ventricular contraction)     Medication Assistance: Xarelto obtained through [employer] medication assistance program. Renewal date  04/2021.   Patient Care Plan: Medication Management    Problem Identified: Hypertension, Hx DVT and PE, OA, Obesity   Note:       Long-Range Goal: Disease Progression Prevention   This Visit's Progress: On track  Priority: High  Note:   Current Barriers:  . Unable to independently afford treatment regimen . Does not adhere to prescribed medication regimen  Pharmacist Clinical Goal(s):  Marland Kitchen Over the next 90 days, patient will verbalize ability to afford treatment regimen. . Over the next 90 days, patient will achieve adherence to monitoring guidelines and medication adherence to achieve therapeutic efficacy. through collaboration with PharmD and provider.   Interventions: . Inter-disciplinary care team collaboration (see longitudinal plan of care) . Comprehensive medication review performed; medication list updated in electronic medical record  Hypertension: . Controlled; current treatment: olmesartan 20/HCTZ 12.5 mg daily (notes that the pharmacy is dispensing the combined tablet to reduce costs (no prescription drug insurance); carvedilol 3.125 mg BID - though is only taking QAM . Current home readings: reports morning readings ~ 110-120s/60-70s . Discussed duration of action of carvedilol and importance of taking BID. Discussed taking 1 tab QAM w/ olmesartan/HCTZ and 1 tab QPM w/ Xarelto. Patient verbalized understanding . Sending updated combo script for olmesartan HCTZ to reflect what patient is currently taking.  . Consider checking lipid panel with next lab work evaluate cardiovascular risk. No lipid panel on file.   Hx DVT, PE, unprovoked, previously s/p knee replacement . Appropriately managed. Plan to have IVC filter placed 1/10 to allow for safe continued holding of Xarelto for knee replacement surgery on 1/16. Collaboration ongoing between vascular and orthopedics o Approved for Xarelto assistance through [employer] through 04/18/21. BIN [id number] Group [id number] ID  [id number] . Recommended to continue current regimen at this time. Will plan to collaborate with patient and providers to pursue reapplication for Xarelto assistance in May   Patient Goals/Self-Care Activities . Over the next 90 days, patient will:  - take medications as prescribed check blood pressure periodically, document, and provide at future appointments collaborate with provider on medication access solutions  Follow Up Plan: Telephone follow up appointment with care management team member scheduled for: ~ 12 weeks        Plan: Telephone follow up appointment with care management team member scheduled for:  ~ 12 weeks  Catie Darnelle Maffucci, PharmD, [place], [place] Pharmacist [place] [phone]

## 2020-11-26 IMAGING — MR MR KNEE*L* W/O CM
7 series · 40 of 40 positions shown · non-contrast
Comparison: None.

CLINICAL DATA: Left knee pain for 2 years. No known injury.

EXAM:
MRI OF THE LEFT KNEE WITHOUT CONTRAST
TECHNIQUE: Multiplanar, multisequence MR imaging of the knee was performed. No
intravenous contrast was administered.

[Series 3: T2 fat-sat · axial · 4.0mm · 0.53mm/px · z∈[-100,+70]mm · 6 of 35 slices shown (1 of 3)]
[im 1/35]
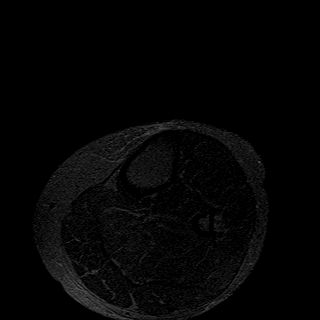
[im 7/35]
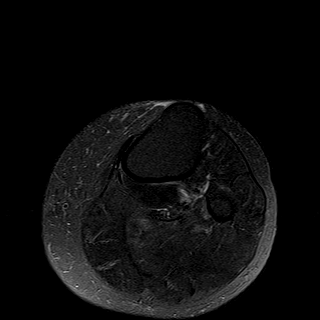
[im 14/35]
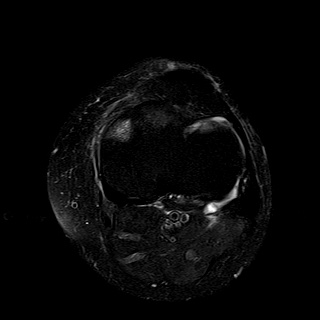
[im 21/35]
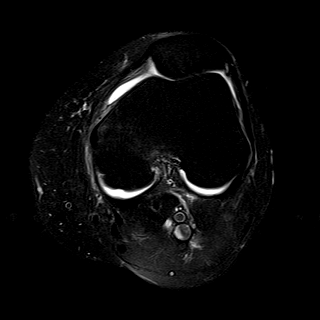
[im 28/35]
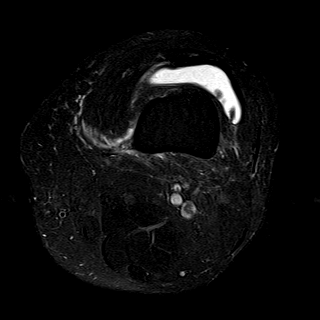
[im 35/35]
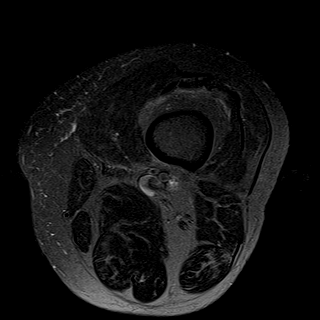

[Series 4: T1 · coronal · 4.0mm · 0.62mm/px · 6 of 31 slices shown]
[im 1/31]
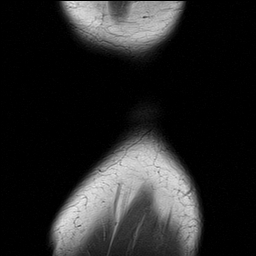
[im 7/31]
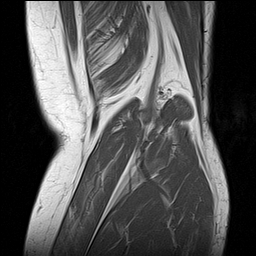
[im 13/31]
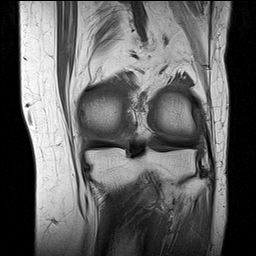
[im 19/31]
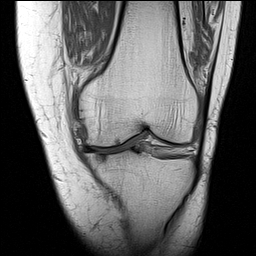
[im 25/31]
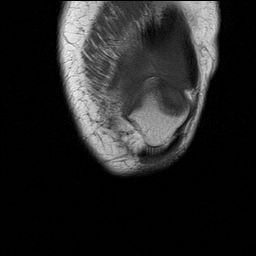
[im 31/31]
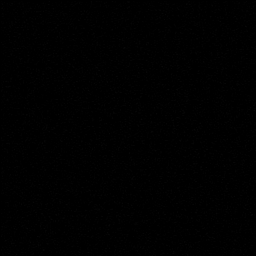

[Series 5: T2 fat-sat · coronal · 4.0mm · 0.62mm/px · 6 of 31 slices shown (2 of 3)]
[im 1/31]
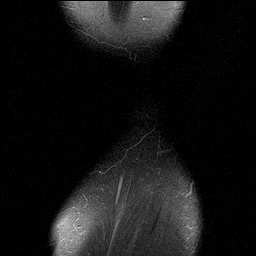
[im 7/31]
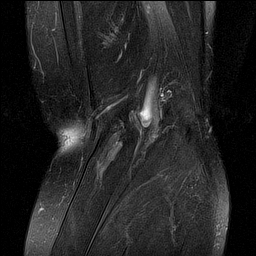
[im 13/31]
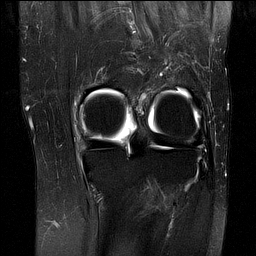
[im 19/31]
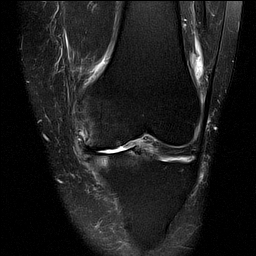
[im 25/31]
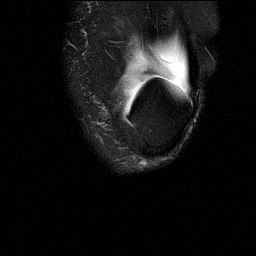
[im 31/31]
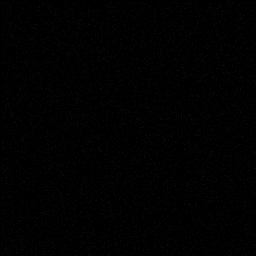

[Series 6: PD fat-sat · coronal · 4.0mm · 0.62mm/px · 6 of 31 slices shown (1 of 3)]
[im 1/31]
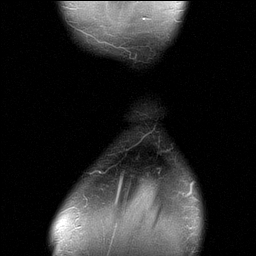
[im 7/31]
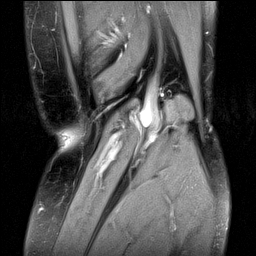
[im 13/31]
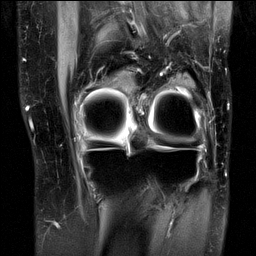
[im 19/31]
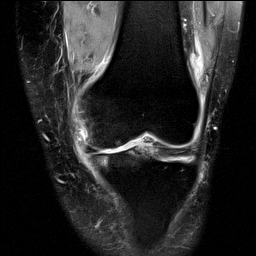
[im 25/31]
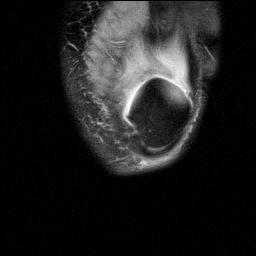
[im 31/31]
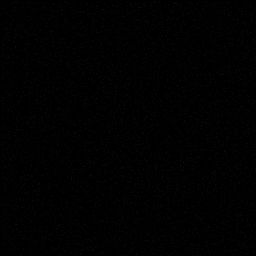

[Series 7: PD fat-sat · sagittal · 3.0mm · 0.62mm/px · 6 of 35 slices shown (2 of 3)]
[im 1/35]
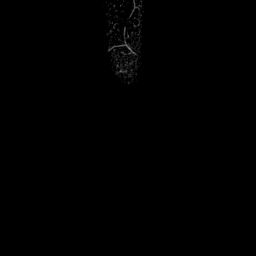
[im 7/35]
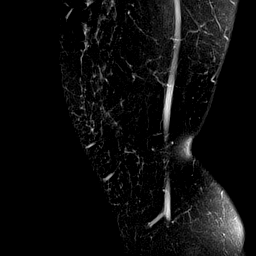
[im 14/35]
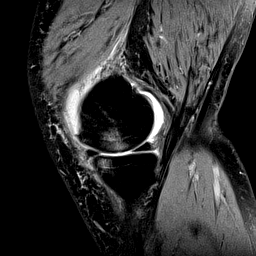
[im 21/35]
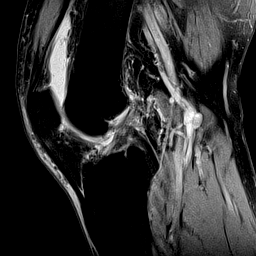
[im 28/35]
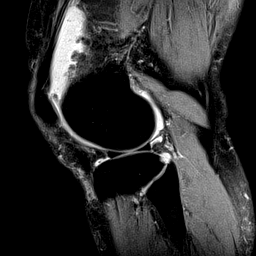
[im 35/35]
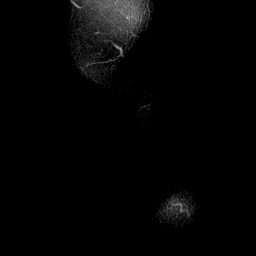

[Series 8: T2 fat-sat · sagittal · 3.0mm · 0.62mm/px · 6 of 35 slices shown (3 of 3)]
[im 1/35]
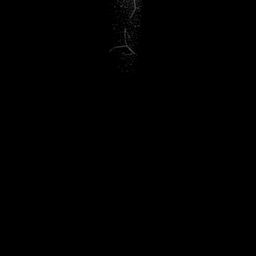
[im 7/35]
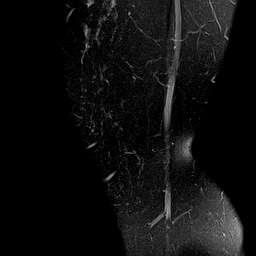
[im 14/35]
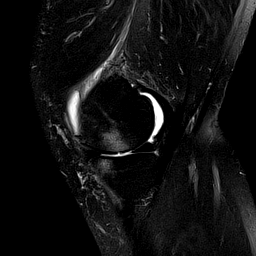
[im 21/35]
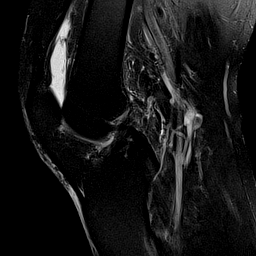
[im 28/35]
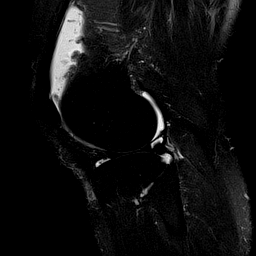
[im 35/35]
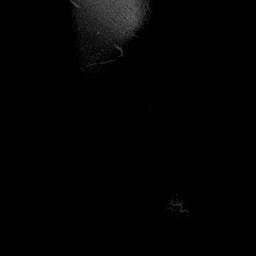

[Series 9: PD fat-sat · coronal · 2.0mm · 0.62mm/px · 4 of 19 slices shown (3 of 3)]
[im 1/19]
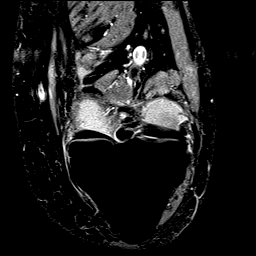
[im 7/19]
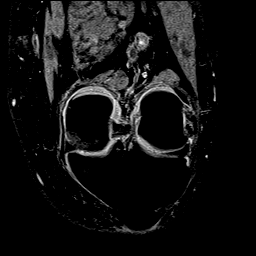
[im 13/19]
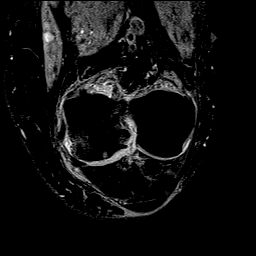
[im 19/19]
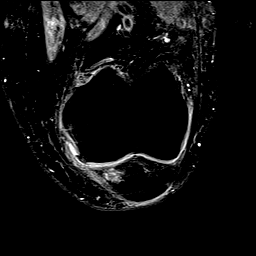

[40 of 40 positions shown; findings below may reference images not displayed]

FINDINGS: MENISCI

Medial: Fraying of the free edge of the body of the medial meniscus.

Lateral: Intact.

LIGAMENTS

Cruciates: Chronic partial tear of the ACL origin involving the
anterior fibers. Posterior fibers of the ACL are intact. Intact PCL.

Collaterals: Medial collateral ligament is intact. Small amount of
fluid superficial to the MCL which may be reactive versus mild MCL
bursitis. Lateral collateral ligament complex is intact.

CARTILAGE

Patellofemoral: Partial-thickness cartilage loss of the lateral
patellofemoral compartment.

Medial: Extensive full-thickness cartilage loss of the medial
femorotibial compartment with subchondral reactive marrow edema.

Lateral:  Mild chondral irregularity of the lateral tibial plateau.

JOINT: Moderate joint effusion. Mild edema in Hoffa's fat. No plical
thickening.

POPLITEAL FOSSA: Popliteus tendon is intact. No Baker's cyst.

EXTENSOR MECHANISM: Intact quadriceps tendon. Intact patellar
tendon. Intact medial patellar retinaculum. Intact lateral patellar
retinaculum. Intact MPFL.

BONES: No aggressive osseous lesion. No fracture or dislocation.

Other: No fluid collection or hematoma. Muscles are normal.
IMPRESSION: 1. Fraying of the free edge of the body of the medial meniscus.
2. Tricompartmental cartilage abnormalities as described above most
severe in the medial femorotibial compartment.
3. Chronic partial tear of the ACL origin involving the anterior
fibers. Posterior fibers of the ACL are intact.

## 2020-11-28 ENCOUNTER — Telehealth (INDEPENDENT_AMBULATORY_CARE_PROVIDER_SITE_OTHER): Payer: Medicare Other | Admitting: Family Medicine

## 2020-11-28 ENCOUNTER — Encounter: Payer: Self-pay | Admitting: Family Medicine

## 2020-11-28 ENCOUNTER — Telehealth: Payer: Self-pay

## 2020-11-28 ENCOUNTER — Other Ambulatory Visit: Payer: Self-pay

## 2020-11-28 ENCOUNTER — Telehealth: Payer: Self-pay | Admitting: Family Medicine

## 2020-11-28 DIAGNOSIS — Z86711 Personal history of pulmonary embolism: Secondary | ICD-10-CM | POA: Diagnosis not present

## 2020-11-28 DIAGNOSIS — I10 Essential (primary) hypertension: Secondary | ICD-10-CM

## 2020-11-28 DIAGNOSIS — Z01818 Encounter for other preprocedural examination: Secondary | ICD-10-CM

## 2020-11-28 DIAGNOSIS — R7309 Other abnormal glucose: Secondary | ICD-10-CM

## 2020-11-28 DIAGNOSIS — R0609 Other forms of dyspnea: Secondary | ICD-10-CM

## 2020-11-28 DIAGNOSIS — R0600 Dyspnea, unspecified: Secondary | ICD-10-CM | POA: Diagnosis not present

## 2020-11-28 NOTE — Assessment & Plan Note (Signed)
Well-controlled.  Continue olmesartan/HCTZ 1 tablet daily.

## 2020-11-28 NOTE — Telephone Encounter (Signed)
Please contact the patient. She needs an A1c and a urine test for her surgical clearance for her knee surgery. Please get her scheduled for this some time this week. Orders placed.

## 2020-11-28 NOTE — Assessment & Plan Note (Signed)
Patient is scheduled for IVC filter placement.  Medically she is stable.  She has been cleared by cardiology as well.  She notes she was advised by vascular surgery to discontinue her Xarelto 3 days prior to the IVC filter and it appears vascular surgery also recommended stopping Xarelto 2 days prior to her knee replacement and then it can be resumed at the discretion of the orthopedic surgeon when it is safe.

## 2020-11-28 NOTE — Telephone Encounter (Signed)
I called and LVM for the patient to call back to schedule a lab appointment.  Breezy Hertenstein,cma

## 2020-11-28 NOTE — Assessment & Plan Note (Signed)
Patient notes complete resolution of this at this time.

## 2020-11-28 NOTE — Progress Notes (Signed)
Virtual Visit via telephone Note  This visit type was conducted due to national recommendations for restrictions regarding the COVID-19 pandemic (e.g. social distancing).  This format is felt to be most appropriate for this patient at this time.  All issues noted in this document were discussed and addressed.  No physical exam was performed (except for noted visual exam findings with Video Visits).   I connected with Cindy Stein today at 10:00 AM EST by telephone and verified that I am speaking with the correct person using two identifiers. Location patient: home Location provider: home office Persons participating in the virtual visit: patient, provider  I discussed the limitations, risks, security and privacy concerns of performing an evaluation and management service by telephone and the availability of in person appointments. I also discussed with the patient that there may be a patient responsible charge related to this service. The patient expressed understanding and agreed to proceed.  Interactive audio and video telecommunications were attempted between this provider and patient, however failed, due to patient having technical difficulties OR patient did not have access to video capability.  We continued and completed visit with audio only.   Reason for visit: f/u  HPI: HYPERTENSION  Disease Monitoring  Home BP Monitoring 120/70 chest pain-no    dyspnea-no Medications  Compliance-taking olmesartan/HCTZ.  Edema-no  History of PE: Patient is scheduled for an IVC filter to be placed by vascular surgery.  She is due to have a knee replacement and they want the IVC filter in place given her history of PE following prior knee replacement.  She will be off of her Xarelto leading into the IVC filter and then leading into her knee replacement.  The patient requests amoxicillin to have on hand to take if she needs it.  She notes her prior physician would not let her have this on hand  to take if she developed a sore throat.  She notes she would take 2 to 3 days of it and her symptoms are typically resolved.  No current symptoms.     ROS: See pertinent positives and negatives per HPI.  Past Medical History:  Diagnosis Date  . Arthritis of left ankle   . Arthritis of left knee   . Hypertension   . Plantar fascial fibromatosis   . Pulmonary embolus (HCC) 03/21/2020  . PVC (premature ventricular contraction)   . Unspecified osteoarthritis, unspecified site   . Vitamin D deficiency     Past Surgical History:  Procedure Laterality Date  . CARDIAC CATHETERIZATION  2006  . carpel tunnel    . GALLBLADDER SURGERY    . LEFT HEART CATH AND CORONARY ANGIOGRAPHY Left 06/14/2019   Procedure: LEFT HEART CATH AND CORONARY ANGIOGRAPHY;  Surgeon: Iran Ouch, MD;  Location: [facility] INVASIVE CV LAB;  Service: Cardiovascular;  Laterality: Left;  . PULMONARY THROMBECTOMY Bilateral 03/22/2020   Procedure: PULMONARY THROMBECTOMY / THROMBOLYSIS;  Surgeon: Annice Needy, MD;  Location: [facility] INVASIVE CV LAB;  Service: Cardiovascular;  Laterality: Bilateral;    Family History  Problem Relation Age of Onset  . Asthma Mother   . Asthma Father   . Heart failure Father   . Leukemia Sister   . Breast cancer Neg Hx     SOCIAL HX: Non-smoker   Current Outpatient Medications:  .  acetaminophen (TYLENOL) 325 MG tablet, Take 2 tablets (650 mg total) by mouth every 6 (six) hours as needed for mild pain (or Fever >/= 101)., Disp:  , Rfl:  .  albuterol (VENTOLIN HFA) 108 (90 Base) MCG/ACT inhaler, Inhale 2 puffs into the lungs every 6 (six) hours as needed for wheezing or shortness of breath., Disp: 8 g, Rfl: 2 .  carvedilol (COREG) 3.125 MG tablet, Take 3.125 mg by mouth 2 (two) times daily with a meal., Disp: , Rfl:  .  olmesartan-hydrochlorothiazide (BENICAR HCT) 20-12.5 MG tablet, Take 1 tablet by mouth daily., Disp: 90 tablet, Rfl: 1 .  oxyCODONE-acetaminophen (PERCOCET/ROXICET) 5-325  MG tablet, Take by mouth every 4 (four) hours as needed for severe pain., Disp: , Rfl:  .  rivaroxaban (XARELTO) 20 MG TABS tablet, TAKE 1 TABLET BY MOUTH ONCE A DAY WTIH SUPPER, Disp: 30 tablet, Rfl: 2  EXAM: This was a telephone visit and thus no physical exam was completed.  ASSESSMENT AND PLAN:  Discussed the following assessment and plan:  Problem List Items Addressed This Visit    Dyspnea on exertion    Patient notes complete resolution of this at this time.      History of pulmonary embolus (PE)    Patient is scheduled for IVC filter placement.  Medically she is stable.  She has been cleared by cardiology as well.  She notes she was advised by vascular surgery to discontinue her Xarelto 3 days prior to the IVC filter and it appears vascular surgery also recommended stopping Xarelto 2 days prior to her knee replacement and then it can be resumed at the discretion of the orthopedic surgeon when it is safe.      Hypertension    Well-controlled.  Continue olmesartan/HCTZ 1 tablet daily.       I advised against having antibiotics on hand to take at patient discretion.  Discussed having the patient contact our office if she develops symptoms where she feels as though she may need an antibiotic.  Health maintenance: Patient notes she will get the COVID-19 vaccine when she gets through her upcoming surgeries.  I encouraged her to get the Pfizer or moderna vaccine.   I discussed the assessment and treatment plan with the patient. The patient was provided an opportunity to ask questions and all were answered. The patient agreed with the plan and demonstrated an understanding of the instructions.   The patient was advised to call back or seek an in-person evaluation if the symptoms worsen or if the condition fails to improve as anticipated.  I provided 8 minutes of non-face-to-face time during this encounter.   Marikay Alar, MD

## 2020-11-29 NOTE — Telephone Encounter (Signed)
Patient called and was scheduled for labs for surgery clearance.  Carle Dargan,cma

## 2020-11-30 ENCOUNTER — Other Ambulatory Visit: Payer: Self-pay

## 2020-11-30 ENCOUNTER — Other Ambulatory Visit: Payer: Self-pay | Admitting: Family Medicine

## 2020-11-30 ENCOUNTER — Other Ambulatory Visit (INDEPENDENT_AMBULATORY_CARE_PROVIDER_SITE_OTHER): Payer: Medicare Other

## 2020-11-30 DIAGNOSIS — R8290 Unspecified abnormal findings in urine: Secondary | ICD-10-CM

## 2020-11-30 DIAGNOSIS — Z01818 Encounter for other preprocedural examination: Secondary | ICD-10-CM

## 2020-11-30 DIAGNOSIS — R7309 Other abnormal glucose: Secondary | ICD-10-CM | POA: Diagnosis not present

## 2020-11-30 LAB — POCT URINALYSIS DIPSTICK
Glucose, UA: NEGATIVE
Leukocytes, UA: NEGATIVE
Nitrite, UA: NEGATIVE
Protein, UA: POSITIVE — AB
Spec Grav, UA: >=1.03 — AB (ref 1.010–1.025)
Urobilinogen, UA: 1 E.U./dL
pH, UA: 5 (ref 5.0–8.0)

## 2020-11-30 LAB — HEMOGLOBIN A1C: Hgb A1c MFr Bld: 6.1 % (ref 4.6–6.5)

## 2020-11-30 NOTE — Addendum Note (Signed)
Addended by: Addiel Mccardle S on: 11/30/2020 03:07 PM   Modules accepted: Orders  

## 2020-11-30 NOTE — Addendum Note (Signed)
Addended by: Warden Fillers on: 11/30/2020 03:07 PM   Modules accepted: Orders

## 2020-12-02 HISTORY — PX: REPLACEMENT TOTAL KNEE: SUR1224

## 2020-12-02 LAB — URINALYSIS, MICROSCOPIC ONLY
Bacteria, UA: NONE SEEN
Casts: NONE SEEN /lpf
Epithelial Cells (non renal): >10 /hpf — AB (ref 0–10)
WBC, UA: NONE SEEN /hpf (ref 0–5)

## 2020-12-05 ENCOUNTER — Ambulatory Visit: Payer: Medicare Other | Admitting: Dermatology

## 2020-12-05 ENCOUNTER — Telehealth (INDEPENDENT_AMBULATORY_CARE_PROVIDER_SITE_OTHER): Payer: Self-pay

## 2020-12-05 DIAGNOSIS — Z713 Dietary counseling and surveillance: Secondary | ICD-10-CM | POA: Insufficient documentation

## 2020-12-05 NOTE — Telephone Encounter (Signed)
I attempted to contact the patient to give the updated arrival time of 8:45 am to the MM instead of 6:45 am. A message was left for a return call.

## 2020-12-07 ENCOUNTER — Other Ambulatory Visit
Admission: RE | Admit: 2020-12-07 | Discharge: 2020-12-07 | Disposition: A | Discharge status: Home / Self Care | Payer: Medicare Other | Source: Ambulatory Visit | Attending: Vascular Surgery | Admitting: Vascular Surgery

## 2020-12-07 ENCOUNTER — Other Ambulatory Visit: Payer: Self-pay

## 2020-12-07 DIAGNOSIS — Z20822 Contact with and (suspected) exposure to covid-19: Secondary | ICD-10-CM | POA: Diagnosis not present

## 2020-12-07 DIAGNOSIS — Z01812 Encounter for preprocedural laboratory examination: Secondary | ICD-10-CM | POA: Diagnosis present

## 2020-12-07 LAB — SARS CORONAVIRUS 2 (TAT 6-24 HRS): SARS Coronavirus 2: NEGATIVE

## 2020-12-08 ENCOUNTER — Telehealth: Payer: Self-pay

## 2020-12-08 DIAGNOSIS — R3129 Other microscopic hematuria: Secondary | ICD-10-CM

## 2020-12-08 NOTE — Telephone Encounter (Signed)
Patient called back after I talked to her and declined the referral for urology and states she would like to see urology. States she will try to schedule with them around what she has going on.

## 2020-12-10 NOTE — Addendum Note (Signed)
Addended by: Leone Haven on: 12/10/2020 03:32 PM   Modules accepted: Orders

## 2020-12-10 NOTE — Telephone Encounter (Signed)
Referral to urology placed

## 2020-12-11 ENCOUNTER — Ambulatory Visit
Admission: RE | Admit: 2020-12-11 | Discharge: 2020-12-11 | Disposition: A | Discharge status: Home / Self Care | Payer: Medicare Other | Attending: Vascular Surgery | Admitting: Vascular Surgery

## 2020-12-11 ENCOUNTER — Other Ambulatory Visit: Payer: Self-pay

## 2020-12-11 ENCOUNTER — Other Ambulatory Visit (INDEPENDENT_AMBULATORY_CARE_PROVIDER_SITE_OTHER): Payer: Self-pay | Admitting: Nurse Practitioner

## 2020-12-11 ENCOUNTER — Encounter
Admission: RE | Disposition: A | Discharge status: Home / Self Care | Payer: Self-pay | Source: Home / Self Care | Attending: Vascular Surgery

## 2020-12-11 ENCOUNTER — Encounter: Payer: Self-pay | Admitting: Vascular Surgery

## 2020-12-11 DIAGNOSIS — Z86711 Personal history of pulmonary embolism: Secondary | ICD-10-CM | POA: Insufficient documentation

## 2020-12-11 DIAGNOSIS — M25561 Pain in right knee: Secondary | ICD-10-CM

## 2020-12-11 DIAGNOSIS — I82409 Acute embolism and thrombosis of unspecified deep veins of unspecified lower extremity: Secondary | ICD-10-CM

## 2020-12-11 DIAGNOSIS — Z86718 Personal history of other venous thrombosis and embolism: Secondary | ICD-10-CM | POA: Insufficient documentation

## 2020-12-11 HISTORY — PX: IVC FILTER INSERTION: CATH118245

## 2020-12-11 SURGERY — IVC FILTER INSERTION
Anesthesia: Moderate Sedation

## 2020-12-11 MED ORDER — HYDROMORPHONE HCL 1 MG/ML IJ SOLN: 1.0000 mg | Freq: Once | INTRAMUSCULAR | Status: DC | PRN

## 2020-12-11 MED ORDER — FENTANYL CITRATE (PF) 100 MCG/2ML IJ SOLN
INTRAMUSCULAR | Status: DC | PRN
  Administered 2020-12-11: 50 ug via INTRAVENOUS

## 2020-12-11 MED ORDER — HEPARIN SODIUM (PORCINE) 1000 UNIT/ML IJ SOLN
INTRAMUSCULAR | Status: AC
  Filled 2020-12-11: qty 1

## 2020-12-11 MED ORDER — FAMOTIDINE 20 MG PO TABS: 40.0000 mg | ORAL_TABLET | Freq: Once | ORAL | Status: DC | PRN

## 2020-12-11 MED ORDER — METHYLPREDNISOLONE SODIUM SUCC 125 MG IJ SOLR: 125.0000 mg | Freq: Once | INTRAMUSCULAR | Status: DC | PRN

## 2020-12-11 MED ORDER — SODIUM CHLORIDE 0.9 % IV SOLN
INTRAVENOUS | Status: DC
  Administered 2020-12-11: 75 mL via INTRAVENOUS

## 2020-12-11 MED ORDER — ONDANSETRON HCL 4 MG/2ML IJ SOLN: 4.0000 mg | Freq: Four times a day (QID) | INTRAMUSCULAR | Status: DC | PRN

## 2020-12-11 MED ORDER — DIPHENHYDRAMINE HCL 50 MG/ML IJ SOLN: 50.0000 mg | Freq: Once | INTRAMUSCULAR | Status: DC | PRN

## 2020-12-11 MED ORDER — MIDAZOLAM HCL 2 MG/ML PO SYRP: 8.0000 mg | ORAL_SOLUTION | Freq: Once | ORAL | Status: DC | PRN

## 2020-12-11 MED ORDER — MIDAZOLAM HCL 5 MG/5ML IJ SOLN
INTRAMUSCULAR | Status: AC
  Filled 2020-12-11: qty 5

## 2020-12-11 MED ORDER — FENTANYL CITRATE (PF) 100 MCG/2ML IJ SOLN
INTRAMUSCULAR | Status: AC
  Filled 2020-12-11: qty 2

## 2020-12-11 MED ORDER — CEFAZOLIN SODIUM-DEXTROSE 2-4 GM/100ML-% IV SOLN
2.0000 g | Freq: Once | INTRAVENOUS | Status: AC
  Administered 2020-12-11: 2 g via INTRAVENOUS

## 2020-12-11 MED ORDER — MIDAZOLAM HCL 2 MG/2ML IJ SOLN
INTRAMUSCULAR | Status: DC | PRN
  Administered 2020-12-11: 2 mg via INTRAVENOUS

## 2020-12-11 SURGICAL SUPPLY — 4 items
COVER PROBE U/S 5X48 (MISCELLANEOUS) ×2 IMPLANT
KIT FEMORAL DEL DENALI (Miscellaneous) ×2 IMPLANT
PACK ANGIOGRAPHY (CUSTOM PROCEDURE TRAY) ×2 IMPLANT
WIRE GUIDERIGHT .035X150 (WIRE) ×2 IMPLANT

## 2020-12-11 NOTE — Discharge Instructions (Signed)
Inferior Vena Cava Filter Insertion, Care After This sheet gives you information about how to care for yourself after your procedure. Your health care provider may also give you more specific instructions. If you have problems or questions, contact your health care provider. What can I expect after the procedure? After the procedure, it is common to have:  Mild pain in the area where the long, thin tube (catheter) used to deliver the filter was inserted.  Mild bruising in the area where the catheter used to deliver the filter was inserted. Follow these instructions at home: Insertion site care  Follow instructions from your health care provider about how to take care of your catheter insertion site. Make sure you: ? Wash your hands with soap and water for at least 20 seconds before and after you change your bandage (dressing). If soap and water are not available, use hand sanitizer. ? Change your dressing as told by your health care provider.  Keep the dressing and the insertion site clean and dry.  Check your insertion site every day for signs of infection. Check for: ? More redness, swelling, or pain. ? Fluid or blood. ? Warmth. ? Pus or a bad smell.  Do not take baths, swim, or use a hot tub until your health care provider approves. Ask your health care provider if you may take showers.   Activity  Avoid strenuous exercise or activities that take a lot of effort for 48 hours after the procedure or as told by your health care provider.  Do not go back to school or work until your health care provider approves.  Do not lift anything that is heavier than 5 lb (2.3 kg), or the limit that you are told, until your health care provider says that it is safe. Driving  If you were given a sedative during the procedure, it can affect you for several hours. Do not drive or operate machinery until your health care provider says that it is safe.  Ask your health care provider if the medicine  prescribed to you requires you to avoid driving or using heavy machinery. General instructions  Take over-the-counter and prescription medicines only as told by your health care provider.  Wear compression stockings as told by your health care provider. These stockings help to prevent blood clots and reduce swelling in your legs.  Keep all follow-up visits as told by your health care provider. This is important. Contact a health care provider if:  You have any of these signs of infection: ? More redness, swelling, or pain around your catheter insertion site. ? Fluid or blood coming from your insertion site. ? Warmth coming from your insertion site. ? Pus or a bad smell coming from your insertion site. ? A fever.  You are dizzy.  You have nausea and vomiting.  You develop a rash. Get help right away if:  You have chest pain, a cough, or difficulty breathing.  You have shortness of breath, feel faint, or pass out.  You cough up blood.  You have severe pain in your abdomen.  You develop swelling and discoloration or pain in your legs.  Your legs become pale and cold or blue.  You have weakness, difficulty moving your arms or legs, or balance problems.  You develop problems with speech or vision. These symptoms may represent a serious problem that is an emergency. Do not wait to see if the symptoms will go away. Get medical help right away. Call your local emergency   services (911 in the [place] Do not drive yourself to the hospital. Summary  After the procedure, it is common to have mild pain and bruising where a catheter was inserted at your neck or groin (catheter insertion site).  Do not shower, bathe, use a hot tub, or let the dressing get wet until your health care provider approves.  Every day, check for signs of infection at the catheter insertion site. This information is not intended to replace advice given to you by your health care provider. Make sure you discuss  any questions you have with your health care provider. Document Revised: 11/17/2019 Document Reviewed: 11/17/2019 Elsevier Patient Education  2021 Elsevier Inc.  

## 2020-12-11 NOTE — Op Note (Signed)
[facility]   OPERATIVE NOTE    PRE-OPERATIVE DIAGNOSIS: previous DVT and PE, upcoming knee replacement  POST-OPERATIVE DIAGNOSIS: same as above  PROCEDURE: 1.   Ultrasound guidance for vascular access to the right femoral vein 2.   Catheter placement into the inferior vena cava 3.   Inferior venacavogram 4.   Placement of a Bard Denali IVC filter  SURGEON: [name], MD  ASSISTANT(S): None  ANESTHESIA: local with Moderate Conscious Sedation for approximately 10 minutes using 2 mg of Versed and 50 mcg of Fentanyl  ESTIMATED BLOOD LOSS: minimal  CONTRAST: 15 cc  FLUORO TIME: less than one minute  FINDING(S): 1.  Patent IVC  SPECIMEN(S):  none  INDICATIONS:   Cindy Stein is a 65 y.o. female who presents with previous DVT and PE and upcoming knee replacement.  Inferior vena cava filter is indicated for this reason.  Risks and benefits including filter thrombosis, migration, fracture, bleeding, and infection were all discussed.  We discussed that all IVC filters that we place can be removed if desired from the patient once the need for the filter has passed.    DESCRIPTION: After obtaining full informed written consent, the patient was brought back to the vascular suite. The skin was sterilely prepped and draped in a sterile surgical field was created. Moderate conscious sedation was administered during a face to face encounter with the patient throughout the procedure with my supervision of the RN administering medicines and monitoring the patient's vital signs, pulse oximetry, telemetry and mental status throughout from the start of the procedure until the patient was taken to the recovery room. The right femoral vein was accessed under direct ultrasound guidance without difficulty with a Seldinger needle and a J-wire was then placed. After skin nick and dilatation, the delivery sheath was placed into the inferior vena cava and an inferior venacavogram  was performed. This demonstrated a patent IVC with the level of the renal veins at L1-L2.  The filter was then deployed into the inferior vena cava at the level of the L2-L3 interspace just below the renal veins. The delivery sheath was then removed. Pressure was held. Sterile dressings were placed. The patient tolerated the procedure well and was taken to the recovery room in stable condition.  COMPLICATIONS: None  CONDITION: Stable  [name]  12/11/2020, 10:23 AM   This note was created with Dragon Medical transcription system. Any errors in dictation are purely unintentional.

## 2020-12-13 NOTE — Telephone Encounter (Signed)
Error.  Atreyu Mak,cma  

## 2021-01-05 ENCOUNTER — Ambulatory Visit: Payer: Self-pay | Admitting: Urology

## 2021-01-08 ENCOUNTER — Other Ambulatory Visit: Payer: Self-pay | Admitting: Family Medicine

## 2021-01-09 ENCOUNTER — Other Ambulatory Visit: Payer: Self-pay | Admitting: Family Medicine

## 2021-01-12 ENCOUNTER — Telehealth: Payer: Self-pay

## 2021-01-12 NOTE — Telephone Encounter (Signed)
Patient stated she did not have her HCTZ and she wanted to know why or if it was a mistake, she stated se needs this medication.  Nina,cma

## 2021-01-12 NOTE — Telephone Encounter (Signed)
I called the patient and the pharmacy and the patient can pick up her medication when ready.  Taylor Levick,cma

## 2021-01-12 NOTE — Telephone Encounter (Signed)
Pt has questions about blood pressure medication. Please call on cell phone

## 2021-01-12 NOTE — Telephone Encounter (Signed)
She should have a refill of this at the pharmacy.  Has she contacted them yet?  She should continue on this medication.

## 2021-01-22 ENCOUNTER — Ambulatory Visit (INDEPENDENT_AMBULATORY_CARE_PROVIDER_SITE_OTHER): Payer: Medicare Other | Admitting: Pharmacist

## 2021-01-22 DIAGNOSIS — Z86711 Personal history of pulmonary embolism: Secondary | ICD-10-CM

## 2021-01-22 DIAGNOSIS — I10 Essential (primary) hypertension: Secondary | ICD-10-CM | POA: Diagnosis not present

## 2021-01-22 DIAGNOSIS — Z86718 Personal history of other venous thrombosis and embolism: Secondary | ICD-10-CM

## 2021-01-22 NOTE — Patient Instructions (Addendum)
Visit Information  PATIENT GOALS: Goals Addressed              This Visit's Progress   .  Medication Monitoring (pt-stated)        atient Goals/Self-Care Activities . Over the next 90 days, patient will:  - take medications as prescribed check blood pressure periodically, document, and provide at future appointments collaborate with provider on medication access solutions         Patient verbalizes understanding of instructions provided today and agrees to view in [place].   Telephone follow up appointment with care management team member scheduled for: ~8 weeks  Lorel Monaco, PharmD, BCPS PGY2 [place]

## 2021-01-22 NOTE — Chronic Care Management (AMB) (Addendum)
Chronic Care Management Pharmacy Note  01/22/2021 Name:  Margi Edmundson MRN:  [id number] DOB:  Nov 18, 1956  Subjective: Cindy Stein is an 65 y.o. year old female who is a primary patient of [name], [name], MD.  The CCM team was consulted for assistance with disease management and care coordination needs.    Engaged with patient by telephone for follow up visit in response to provider referral for pharmacy case management and/or care coordination services.   Consent to Services:  The patient was given information about Chronic Care Management services, agreed to services, and gave verbal consent prior to initiation of services.  Please see initial visit note for detailed documentation.   Objective:  Lab Results  Component Value Date   CREATININE 0.83 06/27/2020   CREATININE 0.72 05/02/2020   CREATININE 1.08 04/28/2020    Lab Results  Component Value Date   HGBA1C 6.1 11/30/2020    No results found for: CHOL, TRIG, HDL, CHOLHDL, VLDL, LDLCALC, LDLDIRECT  Clinical ASCVD: No  The ASCVD Risk score [name], et al., 2013) failed to calculate for the following reasons:   Cannot find a previous HDL lab   Cannot find a previous total cholesterol lab    BP Readings from Last 3 Encounters:  12/11/20 120/68  11/07/20 118/68  10/24/20 114/68    Assessment: Review of patient past medical history, allergies, medications, health status, including review of consultants reports, laboratory and other test data, was performed as part of comprehensive evaluation and provision of chronic care management services.   SDOH:  (Social Determinants of Health) assessments and interventions performed:    CCM Care Plan  Allergies  Allergen Reactions  . Hydrocodone   . Tramadol     Medications Reviewed Today    Reviewed by Avie Arenas, RPH (Pharmacist) on 01/22/21 at 1035  Med List Status: <None>  Medication Order Taking? Sig Documenting Provider Last Dose Status Informant   acetaminophen (TYLENOL) 325 MG tablet [id number] No Take 2 tablets (650 mg total) by mouth every 6 (six) hours as needed for mild pain (or Fever >/= 101).  Patient not taking: Reported on 01/22/2021   Charlynne Cousins, MD Not Taking Active   albuterol (VENTOLIN HFA) 108 (90 Base) MCG/ACT inhaler [id number] No Inhale 2 puffs into the lungs every 6 (six) hours as needed for wheezing or shortness of breath.  Patient not taking: Reported on 01/22/2021   Melvenia Needles, NP Not Taking Active   carvedilol (COREG) 3.125 MG tablet [id number] Yes TAKE 1 TABLET BY MOUTH TWICE A DAY Leone Haven, MD Taking Active   olmesartan-hydrochlorothiazide (BENICAR HCT) 20-12.5 MG tablet [id number] Yes Take 1 tablet by mouth daily. Leone Haven, MD Taking Active   oxyCODONE-acetaminophen (PERCOCET/ROXICET) 5-325 MG tablet [id number] No Take by mouth every 4 (four) hours as needed for severe pain.  Patient not taking: Reported on 01/22/2021   [provider] Not Taking Active            Med Note De Hollingshead   Wed Nov 08, 2020 11:42 AM) Taking every 6 hours  rivaroxaban (XARELTO) 20 MG TABS tablet [id number] Yes TAKE 1 TABLET BY MOUTH ONCE A DAY WTIH SUPPER Leone Haven, MD Taking Active           Patient Active Problem List   Diagnosis Date Noted  . History of cholecystectomy 10/24/2020  . History of decompression of median nerve 10/24/2020  . History of total knee arthroplasty 10/24/2020  .  Hypertensive disorder 10/24/2020  . Osteoarthritis of left knee 10/24/2020  . Dyspnea on exertion 08/29/2020  . Obesity 08/29/2020  . Palpitations 06/27/2020  . Pulmonary nodules 03/28/2020  . History of pulmonary embolus (PE) 03/21/2020  . History of DVT (deep vein thrombosis) 03/21/2020  . Hypertension   . PVC (premature ventricular contraction)     Conditions to be addressed/monitored: HTN, Hx DVT and PE, OA, Obesity  Care Plan : Medication Management  Updates made by Areli Jowett,  Myrka Sylva A, RPH since 01/22/2021 12:00 AM    Problem: Hypertension, Hx DVT and PE, OA, Obesity   Note:       Long-Range Goal: Disease Progression Prevention   This Visit's Progress: On track  Recent Progress: On track  Priority: High  Note:   Current Barriers:  . Unable to independently afford treatment regimen . Does not adhere to prescribed medication regimen  Pharmacist Clinical Goal(s):  Marland Kitchen Over the next 90 days, patient will verbalize ability to afford treatment regimen. . Over the next 90 days, patient will achieve adherence to monitoring guidelines and medication adherence to achieve therapeutic efficacy. through collaboration with PharmD and provider.   Interventions: . Inter-disciplinary care team collaboration (see longitudinal plan of care) . Comprehensive medication review performed; medication list updated in electronic medical record  Hypertension: . Controlled; current treatment: olmesartan 20/HCTZ 12.5 mg daily (notes that the pharmacy is dispensing the combined tablet to reduce costs (no prescription drug insurance); carvedilol 3.125 mg BID  . Current home readings: reports morning readings ~ 120/75-80 . Denies headaches and LE swelling . Exercise: PT 2x/week s/p knee replacement . Recommended to continue current regimen at this time. . Consider checking lipid panel with next lab work evaluate cardiovascular risk. No lipid panel on file.   Hx DVT, PE, unprovoked, previously s/p knee replacement . Appropriately managed. Current treatment: Xarelto 20 mg daily o Approved for Xarelto assistance through [employer] through 04/18/21. BIN [id number] Group [id number] ID [id number] . S/p IVC filter placed 12/11/20 and knee replacement 12/14/20 . Denies SOB, swelling, chest pain and palpitations . Exercise: PT 2x/week s/p knee replacement . Recommended to continue current regimen at this time.  . Will plan to collaborate with patient and providers to pursue reapplication for Xarelto assistance  in May   Patient Goals/Self-Care Activities . Over the next 90 days, patient will:  - take medications as prescribed check blood pressure periodically, document, and provide at future appointments collaborate with provider on medication access solutions  Follow Up Plan: Telephone follow up appointment with care management team member scheduled for: ~ 8 weeks       Medication Assistance: Xarelto obtained through [employer] medication assistance program. Renewal date 04/2021.   Follow Up:  Patient agrees to Care Plan and Follow-up.  Plan: Telephone follow up appointment with care management team member scheduled for:  ~8 weeks  Lorel Monaco, PharmD, BCPS PGY2 [place]   I agree with the documentation above and was present for the visit.   Catie Darnelle Maffucci, PharmD, [place], [place] Clinical Pharmacist [employer] [facility] [place]

## 2021-01-24 ENCOUNTER — Other Ambulatory Visit: Payer: Self-pay

## 2021-01-26 ENCOUNTER — Encounter: Payer: Self-pay | Admitting: Family Medicine

## 2021-01-26 ENCOUNTER — Ambulatory Visit (INDEPENDENT_AMBULATORY_CARE_PROVIDER_SITE_OTHER): Payer: Medicare Other | Admitting: Family Medicine

## 2021-01-26 ENCOUNTER — Other Ambulatory Visit: Payer: Self-pay

## 2021-01-26 DIAGNOSIS — Z86711 Personal history of pulmonary embolism: Secondary | ICD-10-CM | POA: Diagnosis not present

## 2021-01-26 DIAGNOSIS — I10 Essential (primary) hypertension: Secondary | ICD-10-CM | POA: Diagnosis not present

## 2021-01-26 DIAGNOSIS — R3129 Other microscopic hematuria: Secondary | ICD-10-CM | POA: Diagnosis not present

## 2021-01-26 DIAGNOSIS — R809 Proteinuria, unspecified: Secondary | ICD-10-CM | POA: Diagnosis not present

## 2021-01-26 DIAGNOSIS — Z96659 Presence of unspecified artificial knee joint: Secondary | ICD-10-CM

## 2021-01-26 DIAGNOSIS — R310 Gross hematuria: Secondary | ICD-10-CM | POA: Insufficient documentation

## 2021-01-26 LAB — POCT URINALYSIS DIP (MANUAL ENTRY)
Bilirubin, UA: NEGATIVE
Glucose, UA: NEGATIVE mg/dL
Ketones, POC UA: NEGATIVE mg/dL
Leukocytes, UA: NEGATIVE
Nitrite, UA: NEGATIVE
Protein Ur, POC: NEGATIVE mg/dL
Spec Grav, UA: 1.025 (ref 1.010–1.025)
Urobilinogen, UA: 1 E.U./dL
pH, UA: 5.5 (ref 5.0–8.0)

## 2021-01-26 NOTE — Assessment & Plan Note (Signed)
Noted on prior urine check.  She knows she needs to see urology for a cystoscopy to rule out an underlying lesion.  She has the phone number to call them to reschedule her appointment.

## 2021-01-26 NOTE — Assessment & Plan Note (Signed)
She will see orthopedics as planned.  Has been recovering well.

## 2021-01-26 NOTE — Patient Instructions (Signed)
Nice to see you. We will check your urine and contact you with results. Please call urology to schedule an appointment.

## 2021-01-26 NOTE — Assessment & Plan Note (Signed)
Well-controlled.  Continue Benicar HCT 1 tablet daily.

## 2021-01-26 NOTE — Progress Notes (Signed)
[name], MD Phone: [phone]  Cindy Stein is a 65 y.o. female who presents today for f/u.  HYPERTENSION  Disease Monitoring  Home BP Monitoring similar to today Chest pain- no    Dyspnea- no Medications  Compliance-  Taking benicar HCTZ.  Edema- no  History of PE: Currently has a IVC filter in place that should be coming out sometime in March.  Taking Xarelto daily.  No recurrence of PE.  Right knee replacement: Underwent knee replacement.  This went well.  She has been doing physical therapy with benefit.  She is not using a walker or cane.  Notes the swelling from the surgery has progressively improved.  Microscopic hematuria/proteinuria: Patient did not end up seeing urology yet.  She canceled an appointment with them recently.  She has the number to call to reschedule.  She has not noticed any issues.  Due for repeat urine for the proteinuria.  Social History   Tobacco Use  Smoking Status Never Smoker  Smokeless Tobacco Never Used    Current Outpatient Medications on File Prior to Visit  Medication Sig Dispense Refill  . acetaminophen (TYLENOL) 325 MG tablet Take 2 tablets (650 mg total) by mouth every 6 (six) hours as needed for mild pain (or Fever >/= 101).    Marland Kitchen albuterol (VENTOLIN HFA) 108 (90 Base) MCG/ACT inhaler Inhale 2 puffs into the lungs every 6 (six) hours as needed for wheezing or shortness of breath. 8 g 2  . carvedilol (COREG) 3.125 MG tablet TAKE 1 TABLET BY MOUTH TWICE A DAY 180 tablet 0  . olmesartan-hydrochlorothiazide (BENICAR HCT) 20-12.5 MG tablet Take 1 tablet by mouth daily. 90 tablet 1  . oxyCODONE-acetaminophen (PERCOCET/ROXICET) 5-325 MG tablet Take by mouth every 4 (four) hours as needed for severe pain.    . rivaroxaban (XARELTO) 20 MG TABS tablet TAKE 1 TABLET BY MOUTH ONCE A DAY WTIH SUPPER 30 tablet 2   No current facility-administered medications on file prior to visit.     ROS see history of present  illness  Objective  Physical Exam Vitals:   01/26/21 1508  BP: 118/80  Pulse: 79  Temp: 98 F (36.7 C)  SpO2: 99%    BP Readings from Last 3 Encounters:  01/26/21 118/80  12/11/20 120/68  11/07/20 118/68   Wt Readings from Last 3 Encounters:  01/26/21 207 lb (93.9 kg)  12/11/20 200 lb (90.7 kg)  11/28/20 209 lb (94.8 kg)    Physical Exam Constitutional:      General: She is not in acute distress.    Appearance: She is not diaphoretic.  Cardiovascular:     Rate and Rhythm: Normal rate and regular rhythm.     Heart sounds: Normal heart sounds.  Pulmonary:     Effort: Pulmonary effort is normal.     Breath sounds: Normal breath sounds.  Musculoskeletal:        General: No edema.     Right lower leg: No edema.     Left lower leg: No edema.  Skin:    General: Skin is warm and dry.  Neurological:     Mental Status: She is alert.      Assessment/Plan: Please see individual problem list.  Problem List Items Addressed This Visit    History of pulmonary embolus (PE)    Overall doing well.  She will continue on Xarelto.  She will continue to see vascular surgery for the IVC filter.      History of total knee  arthroplasty    She will see orthopedics as planned.  Has been recovering well.      Hypertension    Well-controlled.  Continue Benicar HCT 1 tablet daily.      Microscopic hematuria    Noted on prior urine check.  She knows she needs to see urology for a cystoscopy to rule out an underlying lesion.  She has the phone number to call them to reschedule her appointment.      Relevant Orders   POCT urinalysis dipstick (Completed)   Proteinuria    Recheck urinalysis.         This visit occurred during the SARS-CoV-2 public health emergency.  Safety protocols were in place, including screening questions prior to the visit, additional usage of staff PPE, and extensive cleaning of exam room while observing appropriate contact time as indicated for  disinfecting solutions.    [name], MD [place]

## 2021-01-26 NOTE — Assessment & Plan Note (Signed)
Overall doing well.  She will continue on Xarelto.  She will continue to see vascular surgery for the IVC filter.

## 2021-01-26 NOTE — Assessment & Plan Note (Signed)
Recheck urinalysis.

## 2021-01-27 LAB — URINALYSIS, MICROSCOPIC ONLY
Bacteria, UA: NONE SEEN /HPF
Hyaline Cast: NONE SEEN /LPF
WBC, UA: NONE SEEN /HPF (ref 0–5)

## 2021-02-13 ENCOUNTER — Other Ambulatory Visit: Payer: Self-pay | Admitting: Family Medicine

## 2021-02-13 DIAGNOSIS — I2699 Other pulmonary embolism without acute cor pulmonale: Secondary | ICD-10-CM

## 2021-02-13 DIAGNOSIS — I82402 Acute embolism and thrombosis of unspecified deep veins of left lower extremity: Secondary | ICD-10-CM

## 2021-03-19 ENCOUNTER — Ambulatory Visit (INDEPENDENT_AMBULATORY_CARE_PROVIDER_SITE_OTHER): Payer: Medicare Other | Admitting: Pharmacist

## 2021-03-19 DIAGNOSIS — Z86718 Personal history of other venous thrombosis and embolism: Secondary | ICD-10-CM

## 2021-03-19 DIAGNOSIS — I10 Essential (primary) hypertension: Secondary | ICD-10-CM

## 2021-03-19 DIAGNOSIS — Z86711 Personal history of pulmonary embolism: Secondary | ICD-10-CM

## 2021-03-19 NOTE — Chronic Care Management (AMB) (Addendum)
Chronic Care Management Pharmacy Note  03/19/2021 Name:  Cindy Stein MRN:  [id number] DOB:  1956-04-07  Subjective: Cindy Stein is an 65 y.o. year old female who is a primary patient of [name], [name], MD.  The CCM team was consulted for assistance with disease management and care coordination needs.    Engaged with patient by telephone for follow up visit in response to provider referral for pharmacy case management and/or care coordination services.   Consent to Services:  The patient was given information about Chronic Care Management services, agreed to services, and gave verbal consent prior to initiation of services.  Please see initial visit note for detailed documentation.   Patient Care Team: Leone Haven, MD as PCP - General (Family Medicine) Wellington Hampshire, MD as PCP - Cardiology (Cardiology) De Hollingshead, RPH-CPP as Pharmacist (Pharmacist)  Recent office visits: 2/25 w/Dr. [name] (PCP): recommended pt to reschedule appointment with urology regarding microscopic hematuria/proteinuria. No medication changes made at this visit.  Recent consult visits: None since last visit  Hospital visits: None in previous 6 months  Objective:  Lab Results  Component Value Date   CREATININE 0.83 06/27/2020   CREATININE 0.72 05/02/2020   CREATININE 1.08 04/28/2020    Lab Results  Component Value Date   HGBA1C 6.1 11/30/2020   Last diabetic Eye exam: No results found for: HMDIABEYEEXA  Last diabetic Foot exam: No results found for: HMDIABFOOTEX   No results found for: CHOL, TRIG, HDL, CHOLHDL, VLDL, LDLCALC, LDLDIRECT  Hepatic Function Latest Ref Rng & Units 06/27/2020 03/21/2020  Total Protein 6.0 - 8.3 g/dL 6.5 7.1  Albumin 3.5 - 5.2 g/dL 3.8 3.9  AST 0 - 37 U/L 16 24  ALT 0 - 35 U/L 23 30  Alk Phosphatase 39 - 117 U/L 71 78  Total Bilirubin 0.2 - 1.2 mg/dL 0.5 1.1    Lab Results  Component Value Date/Time   TSH 4.50 06/27/2020 08:55  AM   TSH 2.560 06/29/2019 09:43 AM    CBC Latest Ref Rng & Units 08/18/2020 06/27/2020 04/26/2020  WBC 4.0 - 10.5 K/uL 8.2 6.5 11.7(H)  Hemoglobin 12.0 - 15.0 g/dL 14.7 13.9 14.6  Hematocrit 36.0 - 46.0 % 42.0 41.3 44.0  Platelets 150 - 400 K/uL 224 209.0 271    No results found for: VD25OH  Clinical ASCVD: No  The ASCVD Risk score [name], et al., 2013) failed to calculate for the following reasons:   Cannot find a previous HDL lab   Cannot find a previous total cholesterol lab     Social History   Tobacco Use  Smoking Status Never Smoker  Smokeless Tobacco Never Used   BP Readings from Last 3 Encounters:  01/26/21 118/80  12/11/20 120/68  11/07/20 118/68   Pulse Readings from Last 3 Encounters:  01/26/21 79  12/11/20 (!) 58  11/07/20 68   Wt Readings from Last 3 Encounters:  01/26/21 207 lb (93.9 kg)  12/11/20 200 lb (90.7 kg)  11/28/20 209 lb (94.8 kg)    Assessment: Review of patient past medical history, allergies, medications, health status, including review of consultants reports, laboratory and other test data, was performed as part of comprehensive evaluation and provision of chronic care management services.   SDOH:  (Social Determinants of Health) assessments and interventions performed:  SDOH Interventions   Flowsheet Row Most Recent Value  SDOH Interventions   Financial Strain Interventions Other (Comment)  [manufacturer assistance]  CCM Care Plan  Allergies  Allergen Reactions  . Hydrocodone   . Tramadol     Medications Reviewed Today    Reviewed by Avie Arenas, RPH (Pharmacist) on 03/19/21 at 0907  Med List Status: <None>  Medication Order Taking? Sig Documenting Provider Last Dose Status Informant  acetaminophen (TYLENOL) 325 MG tablet [id number] No Take 2 tablets (650 mg total) by mouth every 6 (six) hours as needed for mild pain (or Fever >/= 101).  Patient not taking: Reported on 03/19/2021   Charlynne Cousins, MD Not Taking  Active   albuterol (VENTOLIN HFA) 108 (90 Base) MCG/ACT inhaler [id number] No Inhale 2 puffs into the lungs every 6 (six) hours as needed for wheezing or shortness of breath.  Patient not taking: Reported on 03/19/2021   Melvenia Needles, NP Not Taking Active   carvedilol (COREG) 3.125 MG tablet [id number] Yes TAKE 1 TABLET BY MOUTH TWICE A DAY Leone Haven, MD Taking Active   olmesartan-hydrochlorothiazide (BENICAR HCT) 20-12.5 MG tablet [id number] Yes Take 1 tablet by mouth daily. Leone Haven, MD Taking Active   oxyCODONE-acetaminophen (PERCOCET/ROXICET) 5-325 MG tablet [id number] Yes Take by mouth every 4 (four) hours as needed for severe pain. [provider] Taking Active            Med Note Kelby Aline Nov 08, 2020 11:42 AM) Taking every 6 hours  XARELTO 20 MG TABS tablet [id number] Yes TAKE 1 TABLET BY MOUTH ONCE A DAY WITH SUPPER Leone Haven, MD Taking Active           Patient Active Problem List   Diagnosis Date Noted  . Proteinuria 01/26/2021  . Microscopic hematuria 01/26/2021  . History of cholecystectomy 10/24/2020  . History of decompression of median nerve 10/24/2020  . History of total knee arthroplasty 10/24/2020  . Hypertensive disorder 10/24/2020  . Osteoarthritis of left knee 10/24/2020  . Dyspnea on exertion 08/29/2020  . Obesity 08/29/2020  . Palpitations 06/27/2020  . Pulmonary nodules 03/28/2020  . History of pulmonary embolus (PE) 03/21/2020  . History of DVT (deep vein thrombosis) 03/21/2020  . Hypertension   . PVC (premature ventricular contraction)     Immunization History  Administered Date(s) Administered  . Fluad Quad(high Dose 65+) 09/04/2020    Conditions to be addressed/monitored: HTN, hx of DVT and PE, OA, obesity  Care Plan : Medication Management  Updates made by Maddilyn Campus A, RPH since 03/19/2021 12:00 AM    Problem: Hypertension, Hx DVT and PE, OA, Obesity   Note:       Long-Range Goal: Disease  Progression Prevention   This Visit's Progress: On track  Recent Progress: On track  Priority: High  Note:   Current Barriers:  . Unable to independently afford treatment regimen . Does not adhere to prescribed medication regimen  Pharmacist Clinical Goal(s):  Marland Kitchen Over the next 90 days, patient will verbalize ability to afford treatment regimen. . Over the next 90 days, patient will achieve adherence to monitoring guidelines and medication adherence to achieve therapeutic efficacy. through collaboration with PharmD and provider.   Interventions: . Inter-disciplinary care team collaboration (see longitudinal plan of care) . Comprehensive medication review performed; medication list updated in electronic medical record  Health Maintenance:  Previously encouraged COVID vaccination. Patient not interested at that time. Will continue to encourage.   Hypertension: . Controlled per patient; current treatment: olmesartan 20/HCTZ 12.5 mg daily, carvedilol 3.125 mg BID  . Exercise: completed physical  therapy s/p right knee replacement . Recommended to continue current regimen at this time. . Consider checking lipid panel with next lab work evaluate cardiovascular risk. No lipid panel on file.   Hx DVT, PE, unprovoked, previously s/p knee replacement . Appropriately managed. Current treatment: Xarelto 20 mg daily o Approved for Xarelto assistance through [employer] through 04/18/21. BIN [id number] Group [id number] ID [id number] o Upcoming appointment with vascular surgery on 04/03/21 to determine continuation of Xarelto.  . S/p IVC filter placed 12/11/20 and knee replacement 12/14/20 . Exercise: completed physical therapy s/p right knee replacement . Pt now with [facility] Medicare Part D insurance coverage. So, if continuing Xarelto, informed pt she will need to meet out of pocket cost prior to reapplying for Xarelto assistance in May. Patient verbalized understanding and will let us know vascular's decision regarding  continuation of Xarelto. . Recommended to continue current regimen at this time.   Patient Goals/Self-Care Activities . Over the next 90 days, patient will:  - take medications as prescribed check blood pressure periodically, document, and provide at future appointments collaborate with provider on medication access solutions  Follow Up Plan: Telephone follow up appointment with care management team member scheduled for: ~ 12 weeks       Medication Assistance: Xarelto obtained through [employer] medication assistance program. Renewal date 04/2021.   Patient's preferred pharmacy is:  [facility], [address] [place] [address] Phone: [phone] Fax: [phone]  Follow Up:  Patient agrees to Care Plan and Follow-up.  Plan: Telephone follow up appointment with care management team member scheduled for:  ~12 weeks  Lorel Monaco, PharmD, BCPS PGY2 [place]   I was present for this visit and agree with the documentation by the resident as above.   Catie Darnelle Maffucci, PharmD, [place], [place] Clinical Pharmacist [employer] [facility] [place]

## 2021-03-19 NOTE — Patient Instructions (Signed)
  Visit Information  PATIENT GOALS: Goals Addressed              This Visit's Progress   .  Medication Monitoring (pt-stated)        Patient Goals/Self-Care Activities . Over the next 90 days, patient will:  - take medications as prescribed check blood pressure periodically, document, and provide at future appointments collaborate with provider on medication access solutions.         Patient verbalizes understanding of instructions provided today and agrees to view in [place].   Plan: Telephone follow up appointment with care management team member scheduled for:  ~12 weeks  Lorel Monaco, PharmD, [place]

## 2021-03-30 ENCOUNTER — Other Ambulatory Visit (INDEPENDENT_AMBULATORY_CARE_PROVIDER_SITE_OTHER): Payer: Self-pay | Admitting: Vascular Surgery

## 2021-03-30 DIAGNOSIS — Z95828 Presence of other vascular implants and grafts: Secondary | ICD-10-CM

## 2021-03-30 DIAGNOSIS — I82532 Chronic embolism and thrombosis of left popliteal vein: Secondary | ICD-10-CM

## 2021-04-03 ENCOUNTER — Other Ambulatory Visit: Payer: Self-pay

## 2021-04-03 ENCOUNTER — Ambulatory Visit (INDEPENDENT_AMBULATORY_CARE_PROVIDER_SITE_OTHER): Payer: Medicare Other

## 2021-04-03 ENCOUNTER — Ambulatory Visit (INDEPENDENT_AMBULATORY_CARE_PROVIDER_SITE_OTHER): Payer: Medicare Other | Admitting: Vascular Surgery

## 2021-04-03 ENCOUNTER — Encounter (INDEPENDENT_AMBULATORY_CARE_PROVIDER_SITE_OTHER): Payer: Self-pay | Admitting: Vascular Surgery

## 2021-04-03 VITALS — BP 111/69 | HR 68 | Ht 63.0 in | Wt 201.0 lb

## 2021-04-03 DIAGNOSIS — Z86718 Personal history of other venous thrombosis and embolism: Secondary | ICD-10-CM

## 2021-04-03 DIAGNOSIS — Z95828 Presence of other vascular implants and grafts: Secondary | ICD-10-CM | POA: Insufficient documentation

## 2021-04-03 DIAGNOSIS — I82532 Chronic embolism and thrombosis of left popliteal vein: Secondary | ICD-10-CM | POA: Diagnosis not present

## 2021-04-03 DIAGNOSIS — I10 Essential (primary) hypertension: Secondary | ICD-10-CM | POA: Diagnosis not present

## 2021-04-03 DIAGNOSIS — Z86711 Personal history of pulmonary embolism: Secondary | ICD-10-CM | POA: Diagnosis not present

## 2021-04-03 NOTE — H&P (View-Only) (Signed)
MRN : [id number]  Cindy Stein is a 65 y.o. (1956/07/03) female who presents with chief complaint of  Chief Complaint  Patient presents with  . Follow-up    6 wk [facility] post IVC filter Le ven  DVT   .  History of Present Illness: Patient returns today in follow up of of her previous DVT and pulmonary embolus as well as her previous IVC filter placement.  She had her knee replacement about 2 months ago. The patient has rehabilitated from her knee surgery and is doing well.  Duplex today shows no evidence of DVT in either lower extremity.  She had an IVC filter placed due to her previous history of DVT and pulmonary embolus with her previous knee replacement a year ago.  Current Outpatient Medications  Medication Sig Dispense Refill  . carvedilol (COREG) 3.125 MG tablet TAKE 1 TABLET BY MOUTH TWICE A DAY 180 tablet 0  . olmesartan-hydrochlorothiazide (BENICAR HCT) 20-12.5 MG tablet Take 1 tablet by mouth daily. 90 tablet 1  . oxyCODONE (OXY IR/ROXICODONE) 5 MG immediate release tablet Take by mouth.    . oxyCODONE-acetaminophen (PERCOCET/ROXICET) 5-325 MG tablet Take by mouth every 4 (four) hours as needed for severe pain.    Marland Kitchen XARELTO 20 MG TABS tablet TAKE 1 TABLET BY MOUTH ONCE A DAY WITH SUPPER 30 tablet 2  . acetaminophen (TYLENOL) 325 MG tablet Take 2 tablets (650 mg total) by mouth every 6 (six) hours as needed for mild pain (or Fever >/= 101). (Patient not taking: No sig reported)    . albuterol (VENTOLIN HFA) 108 (90 Base) MCG/ACT inhaler Inhale 2 puffs into the lungs every 6 (six) hours as needed for wheezing or shortness of breath. (Patient not taking: No sig reported) 8 g 2   No current facility-administered medications for this visit.    Past Medical History:  Diagnosis Date  . Arthritis of left ankle   . Arthritis of left knee   . Hypertension   . Plantar fascial fibromatosis   . Pulmonary embolus ([address]) 03/21/2020  . PVC (premature ventricular contraction)   .  Unspecified osteoarthritis, unspecified site   . Vitamin D deficiency     Past Surgical History:  Procedure Laterality Date  . CARDIAC CATHETERIZATION  2006  . carpel tunnel    . GALLBLADDER SURGERY    . IVC FILTER INSERTION N/A 12/11/2020   Procedure: IVC FILTER INSERTION;  Surgeon: Algernon Huxley, MD;  Location: [facility];  Service: Cardiovascular;  Laterality: N/A;  . LEFT HEART CATH AND CORONARY ANGIOGRAPHY Left 06/14/2019   Procedure: LEFT HEART CATH AND CORONARY ANGIOGRAPHY;  Surgeon: Wellington Hampshire, MD;  Location: [facility];  Service: Cardiovascular;  Laterality: Left;  . PULMONARY THROMBECTOMY Bilateral 03/22/2020   Procedure: PULMONARY THROMBECTOMY / THROMBOLYSIS;  Surgeon: Algernon Huxley, MD;  Location: [facility];  Service: Cardiovascular;  Laterality: Bilateral;     Social History        Tobacco Use  . Smoking status: Never Smoker  . Smokeless tobacco: Never Used  Vaping Use  . Vaping Use: Never used  Substance Use Topics  . Alcohol use: Yes    Comment: occass  . Drug use: Never           Family History  Problem Relation Age of Onset  . Asthma Mother   . Asthma Father   . Heart failure Father   . Leukemia Sister   . Breast cancer Neg  Hx      No Known Allergies   REVIEW OF SYSTEMS (Negative unless checked)  Constitutional: []?Weight loss  []?Fever  []?Chills Cardiac: []?Chest pain   []?Chest pressure   []?Palpitations   []?Shortness of breath when laying flat   []?Shortness of breath at rest   []?Shortness of breath with exertion. Vascular:  []?Pain in legs with walking   []?Pain in legs at rest   []?Pain in legs when laying flat   []?Claudication   []?Pain in feet when walking  []?Pain in feet at rest  []?Pain in feet when laying flat   [x]?History of DVT   [x]?Phlebitis   [x]?Swelling in legs   []?Varicose veins   []?Non-healing ulcers Pulmonary:   []?Uses home oxygen   []?Productive cough   []?Hemoptysis    []?Wheeze  []?COPD   []?Asthma Neurologic:  []?Dizziness  []?Blackouts   []?Seizures   []?History of stroke   []?History of TIA  []?Aphasia   []?Temporary blindness   []?Dysphagia   []?Weakness or numbness in arms   []?Weakness or numbness in legs Musculoskeletal:  [x]?Arthritis   []?Joint swelling   [x]?Joint pain   []?Low back pain Hematologic:  []?Easy bruising  []?Easy bleeding   []?Hypercoagulable state   []?Anemic   Gastrointestinal:  []?Blood in stool   []?Vomiting blood  []?Gastroesophageal reflux/heartburn   []?Abdominal pain Genitourinary:  []?Chronic kidney disease   []?Difficult urination  []?Frequent urination  []?Burning with urination   []?Hematuria Skin:  []?Rashes   []?Ulcers   []?Wounds Psychological:  []?History of anxiety   []? History of major depression.    Physical Examination  BP 111/69   Pulse 68   Ht 5' 3" (1.6 m)   Wt 201 lb (91.2 kg)   BMI 35.61 kg/m  Gen:  WD/WN, NAD Head: [place]/AT, No temporalis wasting. Ear/Nose/Throat: Hearing grossly intact, nares w/o erythema or drainage Eyes: Conjunctiva clear. Sclera non-icteric Neck: Supple.  Trachea midline Pulmonary:  Good air movement, no use of accessory muscles.  Cardiac: RRR, no JVD Vascular:  Vessel Right Left  Radial Palpable Palpable               Musculoskeletal: M/S 5/5 throughout.  No deformity or atrophy.  Mild lower extremity edema. Neurologic: Sensation grossly intact in extremities.  Symmetrical.  Speech is fluent.  Psychiatric: Judgment intact, Mood & affect appropriate for pt's clinical situation. Dermatologic: No rashes or ulcers noted.  No cellulitis or open wounds.       Labs Recent Results (from the past 2160 hour(s))  POCT urinalysis dipstick     Status: Abnormal   Collection Time: 01/26/21  3:42 PM  Result Value Ref Range   Color, UA yellow yellow   Clarity, UA clear clear   Glucose, UA negative negative mg/dL   Bilirubin, UA negative negative   Ketones, POC UA negative  negative mg/dL   Spec Grav, UA 1.025 1.010 - 1.025   Blood, UA small (A) negative   pH, UA 5.5 5.0 - 8.0   Protein Ur, POC negative negative mg/dL   Urobilinogen, UA 1.0 0.2 or 1.0 E.U./dL   Nitrite, UA Negative Negative   Leukocytes, UA Negative Negative  Urine Microscopic     Status: Abnormal   Collection Time: 01/26/21  4:10 PM  Result Value Ref Range   WBC, UA NONE SEEN 0 - 5 /HPF   RBC / HPF 0-2 0 - 2 /HPF   Squamous Epithelial / LPF 6-10 (A) < OR = 5 /  HPF   Bacteria, UA NONE SEEN NONE SEEN /HPF   Hyaline Cast NONE SEEN NONE SEEN /LPF    Radiology No results found.  Assessment/Plan Pulmonary embolus (HCC) The patient continues anticoagulation for pulmonary embolus and is now doing well.  Thrombectomy was done about 9 months ago with good results.    We are going to go to a prophylactic dose of 5 mg of Xarelto as she has completed appropriate therapy for her pulmonary embolus and DVT at this point.  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  Continue anticoagulation but will decrease the dose of Xarelto.  Hypertension blood pressure control important in reducing the progression of atherosclerotic disease. On appropriate oral medications.  S/P IVC filter The patient has rehabilitated from her knee surgery and is doing well.  Duplex today shows no evidence of DVT in either lower extremity.  She had an IVC filter placed due to her previous history of DVT and pulmonary embolus with her previous knee replacement a year ago.  At this point, her filter can be removed.  Risks and benefits were discussed.  She is agreeable to proceed    Lysha Schrade, MD  04/03/2021 2:23 PM    This note was created with Dragon medical transcription system.  Any errors from dictation are purely unintentional 

## 2021-04-03 NOTE — Progress Notes (Signed)
MRN : [id number]  Cindy Stein is a 65 y.o. (1956/07/03) female who presents with chief complaint of  Chief Complaint  Patient presents with  . Follow-up    6 wk [facility] post IVC filter [place]  DVT   .  History of Present Illness: Patient returns today in follow up of of her previous DVT and pulmonary embolus as well as her previous IVC filter placement.  She had her knee replacement about 2 months ago. The patient has rehabilitated from her knee surgery and is doing well.  Duplex today shows no evidence of DVT in either lower extremity.  She had an IVC filter placed due to her previous history of DVT and pulmonary embolus with her previous knee replacement a year ago.  Current Outpatient Medications  Medication Sig Dispense Refill  . carvedilol (COREG) 3.125 MG tablet TAKE 1 TABLET BY MOUTH TWICE A DAY 180 tablet 0  . olmesartan-hydrochlorothiazide (BENICAR HCT) 20-12.5 MG tablet Take 1 tablet by mouth daily. 90 tablet 1  . oxyCODONE (OXY IR/ROXICODONE) 5 MG immediate release tablet Take by mouth.    . oxyCODONE-acetaminophen (PERCOCET/ROXICET) 5-325 MG tablet Take by mouth every 4 (four) hours as needed for severe pain.    Marland Kitchen XARELTO 20 MG TABS tablet TAKE 1 TABLET BY MOUTH ONCE A DAY WITH SUPPER 30 tablet 2  . acetaminophen (TYLENOL) 325 MG tablet Take 2 tablets (650 mg total) by mouth every 6 (six) hours as needed for mild pain (or Fever >/= 101). (Patient not taking: No sig reported)    . albuterol (VENTOLIN HFA) 108 (90 Base) MCG/ACT inhaler Inhale 2 puffs into the lungs every 6 (six) hours as needed for wheezing or shortness of breath. (Patient not taking: No sig reported) 8 g 2   No current facility-administered medications for this visit.    Past Medical History:  Diagnosis Date  . Arthritis of left ankle   . Arthritis of left knee   . Hypertension   . Plantar fascial fibromatosis   . Pulmonary embolus ([address]) 03/21/2020  . PVC (premature ventricular contraction)   .  Unspecified osteoarthritis, unspecified site   . Vitamin D deficiency     Past Surgical History:  Procedure Laterality Date  . CARDIAC CATHETERIZATION  2006  . carpel tunnel    . GALLBLADDER SURGERY    . IVC FILTER INSERTION N/A 12/11/2020   Procedure: IVC FILTER INSERTION;  Surgeon: Algernon Huxley, MD;  Location: [facility];  Service: Cardiovascular;  Laterality: N/A;  . LEFT HEART CATH AND CORONARY ANGIOGRAPHY Left 06/14/2019   Procedure: LEFT HEART CATH AND CORONARY ANGIOGRAPHY;  Surgeon: Wellington Hampshire, MD;  Location: [facility];  Service: Cardiovascular;  Laterality: Left;  . PULMONARY THROMBECTOMY Bilateral 03/22/2020   Procedure: PULMONARY THROMBECTOMY / THROMBOLYSIS;  Surgeon: Algernon Huxley, MD;  Location: [facility];  Service: Cardiovascular;  Laterality: Bilateral;     Social History        Tobacco Use  . Smoking status: Never Smoker  . Smokeless tobacco: Never Used  Vaping Use  . Vaping Use: Never used  Substance Use Topics  . Alcohol use: Yes    Comment: occass  . Drug use: Never           Family History  Problem Relation Age of Onset  . Asthma Mother   . Asthma Father   . Heart failure Father   . Leukemia Sister   . Breast cancer Neg  Hx      No Known Allergies   REVIEW OF SYSTEMS (Negative unless checked)  Constitutional: [] ?Weight loss  [] ?Fever  [] ?Chills Cardiac: [] ?Chest pain   [] ?Chest pressure   [] ?Palpitations   [] ?Shortness of breath when laying flat   [] ?Shortness of breath at rest   [] ?Shortness of breath with exertion. Vascular:  [] ?Pain in legs with walking   [] ?Pain in legs at rest   [] ?Pain in legs when laying flat   [] ?Claudication   [] ?Pain in feet when walking  [] ?Pain in feet at rest  [] ?Pain in feet when laying flat   [x] ?History of DVT   [x] ?Phlebitis   [x] ?Swelling in legs   [] ?Varicose veins   [] ?Non-healing ulcers Pulmonary:   [] ?Uses home oxygen   [] ?Productive cough   [] ?Hemoptysis    [] ?Wheeze  [] ?COPD   [] ?Asthma Neurologic:  [] ?Dizziness  [] ?Blackouts   [] ?Seizures   [] ?History of stroke   [] ?History of TIA  [] ?Aphasia   [] ?Temporary blindness   [] ?Dysphagia   [] ?Weakness or numbness in arms   [] ?Weakness or numbness in legs Musculoskeletal:  [x] ?Arthritis   [] ?Joint swelling   [x] ?Joint pain   [] ?Low back pain Hematologic:  [] ?Easy bruising  [] ?Easy bleeding   [] ?Hypercoagulable state   [] ?Anemic   Gastrointestinal:  [] ?Blood in stool   [] ?Vomiting blood  [] ?Gastroesophageal reflux/heartburn   [] ?Abdominal pain Genitourinary:  [] ?Chronic kidney disease   [] ?Difficult urination  [] ?Frequent urination  [] ?Burning with urination   [] ?Hematuria Skin:  [] ?Rashes   [] ?Ulcers   [] ?Wounds Psychological:  [] ?History of anxiety   [] ? History of major depression.    Physical Examination  BP 111/69   Pulse 68   Ht 5\' 3"  (1.6 m)   Wt 201 lb (91.2 kg)   BMI 35.61 kg/m  Gen:  WD/WN, NAD Head: [place]/AT, No temporalis wasting. Ear/Nose/Throat: Hearing grossly intact, nares w/o erythema or drainage Eyes: Conjunctiva clear. Sclera non-icteric Neck: Supple.  Trachea midline Pulmonary:  Good air movement, no use of accessory muscles.  Cardiac: RRR, no JVD Vascular:  Vessel Right Left  Radial Palpable Palpable               Musculoskeletal: M/S 5/5 throughout.  No deformity or atrophy.  Mild lower extremity edema. Neurologic: Sensation grossly intact in extremities.  Symmetrical.  Speech is fluent.  Psychiatric: Judgment intact, Mood & affect appropriate for pt's clinical situation. Dermatologic: No rashes or ulcers noted.  No cellulitis or open wounds.       Labs Recent Results (from the past 2160 hour(s))  POCT urinalysis dipstick     Status: Abnormal   Collection Time: 01/26/21  3:42 PM  Result Value Ref Range   Color, UA yellow yellow   Clarity, UA clear clear   Glucose, UA negative negative mg/dL   Bilirubin, UA negative negative   Ketones, POC UA negative  negative mg/dL   Spec Grav, UA 1.025 1.010 - 1.025   Blood, UA small (A) negative   pH, UA 5.5 5.0 - 8.0   Protein Ur, POC negative negative mg/dL   Urobilinogen, UA 1.0 0.2 or 1.0 E.U./dL   Nitrite, UA Negative Negative   Leukocytes, UA Negative Negative  Urine Microscopic     Status: Abnormal   Collection Time: 01/26/21  4:10 PM  Result Value Ref Range   WBC, UA NONE SEEN 0 - 5 /HPF   RBC / HPF 0-2 0 - 2 /HPF   Squamous Epithelial / LPF 6-10 (A) < OR = 5 /  HPF   Bacteria, UA NONE SEEN NONE SEEN /HPF   Hyaline Cast NONE SEEN NONE SEEN /LPF    Radiology No results found.  Assessment/Plan Pulmonary embolus [facility]) The patient continues anticoagulation for pulmonary embolus and is now doing well.  Thrombectomy was done about 9 months ago with good results.    We are going to go to a prophylactic dose of 5 mg of Xarelto as she has completed appropriate therapy for her pulmonary embolus and DVT at this point.  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  Continue anticoagulation but will decrease the dose of Xarelto.  Hypertension blood pressure control important in reducing the progression of atherosclerotic disease. On appropriate oral medications.  S/P IVC filter The patient has rehabilitated from her knee surgery and is doing well.  Duplex today shows no evidence of DVT in either lower extremity.  She had an IVC filter placed due to her previous history of DVT and pulmonary embolus with her previous knee replacement a year ago.  At this point, her filter can be removed.  Risks and benefits were discussed.  She is agreeable to proceed    [name], MD  04/03/2021 2:23 PM    This note was created with Dragon medical transcription system.  Any errors from dictation are purely unintentional

## 2021-04-03 NOTE — Assessment & Plan Note (Signed)
The patient has rehabilitated from her knee surgery and is doing well.  Duplex today shows no evidence of DVT in either lower extremity.  She had an IVC filter placed due to her previous history of DVT and pulmonary embolus with her previous knee replacement a year ago.  At this point, her filter can be removed.  Risks and benefits were discussed.  She is agreeable to proceed

## 2021-04-03 NOTE — Patient Instructions (Signed)
Inferior Vena Cava Filter Removal  Inferior vena cava filter removal is a procedure to take out a metal filter that was placed into a large vein in the abdomen (inferior vena cava, or IVC). An IVC filter prevents blood clots in the legs or pelvis from traveling to the heart or lungs. Some IVC filters are designed to be removed (retrievable filters). You may have your filter removed when the danger of forming blood clots has passed or when you can take blood-thinning medicine to prevent blood clots. In some cases, the filter may need to be removed because it becomes damaged, is not working, or is causing problems. Most filters can be removed through the vein (percutaneous). In the rare cases when the health care provider is unable to remove the filter percutaneously, one of these steps may be taken:  A more invasive, open surgery.  The filter may be left in place. Tell a health care provider about:  Any allergies you have.  All medicines you are taking, including vitamins, herbs, eye drops, creams, and over-the-counter medicines.  Any problems you or family members have had with anesthetic medicines or with contrast dyes that are used during an imaging test.  Any blood disorders you have.  Any surgeries you have had.  Any medical conditions you have.  Whether you are pregnant or may be pregnant. What are the risks? Generally, this is a safe procedure. However, serious problems may occur, especially if the filter has been in for more than a few months. Possible problems include:  Infection.  Bleeding.  Allergic reactions to medicines or dyes.  Damage to the IVC, other blood vessels, or nearby structures.  A blood clot or a piece of the filter breaking loose and traveling to the heart or lungs. What happens before the procedure? Staying hydrated Follow instructions from your health care provider about hydration, which may include:  Up to 2 hours before the procedure - you may  continue to drink clear liquids, such as water, clear fruit juice, black coffee, and plain tea. Eating and drinking restrictions Follow instructions from your health care provider about eating and drinking, which may include:  8 hours before the procedure - stop eating heavy meals or foods, such as meat, fried foods, or fatty foods.  6 hours before the procedure - stop eating light meals or foods, such as toast or cereal.  6 hours before the procedure - stop drinking milk or drinks that contain milk.  2 hours before the procedure - stop drinking clear liquids. Medicines Ask your health care provider about:  Changing or stopping your regular medicines. This is especially important if you are taking diabetes medicines or blood thinners.  Taking medicines such as aspirin and ibuprofen. These medicines can thin your blood. Do not take these medicines unless your health care provider tells you to take them.  Taking over-the-counter medicines, vitamins, herbs, and supplements. General instructions  Do not use any products that contain nicotine or tobacco for at least 4 weeks before the procedure. These products include cigarettes, e-cigarettes, and chewing tobacco. If you need help quitting, ask your health care provider.  Ask your health care provider what steps will be taken to help prevent infection. These may include: ? Removing hair at the surgery site. ? Washing skin with a germ-killing soap.  Plan to have someone take you home from the hospital or clinic.  Plan to have a responsible adult care for you for at least 24 hours after you leave   the hospital or clinic. This is important. What happens during the procedure?  An IV will be inserted into one of your veins.  You will be given one or more of the following: ? A medicine to help you relax (sedative). ? A medicine to make you fall asleep (general anesthetic).  The procedure will be done through a vein in your groin or neck  that leads to the IVC. Your health care provider will inject a numbing medicine (local anesthetic) into the skin over the vein that will be used.  A small incision will be made over the vein.  A long, thin tube (catheter) will be inserted into the vein.  The catheter will be moved through your vein and into your IVC. X-rays may be done to help guide the catheter into place. Dye may be injected through the catheter before the X-rays to make the catheter and filter easier to see.  When the catheter reaches the filter, a hook (snare) on the end of the catheter may be used to latch on to the filter. In some cases, a grasping instrument (forceps) may be threaded through the catheter to gently grab and remove the filter instead.  After the filter has been hooked or grasped, the filter and instruments will be pulled out through the catheter.  The catheter will be removed through the insertion site in your skin.  Pressure will be placed over your insertion site until bleeding stops.  A bandage (dressing) will be placed over the catheter insertion site. The procedure may vary among health care providers and hospitals. What happens after the procedure?  Your blood pressure, heart rate, breathing rate, and blood oxygen level will be monitored until you leave the hospital or clinic.  You may need to stay in bed (be on bed rest) for a period of time.  If you were given a sedative during the procedure, it can affect you for several hours. Do not drive or operate machinery until your health care provider says that it is safe. Summary  Inferior vena cava (IVC) filter removal is a procedure to take out a filter that was placed to prevent blood clots from traveling to your heart or lungs.  You may have your filter removed when the danger of forming blood clots has passed or when you can take blood-thinning medicines to prevent blood clots. In some cases, a filter is removed because there is a problem with  it.  A long, thin tube (catheter) will be inserted through a vein in your neck or sometimes in your groin. Then, the filter will be gently grasped and pulled out through the catheter.  Plan to have a responsible adult care for you for at least 24 hours after you leave the hospital or clinic. This information is not intended to replace advice given to you by your health care provider. Make sure you discuss any questions you have with your health care provider. Document Revised: 12/24/2019 Document Reviewed: 12/24/2019 Elsevier Patient Education  2021 Elsevier Inc.  

## 2021-04-10 ENCOUNTER — Telehealth (INDEPENDENT_AMBULATORY_CARE_PROVIDER_SITE_OTHER): Payer: Self-pay

## 2021-04-10 NOTE — Telephone Encounter (Signed)
Spoke with the patient and she is schedule with Dr. Lucky Cowboy for a IVC filter removal on 04/19/21 at [facility]. Pre-procedure instructions were discussed and will be mailed.

## 2021-04-11 ENCOUNTER — Telehealth: Payer: Medicare Other

## 2021-04-18 ENCOUNTER — Other Ambulatory Visit (INDEPENDENT_AMBULATORY_CARE_PROVIDER_SITE_OTHER): Payer: Self-pay | Admitting: Nurse Practitioner

## 2021-04-18 ENCOUNTER — Telehealth: Payer: Self-pay | Admitting: Family Medicine

## 2021-04-18 ENCOUNTER — Other Ambulatory Visit: Payer: Self-pay | Admitting: Family Medicine

## 2021-04-18 MED ORDER — CARVEDILOL 3.125 MG PO TABS: 3.1250 mg | ORAL_TABLET | Freq: Two times a day (BID) | ORAL | 0 refills | Status: DC

## 2021-04-18 NOTE — Telephone Encounter (Signed)
Patient's pharmacy called and is requesting a prescription refill on her carvedilol (COREG) 3.125 MG tablet

## 2021-04-19 ENCOUNTER — Telehealth (INDEPENDENT_AMBULATORY_CARE_PROVIDER_SITE_OTHER): Payer: Self-pay | Admitting: Vascular Surgery

## 2021-04-19 ENCOUNTER — Ambulatory Visit
Admission: RE | Admit: 2021-04-19 | Discharge: 2021-04-19 | Disposition: A | Discharge status: Home / Self Care | Payer: Medicare Other | Attending: Vascular Surgery | Admitting: Vascular Surgery

## 2021-04-19 ENCOUNTER — Encounter
Admission: RE | Disposition: A | Discharge status: Home / Self Care | Payer: Self-pay | Source: Home / Self Care | Attending: Vascular Surgery

## 2021-04-19 ENCOUNTER — Encounter: Payer: Self-pay | Admitting: Vascular Surgery

## 2021-04-19 ENCOUNTER — Other Ambulatory Visit: Payer: Self-pay

## 2021-04-19 DIAGNOSIS — Z8249 Family history of ischemic heart disease and other diseases of the circulatory system: Secondary | ICD-10-CM | POA: Insufficient documentation

## 2021-04-19 DIAGNOSIS — Z4589 Encounter for adjustment and management of other implanted devices: Secondary | ICD-10-CM | POA: Insufficient documentation

## 2021-04-19 DIAGNOSIS — Z79899 Other long term (current) drug therapy: Secondary | ICD-10-CM | POA: Insufficient documentation

## 2021-04-19 DIAGNOSIS — Z7901 Long term (current) use of anticoagulants: Secondary | ICD-10-CM | POA: Insufficient documentation

## 2021-04-19 DIAGNOSIS — I2699 Other pulmonary embolism without acute cor pulmonale: Secondary | ICD-10-CM | POA: Insufficient documentation

## 2021-04-19 DIAGNOSIS — Z4689 Encounter for fitting and adjustment of other specified devices: Secondary | ICD-10-CM

## 2021-04-19 DIAGNOSIS — Z856 Personal history of leukemia: Secondary | ICD-10-CM | POA: Insufficient documentation

## 2021-04-19 DIAGNOSIS — I824Z2 Acute embolism and thrombosis of unspecified deep veins of left distal lower extremity: Secondary | ICD-10-CM | POA: Diagnosis not present

## 2021-04-19 DIAGNOSIS — Z96659 Presence of unspecified artificial knee joint: Secondary | ICD-10-CM | POA: Insufficient documentation

## 2021-04-19 DIAGNOSIS — Z86718 Personal history of other venous thrombosis and embolism: Secondary | ICD-10-CM

## 2021-04-19 DIAGNOSIS — I82409 Acute embolism and thrombosis of unspecified deep veins of unspecified lower extremity: Secondary | ICD-10-CM

## 2021-04-19 DIAGNOSIS — I10 Essential (primary) hypertension: Secondary | ICD-10-CM | POA: Insufficient documentation

## 2021-04-19 HISTORY — PX: IVC FILTER REMOVAL: CATH118246

## 2021-04-19 SURGERY — IVC FILTER REMOVAL
Anesthesia: Moderate Sedation

## 2021-04-19 MED ORDER — DIPHENHYDRAMINE HCL 50 MG/ML IJ SOLN: 50.0000 mg | Freq: Once | INTRAMUSCULAR | Status: DC | PRN

## 2021-04-19 MED ORDER — ONDANSETRON HCL 4 MG/2ML IJ SOLN: 4.0000 mg | Freq: Four times a day (QID) | INTRAMUSCULAR | Status: DC | PRN

## 2021-04-19 MED ORDER — FENTANYL CITRATE (PF) 100 MCG/2ML IJ SOLN
INTRAMUSCULAR | Status: DC | PRN
  Administered 2021-04-19: 50 ug via INTRAVENOUS

## 2021-04-19 MED ORDER — FAMOTIDINE 20 MG PO TABS: 40.0000 mg | ORAL_TABLET | Freq: Once | ORAL | Status: DC | PRN

## 2021-04-19 MED ORDER — METHYLPREDNISOLONE SODIUM SUCC 125 MG IJ SOLR: 125.0000 mg | Freq: Once | INTRAMUSCULAR | Status: DC | PRN

## 2021-04-19 MED ORDER — SODIUM CHLORIDE 0.9 % IV SOLN: INTRAVENOUS | Status: DC

## 2021-04-19 MED ORDER — MIDAZOLAM HCL 2 MG/ML PO SYRP: 8.0000 mg | ORAL_SOLUTION | Freq: Once | ORAL | Status: DC | PRN

## 2021-04-19 MED ORDER — FENTANYL CITRATE (PF) 100 MCG/2ML IJ SOLN: 12.5000 ug | Freq: Once | INTRAMUSCULAR | Status: DC | PRN

## 2021-04-19 MED ORDER — MIDAZOLAM HCL 2 MG/2ML IJ SOLN
INTRAMUSCULAR | Status: DC | PRN
  Administered 2021-04-19: 2 mg via INTRAVENOUS

## 2021-04-19 MED ORDER — FENTANYL CITRATE (PF) 100 MCG/2ML IJ SOLN
INTRAMUSCULAR | Status: AC
  Filled 2021-04-19: qty 2

## 2021-04-19 MED ORDER — MIDAZOLAM HCL 2 MG/2ML IJ SOLN
INTRAMUSCULAR | Status: AC
  Filled 2021-04-19: qty 2

## 2021-04-19 MED ORDER — CEFAZOLIN SODIUM-DEXTROSE 2-4 GM/100ML-% IV SOLN
INTRAVENOUS | Status: AC
  Administered 2021-04-19: 2 g via INTRAVENOUS
  Filled 2021-04-19: qty 100

## 2021-04-19 MED ORDER — CEFAZOLIN SODIUM-DEXTROSE 2-4 GM/100ML-% IV SOLN: 2.0000 g | Freq: Once | INTRAVENOUS | Status: AC

## 2021-04-19 SURGICAL SUPPLY — 4 items
COVER PROBE U/S 5X48 (MISCELLANEOUS) ×2 IMPLANT
PACK ANGIOGRAPHY (CUSTOM PROCEDURE TRAY) ×2 IMPLANT
SET VENACAVA FILTER RETRIEVAL (MISCELLANEOUS) ×2 IMPLANT
WIRE GUIDERIGHT .035X150 (WIRE) ×2 IMPLANT

## 2021-04-19 NOTE — Op Note (Signed)
[facility]   OPERATIVE NOTE    PRE-OPERATIVE DIAGNOSIS:  1. DVT 2. status post IVC filter placement  POST-OPERATIVE DIAGNOSIS: Same as above  PROCEDURE: 1. Ultrasound guidance for vascular access right jugular vein 2. Catheter placement into inferior vena cava from right jugular vein 3. Inferior venacavogram 4. Retrieval of [place] IVC filter  SURGEON: [name], MD  ASSISTANT(S): None  ANESTHESIA: Local with moderate conscious sedation for approximately 10 minutes using 2 mg of Versed and 50 mcg of Fentanyl  ESTIMATED BLOOD LOSS: 5 cc  CONTRAST:  15 cc  FLUORO TIME:  0.9 minutes  FINDING(S): 1. patent IVC, filter successfully retrieved  SPECIMEN(S): IVC filter  INDICATIONS:  Patient is a 65 y.o. female who presents with a previous history of IVC filter placement. Patient has had a recent major orthopedic surgery without event and did not have a DVT and no longer needs this filter. The patient remains on anticoagulation.  She previously had been treated for pulmonary embolus with pulmonary thrombectomy and anticoagulation as well.  Risks and benefits were discussed, and informed consent was obtained.  DESCRIPTION: After obtaining full informed written consent, the patient was brought back to the vascular suite and placed supine upon the table.Moderate conscious sedation was administered during a face to face encounter with the patient throughout the procedure with my supervision of the RN administering medicines and monitoring the patient's vital signs, pulse oximetry, telemetry and mental status throughout from the start of the procedure until the patient was taken to the recovery room.  After obtaining adequate anesthesia, the patient was prepped and draped in the standard fashion. The right jugular vein was visualized with ultrasound and found to be widely patent. It was then accessed under direct ultrasound guidance without  difficulty with the Seldinger needle and a permanent image was recorded. A J-wire was placed. After skin nick and dilatation, the retrieval sheath was placed over the wire and advanced into the inferior vena cava. Inferior vena cava was imaged and found to be widely patent on inferior venacavogram. The filter was relatively straight in its orientation. The retrieval snare was then placed through the sheath and the hook of the filter was snared without difficulty. The sheath was then advanced, and the filter was collapsed and brought into the sheath in its entirety. It was then removed from the body in its entirety. The retrieval sheath was then removed. Pressure was held at the access site and sterile dressing was placed. The patient was taken to the recovery room in stable condition having tolerated the procedure well.  COMPLICATIONS: None  CONDITION: Stable   [name] 04/19/2021 11:59 AM  This note was created with Dragon Medical transcription system. Any errors in dictation are purely unintentional.

## 2021-04-19 NOTE — Telephone Encounter (Signed)
Left patient vm for f/u appointment on 05/29/2021 at 10:30 am.

## 2021-04-19 NOTE — Interval H&P Note (Signed)
History and Physical Interval Note:  04/19/2021 11:24 AM  Cindy Stein  has presented today for surgery, with the diagnosis of IVC filter removal   DVT.  The various methods of treatment have been discussed with the patient and family. After consideration of risks, benefits and other options for treatment, the patient has consented to  Procedure(s): IVC FILTER REMOVAL (N/A) as a surgical intervention.  The patient's history has been reviewed, patient examined, no change in status, stable for surgery.  I have reviewed the patient's chart and labs.  Questions were answered to the patient's satisfaction.     [name]

## 2021-04-19 NOTE — Discharge Instructions (Signed)
Inferior Vena Cava Filter Removal, Care After This sheet gives you information about how to care for yourself after your procedure. Your health care provider may also give you more specific instructions. If you have problems or questions, contact your health care provider. What can I expect after the procedure? After your procedure, it is common to have: Mild pain in the area where the filter was removed. Mild bruising in the area where the filter was removed.  Follow these instructions at home: Removal  site care Follow instructions from your health care provider about how to take care of the site where a catheter was removed at your neck.. Make sure you: Wash your hands with soap and water before you change your bandage (dressing). If soap and water are not available, use hand sanitizer. Leave bandage on for 48hrs.. Check your removal site every day for signs of infection. Check for: More redness, swelling, or pain. More fluid or blood. Warmth. Pus or a bad smell. Keep the removal site clean and dry. You may shower tomorrow, do not scrub or rub over the dressing site. General instructions Take over-the-counter and prescription medicines only as told by your health care provider. Avoid heavy lifting or hard activities for 48 hours after the procedure or as told by your health care provider. Do not drive for 24 hours if you were given a a medicine to help you relax (sedative). Do not drive or use heavy machinery while taking prescription pain medicine. Do not go back to school or work until your health care provider approves. Keep all follow-up visits as told by your health care provider. This is important. Contact a health care provider if: You have more redness, swelling, or pain around your removal site. You have more fluid or blood coming from you removalr site. Your removal site feels warm to the touch. You have pus or a bad smell coming from your  removal site. You have a  fever. You are dizzy. You have nausea and vomiting. You develop a rash. Get help right away if: You develop chest pain, a cough, or difficulty breathing. You develop shortness of breath, feel faint, or pass out. You cough up blood. You have severe pain in your abdomen. You develop swelling and discoloration or pain in your legs. Your legs become pale and cold or blue. You develop weakness, difficulty moving your arms or legs, or balance problems. You develop problems with speech or vision. These symptoms may represent a serious problem that is an emergency. Do not wait to see if the symptoms will go away. Get medical help right away. Call your local emergency services (911 in the [place] Do not drive yourself to the hospital. Summary   

## 2021-05-29 ENCOUNTER — Ambulatory Visit (INDEPENDENT_AMBULATORY_CARE_PROVIDER_SITE_OTHER): Payer: Medicare Other | Admitting: Vascular Surgery

## 2021-05-29 ENCOUNTER — Other Ambulatory Visit: Payer: Self-pay

## 2021-05-29 VITALS — BP 104/66 | HR 65 | Ht 62.0 in | Wt 200.0 lb

## 2021-05-29 DIAGNOSIS — Z86718 Personal history of other venous thrombosis and embolism: Secondary | ICD-10-CM | POA: Diagnosis not present

## 2021-05-29 DIAGNOSIS — I10 Essential (primary) hypertension: Secondary | ICD-10-CM | POA: Diagnosis not present

## 2021-05-29 DIAGNOSIS — Z95828 Presence of other vascular implants and grafts: Secondary | ICD-10-CM

## 2021-05-29 DIAGNOSIS — Z86711 Personal history of pulmonary embolism: Secondary | ICD-10-CM

## 2021-05-29 NOTE — Assessment & Plan Note (Signed)
Filter was removed about 6 weeks ago and she is doing well.

## 2021-05-29 NOTE — Progress Notes (Signed)
MRN : [id number]  Cindy Stein is a 65 y.o. ([birth year]) female who presents with chief complaint of  Chief Complaint  Patient presents with   Follow-up    [facility] 6 wk FU ivc filter removal   .  History of Present Illness: Patient returns today in follow up.  She is doing well today.  She has having some shortness of breath with activity but overall is doing well.  She had her filter removed about a month ago without event.  She had no DVT and her cava was patent at that time.  Her access site is well-healed.  Current Outpatient Medications  Medication Sig Dispense Refill   acetaminophen (TYLENOL) 325 MG tablet Take 2 tablets (650 mg total) by mouth every 6 (six) hours as needed for mild pain (or Fever >/= 101).     albuterol (VENTOLIN HFA) 108 (90 Base) MCG/ACT inhaler Inhale 2 puffs into the lungs every 6 (six) hours as needed for wheezing or shortness of breath. 8 g 2   carvedilol (COREG) 3.125 MG tablet Take 1 tablet (3.125 mg total) by mouth 2 (two) times daily. 166 tablet 0   folic acid (FOLVITE) 1 MG tablet Take 1 mg by mouth daily.     methotrexate (RHEUMATREX) 2.5 MG tablet Take by mouth.     olmesartan-hydrochlorothiazide (BENICAR HCT) 20-12.5 MG tablet TAKE 1 TABLET BY MOUTH ONCE A DAY 90 tablet 1   oxyCODONE (OXY IR/ROXICODONE) 5 MG immediate release tablet Take by mouth.     oxyCODONE-acetaminophen (PERCOCET/ROXICET) 5-325 MG tablet Take by mouth every 4 (four) hours as needed for severe pain.     Turmeric 500 MG CAPS Take by mouth.     XARELTO 20 MG TABS tablet TAKE 1 TABLET BY MOUTH ONCE A DAY WITH SUPPER 30 tablet 2   No current facility-administered medications for this visit.    Past Medical History:  Diagnosis Date   Arthritis of left ankle    Arthritis of left knee    Hypertension    Plantar fascial fibromatosis    Pulmonary embolus ([place]) 03/21/2020   PVC (premature ventricular contraction)    Unspecified osteoarthritis, unspecified site    Vitamin D  deficiency     Past Surgical History:  Procedure Laterality Date   CARDIAC CATHETERIZATION  2006   carpel tunnel     GALLBLADDER SURGERY     IVC FILTER INSERTION N/A 12/11/2020   Procedure: IVC FILTER INSERTION;  Surgeon: Algernon Huxley, MD;  Location: [place] [facility];  Service: Cardiovascular;  Laterality: N/A;   IVC FILTER REMOVAL N/A 04/19/2021   Procedure: IVC FILTER REMOVAL;  Surgeon: Algernon Huxley, MD;  Location: [place] [facility];  Service: Cardiovascular;  Laterality: N/A;   LEFT HEART CATH AND CORONARY ANGIOGRAPHY Left 06/14/2019   Procedure: LEFT HEART CATH AND CORONARY ANGIOGRAPHY;  Surgeon: Wellington Hampshire, MD;  Location: [place] [facility];  Service: Cardiovascular;  Laterality: Left;   PULMONARY THROMBECTOMY Bilateral 03/22/2020   Procedure: PULMONARY THROMBECTOMY / THROMBOLYSIS;  Surgeon: Algernon Huxley, MD;  Location: [place] [facility];  Service: Cardiovascular;  Laterality: Bilateral;     Social History   Tobacco Use   Smoking status: Never   Smokeless tobacco: Never  Vaping Use   Vaping Use: Never used  Substance Use Topics   Alcohol use: Yes    Comment: occass   Drug use: Never       Family History  Problem Relation Age of  Onset   Asthma Mother    Asthma Father    Heart failure Father    Leukemia Sister    Breast cancer Neg Hx      Allergies  Allergen Reactions   Acetaminophen Other (See Comments)   Hydrocodone    Tramadol     REVIEW OF SYSTEMS (Negative unless checked)   Constitutional: [] Weight loss  [] Fever  [] Chills Cardiac: [] Chest pain   [] Chest pressure   [] Palpitations   [] Shortness of breath when laying flat   [] Shortness of breath at rest   [] Shortness of breath with exertion. Vascular:  [] Pain in legs with walking   [] Pain in legs at rest   [] Pain in legs when laying flat   [] Claudication   [] Pain in feet when walking  [] Pain in feet at rest  [] Pain in feet when laying flat   [x] History of DVT   [x] Phlebitis   [x] Swelling in  legs   [] Varicose veins   [] Non-healing ulcers Pulmonary:   [] Uses home oxygen   [] Productive cough   [] Hemoptysis   [] Wheeze  [] COPD   [] Asthma Neurologic:  [] Dizziness  [] Blackouts   [] Seizures   [] History of stroke   [] History of TIA  [] Aphasia   [] Temporary blindness   [] Dysphagia   [] Weakness or numbness in arms   [] Weakness or numbness in legs Musculoskeletal:  [x] Arthritis   [] Joint swelling   [x] Joint pain   [] Low back pain Hematologic:  [] Easy bruising  [] Easy bleeding   [] Hypercoagulable state   [] Anemic   Gastrointestinal:  [] Blood in stool   [] Vomiting blood  [] Gastroesophageal reflux/heartburn   [] Abdominal pain Genitourinary:  [] Chronic kidney disease   [] Difficult urination  [] Frequent urination  [] Burning with urination   [] Hematuria Skin:  [] Rashes   [] Ulcers   [] Wounds Psychological:  [] History of anxiety   []  History of major depression.  Physical Examination  BP 104/66   Pulse 65   Ht 5\' 2"  (1.575 m)   Wt 200 lb (90.7 kg)   BMI 36.58 kg/m  Gen:  WD/WN, NAD Head: [place]/AT, No temporalis wasting. Ear/Nose/Throat: Hearing grossly intact, nares w/o erythema or drainage Eyes: Conjunctiva clear. Sclera non-icteric Neck: Supple.  Trachea midline Pulmonary:  Good air movement, no use of accessory muscles.  Cardiac: RRR, no JVD Vascular:  Vessel Right Left  Radial Palpable Palpable                   Musculoskeletal: M/S 5/5 throughout.  No deformity or atrophy. No edema. Neurologic: Sensation grossly intact in extremities.  Symmetrical.  Speech is fluent.  Psychiatric: Judgment intact, Mood & affect appropriate for pt's clinical situation. Dermatologic: No rashes or ulcers noted.  No cellulitis or open wounds.     Labs No results found for this or any previous visit (from the past 2160 hour(s)).  Radiology No results found.  Assessment/Plan Pulmonary embolus [facility]) The patient continues anticoagulation for pulmonary embolus and is now doing well.  Thrombectomy  was done almost a year ago with good results.  Overall doing well.  Still has some occasional shortness of breath and has seen a pulmonary doctor in the past.  We discussed if her respiratory symptoms continue, another visit with the pulmonologist is probably reasonable.   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  c   Hypertension blood pressure control important in reducing the progression of atherosclerotic disease. On appropriate oral medications.  S/P IVC filter Filter was removed about 6 weeks ago and she is  doing well.    [name], MD  05/29/2021 11:17 AM    This note was created with Dragon medical transcription system.  Any errors from dictation are purely unintentional

## 2021-06-11 ENCOUNTER — Ambulatory Visit (INDEPENDENT_AMBULATORY_CARE_PROVIDER_SITE_OTHER): Payer: Medicare Other | Admitting: Pharmacist

## 2021-06-11 ENCOUNTER — Telehealth: Payer: Self-pay | Admitting: Pharmacist

## 2021-06-11 DIAGNOSIS — Z86718 Personal history of other venous thrombosis and embolism: Secondary | ICD-10-CM

## 2021-06-11 DIAGNOSIS — I10 Essential (primary) hypertension: Secondary | ICD-10-CM | POA: Diagnosis not present

## 2021-06-11 DIAGNOSIS — Z86711 Personal history of pulmonary embolism: Secondary | ICD-10-CM

## 2021-06-11 DIAGNOSIS — M069 Rheumatoid arthritis, unspecified: Secondary | ICD-10-CM

## 2021-06-11 MED ORDER — CARVEDILOL 3.125 MG PO TABS: 3.1250 mg | ORAL_TABLET | Freq: Two times a day (BID) | ORAL | 1 refills | Status: DC

## 2021-06-11 NOTE — Telephone Encounter (Signed)
  Chronic Care Management   Note  06/11/2021 Name: Cindy Stein MRN: [id number] DOB: [birth year]   Attempted to contact patient for scheduled appointment for medication management support. Left HIPAA compliant message for patient to return my call at their convenience.    Plan: - If I do not hear back from the patient by end of business today, will collaborate with Care Guide to outreach to schedule follow up with me   Catie Darnelle Maffucci, PharmD, [place], [place] Pharmacist [employer] at [employer] [phone]

## 2021-06-11 NOTE — Chronic Care Management (AMB) (Signed)
Chronic Care Management Pharmacy Note  06/11/2021 Name:  Cindy Stein MRN:  [id number] DOB:  1956-06-26   Subjective: Cindy Stein is an 65 y.o. year old female who is a primary patient of [name], [name], MD.  The CCM team was consulted for assistance with disease management and care coordination needs.    Engaged with patient by telephone for follow up visit in response to provider referral for pharmacy case management and/or care coordination services.   Consent to Services:  The patient was given information about Chronic Care Management services, agreed to services, and gave verbal consent prior to initiation of services.  Please see initial visit note for detailed documentation.   Patient Care Team: Leone Haven, MD as PCP - General (Family Medicine) Wellington Hampshire, MD as PCP - Cardiology (Cardiology) De Hollingshead, RPH-CPP as Pharmacist (Pharmacist)  Recent office visits: 5/3 - [facility] - documented to reduce dose Xarelto to ppx dosing 5/24 - rheumatology [facility] - prednisone taper for hand pain 6/9 - started MTX 15 mg weekly + folic acid 1 mg daily   Objective:  Lab Results  Component Value Date   CREATININE 0.83 06/27/2020   CREATININE 0.72 05/02/2020   CREATININE 1.08 04/28/2020    Lab Results  Component Value Date   HGBA1C 6.1 11/30/2020   Last diabetic Eye exam: No results found for: HMDIABEYEEXA  Last diabetic Foot exam: No results found for: HMDIABFOOTEX   No results found for: CHOL, TRIG, HDL, CHOLHDL, VLDL, LDLCALC, LDLDIRECT  Hepatic Function Latest Ref Rng & Units 06/27/2020 03/21/2020  Total Protein 6.0 - 8.3 g/dL 6.5 7.1  Albumin 3.5 - 5.2 g/dL 3.8 3.9  AST 0 - 37 U/L 16 24  ALT 0 - 35 U/L 23 30  Alk Phosphatase 39 - 117 U/L 71 78  Total Bilirubin 0.2 - 1.2 mg/dL 0.5 1.1    Lab Results  Component Value Date/Time   TSH 4.50 06/27/2020 08:55 AM   TSH 2.560 06/29/2019 09:43 AM    CBC Latest Ref Rng & Units  08/18/2020 06/27/2020 04/26/2020  WBC 4.0 - 10.5 K/uL 8.2 6.5 11.7(H)  Hemoglobin 12.0 - 15.0 g/dL 14.7 13.9 14.6  Hematocrit 36.0 - 46.0 % 42.0 41.3 44.0  Platelets 150 - 400 K/uL 224 209.0 271    No results found for: VD25OH  Clinical ASCVD: No  The ASCVD Risk score [name], et al., 2013) failed to calculate for the following reasons:   Cannot find a previous HDL lab   Cannot find a previous total cholesterol lab      Social History   Tobacco Use  Smoking Status Never  Smokeless Tobacco Never   BP Readings from Last 3 Encounters:  05/29/21 104/66  04/19/21 103/69  04/03/21 111/69   Pulse Readings from Last 3 Encounters:  05/29/21 65  04/19/21 63  04/03/21 68   Wt Readings from Last 3 Encounters:  05/29/21 200 lb (90.7 kg)  04/19/21 197 lb (89.4 kg)  04/03/21 201 lb (91.2 kg)    Assessment: Review of patient past medical history, allergies, medications, health status, including review of consultants reports, laboratory and other test data, was performed as part of comprehensive evaluation and provision of chronic care management services.   SDOH:  (Social Determinants of Health) assessments and interventions performed:  SDOH Interventions    Flowsheet Row Most Recent Value  SDOH Interventions   Financial Strain Interventions Intervention Not Indicated       CCM  Care Plan  Allergies  Allergen Reactions   Acetaminophen Other (See Comments)   Hydrocodone    Tramadol     Medications Reviewed Today     Reviewed by De Hollingshead, RPH-CPP (Pharmacist) on 06/11/21 at 1620  Med List Status: <None>   Medication Order Taking? Sig Documenting Provider Last Dose Status Informant  acetaminophen (TYLENOL) 325 MG tablet [id number] No Take 2 tablets (650 mg total) by mouth every 6 (six) hours as needed for mild pain (or Fever >/= 101).  Patient not taking: Reported on 06/11/2021   Charlynne Cousins, MD Not Taking Active   albuterol (VENTOLIN HFA) 108 (90  Base) MCG/ACT inhaler [id number] No Inhale 2 puffs into the lungs every 6 (six) hours as needed for wheezing or shortness of breath.  Patient not taking: Reported on 06/11/2021   Parrett, Fonnie Mu, NP Not Taking Active   carvedilol (COREG) 3.125 MG tablet [id number] Yes Take 1 tablet (3.125 mg total) by mouth 2 (two) times daily. Leone Haven, MD Taking Active   folic acid (FOLVITE) 1 MG tablet [id number] Yes Take 1 mg by mouth daily. [provider] Taking Active   methotrexate (RHEUMATREX) 2.5 MG tablet [id number] Yes Take 15 mg by mouth once a week. [provider] Taking Active   olmesartan-hydrochlorothiazide (BENICAR HCT) 20-12.5 MG tablet [id number] Yes TAKE 1 TABLET BY MOUTH ONCE A DAY Leone Haven, MD Taking Active   Turmeric 500 MG CAPS [id number] Yes Take by mouth. [provider] Taking Active   Patient not taking:  Discontinued 06/11/21 1620 (Completed Course)             Patient Active Problem List   Diagnosis Date Noted   S/P IVC filter 04/03/2021   Proteinuria 01/26/2021   Microscopic hematuria 01/26/2021   Pre-bariatric surgery nutrition evaluation 12/05/2020   History of cholecystectomy 10/24/2020   History of decompression of median nerve 10/24/2020   History of total knee arthroplasty 10/24/2020   Hypertensive disorder 10/24/2020   Osteoarthritis of left knee 10/24/2020   Dyspnea on exertion 08/29/2020   Obesity 08/29/2020   Palpitations 06/27/2020   Pulmonary nodules 03/28/2020   History of pulmonary embolus (PE) 03/21/2020   History of DVT (deep vein thrombosis) 03/21/2020   Hypertension    PVC (premature ventricular contraction)     Immunization History  Administered Date(s) Administered   Fluad Quad(high Dose 65+) 09/04/2020    Conditions to be addressed/monitored: HTN and rheumatoid arthritis  Care Plan : Medication Management  Updates made by De Hollingshead, RPH-CPP since 06/11/2021 12:00 AM     Problem:  Hypertension, Hx DVT and PE, OA, Obesity   Note:        Long-Range Goal: Disease Progression Prevention   This Visit's Progress: On track  Recent Progress: On track  Priority: High  Note:   Current Barriers:  Unable to independently afford treatment regimen Does not adhere to prescribed medication regimen  Pharmacist Clinical Goal(s):  Over the next 90 days, patient will verbalize ability to afford treatment regimen. Over the next 90 days, patient will achieve adherence to monitoring guidelines and medication adherence to achieve therapeutic efficacy. through collaboration with PharmD and provider.   Interventions: 1:1 collaboration with Leone Haven, MD regarding development and update of comprehensive plan of care as evidenced by provider attestation and co-signature Inter-disciplinary care team collaboration (see longitudinal plan of care) Comprehensive medication review performed; medication list updated in electronic medical record  Health Maintenance: Consider checking lipid  panel with next lab work. None on file  Hypertension: Controlled per last office visit; current treatment: olmesartan 20/HCTZ 12.5 mg daily, carvedilol 3.125 mg BID  Recommended to continue current regimen at this time.  Hx DVT, PE, unprovoked, previously s/p knee replacement Appropriately managed. Current treatment: Xarelto 20 mg daily- patient discontinued, reports Dr. Lucky Cowboy advised she could change to aspirin 81 mg daily. This is not apparent in documentation Contacted Dr. Lucky Cowboy to clarify if patient is to be on extended prophylaxis dosing of Xarelto or aspirin 81 mg. He noted that aspirin 81 mg daily therapy is appropriate. Continue current regimen at this time.  Rheumatoid Arthritis: Uncontrolled but managed; current regimen: methotrexate 15 mg once weekly, folic acid 1 mg daily; turmeric supplement; follows w/ Dr. Jefm Bryant rheumatology Confirmed appropriate dosing. Patient just recently started  therapy, aware of prolonged onset. Upcoming lab work.  Recommended to continue current regimen at this time along with collaboration with rheumatology.   Patient Goals/Self-Care Activities Over the next 90 days, patient will:  - take medications as prescribed check blood pressure periodically, document, and provide at future appointments collaborate with provider on medication access solutions  Follow Up Plan: Telephone follow up appointment with care management team member scheduled for: ~ 4 months        Medication Assistance: None required.  Patient affirms current coverage meets needs.  Patient's preferred pharmacy is:  [facility], [address] [place] [address] Phone: [phone] Fax: [phone]  Follow Up:  Patient agrees to Care Plan and Follow-up.  Plan: Telephone follow up appointment with care management team member scheduled for:  ~ 4 months  Catie Darnelle Maffucci, PharmD, [place], [place] Clinical Pharmacist [employer] at [employer] [phone]

## 2021-06-11 NOTE — Patient Instructions (Signed)
Yis[place],   Dr. Lucky Cowboy confirmed that you are OK to continue aspirin 81 mg daily. I have removed Xarelto from your medication list.   I scheduled a follow up call in about 4 months. As always, let me know if you have any questions or concerns in the meantime!  Take care,   Catie Darnelle Maffucci, PharmD [phone]   Visit Information  PATIENT GOALS:  Goals Addressed               This Visit's Progress     Patient Stated     Medication Monitoring (pt-stated)        Patient Goals/Self-Care Activities Over the next 90 days, patient will:  - take medications as prescribed check blood pressure periodically, document, and provide at future appointments collaborate with provider on medication access solutions.          The patient verbalized understanding of instructions, educational materials, and care plan provided today and agreed to receive a mailed copy of patient instructions, educational materials, and care plan.    Plan: Telephone follow up appointment with care management team member scheduled for:  ~ 4 months  Catie Darnelle Maffucci, PharmD, [place], [place] Clinical Pharmacist [employer] at [employer] [phone]

## 2021-06-11 NOTE — Telephone Encounter (Signed)
Patient returned call. See CCM documentation 

## 2021-06-13 ENCOUNTER — Other Ambulatory Visit: Payer: Self-pay

## 2021-06-13 ENCOUNTER — Ambulatory Visit: Payer: Medicare Other | Admitting: Obstetrics and Gynecology

## 2021-06-13 ENCOUNTER — Encounter: Payer: Self-pay | Admitting: Obstetrics and Gynecology

## 2021-06-13 VITALS — BP 137/82 | HR 86 | Ht 62.0 in | Wt 201.6 lb

## 2021-06-13 DIAGNOSIS — N813 Complete uterovaginal prolapse: Secondary | ICD-10-CM

## 2021-06-13 DIAGNOSIS — N814 Uterovaginal prolapse, unspecified: Secondary | ICD-10-CM

## 2021-06-13 NOTE — Progress Notes (Signed)
HPI:      Cindy Stein is a 65 y.o. G1P1001 who LMP was No LMP recorded. Patient is postmenopausal.  Subjective:   She presents today stating that suddenly 2 weeks ago her prolapse became significantly worse and was accompanied by pelvic pain.  She states that she is very uncomfortable regularly.  She cannot sit down.  She reports no changes in bowel or bladder habits.  She reports no bleeding. Of significant note since she was last here she has had multiple surgeries including bilateral knee replacements. In addition she developed a DVT with pulmonary embolus.  She has been on Eliquis but has now completed this course.    Hx: The following portions of the patient's history were reviewed and updated as appropriate:             She  has a past medical history of Arthritis of left ankle, Arthritis of left knee, Hypertension, Plantar fascial fibromatosis, Pulmonary embolus ([place]) (03/21/2020), PVC (premature ventricular contraction), Unspecified osteoarthritis, unspecified site, and Vitamin D deficiency. She does not have any pertinent problems on file. She  has a past surgical history that includes Gallbladder surgery; carpel tunnel; Cardiac catheterization (2006); LEFT HEART CATH AND CORONARY ANGIOGRAPHY (Left, 06/14/2019); PULMONARY THROMBECTOMY (Bilateral, 03/22/2020); IVC FILTER INSERTION (N/A, 12/11/2020); and IVC FILTER REMOVAL (N/A, 04/19/2021). Her family history includes Asthma in her father and mother; Heart failure in her father; Leukemia in her sister. She  reports that she has never smoked. She has never used smokeless tobacco. She reports current alcohol use. She reports that she does not use drugs. She has a current medication list which includes the following prescription(s): aspirin ec, carvedilol, folic acid, olmesartan-hydrochlorothiazide, turmeric, acetaminophen, albuterol, and methotrexate. She is allergic to acetaminophen, hydrocodone, and tramadol.       Review of Systems:   Review of Systems  Constitutional: Denied constitutional symptoms, night sweats, recent illness, fatigue, fever, insomnia and weight loss.  Eyes: Denied eye symptoms, eye pain, photophobia, vision change and visual disturbance.  Ears/Nose/Throat/Neck: Denied ear, nose, throat or neck symptoms, hearing loss, nasal discharge, sinus congestion and sore throat.  Cardiovascular: Denied cardiovascular symptoms, arrhythmia, chest pain/pressure, edema, exercise intolerance, orthopnea and palpitations.  Respiratory: Denied pulmonary symptoms, asthma, pleuritic pain, productive sputum, cough, dyspnea and wheezing.  Gastrointestinal: Denied, gastro-esophageal reflux, melena, nausea and vomiting.  Genitourinary: See HPI for additional information.  Musculoskeletal: Denied musculoskeletal symptoms, stiffness, swelling, muscle weakness and myalgia.  Dermatologic: Denied dermatology symptoms, rash and scar.  Neurologic: Denied neurology symptoms, dizziness, headache, neck pain and syncope.  Psychiatric: Denied psychiatric symptoms, anxiety and depression.  Endocrine: Denied endocrine symptoms including hot flashes and night sweats.   Meds:   Current Outpatient Medications on File Prior to Visit  Medication Sig Dispense Refill   aspirin EC 81 MG tablet Take 81 mg by mouth daily. Swallow whole.     carvedilol (COREG) 3.125 MG tablet Take 1 tablet (3.125 mg total) by mouth 2 (two) times daily. 588 tablet 1   folic acid (FOLVITE) 1 MG tablet Take 1 mg by mouth daily.     olmesartan-hydrochlorothiazide (BENICAR HCT) 20-12.5 MG tablet TAKE 1 TABLET BY MOUTH ONCE A DAY 90 tablet 1   Turmeric 500 MG CAPS Take by mouth.     acetaminophen (TYLENOL) 325 MG tablet Take 2 tablets (650 mg total) by mouth every 6 (six) hours as needed for mild pain (or Fever >/= 101). (Patient not taking: No sig reported)     albuterol (VENTOLIN HFA)  108 (90 Base) MCG/ACT inhaler Inhale 2 puffs into the lungs every 6 (six) hours as  needed for wheezing or shortness of breath. (Patient not taking: No sig reported) 8 g 2   methotrexate (RHEUMATREX) 2.5 MG tablet Take 15 mg by mouth once a week. (Patient not taking: Reported on 06/13/2021)     No current facility-administered medications on file prior to visit.          Objective:     Vitals:   06/13/21 0854  BP: 137/82  Pulse: 86   Filed Weights   06/13/21 0854  Weight: 201 lb 9.6 oz (91.4 kg)              Physical examination   Pelvic:   Vulva: Normal appearance.  No lesions.  Vagina: No lesions or abnormalities noted.  Large cystocele and rectocele  Support: Complete procidentia  Urethra No masses tenderness or scarring.  Meatus Normal size without lesions or prolapse.  Cervix: Normal appearance.  No lesions.  Anus: Normal exam.  No lesions.  Perineum: Normal exam.  No lesions.        Bimanual   Uterus: Normal size.  Non-tender.  Mobile.  AV.  See above   Adnexae: No masses.  Non-tender to palpation.  Cul-de-sac: Negative for abnormality.     Assessment:    G1P1001 Patient Active Problem List   Diagnosis Date Noted   S/P IVC filter 04/03/2021   Proteinuria 01/26/2021   Microscopic hematuria 01/26/2021   Pre-bariatric surgery nutrition evaluation 12/05/2020   History of cholecystectomy 10/24/2020   History of decompression of median nerve 10/24/2020   History of total knee arthroplasty 10/24/2020   Hypertensive disorder 10/24/2020   Osteoarthritis of left knee 10/24/2020   Dyspnea on exertion 08/29/2020   Obesity 08/29/2020   Palpitations 06/27/2020   Pulmonary nodules 03/28/2020   History of pulmonary embolus (PE) 03/21/2020   History of DVT (deep vein thrombosis) 03/21/2020   Hypertension    PVC (premature ventricular contraction)      1. 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 Complete uterine prolapse     She is very uncomfortable with this   Plan:            1.  We have discussed the option of pessary versus  surgery.  We have discussed the amount of surgery necessary to fix this and to make the vagina very small so recurrence could not occur.  Patient does not want a pessary.  Literature regarding pelvic prolapse surgical options and pessary options given.  2.  Patient to obtain medical clearance for surgery especially due to recent PE from her primary care physician.  3.  Would plan at least 3 weeks of vaginal estrogen treatment prior to surgery   Likely-TVH A&P repair TOT perineoplasty -with a goal of making the vagina very small. Orders No orders of the defined types were placed in this encounter.   No orders of the defined types were placed in this encounter.     F/U  Return in about 3 weeks (around 07/04/2021). I spent 31 minutes involved in the care of this patient preparing to see the patient by obtaining and reviewing her medical history (including labs, imaging tests and prior procedures), documenting clinical information in the electronic health record (EHR), counseling and coordinating care plans, writing and sending prescriptions, ordering tests or procedures and directly communicating with the patient by discussing pertinent items from her history and physical exam as well as  detailing my assessment and plan as noted above so that she has an informed understanding.  All of her questions were answered.  Finis Bud, M.D. 06/13/2021 9:48 AM

## 2021-06-15 ENCOUNTER — Ambulatory Visit (INDEPENDENT_AMBULATORY_CARE_PROVIDER_SITE_OTHER): Payer: Medicare Other

## 2021-06-15 VITALS — Ht 62.0 in | Wt 201.0 lb

## 2021-06-15 DIAGNOSIS — Z0000 Encounter for general adult medical examination without abnormal findings: Secondary | ICD-10-CM | POA: Diagnosis not present

## 2021-06-15 NOTE — Patient Instructions (Addendum)
Cindy Stein , Thank you for taking time to come for your Medicare Wellness Visit. I appreciate your ongoing commitment to your health goals. Please review the following plan we discussed and let me know if I can assist you in the future.   These are the goals we discussed:  Goals       Patient Stated     Medication Monitoring (pt-stated)      Patient Goals/Self-Care Activities Over the next 90 days, patient will:  - take medications as prescribed check blood pressure periodically, document, and provide at future appointments collaborate with provider on medication access solutions.       Other     Healthy lifestyle      Healthy diet Stay active        This is a list of the screening recommended for you and due dates:  Health Maintenance  Topic Date Due   DEXA scan (bone density measurement)  06/15/2022*   Colon Cancer Screening  06/15/2022*   Flu Shot  07/02/2021   Mammogram  04/06/2022   HPV Vaccine  Aged Out   Tetanus Vaccine  Discontinued   COVID-19 Vaccine  Discontinued   Hepatitis C Screening: USPSTF Recommendation to screen - Ages 18-79 yo.  Discontinued   Pneumonia vaccines  Discontinued   Zoster (Shingles) Vaccine  Discontinued  *Topic was postponed. The date shown is not the original due date.   Advanced directives: End of life planning; Advance aging; Advanced directives discussed.  Copy of current HCPOA/Living Will requested.    Conditions/risks identified: none new  Follow up in one year for your annual wellness visit    Preventive Care 65 Years and Older, Female Preventive care refers to lifestyle choices and visits with your health care provider that can promote health and wellness. What does preventive care include? A yearly physical exam. This is also called an annual well check. Dental exams once or twice a year. Routine eye exams. Ask your health care provider how often you should have your eyes checked. Personal lifestyle choices,  including: Daily care of your teeth and gums. Regular physical activity. Eating a healthy diet. Avoiding tobacco and drug use. Limiting alcohol use. Practicing safe sex. Taking low-dose aspirin every day. Taking vitamin and mineral supplements as recommended by your health care provider. What happens during an annual well check? The services and screenings done by your health care provider during your annual well check will depend on your age, overall health, lifestyle risk factors, and family history of disease. Counseling  Your health care provider may ask you questions about your: Alcohol use. Tobacco use. Drug use. Emotional well-being. Home and relationship well-being. Sexual activity. Eating habits. History of falls. Memory and ability to understand (cognition). Work and work Statistician. Reproductive health. Screening  You may have the following tests or measurements: Height, weight, and BMI. Blood pressure. Lipid and cholesterol levels. These may be checked every 5 years, or more frequently if you are over 65 years old. Skin check. Lung cancer screening. You may have this screening every year starting at age 110 if you have a 30-pack-year history of smoking and currently smoke or have quit within the past 15 years. Fecal occult blood test (FOBT) of the stool. You may have this test every year starting at age 14. Flexible sigmoidoscopy or colonoscopy. You may have a sigmoidoscopy every 5 years or a colonoscopy every 10 years starting at age 16. Hepatitis C blood test. Hepatitis B blood test. Sexually transmitted disease (  STD) testing. Diabetes screening. This is done by checking your blood sugar (glucose) after you have not eaten for a while (fasting). You may have this done every 1-3 years. Bone density scan. This is done to screen for osteoporosis. You may have this done starting at age 32. Mammogram. This may be done every 1-2 years. Talk to your health care provider  about how often you should have regular mammograms. Talk with your health care provider about your test results, treatment options, and if necessary, the need for more tests. Vaccines  Your health care provider may recommend certain vaccines, such as: Influenza vaccine. This is recommended every year. Tetanus, diphtheria, and acellular pertussis (Tdap, Td) vaccine. You may need a Td booster every 10 years. Zoster vaccine. You may need this after age 57. Pneumococcal 13-valent conjugate (PCV13) vaccine. One dose is recommended after age 87. Pneumococcal polysaccharide (PPSV23) vaccine. One dose is recommended after age 54. Talk to your health care provider about which screenings and vaccines you need and how often you need them. This information is not intended to replace advice given to you by your health care provider. Make sure you discuss any questions you have with your health care provider. Document Released: 12/15/2015 Document Revised: 08/07/2016 Document Reviewed: 09/19/2015 Elsevier Interactive Patient Education  2017 [place] Prevention in the Home Falls can cause injuries. They can happen to people of all ages. There are many things you can do to make your home safe and to help prevent falls. What can I do on the outside of my home? Regularly fix the edges of walkways and driveways and fix any cracks. Remove anything that might make you trip as you walk through a door, such as a raised step or threshold. Trim any bushes or trees on the path to your home. Use bright outdoor lighting. Clear any walking paths of anything that might make someone trip, such as rocks or tools. Regularly check to see if handrails are loose or broken. Make sure that both sides of any steps have handrails. Any raised decks and porches should have guardrails on the edges. Have any leaves, snow, or ice cleared regularly. Use sand or salt on walking paths during winter. Clean up any spills in  your garage right away. This includes oil or grease spills. What can I do in the bathroom? Use night lights. Install grab bars by the toilet and in the tub and shower. Do not use towel bars as grab bars. Use non-skid mats or decals in the tub or shower. If you need to sit down in the shower, use a plastic, non-slip stool. Keep the floor dry. Clean up any water that spills on the floor as soon as it happens. Remove soap buildup in the tub or shower regularly. Attach bath mats securely with double-sided non-slip rug tape. Do not have throw rugs and other things on the floor that can make you trip. What can I do in the bedroom? Use night lights. Make sure that you have a light by your bed that is easy to reach. Do not use any sheets or blankets that are too big for your bed. They should not hang down onto the floor. Have a firm chair that has side arms. You can use this for support while you get dressed. Do not have throw rugs and other things on the floor that can make you trip. What can I do in the kitchen? Clean up any spills right away. Avoid walking on  wet floors. Keep items that you use a lot in easy-to-reach places. If you need to reach something above you, use a strong step stool that has a grab bar. Keep electrical cords out of the way. Do not use floor polish or wax that makes floors slippery. If you must use wax, use non-skid floor wax. Do not have throw rugs and other things on the floor that can make you trip. What can I do with my stairs? Do not leave any items on the stairs. Make sure that there are handrails on both sides of the stairs and use them. Fix handrails that are broken or loose. Make sure that handrails are as long as the stairways. Check any carpeting to make sure that it is firmly attached to the stairs. Fix any carpet that is loose or worn. Avoid having throw rugs at the top or bottom of the stairs. If you do have throw rugs, attach them to the floor with carpet  tape. Make sure that you have a light switch at the top of the stairs and the bottom of the stairs. If you do not have them, ask someone to add them for you. What else can I do to help prevent falls? Wear shoes that: Do not have high heels. Have rubber bottoms. Are comfortable and fit you well. Are closed at the toe. Do not wear sandals. If you use a stepladder: Make sure that it is fully opened. Do not climb a closed stepladder. Make sure that both sides of the stepladder are locked into place. Ask someone to hold it for you, if possible. Clearly mark and make sure that you can see: Any grab bars or handrails. First and last steps. Where the edge of each step is. Use tools that help you move around (mobility aids) if they are needed. These include: Canes. Walkers. Scooters. Crutches. Turn on the lights when you go into a dark area. Replace any light bulbs as soon as they burn out. Set up your furniture so you have a clear path. Avoid moving your furniture around. If any of your floors are uneven, fix them. If there are any pets around you, be aware of where they are. Review your medicines with your doctor. Some medicines can make you feel dizzy. This can increase your chance of falling. Ask your doctor what other things that you can do to help prevent falls. This information is not intended to replace advice given to you by your health care provider. Make sure you discuss any questions you have with your health care provider. Document Released: 09/14/2009 Document Revised: 04/25/2016 Document Reviewed: 12/23/2014 Elsevier Interactive Patient Education  2017 [employer].

## 2021-06-15 NOTE — Progress Notes (Signed)
Subjective:   Cindy Stein is a 65 y.o. female who presents for Medicare Annual (Subsequent) preventive examination.  Review of Systems    No ROS.  Medicare Wellness Virtual Visit.  Visual/audio telehealth visit, UTA vital signs.   See social history for additional risk factors.   Cardiac Risk Factors include: advanced age (>61men, >63 women)     Objective:    Today's Vitals   06/15/21 1400  Weight: 201 lb (91.2 kg)  Height: 5\' 2"  (1.575 m)   Body mass index is 36.76 kg/m.  Advanced Directives 06/15/2021 06/14/2020 04/26/2020 03/22/2020 03/22/2020 03/20/2020 06/14/2019  Does Patient Have a Medical Advance Directive? Yes Yes No Yes Yes No No  Type of Advance Directive Living will;Healthcare Power of Attorney Living will - Living will Living will - -  Does patient want to make changes to medical advance directive? No - Patient declined No - Patient declined - No - Patient declined - - -  Copy of [place] in Chart? No - copy requested - - - - - -  Would patient like information on creating a medical advance directive? - - - - - No - Patient declined No - Guardian declined    Current Medications (verified) Outpatient Encounter Medications as of 06/15/2021  Medication Sig   acetaminophen (TYLENOL) 325 MG tablet Take 2 tablets (650 mg total) by mouth every 6 (six) hours as needed for mild pain (or Fever >/= 101). (Patient not taking: No sig reported)   albuterol (VENTOLIN HFA) 108 (90 Base) MCG/ACT inhaler Inhale 2 puffs into the lungs every 6 (six) hours as needed for wheezing or shortness of breath. (Patient not taking: No sig reported)   aspirin EC 81 MG tablet Take 81 mg by mouth daily. Swallow whole.   carvedilol (COREG) 3.125 MG tablet Take 1 tablet (3.125 mg total) by mouth 2 (two) times daily.   folic acid (FOLVITE) 1 MG tablet Take 1 mg by mouth daily.   methotrexate (RHEUMATREX) 2.5 MG tablet Take 15 mg by mouth once a week. (Patient not taking: Reported  on 06/13/2021)   olmesartan-hydrochlorothiazide (BENICAR HCT) 20-12.5 MG tablet TAKE 1 TABLET BY MOUTH ONCE A DAY   Turmeric 500 MG CAPS Take by mouth.   No facility-administered encounter medications on file as of 06/15/2021.    Allergies (verified) Acetaminophen, Hydrocodone, and Tramadol   History: Past Medical History:  Diagnosis Date   Arthritis of left ankle    Arthritis of left knee    Hypertension    Plantar fascial fibromatosis    Pulmonary embolus (HCC) 03/21/2020   PVC (premature ventricular contraction)    Unspecified osteoarthritis, unspecified site    Vitamin D deficiency    Past Surgical History:  Procedure Laterality Date   CARDIAC CATHETERIZATION  2006   carpel tunnel     GALLBLADDER SURGERY     IVC FILTER INSERTION N/A 12/11/2020   Procedure: IVC FILTER INSERTION;  Surgeon: Algernon Huxley, MD;  Location: [facility];  Service: Cardiovascular;  Laterality: N/A;   IVC FILTER REMOVAL N/A 04/19/2021   Procedure: IVC FILTER REMOVAL;  Surgeon: Algernon Huxley, MD;  Location: [facility] [facility];  Service: Cardiovascular;  Laterality: N/A;   LEFT HEART CATH AND CORONARY ANGIOGRAPHY Left 06/14/2019   Procedure: LEFT HEART CATH AND CORONARY ANGIOGRAPHY;  Surgeon: Wellington Hampshire, MD;  Location: [place] [facility];  Service: Cardiovascular;  Laterality: Left;   PULMONARY THROMBECTOMY Bilateral 03/22/2020   Procedure:  PULMONARY THROMBECTOMY / THROMBOLYSIS;  Surgeon: Algernon Huxley, MD;  Location: [place] [facility];  Service: Cardiovascular;  Laterality: Bilateral;   Family History  Problem Relation Age of Onset   Asthma Mother    Asthma Father    Heart failure Father    Leukemia Sister    Breast cancer Neg Hx    Social History   Socioeconomic History   Marital status: Married    Spouse name: Jeno   Number of children: 1   Years of education: Not on file   Highest education level: Not on file  Occupational History   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  Tobacco Use   Smoking status: Never   Smokeless tobacco: Never  Vaping Use   Vaping Use: Never used  Substance and Sexual Activity   Alcohol use: Yes    Comment: occass   Drug use: Never   Sexual activity: Not Currently  Other Topics Concern   Not on file  Social History Narrative   Lives at home with spouse   Social Determinants of Health   Financial Resource Strain: Low Risk    Difficulty of Paying Living Expenses: Not hard at all  Food Insecurity: No Food Insecurity   Worried About Charity fundraiser in the Last Year: Never true   Arboriculturist in the Last Year: Never true  Transportation Needs: No Transportation Needs   Lack of Transportation (Medical): No   Lack of Transportation (Non-Medical): No  Physical Activity: Sufficiently Active   Days of Exercise per Week: 5 days   Minutes of Exercise per Session: 30 min  Stress: No Stress Concern Present   Feeling of Stress : Not at all  Social Connections: Unknown   Frequency of Communication with Friends and Family: More than three times a week   Frequency of Social Gatherings with Friends and Family: More than three times a week   Attends Religious Services: Not on Electrical engineer or Organizations: Not on file   Attends Archivist Meetings: Not on file   Marital Status: Married    Tobacco Counseling Counseling given: Not Answered   Clinical Intake:  Pre-visit preparation completed: Yes        Diabetes: No  How often do you need to have someone help you when you read instructions, pamphlets, or other written materials from your doctor or pharmacy?: 1 - Never    Interpreter Needed?: No      Activities of Daily Living In your present state of health, do you have any difficulty performing the following activities: 06/15/2021 04/19/2021  Hearing? N N  Vision? N N  Difficulty concentrating or making decisions? N N  Walking or climbing stairs? N N  Dressing or  bathing? N N  Doing errands, shopping? N -  Preparing Food and eating ? N -  Using the Toilet? N -  In the past six months, have you accidently leaked urine? N -  Do you have problems with loss of bowel control? N -  Managing your Medications? N -  Managing your Finances? N -  Housekeeping or managing your Housekeeping? N -  Some recent data might be hidden    Patient Care Team: Leone Haven, MD as PCP - General (Family Medicine) Wellington Hampshire, MD as PCP - Cardiology (Cardiology) De Hollingshead, RPH-CPP as Pharmacist (Pharmacist)  Indicate any recent Medical Services you may have received from  other than [facility] providers in the past year (date may be approximate).     Assessment:   This is a routine wellness examination for [place].  I connected with [place] today by telephone and verified that I am speaking with the correct person using two identifiers. Location patient: home Location provider: work Persons participating in the virtual visit: patient, Marine scientist.    I discussed the limitations, risks, security and privacy concerns of performing an evaluation and management service by telephone and the availability of in person appointments. The patient expressed understanding and verbally consented to this telephonic visit.    Interactive audio and video telecommunications were attempted between this provider and patient, however failed, due to patient having technical difficulties OR patient did not have access to video capability.  We continued and completed visit with audio only.  Some vital signs may be absent or patient reported.   Hearing/Vision screen Hearing Screening - Comments:: Patient is able to hear conversational tones without difficulty.  No issues reported.    Vision Screening - Comments:: Cataract extraction, bilateral They have seen their ophthalmologist in the last 12 months.    Dietary issues and exercise activities discussed:   Healthy  diet Good water intake   Goals Addressed             This Visit's Progress    Healthy lifestyle       Healthy diet Stay active       Depression Screen PHQ 2/9 Scores 06/15/2021 01/26/2021 08/29/2020 06/14/2020 03/28/2020  PHQ - 2 Score 0 0 0 0 0    Fall Risk Fall Risk  06/15/2021 01/26/2021 08/29/2020 06/14/2020  Falls in the past year? 0 0 0 0  Number falls in past yr: 0 0 0 0  Injury with Fall? 0 - - -  Follow up Falls evaluation completed Falls evaluation completed Falls evaluation completed Falls evaluation completed    [place]: Handrails in use when climbing stairs? Yes Home free of loose throw rugs in walkways, pet beds, electrical cords, etc? Yes  Adequate lighting in your home to reduce risk of falls? Yes   ASSISTIVE DEVICES UTILIZED TO PREVENT FALLS:  Life alert? No  Use of a cane, walker or w/c? No   TIMED UP AND GO: Was the test performed? No .  Length of time to ambulate 10 feet:   Cognitive Function:  6CIT Screen 06/15/2021 06/14/2020  What Year? 0 points 0 points  What month? 0 points 0 points  What time? 0 points -  Count back from 20 0 points -  Months in reverse 0 points 0 points  Repeat phrase 0 points 0 points  Total Score 0 -    Immunizations Immunization History  Administered Date(s) Administered   Fluad Quad(high Dose 65+) 09/04/2020    TDAP status: Due, Education has been provided regarding the importance of this vaccine. Advised may receive this vaccine at local pharmacy or Health Dept. Aware to provide a copy of the vaccination record if obtained from local pharmacy or Health Dept. Verbalized acceptance and understanding. Discontinued per patient preference.   Pneumococcal vaccine status: Declined,  Education has been provided regarding the importance of this vaccine but patient still declined. Advised may receive this vaccine at local pharmacy or Health Dept. Aware to provide a copy of the vaccination  record if obtained from local pharmacy or Health Dept. Verbalized acceptance and understanding. Discontinued per patient preference.    Covid-19 vaccine status: Declined,  Education has been provided regarding the importance of this vaccine but patient still declined. Advised may receive this vaccine at local pharmacy or Health Dept.or vaccine clinic. Aware to provide a copy of the vaccination record if obtained from local pharmacy or Health Dept. Verbalized acceptance and understanding. Discontinued per patient preference.   Shingrix vaccine- Discontinued per patient preference.   Health Maintenance Health Maintenance  Topic Date Due   DEXA SCAN  06/15/2022 (Originally 02/04/2019)   COLONOSCOPY (Pts 45-33yrs Insurance coverage will need to be confirmed)  06/15/2022 (Originally 02/04/1999)   INFLUENZA VACCINE  07/02/2021   MAMMOGRAM  04/06/2022   HPV VACCINES  Aged Out   TETANUS/TDAP  Discontinued   COVID-19 Vaccine  Discontinued   Hepatitis C Screening  Discontinued   PNA vac Low Risk Adult  Discontinued   Zoster Vaccines- Shingrix  Discontinued   Colonoscopy/Cologuard- discussed, deferred per patient.   Bone density- discussed, deferred per patient.   Lung Cancer Screening: (Low Dose CT Chest recommended if Age 57-80 years, 30 pack-year currently smoking OR have quit w/in 15years.) does not qualify.   Hepatitis C Screening: discussed, discontinued per patient.   Vision Screening: Recommended annual ophthalmology exams for early detection of glaucoma and other disorders of the eye.  Dental Screening: Recommended annual dental exams for proper oral hygiene  Community Resource Referral / Chronic Care Management: CRR required this visit?  No   CCM required this visit?  No      Plan:   Keep all routine maintenance appointments.   I have personally reviewed and noted the following in the patient's chart:   Medical and social history Use of alcohol, tobacco or illicit drugs   Current medications and supplements including opioid prescriptions. Patient is not currently taking opioid.  Functional ability and status Nutritional status Physical activity Advanced directives List of other physicians Hospitalizations, surgeries, and ER visits in previous 12 months Vitals Screenings to include cognitive, depression, and falls Referrals and appointments  In addition, I have reviewed and discussed with patient certain preventive protocols, quality metrics, and best practice recommendations. A written personalized care plan for preventive services as well as general preventive health recommendations were provided to patient via mail.     Varney Biles, LPN   05/25/7627

## 2021-06-19 DIAGNOSIS — M0579 Rheumatoid arthritis with rheumatoid factor of multiple sites without organ or systems involvement: Secondary | ICD-10-CM | POA: Diagnosis not present

## 2021-06-28 DIAGNOSIS — M0579 Rheumatoid arthritis with rheumatoid factor of multiple sites without organ or systems involvement: Secondary | ICD-10-CM | POA: Diagnosis not present

## 2021-06-28 DIAGNOSIS — I2699 Other pulmonary embolism without acute cor pulmonale: Secondary | ICD-10-CM | POA: Diagnosis not present

## 2021-06-28 DIAGNOSIS — M159 Polyosteoarthritis, unspecified: Secondary | ICD-10-CM | POA: Diagnosis not present

## 2021-07-02 ENCOUNTER — Encounter: Payer: Self-pay | Admitting: Obstetrics and Gynecology

## 2021-07-02 ENCOUNTER — Ambulatory Visit (INDEPENDENT_AMBULATORY_CARE_PROVIDER_SITE_OTHER): Payer: Medicare Other | Admitting: Obstetrics and Gynecology

## 2021-07-02 ENCOUNTER — Other Ambulatory Visit: Payer: Self-pay

## 2021-07-02 ENCOUNTER — Telehealth (INDEPENDENT_AMBULATORY_CARE_PROVIDER_SITE_OTHER): Payer: Self-pay | Admitting: Vascular Surgery

## 2021-07-02 VITALS — BP 139/76 | HR 73 | Ht 62.0 in | Wt 203.3 lb

## 2021-07-02 DIAGNOSIS — N813 Complete uterovaginal prolapse: Secondary | ICD-10-CM

## 2021-07-02 DIAGNOSIS — N814 Uterovaginal prolapse, unspecified: Secondary | ICD-10-CM

## 2021-07-02 DIAGNOSIS — Z01818 Encounter for other preprocedural examination: Secondary | ICD-10-CM

## 2021-07-02 MED ORDER — METRONIDAZOLE 500 MG PO TABS: 500.0000 mg | ORAL_TABLET | Freq: Two times a day (BID) | ORAL | 0 refills | Status: DC

## 2021-07-02 MED ORDER — ESTRADIOL 0.1 MG/GM VA CREA: 0.2500 | TOPICAL_CREAM | VAGINAL | 3 refills | Status: DC

## 2021-07-02 NOTE — H&P (View-Only) (Signed)
PRE-OPERATIVE HISTORY AND PHYSICAL EXAM  PCP:  Leone Haven, MD Subjective:   HPI:  Cindy Stein is a 65 y.o. G1P1001.  No LMP recorded. Patient is postmenopausal.  She presents today for a pre-op discussion and PE. She reports that she is not sexually active and has no plans in the future for sexual activity.  She states that her prolapse have become significantly worse in the last month.  Please see previous office notes regarding signs symptoms and examination. She has the following symptoms: Total uterine/pelvic descensus.  Pelvic discomfort.  Review of Systems:   Constitutional: Denied constitutional symptoms, night sweats, recent illness, fatigue, fever, insomnia and weight loss.  Eyes: Denied eye symptoms, eye pain, photophobia, vision change and visual disturbance.  Ears/Nose/Throat/Neck: Denied ear, nose, throat or neck symptoms, hearing loss, nasal discharge, sinus congestion and sore throat.  Cardiovascular: Denied cardiovascular symptoms, arrhythmia, chest pain/pressure, edema, exercise intolerance, orthopnea and palpitations.  Respiratory: Denied pulmonary symptoms, asthma, pleuritic pain, productive sputum, cough, dyspnea and wheezing.  Gastrointestinal: Denied, gastro-esophageal reflux, melena, nausea and vomiting.  Genitourinary: See HPI for additional information.  Musculoskeletal: Denied musculoskeletal symptoms, stiffness, swelling, muscle weakness and myalgia.  Dermatologic: Denied dermatology symptoms, rash and scar.  Neurologic: Denied neurology symptoms, dizziness, headache, neck pain and syncope.  Psychiatric: Denied psychiatric symptoms, anxiety and depression.  Endocrine: Denied endocrine symptoms including hot flashes and night sweats.   OB History  Gravida Para Term Preterm AB Living  '1 1 1     1  '$ SAB IAB Ectopic Multiple Live Births          1    # Outcome Date GA Lbr Len/2nd Weight Sex Delivery Anes PTL Lv  1 Term 1973    F Vag-Spont    LIV    Past Medical History:  Diagnosis Date   Arthritis of left ankle    Arthritis of left knee    Hypertension    Plantar fascial fibromatosis    Pulmonary embolus ([place]) 03/21/2020   PVC (premature ventricular contraction)    Unspecified osteoarthritis, unspecified site    Vitamin D deficiency     Past Surgical History:  Procedure Laterality Date   CARDIAC CATHETERIZATION  2006   carpel tunnel     GALLBLADDER SURGERY     IVC FILTER INSERTION N/A 12/11/2020   Procedure: IVC FILTER INSERTION;  Surgeon: Algernon Huxley, MD;  Location: [place] [facility];  Service: Cardiovascular;  Laterality: N/A;   IVC FILTER REMOVAL N/A 04/19/2021   Procedure: IVC FILTER REMOVAL;  Surgeon: Algernon Huxley, MD;  Location: [place] [facility];  Service: Cardiovascular;  Laterality: N/A;   LEFT HEART CATH AND CORONARY ANGIOGRAPHY Left 06/14/2019   Procedure: LEFT HEART CATH AND CORONARY ANGIOGRAPHY;  Surgeon: Wellington Hampshire, MD;  Location: [place] [facility];  Service: Cardiovascular;  Laterality: Left;   PULMONARY THROMBECTOMY Bilateral 03/22/2020   Procedure: PULMONARY THROMBECTOMY / THROMBOLYSIS;  Surgeon: Algernon Huxley, MD;  Location: [place] [facility];  Service: Cardiovascular;  Laterality: Bilateral;      SOCIAL HISTORY:  Social History   Tobacco Use  Smoking Status Never  Smokeless Tobacco Never   Social History   Substance and Sexual Activity  Alcohol Use Yes   Comment: occass    Social History   Substance and Sexual Activity  Drug Use Never    Family History  Problem Relation Age of Onset   Asthma Mother    Asthma  Father    Heart failure Father    Leukemia Sister    Breast cancer Neg Hx     ALLERGIES:  Acetaminophen, Hydrocodone, and Tramadol  MEDS:   Current Outpatient Medications on File Prior to Visit  Medication Sig Dispense Refill   aspirin EC 81 MG tablet Take 81 mg by mouth daily. Swallow whole.     carvedilol (COREG) 3.125 MG tablet Take 1 tablet (3.125  mg total) by mouth 2 (two) times daily. [id number] tablet 1   folic acid (FOLVITE) 1 MG tablet Take 1 mg by mouth daily.     methotrexate (RHEUMATREX) 2.5 MG tablet Take 15 mg by mouth once a week.     olmesartan-hydrochlorothiazide (BENICAR HCT) 20-12.5 MG tablet TAKE 1 TABLET BY MOUTH ONCE A DAY 90 tablet 1   Turmeric 500 MG CAPS Take by mouth.     acetaminophen (TYLENOL) 325 MG tablet Take 2 tablets (650 mg total) by mouth every 6 (six) hours as needed for mild pain (or Fever >/= 101). (Patient not taking: No sig reported)     albuterol (VENTOLIN HFA) 108 (90 Base) MCG/ACT inhaler Inhale 2 puffs into the lungs every 6 (six) hours as needed for wheezing or shortness of breath. (Patient not taking: No sig reported) 8 g 2   No current facility-administered medications on file prior to visit.    Meds ordered this encounter  Medications   metroNIDAZOLE (FLAGYL) 500 MG tablet    Sig: Take 1 tablet (500 mg total) by mouth 2 (two) times daily. Begin 5 days prior to scheduled surgery as directed.    Dispense:  10 tablet    Refill:  0   estradiol (ESTRACE) 0.1 MG/GM vaginal cream    Sig: Place [id number] Applicatorfuls vaginally 3 (three) times a week. 1/3 applicator at bedtime every other night.    Dispense:  90 g    Refill:  3     Physical examination BP 139/76   Pulse 73   Ht '5\' 2"'$  (1.575 m)   Wt 203 lb 4.8 oz (92.2 kg)   BMI 37.18 kg/m   General NAD, Conversant  HEENT Atraumatic; Op clear with mmm.  Normo-cephalic. Pupils reactive. Anicteric sclerae  Thyroid/Neck Smooth without nodularity or enlargement. Normal ROM.  Neck Supple.  Skin No rashes, lesions or ulceration. Normal palpated skin turgor. No nodularity.  Breasts: No masses or discharge.  Symmetric.  No axillary adenopathy.  Lungs: Clear to auscultation.No rales or wheezes. Normal Respiratory effort, no retractions.  Heart: NSR.  No murmurs or rubs appreciated. No periferal edema  Abdomen: Soft.  Non-tender.  No masses.  No HSM. No  hernia  Extremities: Moves all appropriately.  Normal ROM for age. No lymphadenopathy.  Neuro: Oriented to PPT.  Normal mood. Normal affect.   Pelvic:    Vulva: Normal appearance.  No lesions.   Vagina: No lesions or abnormalities noted.  Large cystocele and rectocele   Support: Complete procidentia   Urethra No masses tenderness or scarring.   Meatus Normal size without lesions or prolapse.   Cervix: Normal appearance.  No lesions.   Anus: Normal exam.  No lesions.   Perineum: Normal exam.  No lesions.         Bimanual    Uterus: Normal size.  Non-tender.  Mobile.  AV.  See above     Adnexae: No masses.  Non-tender to palpation.    Cul-de-sac: Negative for abnormality.     Assessment:   G1P1001 Patient Active  Problem List   Diagnosis Date Noted   S/P IVC filter 04/03/2021   Proteinuria 01/26/2021   Microscopic hematuria 01/26/2021   Pre-bariatric surgery nutrition evaluation 12/05/2020   History of cholecystectomy 10/24/2020   History of decompression of median nerve 10/24/2020   History of total knee arthroplasty 10/24/2020   Hypertensive disorder 10/24/2020   Osteoarthritis of left knee 10/24/2020   Dyspnea on exertion 08/29/2020   Obesity 08/29/2020   Palpitations 06/27/2020   Pulmonary nodules 03/28/2020   History of pulmonary embolus (PE) 03/21/2020   History of DVT (deep vein thrombosis) 03/21/2020   Hypertension    PVC (premature ventricular contraction)     1. Preop examination   2. 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 Complete uterine prolapse     She desires definitive management -pelvic surgery for resolution.  Plan:   Orders: Meds ordered this encounter  Medications   metroNIDAZOLE (FLAGYL) 500 MG tablet    Sig: Take 1 tablet (500 mg total) by mouth 2 (two) times daily. Begin 5 days prior to scheduled surgery as directed.    Dispense:  10 tablet    Refill:  0   estradiol (ESTRACE) 0.1 MG/GM vaginal cream    Sig: Place [id number]  Applicatorfuls vaginally 3 (three) times a week. 1/3 applicator at bedtime every other night.    Dispense:  90 g    Refill:  3     1.  TVH, A&P repair-TOT, perineoplasty

## 2021-07-02 NOTE — Telephone Encounter (Signed)
They need to fax clearance forms over

## 2021-07-02 NOTE — Telephone Encounter (Signed)
Patient will be having surgery on July 23, 2021 with Dr. Amalia Hailey.  Patient provider wants to know will she be cleared for surgery from Dr. Lucky Cowboy. If any notes is needed from Dr. Lucky Cowboy.  Please advise.  [facility] fax [phone]

## 2021-07-02 NOTE — Progress Notes (Signed)
PRE-OPERATIVE HISTORY AND PHYSICAL EXAM  PCP:  Leone Haven, MD Subjective:   HPI:  Cindy Stein is a 65 y.o. G1P1001.  No LMP recorded. Patient is postmenopausal.  She presents today for a pre-op discussion and PE. She reports that she is not sexually active and has no plans in the future for sexual activity.  She states that her prolapse have become significantly worse in the last month.  Please see previous office notes regarding signs symptoms and examination. She has the following symptoms: Total uterine/pelvic descensus.  Pelvic discomfort.  Review of Systems:   Constitutional: Denied constitutional symptoms, night sweats, recent illness, fatigue, fever, insomnia and weight loss.  Eyes: Denied eye symptoms, eye pain, photophobia, vision change and visual disturbance.  Ears/Nose/Throat/Neck: Denied ear, nose, throat or neck symptoms, hearing loss, nasal discharge, sinus congestion and sore throat.  Cardiovascular: Denied cardiovascular symptoms, arrhythmia, chest pain/pressure, edema, exercise intolerance, orthopnea and palpitations.  Respiratory: Denied pulmonary symptoms, asthma, pleuritic pain, productive sputum, cough, dyspnea and wheezing.  Gastrointestinal: Denied, gastro-esophageal reflux, melena, nausea and vomiting.  Genitourinary: See HPI for additional information.  Musculoskeletal: Denied musculoskeletal symptoms, stiffness, swelling, muscle weakness and myalgia.  Dermatologic: Denied dermatology symptoms, rash and scar.  Neurologic: Denied neurology symptoms, dizziness, headache, neck pain and syncope.  Psychiatric: Denied psychiatric symptoms, anxiety and depression.  Endocrine: Denied endocrine symptoms including hot flashes and night sweats.   OB History  Gravida Para Term Preterm AB Living  '1 1 1     1  '$ SAB IAB Ectopic Multiple Live Births          1    # Outcome Date GA Lbr Len/2nd Weight Sex Delivery Anes PTL Lv  1 Term 1973    F Vag-Spont    LIV    Past Medical History:  Diagnosis Date   Arthritis of left ankle    Arthritis of left knee    Hypertension    Plantar fascial fibromatosis    Pulmonary embolus ([place]) 03/21/2020   PVC (premature ventricular contraction)    Unspecified osteoarthritis, unspecified site    Vitamin D deficiency     Past Surgical History:  Procedure Laterality Date   CARDIAC CATHETERIZATION  2006   carpel tunnel     GALLBLADDER SURGERY     IVC FILTER INSERTION N/A 12/11/2020   Procedure: IVC FILTER INSERTION;  Surgeon: Algernon Huxley, MD;  Location: [facility];  Service: Cardiovascular;  Laterality: N/A;   IVC FILTER REMOVAL N/A 04/19/2021   Procedure: IVC FILTER REMOVAL;  Surgeon: Algernon Huxley, MD;  Location: [facility];  Service: Cardiovascular;  Laterality: N/A;   LEFT HEART CATH AND CORONARY ANGIOGRAPHY Left 06/14/2019   Procedure: LEFT HEART CATH AND CORONARY ANGIOGRAPHY;  Surgeon: Wellington Hampshire, MD;  Location: [facility];  Service: Cardiovascular;  Laterality: Left;   PULMONARY THROMBECTOMY Bilateral 03/22/2020   Procedure: PULMONARY THROMBECTOMY / THROMBOLYSIS;  Surgeon: Algernon Huxley, MD;  Location: [facility];  Service: Cardiovascular;  Laterality: Bilateral;      SOCIAL HISTORY:  Social History   Tobacco Use  Smoking Status Never  Smokeless Tobacco Never   Social History   Substance and Sexual Activity  Alcohol Use Yes   Comment: occass    Social History   Substance and Sexual Activity  Drug Use Never    Family History  Problem Relation Age of Onset   Asthma Mother    Asthma  Father    Heart failure Father    Leukemia Sister    Breast cancer Neg Hx     ALLERGIES:  Acetaminophen, Hydrocodone, and Tramadol  MEDS:   Current Outpatient Medications on File Prior to Visit  Medication Sig Dispense Refill   aspirin EC 81 MG tablet Take 81 mg by mouth daily. Swallow whole.     carvedilol (COREG) 3.125 MG tablet Take 1 tablet (3.125  mg total) by mouth 2 (two) times daily. 99991111 tablet 1   folic acid (FOLVITE) 1 MG tablet Take 1 mg by mouth daily.     methotrexate (RHEUMATREX) 2.5 MG tablet Take 15 mg by mouth once a week.     olmesartan-hydrochlorothiazide (BENICAR HCT) 20-12.5 MG tablet TAKE 1 TABLET BY MOUTH ONCE A DAY 90 tablet 1   Turmeric 500 MG CAPS Take by mouth.     acetaminophen (TYLENOL) 325 MG tablet Take 2 tablets (650 mg total) by mouth every 6 (six) hours as needed for mild pain (or Fever >/= 101). (Patient not taking: No sig reported)     albuterol (VENTOLIN HFA) 108 (90 Base) MCG/ACT inhaler Inhale 2 puffs into the lungs every 6 (six) hours as needed for wheezing or shortness of breath. (Patient not taking: No sig reported) 8 g 2   No current facility-administered medications on file prior to visit.    Meds ordered this encounter  Medications   metroNIDAZOLE (FLAGYL) 500 MG tablet    Sig: Take 1 tablet (500 mg total) by mouth 2 (two) times daily. Begin 5 days prior to scheduled surgery as directed.    Dispense:  10 tablet    Refill:  0   estradiol (ESTRACE) 0.1 MG/GM vaginal cream    Sig: Place [id number] Applicatorfuls vaginally 3 (three) times a week. 1/3 applicator at bedtime every other night.    Dispense:  90 g    Refill:  3     Physical examination BP 139/76   Pulse 73   Ht '5\' 2"'$  (1.575 m)   Wt 203 lb 4.8 oz (92.2 kg)   BMI 37.18 kg/m   General NAD, Conversant  HEENT Atraumatic; Op clear with mmm.  Normo-cephalic. Pupils reactive. Anicteric sclerae  Thyroid/Neck Smooth without nodularity or enlargement. Normal ROM.  Neck Supple.  Skin No rashes, lesions or ulceration. Normal palpated skin turgor. No nodularity.  Breasts: No masses or discharge.  Symmetric.  No axillary adenopathy.  Lungs: Clear to auscultation.No rales or wheezes. Normal Respiratory effort, no retractions.  Heart: NSR.  No murmurs or rubs appreciated. No periferal edema  Abdomen: Soft.  Non-tender.  No masses.  No HSM. No  hernia  Extremities: Moves all appropriately.  Normal ROM for age. No lymphadenopathy.  Neuro: Oriented to PPT.  Normal mood. Normal affect.   Pelvic:    Vulva: Normal appearance.  No lesions.   Vagina: No lesions or abnormalities noted.  Large cystocele and rectocele   Support: Complete procidentia   Urethra No masses tenderness or scarring.   Meatus Normal size without lesions or prolapse.   Cervix: Normal appearance.  No lesions.   Anus: Normal exam.  No lesions.   Perineum: Normal exam.  No lesions.         Bimanual    Uterus: Normal size.  Non-tender.  Mobile.  AV.  See above     Adnexae: No masses.  Non-tender to palpation.    Cul-de-sac: Negative for abnormality.     Assessment:   G1P1001 Patient Active  Problem List   Diagnosis Date Noted   S/P IVC filter 04/03/2021   Proteinuria 01/26/2021   Microscopic hematuria 01/26/2021   Pre-bariatric surgery nutrition evaluation 12/05/2020   History of cholecystectomy 10/24/2020   History of decompression of median nerve 10/24/2020   History of total knee arthroplasty 10/24/2020   Hypertensive disorder 10/24/2020   Osteoarthritis of left knee 10/24/2020   Dyspnea on exertion 08/29/2020   Obesity 08/29/2020   Palpitations 06/27/2020   Pulmonary nodules 03/28/2020   History of pulmonary embolus (PE) 03/21/2020   History of DVT (deep vein thrombosis) 03/21/2020   Hypertension    PVC (premature ventricular contraction)     1. Preop examination   2. 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 Complete uterine prolapse     She desires definitive management -pelvic surgery for resolution.  Plan:   Orders: Meds ordered this encounter  Medications   metroNIDAZOLE (FLAGYL) 500 MG tablet    Sig: Take 1 tablet (500 mg total) by mouth 2 (two) times daily. Begin 5 days prior to scheduled surgery as directed.    Dispense:  10 tablet    Refill:  0   estradiol (ESTRACE) 0.1 MG/GM vaginal cream    Sig: Place [id number]  Applicatorfuls vaginally 3 (three) times a week. 1/3 applicator at bedtime every other night.    Dispense:  90 g    Refill:  3     1.  TVH, A&P repair-TOT, perineoplasty  Pre-op discussions regarding Risks and Benefits of her scheduled surgery.  TVH The procedure of total vaginal hysterectomy was explained to the patient in detail.  We reviewed the rationale for hysterectomy and the patient was again informed of other non-surgical management possibilities for her condition.  She has considered these other options and desires a hysterectomy.  We've reviewed that fact that hysterectomy is a permanent procedure and she will not be able to become pregnant or bear children following it.  We have discussed the following risk factors specifically and the patient has also been informed that additional complications not mentioned may develop;  damage to bowel, bladder, ureters, rectum, other internal organs, bleeding, infection and the risk of anesthesia.  We have discussed the procedure itself in detail and she has an informed understanding of this surgery.  We have also discussed the recovery period in which physical and sexual activity will be restricted for a varying degree of time,  often 3-6 weeks.  I have answered all her questions and I believe she has an informed understanding of total vaginal hysterectomy. Anterior Repair I have discussed the procedure of anterior repair and Kelly placation.  I have informed the patient that this procedure often corrects or improves stress urinary incontinence, but that there is certainly no guarantee of her improvement.  The procedure itself was discussed.  The possible damage to the bowel, ureters or urethra was also discussed.  We have reviewed the repositioning of the bladder that often takes place at anterior repair and I have informed her that although unlikely, it is possible that a worsening of her incontinence could occur after this procedure.  I have also  discussed with her the necessity of decreased lifting and physical activity following the procedure as well as the possibility that as she gets older, her stress urinary incontinence could slowly return.  The use of vaginal Estrogen or oral Estrogen as well as other medications in the role of both stress and bladder dysynergia incontinence were discussed.  I have  discussed the complication of inability to void immediately following the procedure.  The patient is aware that she may go home using a Foley catheter or may be taught the technique of self-catheterization should a complication develop.  All of her questions have been answered and I believe that she has an informed understanding of anterior repair/Kelly plication. TOT I have discussed the procedure of tension free vaginal tape using the trans-obturator approach. (TOT).  I have informed the patient that this procedure often corrects or improves stress urinary incontinence.  For patients without urinary incontinence-this procedure is often performed to lower the risk of iatragenic urinary incontinence at the time of cystocele repair.The patient has been made aware that there is no guarantee of her improvement or the length of time her improvement will last.  The procedure itself was discussed in detail including possible damage to bowel, ureters, urethra and bladder.  I have informed her that the mesh is permanent.  The risk of extrusion of the mesh has also been reviewed.  The management of this complication has been discussed.  The risks of bleeding and infection were also reviewed.  I have specifically discussed the complication of inability to void following the procedure and the patient is aware that she may go home using a Foley catheter or using a self-catheterization technique.  In addition, I have discussed with the patient the use of cystoscopy to diagnose bladder injury should there be any question of this.     I spent 36 minutes involved in  the care of this patient preparing to see the patient by obtaining and reviewing her medical history (including labs, imaging tests and prior procedures), documenting clinical information in the electronic health record (EHR), counseling and coordinating care plans, writing and sending prescriptions, ordering tests or procedures and in direct communicating with the patient and medical staff discussing pertinent items from her history and physical exam.  Finis Bud, M.D. 07/02/2021 12:05 PM

## 2021-07-02 NOTE — H&P (Signed)
PRE-OPERATIVE HISTORY AND PHYSICAL EXAM  PCP:  Leone Haven, MD Subjective:   HPI:  Cindy Stein is a 64 y.o. G1P1001.  No LMP recorded. Patient is postmenopausal.  She presents today for a pre-op discussion and PE. She reports that she is not sexually active and has no plans in the future for sexual activity.  She states that her prolapse have become significantly worse in the last month.  Please see previous office notes regarding signs symptoms and examination. She has the following symptoms: Total uterine/pelvic descensus.  Pelvic discomfort.  Review of Systems:   Constitutional: Denied constitutional symptoms, night sweats, recent illness, fatigue, fever, insomnia and weight loss.  Eyes: Denied eye symptoms, eye pain, photophobia, vision change and visual disturbance.  Ears/Nose/Throat/Neck: Denied ear, nose, throat or neck symptoms, hearing loss, nasal discharge, sinus congestion and sore throat.  Cardiovascular: Denied cardiovascular symptoms, arrhythmia, chest pain/pressure, edema, exercise intolerance, orthopnea and palpitations.  Respiratory: Denied pulmonary symptoms, asthma, pleuritic pain, productive sputum, cough, dyspnea and wheezing.  Gastrointestinal: Denied, gastro-esophageal reflux, melena, nausea and vomiting.  Genitourinary: See HPI for additional information.  Musculoskeletal: Denied musculoskeletal symptoms, stiffness, swelling, muscle weakness and myalgia.  Dermatologic: Denied dermatology symptoms, rash and scar.  Neurologic: Denied neurology symptoms, dizziness, headache, neck pain and syncope.  Psychiatric: Denied psychiatric symptoms, anxiety and depression.  Endocrine: Denied endocrine symptoms including hot flashes and night sweats.   OB History  Gravida Para Term Preterm AB Living  '1 1 1     1  '$ SAB IAB Ectopic Multiple Live Births          1    # Outcome Date GA Lbr Len/2nd Weight Sex Delivery Anes PTL Lv  1 Term 1973    F Vag-Spont    LIV    Past Medical History:  Diagnosis Date   Arthritis of left ankle    Arthritis of left knee    Hypertension    Plantar fascial fibromatosis    Pulmonary embolus ([place]) 03/21/2020   PVC (premature ventricular contraction)    Unspecified osteoarthritis, unspecified site    Vitamin D deficiency     Past Surgical History:  Procedure Laterality Date   CARDIAC CATHETERIZATION  2006   carpel tunnel     GALLBLADDER SURGERY     IVC FILTER INSERTION N/A 12/11/2020   Procedure: IVC FILTER INSERTION;  Surgeon: Algernon Huxley, MD;  Location: [facility];  Service: Cardiovascular;  Laterality: N/A;   IVC FILTER REMOVAL N/A 04/19/2021   Procedure: IVC FILTER REMOVAL;  Surgeon: Algernon Huxley, MD;  Location: [facility];  Service: Cardiovascular;  Laterality: N/A;   LEFT HEART CATH AND CORONARY ANGIOGRAPHY Left 06/14/2019   Procedure: LEFT HEART CATH AND CORONARY ANGIOGRAPHY;  Surgeon: Wellington Hampshire, MD;  Location: [facility];  Service: Cardiovascular;  Laterality: Left;   PULMONARY THROMBECTOMY Bilateral 03/22/2020   Procedure: PULMONARY THROMBECTOMY / THROMBOLYSIS;  Surgeon: Algernon Huxley, MD;  Location: [facility];  Service: Cardiovascular;  Laterality: Bilateral;      SOCIAL HISTORY:  Social History   Tobacco Use  Smoking Status Never  Smokeless Tobacco Never   Social History   Substance and Sexual Activity  Alcohol Use Yes   Comment: occass    Social History   Substance and Sexual Activity  Drug Use Never    Family History  Problem Relation Age of Onset   Asthma Mother    Asthma  Father    Heart failure Father    Leukemia Sister    Breast cancer Neg Hx     ALLERGIES:  Acetaminophen, Hydrocodone, and Tramadol  MEDS:   Current Outpatient Medications on File Prior to Visit  Medication Sig Dispense Refill   aspirin EC 81 MG tablet Take 81 mg by mouth daily. Swallow whole.     carvedilol (COREG) 3.125 MG tablet Take 1 tablet (3.125  mg total) by mouth 2 (two) times daily. 99991111 tablet 1   folic acid (FOLVITE) 1 MG tablet Take 1 mg by mouth daily.     methotrexate (RHEUMATREX) 2.5 MG tablet Take 15 mg by mouth once a week.     olmesartan-hydrochlorothiazide (BENICAR HCT) 20-12.5 MG tablet TAKE 1 TABLET BY MOUTH ONCE A DAY 90 tablet 1   Turmeric 500 MG CAPS Take by mouth.     acetaminophen (TYLENOL) 325 MG tablet Take 2 tablets (650 mg total) by mouth every 6 (six) hours as needed for mild pain (or Fever >/= 101). (Patient not taking: No sig reported)     albuterol (VENTOLIN HFA) 108 (90 Base) MCG/ACT inhaler Inhale 2 puffs into the lungs every 6 (six) hours as needed for wheezing or shortness of breath. (Patient not taking: No sig reported) 8 g 2   No current facility-administered medications on file prior to visit.    Meds ordered this encounter  Medications   metroNIDAZOLE (FLAGYL) 500 MG tablet    Sig: Take 1 tablet (500 mg total) by mouth 2 (two) times daily. Begin 5 days prior to scheduled surgery as directed.    Dispense:  10 tablet    Refill:  0   estradiol (ESTRACE) 0.1 MG/GM vaginal cream    Sig: Place [id number] Applicatorfuls vaginally 3 (three) times a week. 1/3 applicator at bedtime every other night.    Dispense:  90 g    Refill:  3     Physical examination BP 139/76   Pulse 73   Ht '5\' 2"'$  (1.575 m)   Wt 203 lb 4.8 oz (92.2 kg)   BMI 37.18 kg/m   General NAD, Conversant  HEENT Atraumatic; Op clear with mmm.  Normo-cephalic. Pupils reactive. Anicteric sclerae  Thyroid/Neck Smooth without nodularity or enlargement. Normal ROM.  Neck Supple.  Skin No rashes, lesions or ulceration. Normal palpated skin turgor. No nodularity.  Breasts: No masses or discharge.  Symmetric.  No axillary adenopathy.  Lungs: Clear to auscultation.No rales or wheezes. Normal Respiratory effort, no retractions.  Heart: NSR.  No murmurs or rubs appreciated. No periferal edema  Abdomen: Soft.  Non-tender.  No masses.  No HSM. No  hernia  Extremities: Moves all appropriately.  Normal ROM for age. No lymphadenopathy.  Neuro: Oriented to PPT.  Normal mood. Normal affect.   Pelvic:    Vulva: Normal appearance.  No lesions.   Vagina: No lesions or abnormalities noted.  Large cystocele and rectocele   Support: Complete procidentia   Urethra No masses tenderness or scarring.   Meatus Normal size without lesions or prolapse.   Cervix: Normal appearance.  No lesions.   Anus: Normal exam.  No lesions.   Perineum: Normal exam.  No lesions.         Bimanual    Uterus: Normal size.  Non-tender.  Mobile.  AV.  See above     Adnexae: No masses.  Non-tender to palpation.    Cul-de-sac: Negative for abnormality.     Assessment:   G1P1001 Patient Active  Problem List   Diagnosis Date Noted   S/P IVC filter 04/03/2021   Proteinuria 01/26/2021   Microscopic hematuria 01/26/2021   Pre-bariatric surgery nutrition evaluation 12/05/2020   History of cholecystectomy 10/24/2020   History of decompression of median nerve 10/24/2020   History of total knee arthroplasty 10/24/2020   Hypertensive disorder 10/24/2020   Osteoarthritis of left knee 10/24/2020   Dyspnea on exertion 08/29/2020   Obesity 08/29/2020   Palpitations 06/27/2020   Pulmonary nodules 03/28/2020   History of pulmonary embolus (PE) 03/21/2020   History of DVT (deep vein thrombosis) 03/21/2020   Hypertension    PVC (premature ventricular contraction)     1. Preop examination   2. 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 Complete uterine prolapse     She desires definitive management -pelvic surgery for resolution.  Plan:   Orders: Meds ordered this encounter  Medications   metroNIDAZOLE (FLAGYL) 500 MG tablet    Sig: Take 1 tablet (500 mg total) by mouth 2 (two) times daily. Begin 5 days prior to scheduled surgery as directed.    Dispense:  10 tablet    Refill:  0   estradiol (ESTRACE) 0.1 MG/GM vaginal cream    Sig: Place [id number]  Applicatorfuls vaginally 3 (three) times a week. 1/3 applicator at bedtime every other night.    Dispense:  90 g    Refill:  3     1.  TVH, A&P repair-TOT, perineoplasty

## 2021-07-02 NOTE — Telephone Encounter (Signed)
Please advise 

## 2021-07-03 ENCOUNTER — Telehealth: Payer: Self-pay | Admitting: Obstetrics and Gynecology

## 2021-07-03 NOTE — Telephone Encounter (Signed)
Patient states that she needs a medical clearance sent to Dr Bunnie Domino office for her surgery.  She stated that when she called Dr Bunnie Domino office she was told that was documentation that we needed to send to request medical clearance

## 2021-07-03 NOTE — Telephone Encounter (Signed)
Please advise 

## 2021-07-04 ENCOUNTER — Telehealth: Payer: Self-pay | Admitting: Cardiovascular Disease

## 2021-07-04 NOTE — Telephone Encounter (Signed)
   [facility] Pre-operative Risk Assessment    Patient Name: Cindy Stein  DOB: 05/27/1956 MRN: [id number]  HEARTCARE STAFF:  - IMPORTANT!!!!!! Under Visit Info/Reason for Call, type in Other and utilize the format Clearance MM/DD/YY or Clearance TBD. Do not use dashes or single digits. - Please review there is not already an duplicate clearance open for this procedure. - If request is for dental extraction, please clarify the # of teeth to be extracted. - If the patient is currently at the dentist's office, call Pre-Op Callback Staff (MA/nurse) to input urgent request.  - If the patient is not currently in the dentist office, please route to the Pre-Op pool.  Request for surgical clearance:  What type of surgery is being performed? Total Vaginal Hysterectomy, anterior and posterior repair TOT perineoplasty   When is this surgery scheduled? 07/23/21  What type of clearance is required (medical clearance vs. Pharmacy clearance to hold med vs. Both)? both  Are there any medications that need to be held prior to surgery and how long? Not listed, please advise if needed  Practice name and name of physician performing surgery? [facility] - Dr Jeannie Fend  What is the office phone number? [phone]   7.   What is the office fax number? [phone]  8.   Anesthesia type (None, local, MAC, general) ? General    Caryl Pina Gerringer 07/04/2021, 2:51 PM  _________________________________________________________________   (provider comments below)

## 2021-07-04 NOTE — Telephone Encounter (Signed)
   Name: Cindy Stein  DOB: [birth year]  MRN: [id number]  Primary Cardiologist: [name], MD  Chart reviewed as part of pre-operative protocol coverage. Because of [employer] past medical history and time since last visit, she will require a follow-up visit in order to better assess preoperative cardiovascular risk.  Pre-op covering staff: - Please schedule appointment and call patient to inform them. If patient already had an upcoming appointment within acceptable timeframe, please add "pre-op clearance" to the appointment notes so provider is aware. - Please contact requesting surgeon's office via preferred method (i.e, phone, fax) to inform them of need for appointment prior to surgery.  If applicable, this message will also be routed to pharmacy pool and/or primary cardiologist for input on holding anticoagulant/antiplatelet agent as requested below so that this information is available to the clearing provider at time of patient's appointment.   [place], [place]  07/04/2021, 3:58 PM

## 2021-07-04 NOTE — Telephone Encounter (Signed)
S/w pt and she is agreeable to appt for pre op clearance. Pt will see [place], [facility] 07/05/21 @ 3:30 at the [place] office. Pt is grateful for the call and the help today. I will forward notes to [facility] for appt tomorrow. Will send FYI to surgeon's office pt has appt 07/05/21.

## 2021-07-05 ENCOUNTER — Encounter: Payer: Self-pay | Admitting: Medical

## 2021-07-05 ENCOUNTER — Other Ambulatory Visit: Payer: Self-pay

## 2021-07-05 ENCOUNTER — Ambulatory Visit: Payer: Medicare Other | Admitting: Medical

## 2021-07-05 VITALS — BP 102/72 | HR 62 | Ht 62.0 in | Wt 203.0 lb

## 2021-07-05 DIAGNOSIS — Z01810 Encounter for preprocedural cardiovascular examination: Secondary | ICD-10-CM | POA: Diagnosis not present

## 2021-07-05 DIAGNOSIS — R0789 Other chest pain: Secondary | ICD-10-CM | POA: Diagnosis not present

## 2021-07-05 DIAGNOSIS — I493 Ventricular premature depolarization: Secondary | ICD-10-CM

## 2021-07-05 NOTE — Progress Notes (Signed)
Cardiology Office Note:    Date:  07/05/2021   ID:  Cindy Stein, DOB [birth year], MRN [id number]  PCP:  Leone Haven, MD  [facility] Cardiologist:  [name], MD  [facility] Electrophysiologist:  None   Referring MD: Leone Haven, MD   Chief Complaint: pre-operative cardiac evaluation  History of Present Illness:    Cindy Stein is a 65 y.o. 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  She had a cath 06/2019 for DOE showing normal coronaries, preserved LVEF, mildly elevated LEDP suspected mild diastolic heart failure.She was on HCTZ and this was continued. She was last seen 07/2019 and was stable, no changes were made.   Echo 07/2020 showed LVEF 60-65%, no WMA, normal diastolic parameters.   Today, the patient presents for cardiac evaluation for hysterectomy scheduled for August 22nd.  From a cardiac perspective she is doing well. No chest pain, shortness of breath, lower leg edema. Orthopnea, pnd, palpitations. Can walk up up a flight of stairs. She is very active. Her and her husband are building a house, so she stays active. Husband has some memory impairment. Not on Xarelto, stopped in May. She is taking Aspirin '81mg'$  daily. BP borderline. No lightheadedness or dizziness. Diet could be better, overall is good. She was instructed to stop aspirin 1 week before surgery. Also requested clearance from Dr. Lucky Cowboy.   Past Medical History:  Diagnosis Date   Arthritis of left ankle    Arthritis of left knee    Hypertension    Plantar fascial fibromatosis    Pulmonary embolus ([place]) 03/21/2020   PVC (premature ventricular contraction)    Unspecified osteoarthritis, unspecified site    Vitamin D deficiency     Past Surgical History:  Procedure Laterality Date   CARDIAC CATHETERIZATION  2006    carpel tunnel     GALLBLADDER SURGERY     IVC FILTER INSERTION N/A 12/11/2020   Procedure: IVC FILTER INSERTION;  Surgeon: Algernon Huxley, MD;  Location: [facility];  Service: Cardiovascular;  Laterality: N/A;   IVC FILTER REMOVAL N/A 04/19/2021   Procedure: IVC FILTER REMOVAL;  Surgeon: Algernon Huxley, MD;  Location: [facility];  Service: Cardiovascular;  Laterality: N/A;   LEFT HEART CATH AND CORONARY ANGIOGRAPHY Left 06/14/2019   Procedure: LEFT HEART CATH AND CORONARY ANGIOGRAPHY;  Surgeon: Wellington Hampshire, MD;  Location: [facility];  Service: Cardiovascular;  Laterality: Left;   PULMONARY THROMBECTOMY Bilateral 03/22/2020   Procedure: PULMONARY THROMBECTOMY / THROMBOLYSIS;  Surgeon: Algernon Huxley, MD;  Location: [facility];  Service: Cardiovascular;  Laterality: Bilateral;    Current Medications: Current Meds  Medication Sig   acetaminophen (TYLENOL) 325 MG tablet Take 2 tablets (650 mg total) by mouth every 6 (six) hours as needed for mild pain (or Fever >/= 101).   aspirin EC 81 MG tablet Take 81 mg by mouth daily. Swallow whole.   carvedilol (COREG) 3.125 MG tablet Take 1 tablet (3.125 mg total) by mouth 2 (two) times daily.   estradiol (ESTRACE) 0.1 MG/GM vaginal cream Place [id number] Applicatorfuls vaginally 3 (three) times a week. 1/3 applicator at bedtime every other night.   folic acid (FOLVITE) 1 MG tablet Take 1 mg by mouth daily.   methotrexate (RHEUMATREX) 2.5 MG tablet Take 15 mg  by mouth once a week.   metroNIDAZOLE (FLAGYL) 500 MG tablet Take 1 tablet (500 mg total) by mouth 2 (two) times daily. Begin 5 days prior to scheduled surgery as directed.   olmesartan-hydrochlorothiazide (BENICAR HCT) 20-12.5 MG tablet TAKE 1 TABLET BY MOUTH ONCE A DAY   Turmeric 500 MG CAPS Take by mouth.     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  Tobacco Use   Smoking status: Never   Smokeless tobacco: Never  Vaping Use   Vaping Use: Never used  Substance and Sexual Activity   Alcohol use: Yes    Comment: occass   Drug use: Never   Sexual activity: Not Currently  Other Topics Concern   Not on file  Social History Narrative   Lives at home with spouse   Social Determinants of Health   Financial Resource Strain: Low Risk    Difficulty of Paying Living Expenses: Not hard at all  Food Insecurity: No Food Insecurity   Worried About Charity fundraiser in the Last Year: Never true   Arboriculturist in the Last Year: Never true  Transportation Needs: No Transportation Needs   Lack of Transportation (Medical): No   Lack of Transportation (Non-Medical): No  Physical Activity: Sufficiently Active   Days of Exercise per Week: 5 days   Minutes of Exercise per Session: 30 min  Stress: No Stress Concern Present   Feeling of Stress : Not at all  Social Connections: Unknown   Frequency of Communication with Friends and Family: More than three times a week   Frequency of Social Gatherings with Friends and Family: More than three times a week   Attends Religious Services: Not on Electrical engineer or Organizations: Not on file   Attends Archivist Meetings: Not on file   Marital Status: Married     Family History: The patient's family history includes Asthma in her father and mother; Heart failure in her father; Leukemia in her sister. There is no history of Breast cancer.  ROS:   Please see the history of present illness.     All other systems reviewed and are negative.  EKGs/Labs/Other Studies Reviewed:    The following studies were reviewed today:  Echo 03/2020  1. Left ventricular ejection fraction, by estimation, is 60 to 65%. The  left  ventricle has normal function. Left ventricular endocardial border  not optimally defined to evaluate regional wall motion. Left ventricular  diastolic parameters are consistent  with Grade I diastolic dysfunction (impaired relaxation).   2. Right ventricular systolic function is low normal. The right  ventricular size is normal. There is normal pulmonary artery systolic  pressure.   3. The mitral valve is grossly normal. No evidence of mitral valve  regurgitation.   4. The aortic valve is tricuspid. Aortic valve regurgitation is not  visualized. No aortic stenosis is present.   5. The inferior vena cava is normal in size with greater than 50%  respiratory variability, suggesting right atrial pressure of 3 mmHg.   Heart monitor 07/2019 7-day Zio patch monitor:   Normal sinus rhythm with an average heart  rate of 73 bpm. 5 beat run of SVT. Rare PACs with no evidence of PVCs.   Overall, unremarkable monitor.  LHC 06/2019 The left ventricular systolic function is normal. LV end diastolic pressure is mildly elevated. The left ventricular ejection fraction is 55-65% by visual estimate.   1.  Normal coronary arteries. 2.  Normal LV systolic function.  Mildly elevated left ventricular end-diastolic pressure post frequent PVCs.   Recommendations: Noncardiac chest pain.    EKG:  EKG is  ordered today.  The ekg ordered today demonstrates NSR, 62bpm, nonspecific ST changes  Recent Labs: 08/18/2020: Hemoglobin 14.7; Platelets 224  Recent Lipid Panel No results found for: CHOL, TRIG, HDL, CHOLHDL, VLDL, LDLCALC, LDLDIRECT   Physical Exam:    VS:  BP 102/72 (BP Location: Left Arm, Patient Position: Sitting, Cuff Size: Normal)   Pulse 62   Ht '5\' 2"'$  (1.575 m)   Wt 203 lb (92.1 kg)   SpO2 97%   BMI 37.13 kg/m     Wt Readings from Last 3 Encounters:  07/05/21 203 lb (92.1 kg)  07/02/21 203 lb 4.8 oz (92.2 kg)  06/15/21 201 lb (91.2 kg)     GEN:  Well nourished, well developed in  no acute distress HEENT: Normal NECK: No JVD; No carotid bruits LYMPHATICS: No lymphadenopathy CARDIAC: RRR, no murmurs, rubs, gallops RESPIRATORY:  Clear to auscultation without rales, wheezing or rhonchi  ABDOMEN: Soft, non-tender, non-distended MUSCULOSKELETAL:  No edema; No deformity  SKIN: Warm and dry NEUROLOGIC:  Alert and oriented x 3 PSYCHIATRIC:  Normal affect   ASSESSMENT:    1. Preoperative cardiovascular examination   2. Atypical chest pain   3. PVC (premature ventricular contraction)    PLAN:    In order of problems listed above:  Preoperative cardiac evaluation Patient is scheduled for hysterectomy August 22nd. Patient is stable from a cardiac perspective. No chest pain, shortness of breath, lower leg edema, palpitations. BP and heart rate good. EKG with NSR with nonspecific ST changes. Patient is very active and has no anginal symptoms with this. Recent labs reviewed. Last echo 07/2020 showed normal LVEF, no WMA, normal diastolic parameters. [place] 2020 showed normal coronary areries. No indication for further cardiac work-up prior to surgery. Discussed prior DVT for which she follows with Dr. Lucky Cowboy. IVC filter was removed in May 2022, and she switched from Xarelto to Aspirin in May. OF note, it was felt DVT was not correlated to surgery, so she will not need a/c ppx. Plan to hold Aspirin 1 week prior to surgery. METS>4. According to the Revised cardiac index the patient is at Class II risk, 6% for 30 day risk of death, MI or cardiac arrest. Patient would be at an acceptable risk for the planned procedure without further cardiovascular testing.   Disposition: Follow up in 1 year(s) with Md/APP    Signed, Ashiya Kinkead Ninfa Meeker, PA-C  07/05/2021 4:31 PM    [facility]

## 2021-07-05 NOTE — Patient Instructions (Signed)
Medication Instructions:  No changes at this time.  *If you need a refill on your cardiac medications before your next appointment, please call your pharmacy*   Lab Work: None  If you have labs (blood work) drawn today and your tests are completely normal, you will receive your results only by: [place] (if you have MyChart) OR A paper copy in the mail If you have any lab test that is abnormal or we need to change your treatment, we will call you to review the results.   Testing/Procedures: None   Follow-Up: At [facility], you and your health needs are our priority.  As part of our continuing mission to provide you with exceptional heart care, we have created designated Provider Care Teams.  These Care Teams include your primary Cardiologist (physician) and Advanced Practice Providers (APPs -  Physician Assistants and Nurse Practitioners) who all work together to provide you with the care you need, when you need it.  We recommend signing up for the patient portal called "MyChart".  Sign up information is provided on this After Visit Summary.  MyChart is used to connect with patients for Virtual Visits (Telemedicine).  Patients are able to view lab/test results, encounter notes, upcoming appointments, etc.  Non-urgent messages can be sent to your provider as well.   To learn more about what you can do with MyChart, go to [email or web address].    Your next appointment:   1 year(s)  The format for your next appointment:   In Person  Provider:   You may see [name], MD or one of the following Advanced Practice Providers on your designated Care Team:   Murray Hodgkins, NP Christell Faith, PA-C Marrianne Mood, PA-C Cadence Tabiona, [place]

## 2021-07-09 ENCOUNTER — Ambulatory Visit (INDEPENDENT_AMBULATORY_CARE_PROVIDER_SITE_OTHER): Payer: Medicare Other | Admitting: Family Medicine

## 2021-07-09 ENCOUNTER — Encounter: Payer: Self-pay | Admitting: Family Medicine

## 2021-07-09 ENCOUNTER — Other Ambulatory Visit: Payer: Self-pay

## 2021-07-09 DIAGNOSIS — Z86711 Personal history of pulmonary embolism: Secondary | ICD-10-CM

## 2021-07-09 DIAGNOSIS — I10 Essential (primary) hypertension: Secondary | ICD-10-CM

## 2021-07-09 DIAGNOSIS — Z01818 Encounter for other preprocedural examination: Secondary | ICD-10-CM | POA: Diagnosis not present

## 2021-07-09 DIAGNOSIS — R7303 Prediabetes: Secondary | ICD-10-CM

## 2021-07-09 DIAGNOSIS — R918 Other nonspecific abnormal finding of lung field: Secondary | ICD-10-CM

## 2021-07-09 NOTE — Assessment & Plan Note (Signed)
Preoperative exam completed today.  Lab work as outlined.  We will determine if she can proceed with surgery once her labs return.

## 2021-07-09 NOTE — Assessment & Plan Note (Signed)
Check A1c. 

## 2021-07-09 NOTE — Assessment & Plan Note (Addendum)
Well-controlled.  She will continue carvedilol 3.125 mg twice daily and Benicar HCT 1 tablet once daily.  Check BMP and lipid panel.

## 2021-07-09 NOTE — Assessment & Plan Note (Signed)
Stable lung nodules.  She is low risk.  Discussed no further follow-up.

## 2021-07-09 NOTE — Patient Instructions (Signed)
Nice to see you. We will get lab work today and contact you with the results. 

## 2021-07-09 NOTE — Assessment & Plan Note (Signed)
She will need clearance from vascular surgeon prior to her upcoming hysterectomy.

## 2021-07-09 NOTE — Progress Notes (Signed)
[name], MD Phone: [phone]  Cindy Stein is a 65 y.o. female who presents today for f/u.  Preoperative clearance: Patient presents for preoperative clearance for vaginal hysterectomy.  This is for medical clearance as she has already seen cardiology.  She notes no issues with shortness of breath or chest pain with climbing 2 flights of stairs.  No history of kidney disease, heart attack, irregular heartbeat, stroke, issues with anesthesia, seizures, thyroid disease, angina, liver disease, heart failure, asthma or bronchitis, or diabetes.  They are awaiting clearance from her vascular surgeon as well given her history of a PE.  She is no longer on Xarelto for that.  Hypertension: Blood pressure is typically similar to today.  She takes carvedilol, olmesartan, and HCTZ.  No chest pain or shortness of breath.  Lung nodules: Prior CT imaging revealed stability of these.  She notes no family history of lung cancer.  No history of smoking.  Social History   Tobacco Use  Smoking Status Never  Smokeless Tobacco Never    Current Outpatient Medications on File Prior to Visit  Medication Sig Dispense Refill   acetaminophen (TYLENOL) 325 MG tablet Take 2 tablets (650 mg total) by mouth every 6 (six) hours as needed for mild pain (or Fever >/= 101).     albuterol (VENTOLIN HFA) 108 (90 Base) MCG/ACT inhaler Inhale 2 puffs into the lungs every 6 (six) hours as needed for wheezing or shortness of breath. (Patient not taking: Reported on 07/05/2021) 8 g 2   aspirin EC 81 MG tablet Take 81 mg by mouth daily. Swallow whole.     carvedilol (COREG) 3.125 MG tablet Take 1 tablet (3.125 mg total) by mouth 2 (two) times daily. 180 tablet 1   estradiol (ESTRACE) 0.1 MG/GM vaginal cream Place [id number] Applicatorfuls vaginally 3 (three) times a week. 1/3 applicator at bedtime every other night. 90 g 3   folic acid (FOLVITE) 1 MG tablet Take 1 mg by mouth daily.     methotrexate (RHEUMATREX) 2.5 MG  tablet Take 15 mg by mouth once a week.     metroNIDAZOLE (FLAGYL) 500 MG tablet Take 1 tablet (500 mg total) by mouth 2 (two) times daily. Begin 5 days prior to scheduled surgery as directed. 10 tablet 0   olmesartan-hydrochlorothiazide (BENICAR HCT) 20-12.5 MG tablet TAKE 1 TABLET BY MOUTH ONCE A DAY 90 tablet 1   Turmeric 500 MG CAPS Take by mouth.     No current facility-administered medications on file prior to visit.     ROS see history of present illness  Objective  Physical Exam Vitals:   07/09/21 1547  BP: 120/80  Pulse: 81  Temp: 98.7 F (37.1 C)  SpO2: 99%    BP Readings from Last 3 Encounters:  07/09/21 120/80  07/05/21 102/72  07/02/21 139/76   Wt Readings from Last 3 Encounters:  07/09/21 203 lb 6.4 oz (92.3 kg)  07/05/21 203 lb (92.1 kg)  07/02/21 203 lb 4.8 oz (92.2 kg)    Physical Exam Constitutional:      General: She is not in acute distress.    Appearance: She is not diaphoretic.  HENT:     Head: Normocephalic and atraumatic.  Cardiovascular:     Rate and Rhythm: Normal rate and regular rhythm.     Heart sounds: Normal heart sounds.  Pulmonary:     Effort: Pulmonary effort is normal.     Breath sounds: Normal breath sounds.  Abdominal:     General: Bowel  sounds are normal. There is no distension.     Palpations: Abdomen is soft.     Tenderness: There is no abdominal tenderness. There is no guarding or rebound.  Musculoskeletal:     Right lower leg: No edema.     Left lower leg: No edema.  Skin:    General: Skin is warm and dry.  Neurological:     Mental Status: She is alert.     Assessment/Plan: Please see individual problem list.  Problem List Items Addressed This Visit     History of pulmonary embolus (PE)    She will need clearance from vascular surgeon prior to her upcoming hysterectomy.       Hypertension    Well-controlled.  She will continue carvedilol 3.125 mg twice daily and Benicar HCT 1 tablet once daily.  Check BMP  and lipid panel.       Relevant Orders   Basic Metabolic Panel (BMET)   Lipid panel   Prediabetes    Check A1c.       Relevant Orders   HgB A1c   Preoperative evaluation to rule out surgical contraindication    Preoperative exam completed today.  Lab work as outlined.  We will determine if she can proceed with surgery once her labs return.       Pulmonary nodules    Stable lung nodules.  She is low risk.  Discussed no further follow-up.        Return in about 6 months (around 01/09/2022).  This visit occurred during the SARS-CoV-2 public health emergency.  Safety protocols were in place, including screening questions prior to the visit, additional usage of staff PPE, and extensive cleaning of exam r who died who died oom while observing appropriate contact time as indicated for disinfecting solutions.    [name], MD [place]

## 2021-07-10 LAB — BASIC METABOLIC PANEL
BUN: 19 mg/dL (ref 6–23)
CO2: 30 mEq/L (ref 19–32)
Calcium: 9.6 mg/dL (ref 8.4–10.5)
Chloride: 104 mEq/L (ref 96–112)
Creatinine, Ser: 0.96 mg/dL (ref 0.40–1.20)
GFR: 61.26 mL/min (ref >60.00)
Glucose, Bld: 107 mg/dL — ABNORMAL HIGH (ref 70–99)
Potassium: 3.9 mEq/L (ref 3.5–5.1)
Sodium: 141 mEq/L (ref 135–145)

## 2021-07-10 LAB — HEMOGLOBIN A1C: Hgb A1c MFr Bld: 6.3 % (ref 4.6–6.5)

## 2021-07-10 LAB — LIPID PANEL
Cholesterol: 172 mg/dL (ref 0–200)
HDL: 59.6 mg/dL (ref >39.00)
NonHDL: 112.21
Total CHOL/HDL Ratio: 3
Triglycerides: 215 mg/dL — ABNORMAL HIGH (ref 0.0–149.0)
VLDL: 43 mg/dL — ABNORMAL HIGH (ref 0.0–40.0)

## 2021-07-10 LAB — LDL CHOLESTEROL, DIRECT: Direct LDL: 85 mg/dL

## 2021-07-13 ENCOUNTER — Other Ambulatory Visit: Payer: Self-pay

## 2021-07-13 ENCOUNTER — Encounter
Admission: RE | Admit: 2021-07-13 | Discharge: 2021-07-13 | Disposition: A | Discharge status: Home / Self Care | Payer: Medicare Other | Source: Ambulatory Visit | Attending: Obstetrics and Gynecology | Admitting: Obstetrics and Gynecology

## 2021-07-13 NOTE — Patient Instructions (Addendum)
Your procedure is scheduled on: July 23, 2021 MONDAY Report to the Registration Desk on the 1st floor of the [facility]. To find out your arrival time, please call [phone] between 1PM - 3PM on: Friday July 20, 2021  REMEMBER: Instructions that are not followed completely may result in serious medical risk, up to and including death; or upon the discretion of your surgeon and anesthesiologist your surgery may need to be rescheduled.  Do not eat food after midnight the night before surgery.  No gum chewing, lozengers or hard candies.  You may however, drink CLEAR liquids up to 2 hours before you are scheduled to arrive for your surgery. Do not drink anything within 2 hours of your scheduled arrival time.  Clear liquids include: - water  - apple juice without pulp - gatorade (not RED, PURPLE, OR BLUE) - black coffee or tea (Do NOT add milk or creamers to the coffee or tea) Do NOT drink anything that is not on this list.  TAKE THESE MEDICATIONS THE MORNING OF SURGERY WITH A SIP OF WATER: CARVEDILOL  STOP ASPIRIN 1 WEEK BEFORE SURGERY. LAST DOSE OF ASPIRIN 07/15/2021 SUNDAY  One week prior to surgery: Stop Anti-inflammatories (NSAIDS) such as Advil, Aleve, Ibuprofen, Motrin, Naproxen, Naprosyn and Aspirin based products such as Excedrin, Goodys Powder, BC Powder. Stop ANY OVER THE COUNTER supplements until after surgery.  No Alcohol for 24 hours before or after surgery.  No Smoking including e-cigarettes for 24 hours prior to surgery.  No chewable tobacco products for at least 6 hours prior to surgery.  No nicotine patches on the day of surgery.  Do not use any "recreational" drugs for at least a week prior to your surgery.  Please be advised that the combination of cocaine and anesthesia may have negative outcomes, up to and including death. If you test positive for cocaine, your surgery will be cancelled.  On the morning of surgery brush your teeth with toothpaste and  water, you may rinse your mouth with mouthwash if you wish. Do not swallow any toothpaste or mouthwash.  Do not wear jewelry, make-up, hairpins, clips or nail polish.  Do not wear lotions, powders, or perfumes OR DEODORANT   Do not shave body from the neck down 48 hours prior to surgery just in case you cut yourself which could leave a site for infection.  Also, freshly shaved skin may become irritated if using the CHG soap.  Contact lenses, hearing aids and dentures may not be worn into surgery.  Do not bring valuables to the hospital. [facility] is not responsible for any missing/lost belongings or valuables.   Use CHG Soap on instruction sheet.  Notify your doctor if there is any change in your medical condition (cold, fever, infection).  Wear comfortable clothing (specific to your surgery type) to the hospital.  After surgery, you can help prevent lung complications by doing breathing exercises.  Take deep breaths and cough every 1-2 hours. Your doctor may order a device called an Incentive Spirometer to help you take deep breaths. When coughing or sneezing, hold a pillow firmly against your incision with both hands. This is called "splinting." Doing this helps protect your incision. It also decreases belly discomfort.  If you are being discharged the day of surgery, you will not be allowed to drive home. You will need a responsible adult (18 years or older) to drive you home and stay with you that night.   If you are taking public transportation,  you will need to have a responsible adult (18 years or older) with you. Please confirm with your physician that it is acceptable to use public transportation.   Please call the [place] [facility] at [phone] if you have any questions about these instructions.  Surgery Visitation Policy:  Patients undergoing a surgery or procedure may have one family member or support person with them as long as that person is not  COVID-19 positive or experiencing its symptoms.  That person may remain in the waiting area during the procedure.  Inpatient Visitation:    Visiting hours are 7 a.m. to 8 p.m. Inpatients will be allowed two visitors daily. The visitors may change each day during the patient's stay. No visitors under the age of 78. Any visitor under the age of 82 must be accompanied by an adult. The visitor must pass COVID-19 screenings, use hand sanitizer when entering and exiting the patient's room and wear a mask at all times, including in the patient's room. Patients must also wear a mask when staff or their visitor are in the room. Masking is required regardless of vaccination status.

## 2021-07-16 ENCOUNTER — Telehealth: Payer: Self-pay

## 2021-07-17 ENCOUNTER — Encounter: Payer: Self-pay | Admitting: Urgent Care

## 2021-07-17 ENCOUNTER — Encounter
Admission: RE | Admit: 2021-07-17 | Discharge: 2021-07-17 | Disposition: A | Discharge status: Home / Self Care | Payer: Medicare Other | Source: Ambulatory Visit | Attending: Obstetrics and Gynecology | Admitting: Obstetrics and Gynecology

## 2021-07-17 ENCOUNTER — Other Ambulatory Visit: Payer: Self-pay

## 2021-07-17 DIAGNOSIS — N813 Complete uterovaginal prolapse: Secondary | ICD-10-CM | POA: Diagnosis not present

## 2021-07-17 DIAGNOSIS — Z01812 Encounter for preprocedural laboratory examination: Secondary | ICD-10-CM | POA: Insufficient documentation

## 2021-07-17 DIAGNOSIS — Z7982 Long term (current) use of aspirin: Secondary | ICD-10-CM | POA: Insufficient documentation

## 2021-07-17 DIAGNOSIS — Z79899 Other long term (current) drug therapy: Secondary | ICD-10-CM | POA: Insufficient documentation

## 2021-07-17 LAB — BASIC METABOLIC PANEL
Anion gap: 9 (ref 5–15)
BUN: 22 mg/dL (ref 8–23)
CO2: 26 mmol/L (ref 22–32)
Calcium: 9.3 mg/dL (ref 8.9–10.3)
Chloride: 103 mmol/L (ref 98–111)
Creatinine, Ser: 0.86 mg/dL (ref 0.44–1.00)
GFR, Estimated: >60 mL/min (ref >60)
Glucose, Bld: 136 mg/dL — ABNORMAL HIGH (ref 70–99)
Potassium: 3.7 mmol/L (ref 3.5–5.1)
Sodium: 138 mmol/L (ref 135–145)

## 2021-07-17 LAB — CBC
HCT: 40.3 % (ref 36.0–46.0)
Hemoglobin: 13.8 g/dL (ref 12.0–15.0)
MCH: 30.4 pg (ref 26.0–34.0)
MCHC: 34.2 g/dL (ref 30.0–36.0)
MCV: 88.8 fL (ref 80.0–100.0)
Platelets: 212 10*3/uL (ref 150–400)
RBC: 4.54 MIL/uL (ref 3.87–5.11)
RDW: 14.1 % (ref 11.5–15.5)
WBC: 7.6 10*3/uL (ref 4.0–10.5)
nRBC: 0 % (ref 0.0–0.2)

## 2021-07-17 LAB — TYPE AND SCREEN
ABO/RH(D): B POS
Antibody Screen: NEGATIVE

## 2021-07-17 NOTE — Telephone Encounter (Signed)
Clearance form signed. Please fax.

## 2021-07-18 ENCOUNTER — Encounter: Payer: Self-pay | Admitting: Obstetrics and Gynecology

## 2021-07-18 NOTE — Telephone Encounter (Signed)
Clearance form  was faxed to have hysterectomy, fax confirmation given.  Cindy Stein,cma

## 2021-07-18 NOTE — Progress Notes (Addendum)
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 Clinical nursing staff has updated patient's PMH/PSHx, current medication list, and drug allergies/intolerances to ensure comprehensive history available to assist in medical decision making as it pertains to the aforementioned surgical procedure and anticipated anesthetic course. Extensive review of available clinical information performed. [place] PMH and PSHx updated with any diagnoses/procedures that  may have been inadvertently omitted during her intake with the pre-admission testing department's nursing staff.  Clinical Discussion:  Cindy Stein is a 65 y.o. female who is submitted for pre-surgical anesthesia review and clearance prior to her undergoing the above procedure. Patient has never been a smoker. Pertinent PMH includes: PSVT, PVCs, atypical chest pain, G1DD, VTE, HTN, HLD, DOE, RA, OA, long-term immunosuppressive therapy (MTX for RA), anxiety.  Patient is followed by cardiology Fletcher Anon, MD). She was last seen in the cardiology clinic on 07/05/2021. notes reviewed.  At the time of her clinic visit, patient doing well overall from a cardiovascular perspective.  She denied any episodes of chest pain, short of breath, PND, orthopnea, palpitations, significant peripheral edema, vertiginous symptoms, or presyncope/syncope.  PMH  significant for cardiovascular diagnoses.  Diagnostic left heart catheterization performed on 06/14/2019 revealed normal left ventricular systolic function with an EF of 55 to 65%.  Coronary arteries were normal.  LVEDP mildly elevated.  Frequent PVCs noted.  Long-term cardiac event monitor study performed on 07/22/2019 revealed a predominant underlying sinus rhythm with a minimum heart rate of 45 bpm, maximum heart rate of 138 bpm, and average heart rate of 73 bpm.  There was 1 run of SVT lasting 5 beats at a maximum rate of 138 bpm.  TTE performed on 03/21/2020 revealed normal left ventricular systolic function with an EF of 60-65%.  Doppler parameters consistent with impaired relaxation (G1DD).  PASP normal.  There was no evidence of valvular insufficiency or stenosis.  Repeat TTE performed on 07/07/2020 remain grossly unchanged.  Patient seen in the ED on 03/20/2020 at which time she was diagnosed with LEFT lower extremity DVT (femoral, popliteal, posterior tibial, and peroneal veins). Patient was started on rivaroxaban and discharged home. Venous thrombosis embolized on 03/22/2019 prompting return to the ED. CT imaging revealed BILATERAL pulmonary emboli.  Patient underwent thrombolysis and mechanical thrombectomy on 03/22/2020.  Patient underwent IVC filter placement prior to knee surgery on 12/11/2020.  IVC filter was subsequently removed on 04/19/2021.  Additionally, rivaroxaban was discontinued in 04/2021.  Blood pressure well controlled at 102/72 on currently prescribed beta-blocker, ARB, and diuretic therapies.  Patient is not on any type of lipid-lowering therapy for ASCVD prevention.  She is not diabetic.  Functional capacity, as defined by DASI, is documented as being >/= 4 METS.  ECG in the clinic revealed NSR with nonspecific ST changes at a rate of 62 bpm.  No changes were made to her medication regimen.  Patient to follow-up with outpatient  cardiology in 1 year or sooner if  needed.  Patient scheduled to undergo a vaginal hysterectomy on 07/23/2021 with Dr. Jeannie Fend, MD.  Given patient's past medical history significant for cardiovascular diagnoses, presurgical clearances were sought from internal/family medicine, vascular surgery, and cardiology.  Specialty clearances were obtained as follows:   Per internal medicine [name], MD), "this patient is optimized for surgery from a medicine standpoint.  She will need clearance from Dr. Bunnie Domino office (vascular surgery) as well.  Patient may proceed with the planned procedural course at an overall LOW risk of significant complications".  Verbally from vascular surgery Owens Shark, NP-C), "this patient is optimized for surgery and may proceed with the planned procedural course with a LOW risk of significant perioperative complications".  Per cardiology Kathlen Mody, PA-C), "patient is stable from a cardiac perspective.  No chest pain, short of breath, lower extremity edema, or palpitations.  BP and heart rate good.  EKG normal.  Patient is very active and has no anginal symptoms.  RCRI class II risk, which equates to a 6% 30-day risk of death, MI, or cardiac arrest.  Patient would be at an ACCEPTABLE risk for the planned procedure without further cardiovascular testing".  This patient is currently taking daily antiplatelet therapy.  She has been instructed on recommendations for holding her daily low-dose ASA for 1 week prior to surgery with plans to restart as soon as postoperative bleeding risk felt to be minimized by her primary attending surgeon.  The patient is aware that her last dose of ASA will be on 07/15/2021.  Patient denies previous perioperative complications with anesthesia in the past.  In review of her EMR, there are no records available for review regarding past procedural/anesthetic courses within the [facility] system.  Vitals with BMI 07/13/2021 07/09/2021 07/05/2021  Height '5\' 2"'$  - '5\' 2"'$   Weight 203 lbs 203 lbs 6 oz  203 lbs  BMI 37.12 [id number]  Systolic - [id number]  Diastolic - 80 72  Pulse - 81 62    Providers/Specialists:   NOTE: Primary physician provider listed below. Patient may have been seen by APP or partner within same practice.   PROVIDER ROLE / SPECIALTY LAST Alferd Apa, MD OB/GYN (Surgeon) 07/02/2021  Leone Haven, MD Primary Care Provider 07/09/2021  [name], MD Cardiology 07/05/2021  [name], MD Vascular surgery 05/29/2021   Allergies:  Acetaminophen and Tramadol  Current Home Medications:   No current facility-administered medications for this encounter.    aspirin EC 81 MG tablet   carvedilol (COREG) 3.125 MG tablet   estradiol (ESTRACE) 0.1 MG/GM vaginal cream   folic acid (FOLVITE) 1 MG tablet   methotrexate (RHEUMATREX) 2.5 MG tablet   naproxen sodium (ALEVE) 220 MG tablet   olmesartan-hydrochlorothiazide (BENICAR HCT) 20-12.5 MG tablet   albuterol (VENTOLIN HFA) 108 (90 Base) MCG/ACT inhaler   metroNIDAZOLE (FLAGYL) 500 MG tablet   History:   Past Medical History:  Diagnosis Date   Anxiety    Atypical chest pain    DOE (dyspnea on exertion)    DVT of lower extremity (deep venous thrombosis) ([place]) 03/20/2020   a.) LEFT femoral, popliteal, posterior tibial, and paroneal veins; Tx'd with Rivaroxaban; b.) embolized on 03/21/2020 requiring PA thrombolysis and mechanical thrombectomy on 03/22/2020   Grade I diastolic dysfunction [id number]   History of immunosuppression    long-term MTX for RA diagnosis   History of PSVT (paroxysmal supraventricular tachycardia) 07/22/2019   a.) brief 5 beat run noted  on Zio   HLD (hyperlipidemia)    Hypertension    Osteoarthritis    Plantar fascial fibromatosis    Pulmonary embolus ([place]) 03/21/2020   a.) BILATERAL; required PA thrombolysis and mechanical thrombectomy on 03/22/2020; b.) IVC filter placed prior to knee surgery on 12/12/2020 and removed on 04/19/2021   PVC (premature ventricular  contraction)    Rheumatoid arthritis (HCC)    Valgus deformity of great toes, bilateral    Vitamin D deficiency    Past Surgical History:  Procedure Laterality Date   CARDIAC CATHETERIZATION  2006   carpel tunnel Bilateral    CATARACT EXTRACTION, BILATERAL     CHOLECYSTECTOMY N/A 2006   IVC FILTER INSERTION N/A 12/11/2020   Procedure: IVC FILTER INSERTION;  Surgeon: Algernon Huxley, MD;  Location: [place] [facility];  Service: Cardiovascular;  Laterality: N/A;   IVC FILTER REMOVAL N/A 04/19/2021   Procedure: IVC FILTER REMOVAL;  Surgeon: Algernon Huxley, MD;  Location: [place] [facility];  Service: Cardiovascular;  Laterality: N/A;   JOINT REPLACEMENT Right 2021   KNEE   LEFT HEART CATH AND CORONARY ANGIOGRAPHY Left 06/14/2019   Procedure: LEFT HEART CATH AND CORONARY ANGIOGRAPHY;  Surgeon: Wellington Hampshire, MD;  Location: [place] [facility];  Service: Cardiovascular;  Laterality: Left;   PULMONARY THROMBECTOMY Bilateral 03/22/2020   Procedure: PULMONARY THROMBECTOMY / THROMBOLYSIS;  Surgeon: Algernon Huxley, MD;  Location: [place] [facility];  Service: Cardiovascular;  Laterality: Bilateral;   REPLACEMENT TOTAL KNEE Left 2022   Family History  Problem Relation Age of Onset   Asthma Mother    Asthma Father    Heart failure Father    Leukemia Sister    Breast cancer Neg Hx    Social History   Tobacco Use   Smoking status: Never   Smokeless tobacco: Never  Vaping Use   Vaping Use: Never used  Substance Use Topics   Alcohol use: Yes    Comment: OCCAS   Drug use: Never    Pertinent Clinical Results:  LABS: Labs reviewed: Acceptable for surgery.  Hospital Outpatient Visit on 07/19/2021  Component Date Value Ref Range Status   SARS Coronavirus 2 07/19/2021 NEGATIVE  NEGATIVE Final   Comment: (NOTE) SARS-CoV-2 target nucleic acids are NOT DETECTED.  The SARS-CoV-2 RNA is generally detectable in upper and lower respiratory specimens during the acute phase of infection.  Negative results do not preclude SARS-CoV-2 infection, do not rule out co-infections with other pathogens, and should not be used as the sole basis for treatment or other patient management decisions. Negative results must be combined with clinical observations, patient history, and epidemiological information. The expected result is Negative.  Fact Sheet for Patients: SugarRoll.be  Fact Sheet for Healthcare Providers: https://www.woods-mathews.com/  This test is not yet approved or cleared by the [place] FDA and  has been authorized for detection and/or diagnosis of SARS-CoV-2 by FDA under an Emergency Use Authorization (EUA). This EUA will remain  in effect (meaning this test can be used) for the duration of the COVID-19 declaration under Se                          ction 564(b)(1) of the Act, 21 U.S.C. section 360bbb-3(b)(1), unless the authorization is terminated or revoked sooner.  Performed at [facility], [place] [address], [place], [place] [address]     ECG: Date: 07/05/2021 Time ECG obtained: 1547 PM Rate: 62 bpm  Rhythm: normal sinus Axis (leads I and aVF): Normal Intervals: PR 170 ms. QRS 94 ms. QTc 410 ms. ST segment and T wave changes: Nonspecific ST abnormality Comparison: Similar to previous tracing obtained on 11/17/2020   IMAGING / PROCEDURES: TRANSTHORACIC ECHOCARDIOGRAM performed on 07/07/2020 Left ventricular ejection fraction, by estimation, is 60-65%.  The low ventricle has normal function.  Left ventricle has no regional wall motion abnormalities.  Left ventricular diastolic parameters were normal. Right ventricular systolic function is normal.  Right ventricular size is normal. Mitral valve is normal in structure.  No evidence of mitral valve regurgitation.  No evidence of mitral valve stenosis. The aortic valve is tricuspid.  Aortic valve regurgitation is not visualized.  No aortic stenosis  present.  TRANSTHORACIC ECHOCARDIOGRAM performed on 03/21/2020 Left ventricular ejection fraction, by estimation, is 60 to 65%.  Left ventricle has normal function.  Left ventricular endocardial border not optimally defined to evaluate regional wall motion.  Left ventricular diastolic parameters are consistent with grade 1 diastolic dysfunction (impaired relaxation). Right ventricular systolic function is low normal.  The right ventricular size is normal.  There is normal PASP. Mitral valve is grossly normal.  No evidence of mitral valve regurgitation. Aortic valve is tricuspid.  Aortic valve regurgitation is not visualized.  No aortic stenosis is present. The inferior vena cava is normal in size with greater than 50% respiratory variability, suggesting right atrial pressure of 3 mmHg.  LONG TERM CARDIAC EVENT MONITOR STUDY performed on 07/22/2019 Predominant underlying sinus rhythm Minimum heart rate 45 bpm, maximum heart rate 138 bpm, and average heart rate of 73 bpm. 1 run of SVT lasting 5 beats with a maximum rate of 138 bpm (average 129 bpm). Isolated SVE's and SVE triplets were rare (<1.0%).  No SVE couplets were present. No isolated VE's, VE couplets, or VE triplets were present.  LEFT HEART CATHETERIZATION AND CORONARY ANGIOGRAPHY performed on 06/14/2019 LVEF 55 to 65% Left ventricular systolic function normal LVEDP mildly elevated Normal coronary arteries Frequent PVCs  Impression and Plan:  Donda Demoret has been referred for pre-anesthesia review and clearance prior to her undergoing the planned anesthetic and procedural courses. Available labs, pertinent testing, and imaging results were personally reviewed by me. This patient has been appropriately cleared by internal/family medicine (LOW), cardiology (ACCEPTABLE), and vascular surgery (LOW) with the individually indicated risk of significant perioperative complications.  Based on clinical review performed today (07/20/21),  barring any significant acute changes in the patient's overall condition, it is anticipated that she will be able to proceed with the planned surgical intervention. Any acute changes in clinical condition may necessitate her procedure being postponed and/or cancelled. Patient will meet with anesthesia team (MD and/or CRNA) on the day of her procedure for preoperative evaluation/assessment. Questions regarding anesthetic course will be fielded at that time.   Pre-surgical instructions were reviewed with the patient during her PAT appointment and questions were fielded by PAT clinical staff. Patient was advised that if any questions or concerns arise prior to her procedure then she should return a call to PAT and/or her surgeon's office to discuss.  Honor Loh, MSN, APRN, FNP-C, CEN [facility]  Peri-operative Services Nurse Practitioner Phone: [phone] Fax: [phone] 07/20/21 8:42 AM  NOTE: This note has been prepared using Dragon dictation software. Despite my best ability to proofread, there is always the potential that unintentional transcriptional errors may still occur from this process.

## 2021-07-19 ENCOUNTER — Other Ambulatory Visit
Admission: RE | Admit: 2021-07-19 | Discharge: 2021-07-19 | Disposition: A | Discharge status: Home / Self Care | Payer: Medicare Other | Source: Ambulatory Visit | Attending: Obstetrics and Gynecology | Admitting: Obstetrics and Gynecology

## 2021-07-19 ENCOUNTER — Other Ambulatory Visit: Payer: Self-pay

## 2021-07-19 DIAGNOSIS — Z01812 Encounter for preprocedural laboratory examination: Secondary | ICD-10-CM | POA: Diagnosis not present

## 2021-07-19 DIAGNOSIS — Z20822 Contact with and (suspected) exposure to covid-19: Secondary | ICD-10-CM | POA: Diagnosis not present

## 2021-07-19 LAB — SARS CORONAVIRUS 2 (TAT 6-24 HRS): SARS Coronavirus 2: NEGATIVE

## 2021-07-23 ENCOUNTER — Inpatient Hospital Stay: Payer: Medicare Other | Admitting: Urgent Care

## 2021-07-23 ENCOUNTER — Encounter
Admission: RE | Disposition: A | Discharge status: Home / Self Care | Payer: Self-pay | Source: Home / Self Care | Attending: Obstetrics and Gynecology

## 2021-07-23 ENCOUNTER — Encounter: Payer: Self-pay | Admitting: Obstetrics and Gynecology

## 2021-07-23 ENCOUNTER — Observation Stay
Admission: RE | Admit: 2021-07-23 | Discharge: 2021-07-25 | Disposition: A | Discharge status: Home / Self Care | Payer: Medicare Other | Attending: Obstetrics and Gynecology | Admitting: Obstetrics and Gynecology

## 2021-07-23 ENCOUNTER — Other Ambulatory Visit: Payer: Self-pay

## 2021-07-23 DIAGNOSIS — N813 Complete uterovaginal prolapse: Secondary | ICD-10-CM | POA: Diagnosis present

## 2021-07-23 DIAGNOSIS — N814 Uterovaginal prolapse, unspecified: Secondary | ICD-10-CM

## 2021-07-23 DIAGNOSIS — Z79899 Other long term (current) drug therapy: Secondary | ICD-10-CM | POA: Diagnosis not present

## 2021-07-23 DIAGNOSIS — I10 Essential (primary) hypertension: Secondary | ICD-10-CM | POA: Insufficient documentation

## 2021-07-23 DIAGNOSIS — N888 Other specified noninflammatory disorders of cervix uteri: Secondary | ICD-10-CM | POA: Insufficient documentation

## 2021-07-23 DIAGNOSIS — N393 Stress incontinence (female) (male): Secondary | ICD-10-CM | POA: Diagnosis not present

## 2021-07-23 DIAGNOSIS — Z7982 Long term (current) use of aspirin: Secondary | ICD-10-CM | POA: Insufficient documentation

## 2021-07-23 DIAGNOSIS — D251 Intramural leiomyoma of uterus: Secondary | ICD-10-CM | POA: Insufficient documentation

## 2021-07-23 DIAGNOSIS — Z9889 Other specified postprocedural states: Secondary | ICD-10-CM

## 2021-07-23 DIAGNOSIS — N840 Polyp of corpus uteri: Secondary | ICD-10-CM | POA: Insufficient documentation

## 2021-07-23 HISTORY — DX: Rheumatoid arthritis, unspecified: M06.9

## 2021-07-23 HISTORY — DX: Hallux valgus (acquired), right foot: M20.11

## 2021-07-23 HISTORY — DX: Other chest pain: R07.89

## 2021-07-23 HISTORY — DX: Other forms of dyspnea: R06.09

## 2021-07-23 HISTORY — DX: Hyperlipidemia, unspecified: E78.5

## 2021-07-23 HISTORY — PX: VAGINAL HYSTERECTOMY: SHX2639

## 2021-07-23 HISTORY — DX: Anxiety disorder, unspecified: F41.9

## 2021-07-23 HISTORY — DX: Unspecified osteoarthritis, unspecified site: M19.90

## 2021-07-23 HISTORY — DX: Dyspnea, unspecified: R06.00

## 2021-07-23 HISTORY — DX: Personal history of immunosuppression therapy: Z92.25

## 2021-07-23 HISTORY — PX: ANTERIOR AND POSTERIOR REPAIR: SHX5121

## 2021-07-23 HISTORY — PX: PUBOVAGINAL SLING: SHX1035

## 2021-07-23 HISTORY — DX: Hallux valgus (acquired), left foot: M20.12

## 2021-07-23 SURGERY — HYSTERECTOMY, VAGINAL
Anesthesia: General

## 2021-07-23 MED ORDER — IRBESARTAN 150 MG PO TABS
150.0000 mg | ORAL_TABLET | Freq: Every day | ORAL | Status: DC
  Administered 2021-07-25: 150 mg via ORAL
  Filled 2021-07-23 (×2): qty 1

## 2021-07-23 MED ORDER — ONDANSETRON HCL 4 MG/2ML IJ SOLN: 4.0000 mg | Freq: Four times a day (QID) | INTRAMUSCULAR | Status: DC | PRN

## 2021-07-23 MED ORDER — KETAMINE HCL 50 MG/5ML IJ SOSY
PREFILLED_SYRINGE | INTRAMUSCULAR | Status: AC
  Filled 2021-07-23: qty 5

## 2021-07-23 MED ORDER — DEXTROSE IN LACTATED RINGERS 5 % IV SOLN: INTRAVENOUS | Status: DC

## 2021-07-23 MED ORDER — ACETAMINOPHEN 10 MG/ML IV SOLN
INTRAVENOUS | Status: DC | PRN
  Administered 2021-07-23: 1000 mg via INTRAVENOUS

## 2021-07-23 MED ORDER — PROPOFOL 10 MG/ML IV BOLUS
INTRAVENOUS | Status: AC
  Filled 2021-07-23: qty 20

## 2021-07-23 MED ORDER — LIDOCAINE HCL (CARDIAC) PF 100 MG/5ML IV SOSY
PREFILLED_SYRINGE | INTRAVENOUS | Status: DC | PRN
  Administered 2021-07-23 (×2): 50 mg via INTRAVENOUS

## 2021-07-23 MED ORDER — OXYCODONE HCL 5 MG/5ML PO SOLN
5.0000 mg | Freq: Once | ORAL | Status: DC | PRN
Start: 2021-07-23 — End: 2021-07-23

## 2021-07-23 MED ORDER — LIDOCAINE-EPINEPHRINE 1 %-1:100000 IJ SOLN
INTRAMUSCULAR | Status: AC
  Filled 2021-07-23: qty 1

## 2021-07-23 MED ORDER — FENTANYL CITRATE (PF) 100 MCG/2ML IJ SOLN
INTRAMUSCULAR | Status: AC
  Filled 2021-07-23: qty 2

## 2021-07-23 MED ORDER — KETAMINE HCL 10 MG/ML IJ SOLN
INTRAMUSCULAR | Status: DC | PRN
  Administered 2021-07-23: 20 mg via INTRAVENOUS
  Administered 2021-07-23: 30 mg via INTRAVENOUS

## 2021-07-23 MED ORDER — HYDROCHLOROTHIAZIDE 12.5 MG PO CAPS
12.5000 mg | ORAL_CAPSULE | Freq: Every day | ORAL | Status: DC
  Administered 2021-07-25: 12.5 mg via ORAL
  Filled 2021-07-23 (×2): qty 1

## 2021-07-23 MED ORDER — FAMOTIDINE 20 MG PO TABS: 20.0000 mg | ORAL_TABLET | Freq: Once | ORAL | Status: AC

## 2021-07-23 MED ORDER — VASOPRESSIN 20 UNIT/ML IV SOLN
INTRAVENOUS | Status: AC
  Filled 2021-07-23: qty 1

## 2021-07-23 MED ORDER — OXYCODONE HCL 5 MG PO TABS: 5.0000 mg | ORAL_TABLET | Freq: Once | ORAL | Status: DC | PRN

## 2021-07-23 MED ORDER — CEFAZOLIN SODIUM-DEXTROSE 2-4 GM/100ML-% IV SOLN
2.0000 g | INTRAVENOUS | Status: AC
  Administered 2021-07-23: 2 g via INTRAVENOUS

## 2021-07-23 MED ORDER — HYDROMORPHONE HCL 1 MG/ML IJ SOLN
INTRAMUSCULAR | Status: DC | PRN
  Administered 2021-07-23 (×5): .2 mg via INTRAVENOUS

## 2021-07-23 MED ORDER — CHLORHEXIDINE GLUCONATE 0.12 % MT SOLN: 15.0000 mL | Freq: Once | OROMUCOSAL | Status: AC

## 2021-07-23 MED ORDER — CHLORHEXIDINE GLUCONATE 0.12 % MT SOLN
OROMUCOSAL | Status: AC
  Administered 2021-07-23: 15 mL via OROMUCOSAL
  Filled 2021-07-23: qty 15

## 2021-07-23 MED ORDER — KETOROLAC TROMETHAMINE 30 MG/ML IJ SOLN
INTRAMUSCULAR | Status: AC
  Administered 2021-07-23: 30 mg
  Filled 2021-07-23: qty 1

## 2021-07-23 MED ORDER — ONDANSETRON HCL 4 MG/2ML IJ SOLN
INTRAMUSCULAR | Status: DC | PRN
  Administered 2021-07-23: 4 mg via INTRAVENOUS

## 2021-07-23 MED ORDER — PROPOFOL 10 MG/ML IV BOLUS
INTRAVENOUS | Status: DC | PRN
  Administered 2021-07-23: 150 mg via INTRAVENOUS

## 2021-07-23 MED ORDER — FENTANYL CITRATE (PF) 100 MCG/2ML IJ SOLN
INTRAMUSCULAR | Status: DC | PRN
  Administered 2021-07-23 (×2): 50 ug via INTRAVENOUS

## 2021-07-23 MED ORDER — VASOPRESSIN 20 UNIT/ML IV SOLN
INTRAVENOUS | Status: DC | PRN
  Administered 2021-07-23: 21 mL via INTRAMUSCULAR
  Administered 2021-07-23: 5 mL via INTRAMUSCULAR
  Administered 2021-07-23: 8 mL via INTRAMUSCULAR

## 2021-07-23 MED ORDER — ESTROGENS, CONJUGATED 0.625 MG/GM VA CREA
TOPICAL_CREAM | VAGINAL | Status: AC
  Filled 2021-07-23: qty 30

## 2021-07-23 MED ORDER — ROCURONIUM BROMIDE 100 MG/10ML IV SOLN
INTRAVENOUS | Status: DC | PRN
  Administered 2021-07-23: 60 mg via INTRAVENOUS

## 2021-07-23 MED ORDER — DEXMEDETOMIDINE (PRECEDEX) IN NS 20 MCG/5ML (4 MCG/ML) IV SYRINGE
PREFILLED_SYRINGE | INTRAVENOUS | Status: DC | PRN
  Administered 2021-07-23: 8 ug via INTRAVENOUS

## 2021-07-23 MED ORDER — MIDAZOLAM HCL 2 MG/2ML IJ SOLN
INTRAMUSCULAR | Status: DC | PRN
  Administered 2021-07-23: 2 mg via INTRAVENOUS

## 2021-07-23 MED ORDER — FENTANYL CITRATE (PF) 100 MCG/2ML IJ SOLN
25.0000 ug | INTRAMUSCULAR | Status: DC | PRN
  Administered 2021-07-23: 25 ug via INTRAVENOUS

## 2021-07-23 MED ORDER — KETOROLAC TROMETHAMINE 30 MG/ML IJ SOLN
INTRAMUSCULAR | Status: DC | PRN
  Administered 2021-07-23: 30 mg via INTRAVENOUS

## 2021-07-23 MED ORDER — LACTATED RINGERS IV SOLN
INTRAVENOUS | Status: DC
Start: 2021-07-23 — End: 2021-07-23

## 2021-07-23 MED ORDER — DEXAMETHASONE SODIUM PHOSPHATE 10 MG/ML IJ SOLN
INTRAMUSCULAR | Status: DC | PRN
  Administered 2021-07-23: 10 mg via INTRAVENOUS

## 2021-07-23 MED ORDER — CEFAZOLIN SODIUM-DEXTROSE 2-4 GM/100ML-% IV SOLN
INTRAVENOUS | Status: AC
  Filled 2021-07-23: qty 100

## 2021-07-23 MED ORDER — MIDAZOLAM HCL 2 MG/2ML IJ SOLN
INTRAMUSCULAR | Status: AC
  Filled 2021-07-23: qty 2

## 2021-07-23 MED ORDER — SODIUM CHLORIDE (PF) 0.9 % IJ SOLN
INTRAMUSCULAR | Status: AC
  Filled 2021-07-23: qty 50

## 2021-07-23 MED ORDER — HYDROMORPHONE HCL 1 MG/ML IJ SOLN
INTRAMUSCULAR | Status: AC
  Filled 2021-07-23: qty 1

## 2021-07-23 MED ORDER — GLYCOPYRROLATE 0.2 MG/ML IJ SOLN
INTRAMUSCULAR | Status: DC | PRN
  Administered 2021-07-23: .2 mg via INTRAVENOUS

## 2021-07-23 MED ORDER — SIMETHICONE 80 MG PO CHEW
80.0000 mg | CHEWABLE_TABLET | Freq: Four times a day (QID) | ORAL | Status: DC | PRN
  Filled 2021-07-23: qty 1

## 2021-07-23 MED ORDER — ONDANSETRON HCL 4 MG PO TABS: 4.0000 mg | ORAL_TABLET | Freq: Four times a day (QID) | ORAL | Status: DC | PRN

## 2021-07-23 MED ORDER — SUGAMMADEX SODIUM 200 MG/2ML IV SOLN
INTRAVENOUS | Status: DC | PRN
  Administered 2021-07-23: 200 mg via INTRAVENOUS

## 2021-07-23 MED ORDER — ORAL CARE MOUTH RINSE: 15.0000 mL | Freq: Once | OROMUCOSAL | Status: AC

## 2021-07-23 MED ORDER — POVIDONE-IODINE 10 % EX SWAB: 2.0000 "application " | Freq: Once | CUTANEOUS | Status: DC

## 2021-07-23 MED ORDER — KETOROLAC TROMETHAMINE 30 MG/ML IJ SOLN: 30.0000 mg | Freq: Once | INTRAMUSCULAR | Status: DC

## 2021-07-23 MED ORDER — IBUPROFEN 600 MG PO TABS
600.0000 mg | ORAL_TABLET | Freq: Four times a day (QID) | ORAL | Status: DC
  Administered 2021-07-23 – 2021-07-24 (×2): 600 mg via ORAL
  Filled 2021-07-23 (×2): qty 1

## 2021-07-23 MED ORDER — OXYCODONE-ACETAMINOPHEN 5-325 MG PO TABS
1.0000 | ORAL_TABLET | ORAL | Status: DC | PRN
  Administered 2021-07-23: 2 via ORAL
  Filled 2021-07-23: qty 2

## 2021-07-23 MED ORDER — CARVEDILOL 3.125 MG PO TABS
3.1250 mg | ORAL_TABLET | Freq: Two times a day (BID) | ORAL | Status: DC
  Administered 2021-07-25: 3.125 mg via ORAL
  Filled 2021-07-23 (×4): qty 1

## 2021-07-23 MED ORDER — FAMOTIDINE 20 MG PO TABS
ORAL_TABLET | ORAL | Status: AC
  Administered 2021-07-23: 20 mg via ORAL
  Filled 2021-07-23: qty 1

## 2021-07-23 SURGICAL SUPPLY — 71 items
ADHESIVE MASTISOL STRL (MISCELLANEOUS) ×2 IMPLANT
BAG DECANTER FOR FLEXI CONT (MISCELLANEOUS) ×2 IMPLANT
BAG URINE DRAIN 2000ML AR STRL (UROLOGICAL SUPPLIES) ×2 IMPLANT
BAG URINE DRAIN UROCATCH STRL (MISCELLANEOUS) ×2 IMPLANT
BLADE SURG 15 STRL LF DISP TIS (BLADE) ×1 IMPLANT
BLADE SURG 15 STRL SS (BLADE) ×1
BLADE SURG 15 STRL SS SAFETY (BLADE) ×2 IMPLANT
BLADE SURG SZ10 CARB STEEL (BLADE) ×2 IMPLANT
BNDG GAUZE ELAST 4 BULKY (GAUZE/BANDAGES/DRESSINGS) ×2 IMPLANT
CANISTER SUCT 1200ML W/VALVE (MISCELLANEOUS) ×2 IMPLANT
CATH FOLEY 2WAY  5CC 16FR (CATHETERS) ×1
CATH FOLEY 2WAY SIL 16X30 (CATHETERS) ×2 IMPLANT
CATH URTH 16FR FL 2W BLN LF (CATHETERS) ×1 IMPLANT
CLEANER CAUTERY TIP 5X5 PAD (MISCELLANEOUS) ×1 IMPLANT
COVER MAYO STAND REUSABLE (DRAPES) ×2 IMPLANT
DERMABOND ADVANCED (GAUZE/BANDAGES/DRESSINGS) ×1
DERMABOND ADVANCED .7 DNX12 (GAUZE/BANDAGES/DRESSINGS) ×1 IMPLANT
DRAPE 3/4 80X56 (DRAPES) ×2 IMPLANT
DRAPE LAPAROTOMY 100X77 ABD (DRAPES) ×2 IMPLANT
DRAPE PERI LITHO V/GYN (MISCELLANEOUS) ×2 IMPLANT
DRAPE UNDER BUTTOCK W/FLU (DRAPES) ×2 IMPLANT
DRAPE UTILITY 15X26 TOWEL STRL (DRAPES) ×2 IMPLANT
DRAPE XRAY CASSETTE 23X24 (DRAPES) ×2 IMPLANT
ELECT CAUTERY BLADE 6.4 (BLADE) IMPLANT
ELECT REM PT RETURN 9FT ADLT (ELECTROSURGICAL) ×2
ELECTRODE REM PT RTRN 9FT ADLT (ELECTROSURGICAL) ×1 IMPLANT
GAUZE 4X4 16PLY ~~LOC~~+RFID DBL (SPONGE) ×14 IMPLANT
GAUZE PACK 2X3YD (PACKING) ×2 IMPLANT
GLOVE SURG ENC MOIS LTX SZ6.5 (GLOVE) ×10 IMPLANT
GLOVE SURG ENC MOIS LTX SZ8 (GLOVE) ×10 IMPLANT
GLOVE SURG POLY ORTHO LF SZ7.5 (GLOVE) ×4 IMPLANT
GLOVE SURG UNDER LTX SZ7 (GLOVE) ×2 IMPLANT
GOWN STRL REUS W/ TWL LRG LVL3 (GOWN DISPOSABLE) ×2 IMPLANT
GOWN STRL REUS W/ TWL XL LVL3 (GOWN DISPOSABLE) ×1 IMPLANT
GOWN STRL REUS W/TWL LRG LVL3 (GOWN DISPOSABLE) ×2
GOWN STRL REUS W/TWL XL LVL3 (GOWN DISPOSABLE) ×1
HANDLE YANKAUER SUCT BULB TIP (MISCELLANEOUS) ×2 IMPLANT
IV LACTATED RINGERS 1000ML (IV SOLUTION) ×2 IMPLANT
KIT TURNOVER CYSTO (KITS) ×2 IMPLANT
LABEL OR SOLS (LABEL) ×2 IMPLANT
MANIFOLD NEPTUNE II (INSTRUMENTS) ×2 IMPLANT
NDL SAFETY ECLIPSE 18X1.5 (NEEDLE) ×1 IMPLANT
NEEDLE HYPO 18GX1.5 SHARP (NEEDLE) ×1
NEEDLE HYPO 22GX1.5 SAFETY (NEEDLE) ×2 IMPLANT
NEEDLE SPNL 22GX3.5 QUINCKE BK (NEEDLE) ×2 IMPLANT
NS IRRIG 500ML POUR BTL (IV SOLUTION) ×2 IMPLANT
PACK BASIN MINOR ARMC (MISCELLANEOUS) ×2 IMPLANT
PAD CLEANER CAUTERY TIP 5X5 (MISCELLANEOUS) ×1
PAD OB MATERNITY 4.3X12.25 (PERSONAL CARE ITEMS) ×2 IMPLANT
PAD PREP 24X41 OB/GYN DISP (PERSONAL CARE ITEMS) ×2 IMPLANT
RETRACTOR PHONTONGUIDE ADAPT (ADAPTER) IMPLANT
SCRUB EXIDINE 4% CHG 4OZ (MISCELLANEOUS) ×2 IMPLANT
SET CYSTO W/LG BORE CLAMP LF (SET/KITS/TRAYS/PACK) ×2 IMPLANT
SLING TRANSOBTURATOR OBTRYX (Sling) ×2 IMPLANT
STRAP SAFETY 5IN WIDE (MISCELLANEOUS) ×2 IMPLANT
STRIP CLOSURE SKIN 1/2X4 (GAUZE/BANDAGES/DRESSINGS) ×2 IMPLANT
SURGILUBE 2OZ TUBE FLIPTOP (MISCELLANEOUS) ×2 IMPLANT
SUT CHROMIC 0 CT 1 (SUTURE) ×4 IMPLANT
SUT CHROMIC 1-0 (SUTURE) IMPLANT
SUT CHROMIC 2 0 CT 1 (SUTURE) IMPLANT
SUT VIC AB 0 CT1 27 (SUTURE) ×3
SUT VIC AB 0 CT1 27XCR 8 STRN (SUTURE) ×3 IMPLANT
SUT VIC AB 0 CT1 36 (SUTURE) ×10 IMPLANT
SUT VIC AB 2-0 CT1 (SUTURE) ×2 IMPLANT
SUT VICRYL 0 AB UR-6 (SUTURE) ×2 IMPLANT
SUT VICRYL+ 3-0 36IN CT-1 (SUTURE) IMPLANT
SYR 10ML LL (SYRINGE) ×4 IMPLANT
SYR CONTROL 10ML LL (SYRINGE) ×2 IMPLANT
SYR TOOMEY IRRIG 70ML (MISCELLANEOUS) ×2
SYRINGE TOOMEY IRRIG 70ML (MISCELLANEOUS) ×1 IMPLANT
TOWEL OR 17X26 4PK STRL BLUE (TOWEL DISPOSABLE) ×2 IMPLANT

## 2021-07-23 NOTE — Transfer of Care (Signed)
Immediate Anesthesia Transfer of Care Note  Patient: Cindy Stein  Procedure(s) Performed: HYSTERECTOMY VAGINAL ANTERIOR (CYSTOCELE) AND POSTERIOR REPAIR ([place]) PUBO-VAGINAL SLING  Patient Location: PACU  Anesthesia Type:General  Level of Consciousness: drowsy  Airway & Oxygen Therapy: Patient Spontanous Breathing and Patient connected to face mask oxygen  Post-op Assessment: Report given to RN and Post -op Vital signs reviewed and stable  Post vital signs: Reviewed and stable  Last Vitals:  Vitals Value Taken Time  BP 103/61 07/23/21 1336  Temp    Pulse 55 07/23/21 1339  Resp 19 07/23/21 1340  SpO2 91 % 07/23/21 1339  Vitals shown include unvalidated device data.  Last Pain:  Vitals:   07/23/21 0908  TempSrc: Oral  PainSc: 0-No pain         Complications: No notable events documented.

## 2021-07-23 NOTE — Anesthesia Postprocedure Evaluation (Signed)
Anesthesia Post Note  Patient: [name]  Procedure(s) Performed: HYSTERECTOMY VAGINAL ANTERIOR (CYSTOCELE) AND POSTERIOR REPAIR ([place]) [place] SLING  Patient location during evaluation: PACU Anesthesia Type: General Level of consciousness: awake and alert Pain management: pain level controlled Vital Signs Assessment: post-procedure vital signs reviewed and stable Respiratory status: spontaneous breathing, nonlabored ventilation, respiratory function stable and patient connected to nasal cannula oxygen Cardiovascular status: blood pressure returned to baseline and stable Postop Assessment: no apparent nausea or vomiting Anesthetic complications: no   No notable events documented.   Last Vitals:  Vitals:   07/23/21 1415 07/23/21 1419  BP: 103/64   Pulse: (!) 51 (!) 49  Resp: 18 16  Temp:    SpO2: 94% 99%    Last Pain:  Vitals:   07/23/21 1419  TempSrc:   PainSc: 6                  Precious Haws Shermaine Brigham

## 2021-07-23 NOTE — Progress Notes (Addendum)
Spoke with Dr. Amalia Hailey regarding patients home medications Carvedilol,  & olmesartan-hctz.  RN was given verbal to order home medications.  Orders placed  Patient also continues to c/o pain 8/10.  Dr. Amalia Hailey advised to administer toradol early.  Toradol given to patient.

## 2021-07-23 NOTE — Op Note (Signed)
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  POST-OPERATIVE DIAGNOSIS:  1)Same   OPERATION: Procedure(s) (LRB): HYSTERECTOMY VAGINAL (N/A) ANTERIOR (CYSTOCELE) AND POSTERIOR REPAIR (RECTOCELE) (N/A) PUBO-VAGINAL SLING (N/A)  PERINEOPLASTY  SURGEON(S): Surgeon(s) and Role:    Harlin Heys, MD - Primary   ANESTHESIA: General  ESTIMATED BLOOD LOSS: 175 mL  SPECIMEN:  ID Type Source Tests Collected by Time Destination  1 : uterus and cervix GYN Uterus and Cervix SURGICAL PATHOLOGY Harlin Heys, MD [id number]     COMPLICATIONS: None  DRAINS: Foley to gravity  DISPOSITION: Stable to recovery room  DESCRIPTION OF PROCEDURE:      The patient was prepped and draped in the dorsolithotomy position and placed under general anesthesia. The bladder was emptied.  The uterus was placed back within the vaginal canal.  A dilute Pitressin solution was checked to circumferentially mucosa surrounding the cervix.  Using the Bovie a circumferential incision was made around the cervix.  The vaginal mucosa was systematically dissected off of the cervix.  Posterior cul-de-sac was identified entered.  A weighted speculum was placed posteriorly.  The uterosacral ligaments were grasped divided suture-ligated.  The pedicles were held.  We carefully dissected anteriorly to the anterior peritoneum.  This was entered and the bladder was held.  Uterine arteries were clamped divided and suture-ligated.  We proceeded up the lateral aspects of the uterus clamping dividing and suture ligating all pedicles.  The uterus was delivered posteriorly and the remaining pedicles were clamped divided and suture ligated. Angle sutures were placed in the usual manner. A culdoplasty was performed. The peritoneum was identified anteriorly and then incorporating the left upper pedicle left lower pedicle right lower pedicle right  upper pedicle and anterior peritoneum a pursestring suture was placed exteriorizing all pedicles. Hemostasis of all pedicles was noted at this time.  The vaginal mucosa beginning at the vaginal cuff and overlying the bladder, was grasped with Allis clamps and injected with a dilute Pitressin solution in the midline. A midline incision was made to the level of the urethra. The vaginal mucosa was dissected laterally from the underlying attenuated fascia. A Foley catheter was placed within the bladder and the bladder was emptied. Clear urine was noted. The obturator foramina were identified in the usual manner bilaterally and marked with a marking pen the skin and subcutaneous tissues were injected with a dilute Pitressin solution. Stab incisions were made and the TOT trochars were placed through these incisions onto the operator's finger in the vagina which was retracted and bladder medially. The vaginal tape was then placed on the trochars and reversed through these incisions. A Kelly clamp was placed under the tape and the sleeves of the tape were removed. The tape was noted to be correctly positioned underneath the urethra without twists. The excess tape was removed at the level of the skin. Steri-Strips were applied over these small skin incisions. A typical Kelly plication was performed carefully covering the tension-free vaginal tape with thickened fascia. The bladder was plicated several sutures of 3-0 Vicryl.  A shelf of fascia was then approximated in the midline placing the bladder back in its more anatomic position. A large amount excess vaginal mucosa was trimmed. Vaginal mucosa was then closed in the midline with interrupted sutures to the level of the vaginal cuff. The vaginal cuff was closed with Vicryl suture. Hemostasis was noted. The posterior fourchette at approximately the  hymenal ring was grasped using Allis clamps. The posterior vaginal mucosa was injected in the midline with a dilute  Pitressin solution. A midline incision was made through the vaginal mucosa to the level of the vaginal cuff, and the vaginal mucosa was dissected laterally exposing the underlying attenuated fascia. Peritoneum over lying fatty tissue was identified and the enterocoele sac was tied of and approximated in a purse-string manner. Beginning at the vaginal cuff the attenuated fascia was grasped laterally and approximated in the midline thickening and tightening the fascia. These sutures were carried down to the level of the perineum. The excess vaginal mucosa was trimmed. The vagina was closed with interrupted sutures beginning at the vaginal cuff and carried down toward the perineal body. The vaginal diameter was mad as small as possible. The perineal body was reinforced with multiple sutures of Vicryl. The mucosa was then closed over the perineal body in a subcuticular manner. Hemostasis was noted.  Clear urine was noted in the Foley.   The patient went to recovery room in stable condition. Clear urine was noted in the Foley at the conclusion of the procedure.  Finis Bud, M.D. 07/23/2021 1:33 PM

## 2021-07-23 NOTE — Anesthesia Preprocedure Evaluation (Signed)
Anesthesia Evaluation  Patient identified by MRN, date of birth, ID band Patient awake    Reviewed: Allergy & Precautions, NPO status , Patient's Chart, lab work & pertinent test results  History of Anesthesia Complications Negative for: history of anesthetic complications  Airway Mallampati: III  TM Distance: <3 FB Neck ROM: full    Dental  (+) Chipped, Poor Dentition, Missing   Pulmonary neg shortness of breath,    Pulmonary exam normal        Cardiovascular hypertension, (-) anginaNormal cardiovascular exam     Neuro/Psych PSYCHIATRIC DISORDERS negative neurological ROS  negative psych ROS   GI/Hepatic negative GI ROS, Neg liver ROS, neg GERD  ,  Endo/Other  negative endocrine ROS  Renal/GU      Musculoskeletal  (+) Arthritis ,   Abdominal   Peds  Hematology negative hematology ROS (+)   Anesthesia Other Findings Past Medical History: No date: Anxiety No date: Atypical chest pain No date: DOE (dyspnea on exertion) 03/20/2020: DVT of lower extremity (deep venous thrombosis) (HCC)     Comment:  a.) LEFT femoral, popliteal, posterior tibial, and               paroneal veins; Tx'd with Rivaroxaban; b.) embolized on               03/21/2020 requiring PA thrombolysis and mechanical               thrombectomy on 03/22/2020 [id number]: Grade I diastolic dysfunction No date: History of immunosuppression     Comment:  long-term MTX for RA diagnosis 07/22/2019: History of PSVT (paroxysmal supraventricular tachycardia)     Comment:  a.) brief 5 beat run noted on [facility]io No date: HLD (hyperlipidemia) No date: Hypertension No date: Osteoarthritis 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 03/21/2020: Pulmonary embolus (HCC)     Comment:  a.) BILATERAL; required PA thrombolysis and mechanical               thrombectomy on 03/22/2020; b.) IVC filter placed prior               to knee surgery on 12/12/2020 and removed on  04/19/2021 No date: PVC (premature ventricular contraction) No date: Rheumatoid arthritis ([place]) No date: Valgus deformity of great toes, bilateral No date: Vitamin D deficiency  Past Surgical History: 2006: CARDIAC CATHETERIZATION No date: carpel tunnel; Bilateral No date: CATARACT EXTRACTION, BILATERAL 2006: CHOLECYSTECTOMY; N/A 12/11/2020: IVC FILTER INSERTION; N/A     Comment:  Procedure: IVC FILTER INSERTION;  Surgeon: Algernon Huxley,              MD;  Location: [facility];  Service:               Cardiovascular;  Laterality: N/A; 04/19/2021: IVC FILTER REMOVAL; N/A     Comment:  Procedure: IVC FILTER REMOVAL;  Surgeon: Algernon Huxley,               MD;  Location: [place] [facility];  Service:               Cardiovascular;  Laterality: N/A; 2021: JOINT REPLACEMENT; Right     Comment:  KNEE 06/14/2019: LEFT HEART CATH AND CORONARY ANGIOGRAPHY; Left     Comment:  Procedure: LEFT HEART CATH AND CORONARY ANGIOGRAPHY;                Surgeon: Wellington Hampshire, MD;  Location: [place]  [facility];  Service: Cardiovascular;  Laterality: Left; 03/22/2020: PULMONARY THROMBECTOMY; Bilateral     Comment:  Procedure: PULMONARY THROMBECTOMY / THROMBOLYSIS;                Surgeon: Algernon Huxley, MD;  Location: [place] [facility];  Service: Cardiovascular;  Laterality: Bilateral; 2022: REPLACEMENT TOTAL KNEE; Left  BMI    Body Mass Index: 36.58 kg/m      Reproductive/Obstetrics negative OB ROS                             Anesthesia Physical Anesthesia Plan  ASA: 3  Anesthesia Plan: General ETT   Post-op Pain Management:    Induction: Intravenous  PONV Risk Score and Plan: Ondansetron, Dexamethasone, Midazolam and Treatment may vary due to age or medical condition  Airway Management Planned: Oral ETT  Additional Equipment:   Intra-op Plan:   Post-operative Plan: Extubation in OR  Informed Consent: I have  reviewed the patients History and Physical, chart, labs and discussed the procedure including the risks, benefits and alternatives for the proposed anesthesia with the patient or authorized representative who has indicated his/her understanding and acceptance.     Dental Advisory Given  Plan Discussed with: Anesthesiologist, CRNA and Surgeon  Anesthesia Plan Comments: (Patient consented for risks of anesthesia including but not limited to:  - adverse reactions to medications - damage to eyes, teeth, lips or other oral mucosa - nerve damage due to positioning  - sore throat or hoarseness - Damage to heart, brain, nerves, lungs, other parts of body or loss of life  Patient voiced understanding.)        Anesthesia Quick Evaluation

## 2021-07-23 NOTE — Anesthesia Procedure Notes (Signed)
Procedure Name: Intubation Date/Time: 07/23/2021 10:08 AM Performed by: Tollie Eth, CRNA Pre-anesthesia Checklist: Patient identified, Patient being monitored, Timeout performed, Emergency Drugs available and Suction available Patient Re-evaluated:Patient Re-evaluated prior to induction Oxygen Delivery Method: Circle system utilized Preoxygenation: Pre-oxygenation with 100% oxygen Induction Type: IV induction Ventilation: Mask ventilation without difficulty Laryngoscope Size: McGraph and 4 Grade View: Grade I Tube type: Oral Tube size: 7.0 mm Number of attempts: 1 Airway Equipment and Method: Stylet Placement Confirmation: ETT inserted through vocal cords under direct vision, positive ETCO2 and breath sounds checked- equal and bilateral Secured at: 21 cm Tube secured with: Tape Dental Injury: Teeth and Oropharynx as per pre-operative assessment

## 2021-07-23 NOTE — Interval H&P Note (Signed)
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  The various methods of treatment have been discussed with the patient and family. After consideration of risks, benefits and other options for treatment, the patient has consented to  Procedure(s): HYSTERECTOMY VAGINAL (N/A) ANTERIOR (CYSTOCELE) AND POSTERIOR REPAIR (RECTOCELE) (N/A) PUBO-VAGINAL SLING (N/A) as a surgical intervention.  The patient's history has been reviewed, patient examined, no change in status, stable for surgery.  I have reviewed the patient's chart and labs.  Questions were answered to the patient's satisfaction.     Jeannie Fend

## 2021-07-24 ENCOUNTER — Ambulatory Visit: Payer: Medicare Other | Admitting: Dermatology

## 2021-07-24 ENCOUNTER — Encounter: Payer: Self-pay | Admitting: Obstetrics and Gynecology

## 2021-07-24 ENCOUNTER — Ambulatory Visit: Payer: Medicare Other | Admitting: Family Medicine

## 2021-07-24 DIAGNOSIS — Z79899 Other long term (current) drug therapy: Secondary | ICD-10-CM | POA: Diagnosis not present

## 2021-07-24 DIAGNOSIS — N813 Complete uterovaginal prolapse: Secondary | ICD-10-CM | POA: Diagnosis not present

## 2021-07-24 DIAGNOSIS — Z7982 Long term (current) use of aspirin: Secondary | ICD-10-CM | POA: Diagnosis not present

## 2021-07-24 DIAGNOSIS — I10 Essential (primary) hypertension: Secondary | ICD-10-CM | POA: Diagnosis not present

## 2021-07-24 MED ORDER — IBUPROFEN 600 MG PO TABS
600.0000 mg | ORAL_TABLET | Freq: Four times a day (QID) | ORAL | Status: DC
  Administered 2021-07-24 – 2021-07-25 (×5): 600 mg via ORAL
  Filled 2021-07-24 (×5): qty 1

## 2021-07-25 DIAGNOSIS — I10 Essential (primary) hypertension: Secondary | ICD-10-CM | POA: Diagnosis not present

## 2021-07-25 DIAGNOSIS — N813 Complete uterovaginal prolapse: Secondary | ICD-10-CM | POA: Diagnosis not present

## 2021-07-25 DIAGNOSIS — Z79899 Other long term (current) drug therapy: Secondary | ICD-10-CM | POA: Diagnosis not present

## 2021-07-25 DIAGNOSIS — Z7982 Long term (current) use of aspirin: Secondary | ICD-10-CM | POA: Diagnosis not present

## 2021-07-25 LAB — SURGICAL PATHOLOGY

## 2021-07-25 MED ORDER — IBUPROFEN 600 MG PO TABS: 600.0000 mg | ORAL_TABLET | Freq: Four times a day (QID) | ORAL | 0 refills | Status: DC

## 2021-07-25 NOTE — Progress Notes (Signed)
Patient discharged home with daughter. Discharge instructions, when to follow up, and prescriptions reviewed with patient.  Patient verbalized understanding. Patient will be escorted out by auxiliary.

## 2021-07-25 NOTE — Progress Notes (Signed)
   07/25/21 0947  Clinical Encounter Type  Visited With Patient  Visit Type Follow-up  Referral From Nurse  Consult/Referral To Chaplain  Spiritual Encounters  Spiritual Needs Emotional  Stress Factors  Patient Stress Factors Health changes  Chaplain Laurabelle Gorczyca visited room 351A Pt, Mrs. Cindy Stein. I visited Mrs. Cindy Stein on yesterday and I promised I would return on today to follow up. Mrs. Cindy Stein was sitting up in the chair next to her bed waiting for her breakfast. She stated, 'it is so nice to see you again and we both started singing good morning to you, good morning to you. I asked how she was doing and was she being discharged today?  She stated, everything is going well and I'm feeling ok, I should be going home to day." We began to engage in conversation and I used reflective listening as she picked up from where we left off on yesterday telling me more about her life and family. I provided spiritual and emotional support and a ministry of presence

## 2021-07-25 NOTE — Discharge Summary (Signed)
Discharge Summary  Admit date: 07/23/2021  Discharge Date and Time:07/25/2021  10:53 AM  Discharge to:  Home  Admission Diagnosis:  Pelvic prolapse -fourth degree Stress urinary incontinence Pelvic pain                    Discharge  Diagnoses: Active Problems:   Post-operative state   OR Procedures:   Procedure(s): HYSTERECTOMY VAGINAL ANTERIOR (CYSTOCELE) AND POSTERIOR REPAIR (RECTOCELE) PUBO-VAGINAL SLING Date -------------------                              Discharge Day Progress Note:   Subjective:   The patient does not have complaints.  She is ambulating well. She is taking PO well. Her pain is well controlled with her current medications. She is urinating without difficulty and is passing flatus.   Objective:  BP 122/68 (BP Location: Left Arm)   Pulse 61   Temp 97.6 F (36.4 C) (Oral)   Resp 18   Ht '5\' 2"'$  (1.575 m)   Wt 90.7 kg   SpO2 99%   BMI 36.58 kg/m     Abdomen:                         Patient underwent voiding trial today.  Voided over 300 with residual less than 60.    Assessment:   Doing well.  Normal progress as expected.   Voiding without difficulty   No pain  Plan:        Discharge home.                       Medications as directed.  Hospital Course:  No notes on file   Condition at Discharge:  good Discharge Medications:  Allergies as of 07/25/2021       Reactions   Acetaminophen Other (See Comments)   Tylenol is ineffective   Tramadol Other (See Comments)   Heavily sedated "feels drunk"        Medication List     STOP taking these medications    estradiol 0.1 MG/GM vaginal cream Commonly known as: ESTRACE   metroNIDAZOLE 500 MG tablet Commonly known as: FLAGYL       TAKE these medications    albuterol 108 (90 Base) MCG/ACT inhaler Commonly known as: VENTOLIN HFA Inhale 2 puffs into the lungs every 6 (six) hours as needed for wheezing or shortness of breath.   aspirin EC 81 MG tablet Take 81 mg by mouth  in the morning. Swallow whole.   carvedilol 3.125 MG tablet Commonly known as: COREG Take 1 tablet (3.125 mg total) by mouth 2 (two) times daily.   folic acid 1 MG tablet Commonly known as: FOLVITE Take 1 mg by mouth in the morning.   ibuprofen 600 MG tablet Commonly known as: ADVIL Take 1 tablet (600 mg total) by mouth every 6 (six) hours.   methotrexate 2.5 MG tablet Commonly known as: RHEUMATREX Take 15 mg by mouth every Sunday.   naproxen sodium 220 MG tablet Commonly known as: ALEVE Take 220 mg by mouth daily as needed (pain.).   olmesartan-hydrochlorothiazide 20-12.5 MG tablet Commonly known as: BENICAR HCT TAKE 1 TABLET BY MOUTH ONCE A DAY               Discharge Care Instructions  (From admission, onward)  Start     Ordered   07/25/21 0000  No dressing needed       Comments: Keep wound area clean and dry as directed   07/25/21 1052             Follow Up:    Follow-up Information     Harlin Heys, MD Follow up.   Specialties: Obstetrics and Gynecology, Radiology Contact information: [address] [place] [address] [phone]                 Finis Bud, M.D. 07/25/2021 10:53 AM

## 2021-07-27 ENCOUNTER — Ambulatory Visit: Payer: Medicare Other | Admitting: Family Medicine

## 2021-07-30 ENCOUNTER — Telehealth: Payer: Self-pay

## 2021-07-30 ENCOUNTER — Telehealth: Payer: Self-pay | Admitting: Cardiovascular Disease

## 2021-07-30 NOTE — Telephone Encounter (Signed)
Pt's daughter Sunday Spillers called and states that pt's blood pressure is currently 81/61 and pulse is 92. She is with the patient so I transferred to [place] at [facility] to triage. She states that her heart is fluttering and beating out of her chest. I let the pt coordinator with Access nurse know that we do not have any appts avail virtually or in office.

## 2021-07-30 NOTE — Telephone Encounter (Signed)
Spoke with Dr. [name] and he recommended patient be evaluated at [facility] for their ongoing symptoms. Called and informed the patient and sylvia and let them know they could go to [facility] or [facility] [place] [facility] if they do not want to go to [facility]. Sunday Spillers states that [place] is resting at home and is not having any more chest or throat tightness. Asher declined going to the ED and states that she will rest at home. She states that the EMS driver told her that she is stable and that if she goes to the hospital she will most likely wait 8 hours to be evaluated. Sunday Spillers states that they were told to check Gyanna's blood pressure early in the morning and asks if she should still take her blood pressure medication when it reads 80/60. Sunday Spillers states that She was knew Dr. [name] would recommend another hospital as "telling patients to go to [facility] is just sending them to wait on their death." Sunday Spillers thanked me for my time and asked [place] to get back to her concerning the medication before disconnecting the call.

## 2021-07-30 NOTE — Telephone Encounter (Signed)
Pt c/o BP issue: STAT if pt c/o blurred vision, one-sided weakness or slurred speech  1. What are your last 5 BP readings?  Today 90/69 HR 96, 107/68, 89/61  2. Are you having any other symptoms (ex. Dizziness, headache, blurred vision, passed out)? States her heart feels like it is "wobbling", weakness. Denies any pain and dizziness  3. What is your BP issue? low  Daughter states pt has hysterectomy last week and BP was 100/56 while she was in the hospital.   Patient's daughter thinks symptoms may be coming from medication.

## 2021-07-30 NOTE — Telephone Encounter (Signed)
Received a call from Pt daughter Cindy Stein stating that Cindy Stein is currently being evaluated by EMS but is refusing Transport. Pt stated that she would rather be at home for hours in pain than in the hospital for hours untreated. Pt BP was reading 81/61 when  they called into Access Nurse. Cindy Stein had stated that her chest was tight and "felt like it would explode" and that her chest was "wobbling". Cindy Stein stated that once EMS arrived, BP was 120/80 and that she was fine with her blood pressure. Pt is still having chest tightness but the "wobbling" sensation has gone away. EMS tried to convince Cindy Stein to go to the hospital due to onging chest tightness, Cindy Stein refused. Cindy Stein states that Cindy Stein went and had surgery done last Monday, 07/23/21, and is still bleeding. Cindy Stein asked if she should stop her blood pressure medication and asks for more advice as Cindy Stein refuses to go to the hospital. At 3:48pm Cindy Stein BP was 112/70 with a pulse of 77

## 2021-07-30 NOTE — Telephone Encounter (Signed)
She should not take any BP medication if her BP is <100/60.

## 2021-07-30 NOTE — Telephone Encounter (Signed)
This type of situation cannot be evaluated over the phone.  She is postsurgery and there could be some complications.  Her primary care physician has advised her to seek evaluation in the ED which I agree with.

## 2021-07-30 NOTE — Telephone Encounter (Signed)
Spoke with pt.  C/o low BP since hysterectomy surgery last week.  Pt confirms that she is not taking any narcotic pain meds that could cause lower BP, only taking Rx Ibuprofen.  Right now BP 89/61 HR 100.  Pt takes Benicar 20-12.5 mg daily and Coreg 3.125 mg twice daily.  Pt feels stable at this time.  Advised to stay well hydrated, change positions slowly, hold Benicar dose tomorrow morning.  Notified pt I will send to provider to make aware for further recc.  Pt has appt with Dr. Fletcher Anon 09/11/21.  Otherwise, pt understands to call 911 or go to ER if s/s worsen overnight or becomes unstable.

## 2021-07-31 NOTE — Telephone Encounter (Signed)
Called to speak with Dariana's daughter Sunday Spillers. Left a message to call back.

## 2021-07-31 NOTE — Telephone Encounter (Signed)
DPR on file. Patient daughter Sunday Spillers made aware of Dr. Tyrell Antonio response and recommendation. Sunday Spillers verbalized understanding and will call the patient to make her aware.

## 2021-07-31 NOTE — Telephone Encounter (Signed)
Cindy Stein returned the call. She states that she was contacted by [facility] cardiologist and they were instructed to go to another hospital that is not [facility]. Pt verbalized understanding and Cindy Stein states that they are headed to a different hospital, either in [place] or [place]. Cindy Stein states that they have not taken any BP medication and that [name]'s blood pressure has stayed at 81/60. Cindy Stein verbalized understanding and asked if Dr. [name] is willing to work in [place] into his schedule for an appointment. Pt is scheduled with her Cardiologist but will not be able to see them until the end of October.

## 2021-07-31 NOTE — Telephone Encounter (Signed)
I can find a slot for her after she is evaluated at the hospital for this.

## 2021-08-01 ENCOUNTER — Telehealth: Payer: Self-pay | Admitting: Obstetrics and Gynecology

## 2021-08-01 ENCOUNTER — Observation Stay
Admission: EM | Admit: 2021-08-01 | Discharge: 2021-08-02 | Disposition: A | Discharge status: Home / Self Care | Payer: Medicare Other | Attending: Student | Admitting: Student

## 2021-08-01 ENCOUNTER — Emergency Department: Payer: Medicare Other

## 2021-08-01 ENCOUNTER — Other Ambulatory Visit: Payer: Self-pay

## 2021-08-01 ENCOUNTER — Encounter: Payer: Medicare Other | Admitting: Obstetrics and Gynecology

## 2021-08-01 DIAGNOSIS — R7303 Prediabetes: Secondary | ICD-10-CM | POA: Diagnosis not present

## 2021-08-01 DIAGNOSIS — Z7982 Long term (current) use of aspirin: Secondary | ICD-10-CM | POA: Insufficient documentation

## 2021-08-01 DIAGNOSIS — R0789 Other chest pain: Secondary | ICD-10-CM | POA: Diagnosis not present

## 2021-08-01 DIAGNOSIS — I2699 Other pulmonary embolism without acute cor pulmonale: Secondary | ICD-10-CM | POA: Insufficient documentation

## 2021-08-01 DIAGNOSIS — I493 Ventricular premature depolarization: Secondary | ICD-10-CM | POA: Diagnosis present

## 2021-08-01 DIAGNOSIS — Z96653 Presence of artificial knee joint, bilateral: Secondary | ICD-10-CM | POA: Insufficient documentation

## 2021-08-01 DIAGNOSIS — I10 Essential (primary) hypertension: Secondary | ICD-10-CM | POA: Insufficient documentation

## 2021-08-01 DIAGNOSIS — Z79899 Other long term (current) drug therapy: Secondary | ICD-10-CM | POA: Insufficient documentation

## 2021-08-01 DIAGNOSIS — R002 Palpitations: Secondary | ICD-10-CM | POA: Diagnosis present

## 2021-08-01 DIAGNOSIS — I200 Unstable angina: Secondary | ICD-10-CM

## 2021-08-01 DIAGNOSIS — K760 Fatty (change of) liver, not elsewhere classified: Secondary | ICD-10-CM | POA: Diagnosis not present

## 2021-08-01 DIAGNOSIS — R079 Chest pain, unspecified: Principal | ICD-10-CM | POA: Diagnosis present

## 2021-08-01 DIAGNOSIS — Z20822 Contact with and (suspected) exposure to covid-19: Secondary | ICD-10-CM | POA: Diagnosis not present

## 2021-08-01 LAB — RESP PANEL BY RT-PCR (FLU A&B, COVID) ARPGX2
Influenza A by PCR: NEGATIVE
Influenza B by PCR: NEGATIVE
SARS Coronavirus 2 by RT PCR: NEGATIVE

## 2021-08-01 LAB — CBC
HCT: 39.2 % (ref 36.0–46.0)
Hemoglobin: 13.4 g/dL (ref 12.0–15.0)
MCH: 30.4 pg (ref 26.0–34.0)
MCHC: 34.2 g/dL (ref 30.0–36.0)
MCV: 88.9 fL (ref 80.0–100.0)
Platelets: 235 10*3/uL (ref 150–400)
RBC: 4.41 MIL/uL (ref 3.87–5.11)
RDW: 13.7 % (ref 11.5–15.5)
WBC: 5.9 10*3/uL (ref 4.0–10.5)
nRBC: 0 % (ref 0.0–0.2)

## 2021-08-01 LAB — HEPATIC FUNCTION PANEL
ALT: 22 U/L (ref 0–44)
AST: 21 U/L (ref 15–41)
Albumin: 3.7 g/dL (ref 3.5–5.0)
Alkaline Phosphatase: 72 U/L (ref 38–126)
Bilirubin, Direct: 0.1 mg/dL (ref 0.0–0.2)
Indirect Bilirubin: 0.7 mg/dL (ref 0.3–0.9)
Total Bilirubin: 0.8 mg/dL (ref 0.3–1.2)
Total Protein: 7 g/dL (ref 6.5–8.1)

## 2021-08-01 LAB — BASIC METABOLIC PANEL
Anion gap: 7 (ref 5–15)
BUN: 22 mg/dL (ref 8–23)
CO2: 24 mmol/L (ref 22–32)
Calcium: 9.1 mg/dL (ref 8.9–10.3)
Chloride: 106 mmol/L (ref 98–111)
Creatinine, Ser: 0.88 mg/dL (ref 0.44–1.00)
GFR, Estimated: >60 mL/min (ref >60)
Glucose, Bld: 196 mg/dL — ABNORMAL HIGH (ref 70–99)
Potassium: 3.4 mmol/L — ABNORMAL LOW (ref 3.5–5.1)
Sodium: 137 mmol/L (ref 135–145)

## 2021-08-01 LAB — TROPONIN I (HIGH SENSITIVITY)
Troponin I (High Sensitivity): 5 ng/L (ref <18)
Troponin I (High Sensitivity): 5 ng/L (ref <18)

## 2021-08-01 LAB — TSH: TSH: 3 u[IU]/mL (ref 0.350–4.500)

## 2021-08-01 LAB — D-DIMER, QUANTITATIVE: D-Dimer, Quant: 1.91 ug/mL-FEU — ABNORMAL HIGH (ref 0.00–0.50)

## 2021-08-01 LAB — LACTIC ACID, PLASMA: Lactic Acid, Venous: 1.4 mmol/L (ref 0.5–1.9)

## 2021-08-01 MED ORDER — SODIUM CHLORIDE 0.9 % IV SOLN: INTRAVENOUS | Status: DC

## 2021-08-01 MED ORDER — ONDANSETRON HCL 4 MG/2ML IJ SOLN: 4.0000 mg | Freq: Four times a day (QID) | INTRAMUSCULAR | Status: DC | PRN

## 2021-08-01 MED ORDER — ENOXAPARIN SODIUM 40 MG/0.4ML IJ SOSY: 40.0000 mg | PREFILLED_SYRINGE | INTRAMUSCULAR | Status: DC

## 2021-08-01 MED ORDER — ASPIRIN EC 81 MG PO TBEC: 81.0000 mg | DELAYED_RELEASE_TABLET | Freq: Every morning | ORAL | Status: DC

## 2021-08-01 MED ORDER — PANTOPRAZOLE SODIUM 40 MG PO TBEC
40.0000 mg | DELAYED_RELEASE_TABLET | Freq: Two times a day (BID) | ORAL | Status: DC
  Administered 2021-08-01: 40 mg via ORAL
  Filled 2021-08-01: qty 1

## 2021-08-01 MED ORDER — ACETAMINOPHEN 325 MG PO TABS
650.0000 mg | ORAL_TABLET | Freq: Four times a day (QID) | ORAL | Status: DC | PRN
Start: 2021-08-01 — End: 2021-08-02

## 2021-08-01 MED ORDER — ENOXAPARIN SODIUM 60 MG/0.6ML IJ SOSY
0.5000 mg/kg | PREFILLED_SYRINGE | INTRAMUSCULAR | Status: DC
  Administered 2021-08-01: 45 mg via SUBCUTANEOUS
  Filled 2021-08-01: qty 0.6

## 2021-08-01 MED ORDER — FOLIC ACID 1 MG PO TABS: 1.0000 mg | ORAL_TABLET | Freq: Every day | ORAL | Status: DC

## 2021-08-01 MED ORDER — ALBUTEROL SULFATE (2.5 MG/3ML) 0.083% IN NEBU: 2.5000 mg | INHALATION_SOLUTION | Freq: Four times a day (QID) | RESPIRATORY_TRACT | Status: DC | PRN

## 2021-08-01 MED ORDER — NITROGLYCERIN 0.4 MG SL SUBL: 0.4000 mg | SUBLINGUAL_TABLET | SUBLINGUAL | Status: DC | PRN

## 2021-08-01 MED ORDER — IOHEXOL 350 MG/ML SOLN
75.0000 mL | Freq: Once | INTRAVENOUS | Status: AC | PRN
  Administered 2021-08-01: 75 mL via INTRAVENOUS

## 2021-08-01 NOTE — ED Notes (Signed)
Dr Cinda Quest at bedside.

## 2021-08-01 NOTE — ED Provider Notes (Signed)
[facility] Emergency Department Provider Note  ____________________________________________   None    (approximate)  I have reviewed the triage vital signs and the nursing notes.   HISTORY  Chief Complaint Numbness and Chest Pain    HPI Janaki Sisler is a 65 y.o. female who reports chest heaviness and chest pressure getting worse over the last week with exertion.  Chest pressure gets better with rest.  She has a past history of heart disease and also of DVT with PE and filter placement.  Patient has no fever.  Patient is not nauseated.  Patient's not sweaty.        Past Medical History:  Diagnosis Date   Anxiety    Atypical chest pain    DOE (dyspnea on exertion)    DVT of lower extremity (deep venous thrombosis) ([place]) 03/20/2020   a.) LEFT femoral, popliteal, posterior tibial, and paroneal veins; Tx'd with Rivaroxaban; b.) embolized on 03/21/2020 requiring PA thrombolysis and mechanical thrombectomy on 03/22/2020   Grade I diastolic dysfunction [id number]   History of immunosuppression    long-term MTX for RA diagnosis   History of PSVT (paroxysmal supraventricular tachycardia) 07/22/2019   a.) brief 5 beat run noted on Zio   HLD (hyperlipidemia)    Hypertension    Osteoarthritis    Plantar fascial fibromatosis    Pulmonary embolus ([place]) 03/21/2020   a.) BILATERAL; required PA thrombolysis and mechanical thrombectomy on 03/22/2020; b.) IVC filter placed prior to knee surgery on 12/12/2020 and removed on 04/19/2021   PVC (premature ventricular contraction)    Rheumatoid arthritis (HCC)    Valgus deformity of great toes, bilateral    Vitamin D deficiency     Patient Active Problem List   Diagnosis Date Noted   Post-operative state 07/23/2021   Preoperative evaluation to rule out surgical contraindication 07/09/2021   Prediabetes 07/09/2021   S/P IVC filter 04/03/2021   Proteinuria 01/26/2021   Microscopic hematuria 01/26/2021    Pre-bariatric surgery nutrition evaluation 12/05/2020   History of cholecystectomy 10/24/2020   History of decompression of median nerve 10/24/2020   History of total knee arthroplasty 10/24/2020   Hypertensive disorder 10/24/2020   Osteoarthritis of left knee 10/24/2020   Obesity 08/29/2020   Palpitations 06/27/2020   Pulmonary nodules 03/28/2020   History of pulmonary embolus (PE) 03/21/2020   History of DVT (deep vein thrombosis) 03/21/2020   Hypertension    PVC (premature ventricular contraction)     Past Surgical History:  Procedure Laterality Date   ANTERIOR AND POSTERIOR REPAIR N/A 07/23/2021   Procedure: ANTERIOR (CYSTOCELE) AND POSTERIOR REPAIR (RECTOCELE);  Surgeon: Harlin Heys, MD;  Location: [facility];  Service: Gynecology;  Laterality: N/A;   CARDIAC CATHETERIZATION  2006   carpel tunnel Bilateral    CATARACT EXTRACTION, BILATERAL     CHOLECYSTECTOMY N/A 2006   IVC FILTER INSERTION N/A 12/11/2020   Procedure: IVC FILTER INSERTION;  Surgeon: Algernon Huxley, MD;  Location: [place] [facility];  Service: Cardiovascular;  Laterality: N/A;   IVC FILTER REMOVAL N/A 04/19/2021   Procedure: IVC FILTER REMOVAL;  Surgeon: Algernon Huxley, MD;  Location: [place] [facility];  Service: Cardiovascular;  Laterality: N/A;   JOINT REPLACEMENT Right 2021   KNEE   LEFT HEART CATH AND CORONARY ANGIOGRAPHY Left 06/14/2019   Procedure: LEFT HEART CATH AND CORONARY ANGIOGRAPHY;  Surgeon: Wellington Hampshire, MD;  Location: [place] [facility];  Service: Cardiovascular;  Laterality: Left;   PUBOVAGINAL SLING N/A  07/23/2021   Procedure: Gaynelle Arabian;  Surgeon: Harlin Heys, MD;  Location: [facility];  Service: Gynecology;  Laterality: N/A;   PULMONARY THROMBECTOMY Bilateral 03/22/2020   Procedure: PULMONARY THROMBECTOMY / THROMBOLYSIS;  Surgeon: Algernon Huxley, MD;  Location: [place] [facility];  Service: Cardiovascular;  Laterality: Bilateral;   REPLACEMENT TOTAL KNEE Left 2022    VAGINAL HYSTERECTOMY N/A 07/23/2021   Procedure: HYSTERECTOMY VAGINAL;  Surgeon: Harlin Heys, MD;  Location: [facility];  Service: Gynecology;  Laterality: N/A;    Prior to Admission medications   Medication Sig Start Date End Date Taking? Authorizing Provider  albuterol (VENTOLIN HFA) 108 (90 Base) MCG/ACT inhaler Inhale 2 puffs into the lungs every 6 (six) hours as needed for wheezing or shortness of breath. 09/01/20   Parrett, Fonnie Mu, NP  aspirin EC 81 MG tablet Take 81 mg by mouth in the morning. Swallow whole.    [provider]  carvedilol (COREG) 3.125 MG tablet Take 1 tablet (3.125 mg total) by mouth 2 (two) times daily. 06/11/21   Leone Haven, MD  folic acid (FOLVITE) 1 MG tablet Take 1 mg by mouth in the morning. 05/10/21   [provider]  ibuprofen (ADVIL) 600 MG tablet Take 1 tablet (600 mg total) by mouth every 6 (six) hours. 07/25/21   Harlin Heys, MD  methotrexate (RHEUMATREX) 2.5 MG tablet Take 15 mg by mouth every Sunday. 05/10/21   [provider]  naproxen sodium (ALEVE) 220 MG tablet Take 220 mg by mouth daily as needed (pain.).    [provider]  olmesartan-hydrochlorothiazide (BENICAR HCT) 20-12.5 MG tablet TAKE 1 TABLET BY MOUTH ONCE A DAY 04/18/21   Leone Haven, MD    Allergies Acetaminophen and Tramadol  Family History  Problem Relation Age of Onset   Asthma Mother    Asthma Father    Heart failure Father    Leukemia Sister    Breast cancer Neg Hx     Social History Social History   Tobacco Use   Smoking status: Never   Smokeless tobacco: Never  Vaping Use   Vaping Use: Never used  Substance Use Topics   Alcohol use: Yes    Comment: OCCAS   Drug use: Never    Review of Systems  Constitutional: No fever/chills Eyes: No visual changes. ENT: No sore throat. Cardiovascular: Denies chest pain. Respiratory: Denies shortness of breath. Gastrointestinal: No abdominal pain.  No nausea, no  vomiting.  No diarrhea.  No constipation. Genitourinary: Negative for dysuria. Musculoskeletal: Negative for back pain. Skin: Negative for rash. Neurological: Negative for headaches, focal weakness   ____________________________________________   PHYSICAL EXAM:  VITAL SIGNS: ED Triage Vitals  Enc Vitals Group     BP 08/01/21 1028 125/69     Pulse Rate 08/01/21 1028 80     Resp 08/01/21 1028 18     Temp 08/01/21 1028 98.6 F (37 C)     Temp Source 08/01/21 1028 Oral     SpO2 08/01/21 1028 98 %     Weight 08/01/21 1030 200 lb (90.7 kg)     Height 08/01/21 1030 '5\' 2"'$  (1.575 m)     Head Circumference --      Peak Flow --      Pain Score 08/01/21 1029 6     Pain Loc --      Pain Edu? --      Excl. in [place]? --     Constitutional: Alert and  oriented. Well appearing and in no acute distress. Eyes: Conjunctivae are normal.  Head: Atraumatic. Nose: No congestion/rhinnorhea. Mouth/Throat: Mucous membranes are moist.  Oropharynx non-erythematous. Neck: No stridor. Cardiovascular: Normal rate, regular rhythm. Grossly normal heart sounds.  Good peripheral circulation. Respiratory: Normal respiratory effort.  No retractions. Lungs CTAB. Gastrointestinal: Soft and nontender. No distention. No abdominal bruits.  Musculoskeletal: No lower extremity tenderness nor edema.   Neurologic:  Normal speech and language. No gross focal neurologic deficits are appreciated.  Skin:  Skin is warm, dry and intact. No rash noted.   ____________________________________________   LABS (all labs ordered are listed, but only abnormal results are displayed)  Labs Reviewed  BASIC METABOLIC PANEL - Abnormal; Notable for the following components:      Result Value   Potassium 3.4 (*)    Glucose, Bld 196 (*)    All other components within normal limits  D-DIMER, QUANTITATIVE - Abnormal; Notable for the following components:   D-Dimer, Quant 1.91 (*)    All other components within normal limits  RESP  PANEL BY RT-PCR (FLU A&B, COVID) [id number]  CBC  HEPATIC FUNCTION PANEL  LACTIC ACID, PLASMA  TROPONIN I (HIGH SENSITIVITY)  TROPONIN I (HIGH SENSITIVITY)   ____________________________________________  EKG  EKG read interpreted by me shows normal sinus rhythm at 80 normal axis nonspecific ST-T wave changes ____________________________________________  RADIOLOGY Gertha Calkin, personally viewed and evaluated these images (plain radiographs) as part of my medical decision making, as well as reviewing the written report by the radiologist.  ED MD interpretation: Chest x-ray read by radiology reviewed by me does not show any acute problems  Official radiology report(s): DG Chest 2 View  Result Date: 08/01/2021 CLINICAL DATA:  Chest discomfort and heaviness, atypical chest pain, history of pulmonary embolism in 2021 EXAM: CHEST - 2 VIEW COMPARISON:  08/30/2020 FINDINGS: Normal heart size, mediastinal contours, and pulmonary vascularity. Lungs clear. No pulmonary infiltrate, pleural effusion, or pneumothorax. Osseous structures unremarkable. IMPRESSION: No acute abnormalities. Electronically Signed   By: Lavonia Dana M.D.   On: 08/01/2021 11:38   CT Angio Chest PE W and/or Wo Contrast  Result Date: 08/01/2021 CLINICAL DATA:  Chest heaviness for 3 days EXAM: CT ANGIOGRAPHY CHEST WITH CONTRAST TECHNIQUE: Multidetector CT imaging of the chest was performed using the standard protocol during bolus administration of intravenous contrast. Multiplanar CT image reconstructions and MIPs were obtained to evaluate the vascular anatomy. CONTRAST:  49m OMNIPAQUE IOHEXOL 350 MG/ML SOLN COMPARISON:  Chest x-ray from earlier in the same day, 06/22/2020. FINDINGS: Cardiovascular: Thoracic aorta shows no aneurysmal dilatation. Mild atherosclerotic calcifications are noted. No cardiac enlargement is seen. The pulmonary artery shows a normal branching pattern bilaterally. No focal filling defect to suggest  pulmonary embolism is noted. Mediastinum/Nodes: Thoracic inlet is within normal limits. No sizable hilar or mediastinal adenopathy is noted. The esophagus as visualized is within normal limits. Lungs/Pleura: Lungs are well aerated bilaterally. Scattered small subcentimeter nodules are noted throughout both lungs stable in appearance from the prior exam. No focal infiltrate or sizable effusion is noted. Upper Abdomen: Fatty infiltration of the liver is noted. Gallbladder has been surgically removed. Remainder of the upper abdomen appears within normal limits. Musculoskeletal: Degenerative changes of the thoracic spine are noted. No acute rib abnormality is noted. T11 hemangioma is noted and stable. Review of the MIP images confirms the above findings. IMPRESSION: No evidence of pulmonary emboli. Stable subcentimeter nodules. Given their stability over the past 16 months there felt  to be benign in etiology. No further follow-up is recommended. Fatty liver. Aortic Atherosclerosis (ICD10-I70.0). Electronically Signed   By: Inez Catalina M.D.   On: 08/01/2021 15:05    ____________________________________________   PROCEDURES  Procedure(s) performed (including Critical Care):  Procedures   ____________________________________________   INITIAL IMPRESSION / ASSESSMENT AND PLAN / ED COURSE  Patient with negative CT scan for PE however her history is very suggestive of crescendo angina.  We will get her in the hospital to evaluate her further for this.  There is no sign of any infiltrate or pneumonia no history suggestive of COVID no pain elsewhere with exertion.  It sounds most like crescendo angina as I mention.             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    Nena Polio, MD 08/01/21 1524

## 2021-08-01 NOTE — Telephone Encounter (Signed)
Noted.  The patient is currently in the ED.

## 2021-08-01 NOTE — Progress Notes (Signed)
PHARMACIST - PHYSICIAN COMMUNICATION  CONCERNING:  Enoxaparin (Lovenox) for DVT Prophylaxis    RECOMMENDATION: Patient was prescribed enoxaprin '40mg'$  q24 hours for VTE prophylaxis.   Filed Weights   08/01/21 1030  Weight: 90.7 kg (200 lb)    Body mass index is 36.58 kg/m.  Estimated Creatinine Clearance: 64.9 mL/min (by C-G formula based on SCr of 0.88 mg/dL).   Based on [place] patient is candidate for enoxaparin 0.'5mg'$ /kg TBW SQ every 24 hours based on BMI being >30.  DESCRIPTION: Pharmacy has adjusted enoxaparin dose per [facility] policy.  Patient is now receiving enoxaparin 45 mg every 24 hours    Berta Minor, PharmD Clinical Pharmacist  08/01/2021 4:22 PM

## 2021-08-01 NOTE — H&P (Signed)
[facility] History and Physical   Patient: Cindy Stein [id number]   PCP: Leone Haven, MD DOB: [birth year]   DOA: 08/01/2021   DOS: 08/01/2021   DOS: the patient was seen and examined on 08/01/2021  Patient coming from: The patient is coming from Home  Chief Complaint: Pressure and palpitations  HPI: Cindy Stein is a 65 y.o. female with Past medical history of HTN, HLD, left lower extremity DVT, pulmonary embolism, status post IVC filter which was removed, rheumatoid arthritis on methotrexate, hysterectomy on 07/23/2021, presented with chest pain off and on, more on exertion, feels chest pressure and throat tightness which resolves with rest.  Patient also noticed palpitations on Monday, EMS was called, patient does not wanted to come to the hospital.  Patient took recommended morning and then stopped blood pressure medications due to soft blood pressure.  Currently patient is chest pain-free but she had chest pressure yesterday morning.  Patient was concerned about heart rate take, she had DVT and PE so she came in for further evaluation.  ED Course: EKG no significant changes, D-dimer 1.9 elevated, CTA chest ruled out PE   Review of Systems: as mentioned in the history of present illness.  All other systems reviewed and are negative.  Past Medical History:  Diagnosis Date   Anxiety    Atypical chest pain    DOE (dyspnea on exertion)    DVT of lower extremity (deep venous thrombosis) ([place]) 03/20/2020   a.) LEFT femoral, popliteal, posterior tibial, and paroneal veins; Tx'd with Rivaroxaban; b.) embolized on 03/21/2020 requiring PA thrombolysis and mechanical thrombectomy on 03/22/2020   Grade I diastolic dysfunction [id number]   History of immunosuppression    long-term MTX for RA diagnosis   History of PSVT (paroxysmal supraventricular tachycardia) 07/22/2019   a.) brief 5 beat run noted on Zio   HLD (hyperlipidemia)    Hypertension    Osteoarthritis     Plantar fascial fibromatosis    Pulmonary embolus ([place]) 03/21/2020   a.) BILATERAL; required PA thrombolysis and mechanical thrombectomy on 03/22/2020; b.) IVC filter placed prior to knee surgery on 12/12/2020 and removed on 04/19/2021   PVC (premature ventricular contraction)    Rheumatoid arthritis (HCC)    Valgus deformity of great toes, bilateral    Vitamin D deficiency    Past Surgical History:  Procedure Laterality Date   ANTERIOR AND POSTERIOR REPAIR N/A 07/23/2021   Procedure: ANTERIOR (CYSTOCELE) AND POSTERIOR REPAIR (RECTOCELE);  Surgeon: Harlin Heys, MD;  Location: [facility];  Service: Gynecology;  Laterality: N/A;   CARDIAC CATHETERIZATION  2006   carpel tunnel Bilateral    CATARACT EXTRACTION, BILATERAL     CHOLECYSTECTOMY N/A 2006   IVC FILTER INSERTION N/A 12/11/2020   Procedure: IVC FILTER INSERTION;  Surgeon: Algernon Huxley, MD;  Location: [facility];  Service: Cardiovascular;  Laterality: N/A;   IVC FILTER REMOVAL N/A 04/19/2021   Procedure: IVC FILTER REMOVAL;  Surgeon: Algernon Huxley, MD;  Location: [place] [facility];  Service: Cardiovascular;  Laterality: N/A;   JOINT REPLACEMENT Right 2021   KNEE   LEFT HEART CATH AND CORONARY ANGIOGRAPHY Left 06/14/2019   Procedure: LEFT HEART CATH AND CORONARY ANGIOGRAPHY;  Surgeon: Wellington Hampshire, MD;  Location: [place] [facility];  Service: Cardiovascular;  Laterality: Left;   PUBOVAGINAL SLING N/A 07/23/2021   Procedure: Gaynelle Arabian;  Surgeon: Harlin Heys, MD;  Location: [facility];  Service: Gynecology;  Laterality: N/A;   PULMONARY THROMBECTOMY  Bilateral 03/22/2020   Procedure: PULMONARY THROMBECTOMY / THROMBOLYSIS;  Surgeon: Algernon Huxley, MD;  Location: [place] [facility];  Service: Cardiovascular;  Laterality: Bilateral;   REPLACEMENT TOTAL KNEE Left 2022   VAGINAL HYSTERECTOMY N/A 07/23/2021   Procedure: HYSTERECTOMY VAGINAL;  Surgeon: Harlin Heys, MD;  Location: [facility];  Service:  Gynecology;  Laterality: N/A;   Social History:  reports that she has never smoked. She has never used smokeless tobacco. She reports current alcohol use. She reports that she does not use drugs.  Allergies  Allergen Reactions   Acetaminophen Other (See Comments)    Tylenol is ineffective   Tramadol Other (See Comments)    Heavily sedated "feels drunk"     Family history reviewed and not pertinent Family History  Problem Relation Age of Onset   Asthma Mother    Asthma Father    Heart failure Father    Leukemia Sister    Breast cancer Neg Hx      Prior to Admission medications   Medication Sig Start Date End Date Taking? Authorizing Provider  albuterol (VENTOLIN HFA) 108 (90 Base) MCG/ACT inhaler Inhale 2 puffs into the lungs every 6 (six) hours as needed for wheezing or shortness of breath. 09/01/20   Parrett, Fonnie Mu, NP  aspirin EC 81 MG tablet Take 81 mg by mouth in the morning. Swallow whole.    [provider]  carvedilol (COREG) 3.125 MG tablet Take 1 tablet (3.125 mg total) by mouth 2 (two) times daily. 06/11/21   Leone Haven, MD  folic acid (FOLVITE) 1 MG tablet Take 1 mg by mouth in the morning. 05/10/21   [provider]  ibuprofen (ADVIL) 600 MG tablet Take 1 tablet (600 mg total) by mouth every 6 (six) hours. 07/25/21   Harlin Heys, MD  methotrexate (RHEUMATREX) 2.5 MG tablet Take 15 mg by mouth every Sunday. 05/10/21   [provider]  naproxen sodium (ALEVE) 220 MG tablet Take 220 mg by mouth daily as needed (pain.).    [provider]  olmesartan-hydrochlorothiazide (BENICAR HCT) 20-12.5 MG tablet TAKE 1 TABLET BY MOUTH ONCE A DAY 04/18/21   Leone Haven, MD    Physical Exam: Vitals:   08/01/21 1400 08/01/21 1530 08/01/21 1609 08/01/21 1630  BP: 112/73 122/62 132/66 118/65  Pulse: 62 66 69 67  Resp: 18 (!) '22 18 17  '$ Temp:      TempSrc:      SpO2: 100% 100% 96% 100%  Weight:      Height:        General:  alert and oriented to time, place, and person. Appear in no distress, affect appropriate Eyes: PERRLA, Conjunctiva normal ENT: Oral Mucosa Clear, moist  Neck: no JVD, no Abnormal Mass Or lumps Cardiovascular: S1 and S2 Present, no Murmur, peripheral pulses symmetrical Respiratory: good respiratory effort, Bilateral Air entry equal and Decreased, no signs of accessory muscle use, Clear to Auscultation, no Crackles, no wheezes Abdomen: Bowel Sound present, Soft and no tenderness, no hernia Skin: no rashes  Extremities: no Pedal edema, no calf tenderness Neurologic: without any new focal findings Gait not checked due to patient safety concerns  Data Reviewed: I have personally reviewed and interpreted labs, imaging as discussed below.  CBC: Recent Labs  Lab 08/01/21 1032  WBC 5.9  HGB 13.4  HCT 39.2  MCV 88.9  PLT [id number]   Basic Metabolic Panel: Recent Labs  Lab 08/01/21 1032  NA 137  K  3.4*  CL 106  CO2 24  GLUCOSE 196*  BUN 22  CREATININE 0.88  CALCIUM 9.1   GFR: Estimated Creatinine Clearance: 64.9 mL/min (by C-G formula based on SCr of 0.88 mg/dL). Liver Function Tests: Recent Labs  Lab 08/01/21 1227  AST 21  ALT 22  ALKPHOS 72  BILITOT 0.8  PROT 7.0  ALBUMIN 3.7   No results for input(s): LIPASE, AMYLASE in the last 168 hours. No results for input(s): AMMONIA in the last 168 hours. Coagulation Profile: No results for input(s): INR, PROTIME in the last 168 hours. Cardiac Enzymes: No results for input(s): CKTOTAL, CKMB, CKMBINDEX, TROPONINI in the last 168 hours. BNP (last 3 results) No results for input(s): PROBNP in the last 8760 hours. HbA1C: No results for input(s): HGBA1C in the last 72 hours. CBG: No results for input(s): GLUCAP in the last 168 hours. Lipid Profile: No results for input(s): CHOL, HDL, LDLCALC, TRIG, CHOLHDL, LDLDIRECT in the last 72 hours. Thyroid Function Tests: No results for input(s): TSH, T4TOTAL, FREET4, T3FREE, THYROIDAB in  the last 72 hours. Anemia Panel: No results for input(s): VITAMINB12, FOLATE, FERRITIN, TIBC, IRON, RETICCTPCT in the last 72 hours. Urine analysis:    Component Value Date/Time   BILIRUBINUR negative 01/26/2021 1542   BILIRUBINUR small 11/30/2020 0931   KETONESUR negative 01/26/2021 1542   PROTEINUR negative 01/26/2021 1542   PROTEINUR Positive (A) 11/30/2020 0931   UROBILINOGEN 1.0 01/26/2021 1542   NITRITE Negative 01/26/2021 1542   NITRITE neg 11/30/2020 0931   LEUKOCYTESUR Negative 01/26/2021 1542    Radiological Exams on Admission: DG Chest 2 View  Result Date: 08/01/2021 CLINICAL DATA:  Chest discomfort and heaviness, atypical chest pain, history of pulmonary embolism in 2021 EXAM: CHEST - 2 VIEW COMPARISON:  08/30/2020 FINDINGS: Normal heart size, mediastinal contours, and pulmonary vascularity. Lungs clear. No pulmonary infiltrate, pleural effusion, or pneumothorax. Osseous structures unremarkable. IMPRESSION: No acute abnormalities. Electronically Signed   By: Lavonia Dana M.D.   On: 08/01/2021 11:38   CT Angio Chest PE W and/or Wo Contrast  Result Date: 08/01/2021 CLINICAL DATA:  Chest heaviness for 3 days EXAM: CT ANGIOGRAPHY CHEST WITH CONTRAST TECHNIQUE: Multidetector CT imaging of the chest was performed using the standard protocol during bolus administration of intravenous contrast. Multiplanar CT image reconstructions and MIPs were obtained to evaluate the vascular anatomy. CONTRAST:  64m OMNIPAQUE IOHEXOL 350 MG/ML SOLN COMPARISON:  Chest x-ray from earlier in the same day, 06/22/2020. FINDINGS: Cardiovascular: Thoracic aorta shows no aneurysmal dilatation. Mild atherosclerotic calcifications are noted. No cardiac enlargement is seen. The pulmonary artery shows a normal branching pattern bilaterally. No focal filling defect to suggest pulmonary embolism is noted. Mediastinum/Nodes: Thoracic inlet is within normal limits. No sizable hilar or mediastinal adenopathy is noted.  The esophagus as visualized is within normal limits. Lungs/Pleura: Lungs are well aerated bilaterally. Scattered small subcentimeter nodules are noted throughout both lungs stable in appearance from the prior exam. No focal infiltrate or sizable effusion is noted. Upper Abdomen: Fatty infiltration of the liver is noted. Gallbladder has been surgically removed. Remainder of the upper abdomen appears within normal limits. Musculoskeletal: Degenerative changes of the thoracic spine are noted. No acute rib abnormality is noted. T11 hemangioma is noted and stable. Review of the MIP images confirms the above findings. IMPRESSION: No evidence of pulmonary emboli. Stable subcentimeter nodules. Given their stability over the past 16 months there felt to be benign in etiology. No further follow-up is recommended. Fatty liver. Aortic Atherosclerosis (ICD10-I70.0).  Electronically Signed   By: Inez Catalina M.D.   On: 08/01/2021 15:05    I reviewed all nursing notes, pharmacy notes, vitals, pertinent old records.  Assessment/Plan Principal Problem:   Chest pain Active Problems:   PVC (premature ventricular contraction)   Palpitations   # Chest pain, ruled out ACS, troponin x2 negative CTA chest ruled out PE Cardiac cath was done 2 years ago, coronaries were clean Resumed aspirin 81 mg daily, continue nitroglycerin as needed for chest pain Started pantoprazole 40 mg p.o. twice daily for GI prophylaxis Patient was feeling chest pressure, choking sensation and palpitations Continue to monitor on telemetry Repeat EKG tomorrow a.m. Keep n.p.o. after midnight Stress test tomorrow a.m. Consult cardiology for further recommendation Follow TSH level   # HTN, HLD Held home medications for now due to low blood pressure and bradycardia We will continue to monitor BP and titrate medications accordingly   # History of DVT and PE s/p IVC filter which was removed, patient completed Xarelto for 1 year, currently  patient is not on any anticoagulation D-dimer elevated, CTA negative for PE   # Rheumatoid arthritis, methotrexate will be resumed on discharge, continued folic acid.    Nutrition: Cardiac diet DVT Prophylaxis: Subcutaneous Lovenox  Advance goals of care discussion: Full code   Consults: cardiology, texted via secure chat   Family Communication: family was present at bedside, at the time of interview.  Opportunity was given to ask question and all questions were answered satisfactorily.  Disposition: Admitted as observation telemetry, med-surge unit. Likely to be discharged Home, in 1-2 days once cleared by cardiology.  I have discussed plan of care as described above with RN and patient/family.  Severity of Illness: The appropriate patient status for this patient is OBSERVATION. Observation status is judged to be reasonable and necessary in order to provide the required intensity of service to ensure the patient's safety. The patient's presenting symptoms, physical exam findings, and initial radiographic and laboratory data in the context of their medical condition is felt to place them at decreased risk for further clinical deterioration. Furthermore, it is anticipated that the patient will be medically stable for discharge from the hospital within 2 midnights of admission. The following factors support the patient status of observation.   " The patient's presenting symptoms include chest pain. " The physical exam findings include palpitations. " The initial radiographic and laboratory data are unremarkable, awaiting for a stress test.   Author: Val Riles, MD [facility] 08/01/2021 5:11 PM   To reach On-call, see care teams to locate the attending and reach out to them via [email or web address]. If 7PM-7AM, please contact night-coverage If you still have difficulty reaching the attending provider, please page the [facility] (Director on Call) for [facility] on amion for  assistance.

## 2021-08-01 NOTE — Telephone Encounter (Signed)
Pt's daughter called this morning to let us know that she was trying to get her mother to go to ER due to low B/P. Pt scheduled for appt this evening with Dr.Evans for a post-op. Pt's daughter states that she has been experiencing low blood pressure since being home. I asked what her blood pressure was : 70/55, P:120; asked if pt was still taking any pain medication as that can decrease blood pressure, pt has not taken any since the day after she got home. I cancelled patients apt and made dr.Evans aware of pt's current situation. I advised the patient and her daughter that she needs to go to ER, if she did come to apt with a blood pressure in that range she would be sent to ER. Pt's daughter was understanding and concerned.

## 2021-08-01 NOTE — ED Notes (Signed)
Dr. Kumar at bedside

## 2021-08-01 NOTE — ED Notes (Signed)
Pt given meal tray.

## 2021-08-01 NOTE — ED Notes (Addendum)
Pt to ED stating she feels heaviness in chest with deep breath. And that when she walks a lot she feels pressure in chest and SOB.  Also states she feels intermittent numbness and tightness in neck and face but not currently (yesterday). Pt could not explain when she felt this but states says that sometimes she thinks is drooling from mouth and face feels wet but it is not.  Pt states last week her blood pressure was low (70/55 at home on Monday, about 1 wk after surgery). Pt took BP meds for 20 years but not taking at this time. Sees cardiologist. Hx bilateral PE last year that required IR procedure. Denies CP.   Pt had hysterectomy 1 wk ago (8/22).

## 2021-08-01 NOTE — ED Triage Notes (Signed)
Pt here with tingling after having a hysterectomy last week. Pt states that her blood pressure dropped while she was in the hospital last week. Pt felt like she had pressure and "ants" on her face. Pt had a PE last year. Pt states that she has chest discomfort. Pt says she feels a lot of pressure in her chest.

## 2021-08-01 NOTE — Telephone Encounter (Signed)
Called to speak with [facility] and connected with her daughter Sunday Spillers. Sunday Spillers states that they are avoiding the Blood pressure medication and the blood pressure has come up to 109/72 with a pulse of 77. Pt did not go to the hospital for evaluation and Sunday Spillers states that they have an appointment with their surgeon today. Pt is going to wait for the surgeons opinion and if they also recommend the hospital, then they will go to be evaluated.

## 2021-08-02 ENCOUNTER — Observation Stay (HOSPITAL_BASED_OUTPATIENT_CLINIC_OR_DEPARTMENT_OTHER): Payer: Medicare Other

## 2021-08-02 ENCOUNTER — Observation Stay (HOSPITAL_BASED_OUTPATIENT_CLINIC_OR_DEPARTMENT_OTHER)
Admit: 2021-08-02 | Discharge: 2021-08-02 | Disposition: A | Discharge status: Home / Self Care | Payer: Medicare Other | Attending: Physician Assistant | Admitting: Physician Assistant

## 2021-08-02 ENCOUNTER — Telehealth: Payer: Self-pay | Admitting: Obstetrics and Gynecology

## 2021-08-02 DIAGNOSIS — Z86711 Personal history of pulmonary embolism: Secondary | ICD-10-CM

## 2021-08-02 DIAGNOSIS — I10 Essential (primary) hypertension: Secondary | ICD-10-CM | POA: Diagnosis not present

## 2021-08-02 DIAGNOSIS — R079 Chest pain, unspecified: Secondary | ICD-10-CM | POA: Diagnosis not present

## 2021-08-02 DIAGNOSIS — R002 Palpitations: Secondary | ICD-10-CM

## 2021-08-02 DIAGNOSIS — I479 Paroxysmal tachycardia, unspecified: Secondary | ICD-10-CM | POA: Diagnosis not present

## 2021-08-02 DIAGNOSIS — I493 Ventricular premature depolarization: Secondary | ICD-10-CM | POA: Diagnosis not present

## 2021-08-02 LAB — NM MYOCAR MULTI W/SPECT W/WALL MOTION / EF
Base ST Depression (mm): 0 mm
Estimated workload: 1
Exercise duration (min): 0 min
Exercise duration (sec): 0 s
LV dias vol: 59 mL (ref 46–106)
LV sys vol: 18 mL
MPHR: 153 {beats}/min
Nuc Stress EF: 69 %
Peak HR: 96 {beats}/min
Percent HR: 62 %
Rest HR: 62 {beats}/min
Rest Nuclear Isotope Dose: 9.9 mCi
SDS: 0
SRS: 7
SSS: 3
ST Depression (mm): 0 mm
Stress Nuclear Isotope Dose: 28.2 mCi
TID: 0.85

## 2021-08-02 LAB — ECHOCARDIOGRAM COMPLETE
Height: 62 in
S' Lateral: 2.5 cm
Weight: 3200 oz

## 2021-08-02 MED ORDER — PANTOPRAZOLE SODIUM 40 MG PO TBEC: 40.0000 mg | DELAYED_RELEASE_TABLET | Freq: Every day | ORAL | 0 refills | Status: DC

## 2021-08-02 MED ORDER — TECHNETIUM TC 99M TETROFOSMIN IV KIT
10.0000 | PACK | Freq: Once | INTRAVENOUS | Status: AC | PRN
  Administered 2021-08-02: 9.92 via INTRAVENOUS
  Filled 2021-08-02: qty 10

## 2021-08-02 MED ORDER — POTASSIUM CHLORIDE CRYS ER 20 MEQ PO TBCR
30.0000 meq | EXTENDED_RELEASE_TABLET | Freq: Two times a day (BID) | ORAL | Status: DC
  Administered 2021-08-02: 30 meq via ORAL
  Filled 2021-08-02: qty 2

## 2021-08-02 MED ORDER — REGADENOSON 0.4 MG/5ML IV SOLN
0.4000 mg | Freq: Once | INTRAVENOUS | Status: AC
  Administered 2021-08-02: 0.4 mg via INTRAVENOUS

## 2021-08-02 MED ORDER — TECHNETIUM TC 99M TETROFOSMIN IV KIT
30.0000 | PACK | Freq: Once | INTRAVENOUS | Status: AC | PRN
  Administered 2021-08-02: 28.2 via INTRAVENOUS
  Filled 2021-08-02: qty 30

## 2021-08-02 MED ORDER — CARVEDILOL 6.25 MG PO TABS
6.2500 mg | ORAL_TABLET | Freq: Two times a day (BID) | ORAL | 0 refills | Status: DC
Start: 2021-08-02 — End: 2021-08-28

## 2021-08-02 NOTE — ED Notes (Signed)
Pt ambulating at this time independently.

## 2021-08-02 NOTE — Telephone Encounter (Signed)
Sunday Spillers (daughter) called in to let Dr. Amalia Hailey know everything was ok with Lynn Ito and she is doing ok.  Sunday Spillers called to reschedule [facility]'s post -op but you have no availability until 2.5 weeks out.  I am sure you want to see her sooner than that.  What day and time do you suggest I schedule [facility]?  Thank you!!!!

## 2021-08-02 NOTE — Discharge Summary (Signed)
[facility] Discharge Summary   Patient: Cindy Stein [id number]  PCP: Cindy Haven, MD  Date of admission: 08/01/2021   Date of discharge:  08/02/2021     Discharge Diagnoses:  Principal Problem:   Chest pain Active Problems:   PVC (premature ventricular contraction)   Palpitations   Admitted From: Home Disposition:  Home   Recommendations for Outpatient Follow-up:  PCP: in 1 wk monitor  BP and HR Follow up LABS/TEST:     Diet recommendation: Cardiac diet  Activity: The patient is advised to gradually reintroduce usual activities, as tolerated  Discharge Condition: stable  Code Status: Full code   History of present illness: As per the H and P dictated on admission Hospital Course: Cindy Stein is a 64 y.o. female with Past medical history of HTN, HLD, left lower extremity DVT, pulmonary embolism, status post IVC filter which was removed, rheumatoid arthritis on methotrexate, hysterectomy on 07/23/2021, presented with chest pain off and on, more on exertion, feels chest pressure and throat tightness which resolves with rest.  Patient also noticed palpitations on Monday, EMS was called, patient does not wanted to come to the hospital.  Patient took recommended morning and then stopped blood pressure medications due to soft blood pressure.  Currently patient is chest pain-free but she had chest pressure yesterday morning.  Patient was concerned about heart rate take, she had DVT and PE so she came in for further evaluation. ED Course: EKG no significant changes, D-dimer 1.9 elevated, CTA chest ruled out PE Assessment/Plan Principal Problem:   Chest pain Active Problems:   PVC (premature ventricular contraction)   Palpitations # Chest pain, ruled out ACS, troponin x2 negative. CTA chest ruled out PE, TSH within normal range Cardiac cath was done 2 years ago, coronaries were clean. Resumed aspirin 81 mg daily.  No more chest pain during hospital stay.  Patient  was given PPI twice daily.  Discharged on PPI 40 mg daily.  Patient remained chest pain-free.  Recommend on overnight monitor.  Patient was seen by cardiologist, stress test was done which was negative.  Echocardiogram was done I do not see the report by myself.  Patient was cleared by cardiology to discharge on Coreg 6.25 twice daily and resume olmesartan-hydrochlorothiazide PTA dose.  Calcium slightly low which was repleted before discharge.  Patient was advised to follow PCP and cardiology as an outpatient.  Patient may need cardiac monitor if persistent issues with palpitations. # HTN, HLD, held home medications due to concern of low blood pressure and bradycardia.  Seen by cardiology, recommended to resume home medications and increased Coreg from 3.125-6.25 twice daily due to palpitations possible PVC/SVT/paroxysmal tachycardia, it will be managed by Cindy Stein and patient may need Zio patch as an outpatient. # History of DVT and PE s/p IVC filter which was removed, patient completed Xarelto for 1 year, currently patient is not on any anticoagulation. D-dimer elevated, CTA negative for PE # Rheumatoid arthritis, resumed detracted and folic acid PTA dose Body mass index is 36.58 kg/m.  Nutrition Interventions:     - Patient was instructed, not to drive, operate heavy machinery, perform activities at heights, swimming or participation in water activities or provide baby sitting services while on Pain, Sleep and Anxiety Medications; until her outpatient Physician has advised to do so again.  - Also recommended to not to take more than prescribed Pain, Sleep and Anxiety Medications.  Patient was ambulatory without any assistance. On the day of the discharge the  patient's vitals were stable, and no other acute medical condition were reported by patient. the patient was felt safe to be discharge at Home.  Consultants: Cardiologist Procedures: NM cardiac Stress test negative    Discharge  Exam: General: Appear in no distress, no Rash; Oral Mucosa Clear, moist. Cardiovascular: S1 and S2 Present, no Murmur, Respiratory: normal respiratory effort, Bilateral Air entry present and no Crackles, no wheezes Abdomen: Bowel Sound present, Soft and no tenderness, no hernia Extremities: no Pedal edema, no calf tenderness Neurology: alert and oriented to time, place, and person affect appropriate.  Filed Weights   08/01/21 1030  Weight: 90.7 kg   Vitals:   08/02/21 0340 08/02/21 0745  BP: (!) 107/59 123/70  Pulse: 70 66  Resp: (!) 21 18  Temp:    SpO2: 98% 100%    DISCHARGE MEDICATION: Allergies as of 08/02/2021       Reactions   Acetaminophen Other (See Comments)   Tylenol is ineffective   Tramadol Other (See Comments)   Heavily sedated "feels drunk"        Medication List     TAKE these medications    albuterol 108 (90 Base) MCG/ACT inhaler Commonly known as: VENTOLIN HFA Inhale 2 puffs into the lungs every 6 (six) hours as needed for wheezing or shortness of breath.   aspirin EC 81 MG tablet Take 81 mg by mouth in the morning. Swallow whole.   carvedilol 6.25 MG tablet Commonly known as: COREG Take 1 tablet (6.25 mg total) by mouth 2 (two) times daily. What changed:  medication strength how much to take   folic acid 1 MG tablet Commonly known as: FOLVITE Take 1 mg by mouth in the morning.   ibuprofen 600 MG tablet Commonly known as: ADVIL Take 1 tablet (600 mg total) by mouth every 6 (six) hours.   methotrexate 2.5 MG tablet Commonly known as: RHEUMATREX Take 15 mg by mouth every Sunday.   naproxen sodium 220 MG tablet Commonly known as: ALEVE Take 220 mg by mouth daily as needed (pain.).   olmesartan-hydrochlorothiazide 20-12.5 MG tablet Commonly known as: BENICAR HCT TAKE 1 TABLET BY MOUTH ONCE A DAY   pantoprazole 40 MG tablet Commonly known as: PROTONIX Take 1 tablet (40 mg total) by mouth daily.       Allergies  Allergen  Reactions   Acetaminophen Other (See Comments)    Tylenol is ineffective   Tramadol Other (See Comments)    Heavily sedated "feels drunk"   Discharge Instructions     Call MD for:   Complete by: As directed    Chest pressure and palpitations   Diet - low sodium heart healthy   Complete by: As directed    Discharge instructions   Complete by: As directed    Follow-up with PCP in 1 week, continue to monitor BP and heart rate at home Follow with cardiology in 1 week, patient may need Zio patch as an outpatient for arrhythmias   Increase activity slowly   Complete by: As directed        The results of significant diagnostics from this hospitalization (including imaging, microbiology, ancillary and laboratory) are listed below for reference.    Significant Diagnostic Studies: DG Chest 2 View  Result Date: 08/01/2021 CLINICAL DATA:  Chest discomfort and heaviness, atypical chest pain, history of pulmonary embolism in 2021 EXAM: CHEST - 2 VIEW COMPARISON:  08/30/2020 FINDINGS: Normal heart size, mediastinal contours, and pulmonary vascularity. Lungs clear. No pulmonary infiltrate, pleural  effusion, or pneumothorax. Osseous structures unremarkable. IMPRESSION: No acute abnormalities. Electronically Signed   By: Lavonia Dana M.D.   On: 08/01/2021 11:38   CT Angio Chest PE W and/or Wo Contrast  Result Date: 08/01/2021 CLINICAL DATA:  Chest heaviness for 3 days EXAM: CT ANGIOGRAPHY CHEST WITH CONTRAST TECHNIQUE: Multidetector CT imaging of the chest was performed using the standard protocol during bolus administration of intravenous contrast. Multiplanar CT image reconstructions and MIPs were obtained to evaluate the vascular anatomy. CONTRAST:  55m OMNIPAQUE IOHEXOL 350 MG/ML SOLN COMPARISON:  Chest x-ray from earlier in the same day, 06/22/2020. FINDINGS: Cardiovascular: Thoracic aorta shows no aneurysmal dilatation. Mild atherosclerotic calcifications are noted. No cardiac enlargement is  seen. The pulmonary artery shows a normal branching pattern bilaterally. No focal filling defect to suggest pulmonary embolism is noted. Mediastinum/Nodes: Thoracic inlet is within normal limits. No sizable hilar or mediastinal adenopathy is noted. The esophagus as visualized is within normal limits. Lungs/Pleura: Lungs are well aerated bilaterally. Scattered small subcentimeter nodules are noted throughout both lungs stable in appearance from the prior exam. No focal infiltrate or sizable effusion is noted. Upper Abdomen: Fatty infiltration of the liver is noted. Gallbladder has been surgically removed. Remainder of the upper abdomen appears within normal limits. Musculoskeletal: Degenerative changes of the thoracic spine are noted. No acute rib abnormality is noted. T11 hemangioma is noted and stable. Review of the MIP images confirms the above findings. IMPRESSION: No evidence of pulmonary emboli. Stable subcentimeter nodules. Given their stability over the past 16 months there felt to be benign in etiology. No further follow-up is recommended. Fatty liver. Aortic Atherosclerosis (ICD10-I70.0). Electronically Signed   By: MInez CatalinaM.D.   On: 08/01/2021 15:05   NM Myocar Multi W/Spect W/Wall Motion / EF  Result Date: 08/02/2021   The study is normal. The study is low risk.   No ST deviation was noted.   Left ventricular function is normal.   Prior study not available for comparison. Pharmacological myocardial perfusion imaging study with no significant  ischemia Normal wall motion, EF estimated at 79% No EKG changes concerning for ischemia at peak stress or in recovery. CT attenuation correction images with no coronary calcification, minimal aortic atherosclerosis Low risk scan Signed, TEsmond Plants MD, Ph.D [facility]    Microbiology: Recent Results (from the past 240 hour(s))  Resp Panel by RT-PCR (Flu A&B, Covid) Nasopharyngeal Swab     Status: None   Collection Time: 08/01/21 12:42 PM    Specimen: Nasopharyngeal Swab; Nasopharyngeal(NP) swabs in vial transport medium  Result Value Ref Range Status   SARS Coronavirus 2 by RT PCR NEGATIVE NEGATIVE Final    Comment: (NOTE) SARS-CoV-2 target nucleic acids are NOT DETECTED.  The SARS-CoV-2 RNA is generally detectable in upper respiratory specimens during the acute phase of infection. The lowest concentration of SARS-CoV-2 viral copies this assay can detect is 138 copies/mL. A negative result does not preclude SARS-Cov-2 infection and should not be used as the sole basis for treatment or other patient management decisions. A negative result may occur with  improper specimen collection/handling, submission of specimen other than nasopharyngeal swab, presence of viral mutation(s) within the areas targeted by this assay, and inadequate number of viral copies(<138 copies/mL). A negative result must be combined with clinical observations, patient history, and epidemiological information. The expected result is Negative.  Fact Sheet for Patients:  hEntrepreneurPulse.com.au Fact Sheet for Healthcare Providers:  hIncredibleEmployment.be This test is no t yet approved or  cleared by the [place] and  has been authorized for detection and/or diagnosis of SARS-CoV-2 by FDA under an Emergency Use Authorization (EUA). This EUA will remain  in effect (meaning this test can be used) for the duration of the COVID-19 declaration under Section 564(b)(1) of the Act, 21 U.S.C.section 360bbb-3(b)(1), unless the authorization is terminated  or revoked sooner.       Influenza A by PCR NEGATIVE NEGATIVE Final   Influenza B by PCR NEGATIVE NEGATIVE Final    Comment: (NOTE) The Xpert Xpress SARS-CoV-2/FLU/RSV plus assay is intended as an aid in the diagnosis of influenza from Nasopharyngeal swab specimens and should not be used as a sole basis for treatment. Nasal washings and aspirates are  unacceptable for Xpert Xpress SARS-CoV-2/FLU/RSV testing.  Fact Sheet for Patients: EntrepreneurPulse.com.au  Fact Sheet for Healthcare Providers: IncredibleEmployment.be  This test is not yet approved or cleared by the [place] FDA and has been authorized for detection and/or diagnosis of SARS-CoV-2 by FDA under an Emergency Use Authorization (EUA). This EUA will remain in effect (meaning this test can be used) for the duration of the COVID-19 declaration under Section 564(b)(1) of the Act, 21 U.S.C. section 360bbb-3(b)(1), unless the authorization is terminated or revoked.  Performed at [facility], [address] [place], [place] [address]      Labs: CBC: Recent Labs  Lab 08/01/21 1032  WBC 5.9  HGB 13.4  HCT 39.2  MCV 88.9  PLT [id number]   Basic Metabolic Panel: Recent Labs  Lab 08/01/21 1032  NA 137  K 3.4*  CL 106  CO2 24  GLUCOSE 196*  BUN 22  CREATININE 0.88  CALCIUM 9.1   Liver Function Tests: Recent Labs  Lab 08/01/21 1227  AST 21  ALT 22  ALKPHOS 72  BILITOT 0.8  PROT 7.0  ALBUMIN 3.7   No results for input(s): LIPASE, AMYLASE in the last 168 hours. No results for input(s): AMMONIA in the last 168 hours. Cardiac Enzymes: No results for input(s): CKTOTAL, CKMB, CKMBINDEX, TROPONINI in the last 168 hours. BNP (last 3 results) No results for input(s): BNP in the last 8760 hours. CBG: No results for input(s): GLUCAP in the last 168 hours.  Time spent: 35 minutes  Signed:  Val Riles  [facility]  08/02/2021 2:18 PM

## 2021-08-02 NOTE — Progress Notes (Signed)
IV removed from patient. Discharge instructions given to patient. Verbalized understanding. No acute distress at this time. Awaiting on daughter to p/u for ride home.

## 2021-08-02 NOTE — ED Notes (Signed)
Pt ambulating in hallway without difficulty.

## 2021-08-02 NOTE — ED Notes (Signed)
Patient is resting comfortably.Denies further need at this time.

## 2021-08-02 NOTE — Consult Note (Signed)
Cardiology Consultation:   Patient ID: Cindy Stein MRN: [id number]; DOB: Jan 24, 1956  Admit date: 08/01/2021 Date of Consult: 08/02/2021  PCP:  Leone Haven, MD   [place]  Cardiologist:  [name], MD  Advanced Practice Provider:  No care team member to display Electrophysiologist:  None        Patient Profile:   Cindy Stein is a 65 y.o. 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 Dwyane Dee.  History of Present Illness:   Cindy Stein is a 65 year old female with PMH as above.  [place] 06/2019 with nl cors, preserved LVEF, mildly elevated LVEDP.  Echo 07/2020 with LVEF 60 to 65%, NR WMA, normal diastolic parameters.    She was last seen in office 07/05/2021 for preoperative cardiac evaluation before hysterectomy 8/22.  It was noted that she was no longer on Xarelto and had transition to ASA per MD instruction and s/p IVC filter removal 04/2021. She was otherwise doing well from a cardiac standpoint and proceeded with surgery/hysterectomy on 07/23/2021.  On 07/30/2021 she called the office with report of low BP, racing heart rate, and palpitations since her hysterectomy.  No associated presyncope or syncope. She was not taking any narcotic pain medications after surgery.  She had only taken 1 tablet of 600 mg ibuprofen. She was drinking water and tea, reporting adequate hydration; however, she had not had very much food.  She did report low blood pressure both before anesthesia and following anesthesia.  No asymmetric lower extremity swelling is in the past before her DVT.  She did report some chest tightness and shortness of breath, unchanged from that experience before her [facility]  06/2019.  Chest tightness was further described as a pressure that spread across her anterior chest and occasionally radiated into her bilateral jaw.   Of note, in the past, she reports 1 episode of a poor reaction to anesthesia, which resulted in emesis.  Otherwise, she denied any previous poor reactions to anesthesia.  She was not nauseated with the anesthesia from her hysterectomy.   She called both the [place] office and her PCP.  She reported BP 89/61 with HR 100 -120bpm.  She was advised to stay well-hydrated and make slow position changes.  Benicar was held, which she had been taking since discharge after her hysterectomy. When her BP continued to stay low, and after her daughter noticed that she looked pale/not well, she decided to call EMS.  She reports EMS stayed with her for an hour and monitored her low BP.  In the emergency department, BP 125/69 with HR 80 bpm.  Labs showed potassium 3.4, glucose 196, and D-dimer 1.91.  EKG NSR without acute ST/T changes.  CXR without acute abnormalities.  Chest CT without evidence of pulmonary emboli.  Stable subcentimeter nodules noted to be stable over the past 16 months and thought benign in etiology without further follow-up recommended.  Aortic atherosclerosis and fatty liver noted.   Past Medical History:  Diagnosis Date   Anxiety    Atypical chest pain    DOE (dyspnea on exertion)    DVT of lower extremity (deep venous thrombosis) ([place]) 03/20/2020   a.) LEFT femoral, popliteal, posterior tibial, and paroneal veins; Tx'd with Rivaroxaban;  b.) embolized on 03/21/2020 requiring PA thrombolysis and mechanical thrombectomy on 03/22/2020   Grade I diastolic dysfunction [id number]   History of immunosuppression    long-term MTX for RA diagnosis   History of PSVT (paroxysmal supraventricular tachycardia) 07/22/2019   a.) brief 5 beat run noted on Zio   HLD (hyperlipidemia)    Hypertension    Osteoarthritis    Plantar fascial fibromatosis     Pulmonary embolus ([place]) 03/21/2020   a.) BILATERAL; required PA thrombolysis and mechanical thrombectomy on 03/22/2020; b.) IVC filter placed prior to knee surgery on 12/12/2020 and removed on 04/19/2021   PVC (premature ventricular contraction)    Rheumatoid arthritis (HCC)    Valgus deformity of great toes, bilateral    Vitamin D deficiency     Past Surgical History:  Procedure Laterality Date   ANTERIOR AND POSTERIOR REPAIR N/A 07/23/2021   Procedure: ANTERIOR (CYSTOCELE) AND POSTERIOR REPAIR (RECTOCELE);  Surgeon: Harlin Heys, MD;  Location: [facility] ORS;  Service: Gynecology;  Laterality: N/A;   CARDIAC CATHETERIZATION  2006   carpel tunnel Bilateral    CATARACT EXTRACTION, BILATERAL     CHOLECYSTECTOMY N/A 2006   IVC FILTER INSERTION N/A 12/11/2020   Procedure: IVC FILTER INSERTION;  Surgeon: Algernon Huxley, MD;  Location: [place] [facility];  Service: Cardiovascular;  Laterality: N/A;   IVC FILTER REMOVAL N/A 04/19/2021   Procedure: IVC FILTER REMOVAL;  Surgeon: Algernon Huxley, MD;  Location: [place] [facility];  Service: Cardiovascular;  Laterality: N/A;   JOINT REPLACEMENT Right 2021   KNEE   LEFT HEART CATH AND CORONARY ANGIOGRAPHY Left 06/14/2019   Procedure: LEFT HEART CATH AND CORONARY ANGIOGRAPHY;  Surgeon: Wellington Hampshire, MD;  Location: [place] [facility];  Service: Cardiovascular;  Laterality: Left;   PUBOVAGINAL SLING N/A 07/23/2021   Procedure: Gaynelle Arabian;  Surgeon: Harlin Heys, MD;  Location: [facility] ORS;  Service: Gynecology;  Laterality: N/A;   PULMONARY THROMBECTOMY Bilateral 03/22/2020   Procedure: PULMONARY THROMBECTOMY / THROMBOLYSIS;  Surgeon: Algernon Huxley, MD;  Location: [place] [facility];  Service: Cardiovascular;  Laterality: Bilateral;   REPLACEMENT TOTAL KNEE Left 2022   VAGINAL HYSTERECTOMY N/A 07/23/2021   Procedure: HYSTERECTOMY VAGINAL;  Surgeon: Harlin Heys, MD;  Location: [facility] ORS;  Service: Gynecology;  Laterality: N/A;      Home Medications:  Prior to Admission medications   Medication Sig Start Date End Date Taking? Authorizing Provider  carvedilol (COREG) 3.125 MG tablet Take 1 tablet (3.125 mg total) by mouth 2 (two) times daily. 06/11/21  Yes Leone Haven, MD  ibuprofen (ADVIL) 600 MG tablet Take 1 tablet (600 mg total) by mouth every 6 (six) hours. 07/25/21  Yes Harlin Heys, MD  olmesartan-hydrochlorothiazide (BENICAR HCT) 20-12.5 MG tablet TAKE 1 TABLET BY MOUTH ONCE A DAY 04/18/21  Yes Leone Haven, MD  albuterol (VENTOLIN HFA) 108 (90 Base) MCG/ACT inhaler Inhale 2 puffs into the lungs every 6 (six) hours as needed for wheezing or shortness of breath. 09/01/20   Parrett, Fonnie Mu, NP  aspirin EC 81 MG tablet Take 81 mg by mouth in the morning. Swallow whole.    [provider]  folic acid (FOLVITE) 1 MG tablet Take 1 mg by mouth in the morning. 05/10/21   [provider]  methotrexate (RHEUMATREX) 2.5 MG tablet Take 15 mg by mouth every Sunday. 05/10/21   [provider]  naproxen sodium (ALEVE) 220 MG tablet Take 220 mg by  mouth daily as needed (pain.).    [provider]    Inpatient Medications: Scheduled Meds:  aspirin EC  81 mg Oral q AM   enoxaparin (LOVENOX) injection  0.5 mg/kg Subcutaneous [id number]   folic acid  1 mg Oral Daily   pantoprazole  40 mg Oral BID   Continuous Infusions:  sodium chloride 75 mL/hr at 08/02/21 0337   PRN Meds: acetaminophen, albuterol, nitroGLYCERIN, ondansetron (ZOFRAN) IV  Allergies:    Allergies  Allergen Reactions   Acetaminophen Other (See Comments)    Tylenol is ineffective   Tramadol Other (See Comments)    Heavily sedated "feels drunk"    Social History:   Social History   Socioeconomic History   Marital status: Married    Spouse name: Jeno   Number of children: 1   Years of education: Not on file   Highest education level: Not on file  Occupational History   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  Tobacco Use   Smoking status: Never   Smokeless tobacco: Never  Vaping Use   Vaping Use: Never used  Substance and Sexual Activity   Alcohol use: Yes    Comment: OCCAS   Drug use: Never   Sexual activity: Not Currently  Other Topics Concern   Not on file  Social History Narrative   Lives at home with spouse   Social Determinants of Health   Financial Resource Strain: Low Risk    Difficulty of Paying Living Expenses: Not hard at all  Food Insecurity: No Food Insecurity   Worried About Charity fundraiser in the Last Year: Never true   Arboriculturist in the Last Year: Never true  Transportation Needs: No Transportation Needs   Lack of Transportation (Medical): No   Lack of Transportation (Non-Medical): No  Physical Activity: Sufficiently Active   Days of Exercise per Week: 5 days   Minutes of Exercise per Session: 30 min  Stress: No Stress Concern Present   Feeling of Stress : Not at all  Social Connections: Unknown   Frequency of Communication with Friends and Family: More than three times a week   Frequency of Social Gatherings with Friends and Family: More than three times a week   Attends Religious Services: Not on Electrical engineer or Organizations: Not on file   Attends Archivist Meetings: Not on file   Marital Status: Married  Human resources officer Violence: Not At Risk   Fear of Current or Ex-Partner: No   Emotionally Abused: No   Physically Abused: No   Sexually Abused: No    Family History:    Family History  Problem Relation Age of Onset   Asthma Mother    Asthma Father    Heart failure Father    Leukemia Sister    Breast cancer Neg Hx      ROS:  Please see the history of present illness.  Review of Systems  Respiratory:  Positive for shortness of breath.   Cardiovascular:  Positive for chest pain and palpitations. Negative for leg swelling and PND.  Gastrointestinal:  Negative for abdominal pain, nausea and  vomiting.  Musculoskeletal:  Negative for falls.  Neurological:  Negative for dizziness.  All other systems reviewed and are negative.  All other ROS reviewed and negative.     Physical Exam/Data:   Vitals:   08/02/21 0230 08/02/21 0300 08/02/21 0340 08/02/21 0745  BP: 116/70 121/68 Marland Kitchen)  107/59 123/70  Pulse: 70 64 70   Resp: (!) 22 (!) 21 (!) 21 18  Temp:      TempSrc:      SpO2: 94% 95% 98% 100%  Weight:      Height:       No intake or output data in the 24 hours ending 08/02/21 0800 Last 3 Weights 08/01/2021 07/23/2021 07/13/2021  Weight (lbs) 200 lb 200 lb 203 lb  Weight (kg) 90.719 kg 90.719 kg 92.08 kg     Body mass index is 36.58 kg/m.  General:  Well nourished, well developed, in no acute distress.  Seated in the chair next to bed. HEENT: normal Lymph: no adenopathy Neck: no JVD Endocrine:  No thryomegaly Vascular: No carotid bruits; FA pulses 2+ bilaterally without bruits  Cardiac:  normal S1, S2; RRR; no murmur  Lungs:  clear to auscultation bilaterally, no wheezing, rhonchi or rales  Abd: soft, nontender, no hepatomegaly  Ext: trace bilateral LEE Musculoskeletal:  No deformities, BUE and BLE strength normal and equal Skin: warm and dry  Neuro:  CNs 2-12 intact, no focal abnormalities noted Psych:  Normal affect   EKG:  The EKG was personally reviewed and demonstrates: NSR, 71 bpm, first-degree AV block no acute ST/T changes Telemetry:  Telemetry was personally reviewed and demonstrates: NSR  Relevant CV Studies:  Echo 07/2020  1. Left ventricular ejection fraction, by estimation, is 60 to 65%. The  left ventricle has normal function. Left ventricular endocardial border  not optimally defined to evaluate regional wall motion. Left ventricular  diastolic parameters are consistent  with Grade I diastolic dysfunction (impaired relaxation).   2. Right ventricular systolic function is low normal. The right  ventricular size is normal. There is normal pulmonary  artery systolic  pressure.   3. The mitral valve is grossly normal. No evidence of mitral valve  regurgitation.   4. The aortic valve is tricuspid. Aortic valve regurgitation is not  visualized. No aortic stenosis is present.   5. The inferior vena cava is normal in size with greater than 50%  respiratory variability, suggesting right atrial pressure of 3 mmHg.    Heart monitor 07/2019 7-day Zio patch monitor:   Normal sinus rhythm with an average heart rate of 73 bpm. 5 beat run of SVT. Rare PACs with no evidence of PVCs.   Overall, unremarkable monitor.   LHC 06/2019 The left ventricular systolic function is normal. LV end diastolic pressure is mildly elevated. The left ventricular ejection fraction is 55-65% by visual estimate.   1.  Normal coronary arteries. 2.  Normal LV systolic function.  Mildly elevated left ventricular end-diastolic pressure post frequent PVCs.   Recommendations: Noncardiac chest pain.      Laboratory Data:  High Sensitivity Troponin:   Recent Labs  Lab 08/01/21 1032 08/01/21 1227  TROPONINIHS 5 5     Chemistry Recent Labs  Lab 08/01/21 1032  NA 137  K 3.4*  CL 106  CO2 24  GLUCOSE 196*  BUN 22  CREATININE 0.88  CALCIUM 9.1  GFRNONAA >60  ANIONGAP 7    Recent Labs  Lab 08/01/21 1227  PROT 7.0  ALBUMIN 3.7  AST 21  ALT 22  ALKPHOS 72  BILITOT 0.8   Hematology Recent Labs  Lab 08/01/21 1032  WBC 5.9  RBC 4.41  HGB 13.4  HCT 39.2  MCV 88.9  MCH 30.4  MCHC 34.2  RDW 13.7  PLT 235   BNPNo results for input(s):  BNP, PROBNP in the last 168 hours.  DDimer  Recent Labs  Lab 08/01/21 1227  DDIMER 1.91*     Radiology/Studies:  DG Chest 2 View  Result Date: 08/01/2021 CLINICAL DATA:  Chest discomfort and heaviness, atypical chest pain, history of pulmonary embolism in 2021 EXAM: CHEST - 2 VIEW COMPARISON:  08/30/2020 FINDINGS: Normal heart size, mediastinal contours, and pulmonary vascularity. Lungs clear. No  pulmonary infiltrate, pleural effusion, or pneumothorax. Osseous structures unremarkable. IMPRESSION: No acute abnormalities. Electronically Signed   By: Lavonia Dana M.D.   On: 08/01/2021 11:38   CT Angio Chest PE W and/or Wo Contrast  Result Date: 08/01/2021 CLINICAL DATA:  Chest heaviness for 3 days EXAM: CT ANGIOGRAPHY CHEST WITH CONTRAST TECHNIQUE: Multidetector CT imaging of the chest was performed using the standard protocol during bolus administration of intravenous contrast. Multiplanar CT image reconstructions and MIPs were obtained to evaluate the vascular anatomy. CONTRAST:  60m OMNIPAQUE IOHEXOL 350 MG/ML SOLN COMPARISON:  Chest x-ray from earlier in the same day, 06/22/2020. FINDINGS: Cardiovascular: Thoracic aorta shows no aneurysmal dilatation. Mild atherosclerotic calcifications are noted. No cardiac enlargement is seen. The pulmonary artery shows a normal branching pattern bilaterally. No focal filling defect to suggest pulmonary embolism is noted. Mediastinum/Nodes: Thoracic inlet is within normal limits. No sizable hilar or mediastinal adenopathy is noted. The esophagus as visualized is within normal limits. Lungs/Pleura: Lungs are well aerated bilaterally. Scattered small subcentimeter nodules are noted throughout both lungs stable in appearance from the prior exam. No focal infiltrate or sizable effusion is noted. Upper Abdomen: Fatty infiltration of the liver is noted. Gallbladder has been surgically removed. Remainder of the upper abdomen appears within normal limits. Musculoskeletal: Degenerative changes of the thoracic spine are noted. No acute rib abnormality is noted. T11 hemangioma is noted and stable. Review of the MIP images confirms the above findings. IMPRESSION: No evidence of pulmonary emboli. Stable subcentimeter nodules. Given their stability over the past 16 months there felt to be benign in etiology. No further follow-up is recommended. Fatty liver. Aortic Atherosclerosis  (ICD10-I70.0). Electronically Signed   By: MInez CatalinaM.D.   On: 08/01/2021 15:05     Assessment and Plan:   Chest Pain -- No CP today with last episode last night. Reports unchanged from that before her 06/2019 LHC with nl cors & CP attributed to noncardiac etiology.  Tn 5 x2; EKG without acute ST/T changes.  Dimer 1.9; CT without evidence of PE.  Previous echo with nl LVEF. Suspect CP noncardiac given unchanged from previous.  Considered is GI etiology versus tachypalpitations versus anxiety.  IM ordered a stress test for this morning. If stress test and echo without acute findings, she can likely be discharged home and follow-up in the office.  Tachypalpitations --No further tachypalpitations since presenting to the ED.  PVCs noted during 06/2019 cath with following monitor showing NSR and 1 episode of SVT.  She reports tachypalpitations surrounding surgery - considered is low intake of food, stress of surgery. No associated n/v with anesthesia, no v/d since surgery. Also considered is low K with K 3.4 at presentation.  Check TSH, Mg.  Daily BMET. Will obtain echo, stress test as above.  If further tachypalpitations, consider repeat cardiac monitor.  Patient reported low BP --BP stable since presenting to the ED.  Denies any presyncope or syncope with her lower BP.  Considered is a false reading from her BP cuff, given her report of tachypalpitations as above.  Discussed proper hydration, slow position  changes. Labs as above.   Elevated D-dimer --D-dimer 1.91 with CT without evidence of PE. No current CP, SOB. No asymmetric lower extremity swelling.  She does have a history of DVT s/p IVC filter removal and transition of Xarelto to ASA daily. Further workup if indicated per IM.    For questions or updates, please contact [place] Please consult [email or web address] for contact info under    Signed, Arvil Chaco, PA-C  08/02/2021 8:00 AM

## 2021-08-02 NOTE — ED Notes (Signed)
Dietary contacted to place order for lunch tray

## 2021-08-02 NOTE — Progress Notes (Signed)
Patient remains in stress test.

## 2021-08-02 NOTE — Progress Notes (Signed)
*  PRELIMINARY RESULTS* Echocardiogram 2D Echocardiogram has been performed.  Cindy Stein 08/02/2021, 1:45 PM

## 2021-08-07 ENCOUNTER — Other Ambulatory Visit: Payer: Self-pay

## 2021-08-07 ENCOUNTER — Ambulatory Visit: Payer: Medicare Other | Admitting: Cardiovascular Disease

## 2021-08-07 ENCOUNTER — Encounter: Payer: Self-pay | Admitting: Cardiovascular Disease

## 2021-08-07 VITALS — BP 126/78 | HR 61 | Ht 62.0 in | Wt 201.5 lb

## 2021-08-07 DIAGNOSIS — R0789 Other chest pain: Secondary | ICD-10-CM

## 2021-08-07 DIAGNOSIS — I10 Essential (primary) hypertension: Secondary | ICD-10-CM

## 2021-08-07 DIAGNOSIS — R002 Palpitations: Secondary | ICD-10-CM

## 2021-08-07 NOTE — Patient Instructions (Signed)

## 2021-08-07 NOTE — Progress Notes (Signed)
Cardiology Office Note   Date:  08/07/2021   ID:  Kinberly Stein, DOB 1956-09-07, MRN [id number]  PCP:  Leone Haven, MD  Cardiologist:   [name], MD   Chief Complaint  Patient presents with   Other    F/u hosp. CP/HTN no complaints today. Meds reviewed verbally with pt.      History of Present Illness: Cindy Stein is a 65 y.o. female who presents for a follow-up visit regarding chest pain and palpitations.    She has known history of essential hypertension, hyperlipidemia and obesity.  She also has strong family history of coronary artery disease affecting almost every family member.    Due to recurrent atypical chest pain, she underwent cardiac catheterization in July 2020 which showed normal coronary arteries, normal LV systolic function and mildly elevated left ventricular end-diastolic pressure.    She underwent a 7-day ZIO patch monitor which showed sinus rhythm with no evidence of PVCs.  She had 1 short run of SVT that lasted 5 beats.  She had multiple surgeries done this year including right knee replacement, IVC filter placement with subsequent removal, eye surgery and most recently hysterectomy.  She had episodes of tachycardia and hypotension with weakness.  She also started having episodes of chest tightness in the setting of palpitations.  Thus, she was hospitalized briefly at [facility].  Troponin was negative.  No significant arrhythmia noted.  She underwent a Lexiscan Myoview which showed no evidence of ischemia with normal ejection fraction.  CTA of the chest showed no evidence of pulmonary embolism.  The dose of carvedilol was increased to 6.25 mg twice daily.  She has been doing significantly better since she was discharged with no episodes of hypotension or tachycardia.  She does complain of feeling significantly stressed and anxious.  Past Medical History:  Diagnosis Date   Anxiety    Atypical chest pain    DOE (dyspnea on exertion)    DVT of lower  extremity (deep venous thrombosis) ([place]) 03/20/2020   a.) LEFT femoral, popliteal, posterior tibial, and paroneal veins; Tx'd with Rivaroxaban; b.) embolized on 03/21/2020 requiring PA thrombolysis and mechanical thrombectomy on 03/22/2020   Grade I diastolic dysfunction [id number]   History of immunosuppression    long-term MTX for RA diagnosis   History of PSVT (paroxysmal supraventricular tachycardia) 07/22/2019   a.) brief 5 beat run noted on Zio   HLD (hyperlipidemia)    Hypertension    Osteoarthritis    Plantar fascial fibromatosis    Pulmonary embolus ([place]) 03/21/2020   a.) BILATERAL; required PA thrombolysis and mechanical thrombectomy on 03/22/2020; b.) IVC filter placed prior to knee surgery on 12/12/2020 and removed on 04/19/2021   PVC (premature ventricular contraction)    Rheumatoid arthritis (HCC)    Valgus deformity of great toes, bilateral    Vitamin D deficiency     Past Surgical History:  Procedure Laterality Date   ANTERIOR AND POSTERIOR REPAIR N/A 07/23/2021   Procedure: ANTERIOR (CYSTOCELE) AND POSTERIOR REPAIR (RECTOCELE);  Surgeon: Harlin Heys, MD;  Location: [facility];  Service: Gynecology;  Laterality: N/A;   CARDIAC CATHETERIZATION  2006   carpel tunnel Bilateral    CATARACT EXTRACTION, BILATERAL     CHOLECYSTECTOMY N/A 2006   IVC FILTER INSERTION N/A 12/11/2020   Procedure: IVC FILTER INSERTION;  Surgeon: Algernon Huxley, MD;  Location: [place] [facility];  Service: Cardiovascular;  Laterality: N/A;   IVC FILTER REMOVAL N/A 04/19/2021   Procedure: IVC FILTER  REMOVAL;  Surgeon: Algernon Huxley, MD;  Location: [place] [facility];  Service: Cardiovascular;  Laterality: N/A;   JOINT REPLACEMENT Right 2021   KNEE   LEFT HEART CATH AND CORONARY ANGIOGRAPHY Left 06/14/2019   Procedure: LEFT HEART CATH AND CORONARY ANGIOGRAPHY;  Surgeon: Wellington Hampshire, MD;  Location: [place] [facility];  Service: Cardiovascular;  Laterality: Left;   PUBOVAGINAL SLING N/A  07/23/2021   Procedure: Gaynelle Arabian;  Surgeon: Harlin Heys, MD;  Location: [facility];  Service: Gynecology;  Laterality: N/A;   PULMONARY THROMBECTOMY Bilateral 03/22/2020   Procedure: PULMONARY THROMBECTOMY / THROMBOLYSIS;  Surgeon: Algernon Huxley, MD;  Location: [place] [facility];  Service: Cardiovascular;  Laterality: Bilateral;   REPLACEMENT TOTAL KNEE Left 2022   VAGINAL HYSTERECTOMY N/A 07/23/2021   Procedure: HYSTERECTOMY VAGINAL;  Surgeon: Harlin Heys, MD;  Location: [facility];  Service: Gynecology;  Laterality: N/A;     Current Outpatient Medications  Medication Sig Dispense Refill   albuterol (VENTOLIN HFA) 108 (90 Base) MCG/ACT inhaler Inhale 2 puffs into the lungs every 6 (six) hours as needed for wheezing or shortness of breath. 8 g 2   aspirin EC 81 MG tablet Take 81 mg by mouth in the morning. Swallow whole.     carvedilol (COREG) 6.25 MG tablet Take 1 tablet (6.25 mg total) by mouth 2 (two) times daily. 60 tablet 0   folic acid (FOLVITE) 1 MG tablet Take 1 mg by mouth in the morning.     ibuprofen (ADVIL) 600 MG tablet Take 1 tablet (600 mg total) by mouth every 6 (six) hours. 30 tablet 0   methotrexate (RHEUMATREX) 2.5 MG tablet Take 15 mg by mouth every Sunday.     naproxen sodium (ALEVE) 220 MG tablet Take 220 mg by mouth daily as needed (pain.).     olmesartan-hydrochlorothiazide (BENICAR HCT) 20-12.5 MG tablet TAKE 1 TABLET BY MOUTH ONCE A DAY 90 tablet 1   pantoprazole (PROTONIX) 40 MG tablet Take 1 tablet (40 mg total) by mouth daily. 30 tablet 0   No current facility-administered medications for this visit.    Allergies:   Acetaminophen and Tramadol    Social History:  The patient  reports that she has never smoked. She has never used smokeless tobacco. She reports current alcohol use. She reports that she does not use drugs.   Family History:  The patient's family history includes Asthma in her father and mother; Heart failure in her father;  Leukemia in her sister.    ROS:  Please see the history of present illness.   Otherwise, review of systems are positive for none.   All other systems are reviewed and negative.    PHYSICAL EXAM: VS:  BP 126/78 (BP Location: Left Arm, Patient Position: Sitting, Cuff Size: Normal)   Pulse 61   Ht '5\' 2"'$  (1.575 m)   Wt 201 lb 8 oz (91.4 kg)   SpO2 98%   BMI 36.85 kg/m  , BMI Body mass index is 36.85 kg/m. GEN: Well nourished, well developed, in no acute distress  HEENT: normal  Neck: no JVD, carotid bruits, or masses Cardiac: RRR; no murmurs, rubs, or gallops,no edema .   Respiratory:  clear to auscultation bilaterally, normal work of breathing GI: soft, nontender, nondistended, + BS MS: no deformity or atrophy  Skin: warm and dry, no rash Neuro:  Strength and sensation are intact Psych: euthymic mood, full affect   EKG:  EKG is not ordered  today.    Recent Labs: 08/01/2021: ALT 22; BUN 22; Creatinine, Ser 0.88; Hemoglobin 13.4; Platelets 235; Potassium 3.4; Sodium 137; TSH 3.000    Lipid Panel    Component Value Date/Time   CHOL 172 07/09/2021 1601   TRIG 215.0 (H) 07/09/2021 1601   HDL 59.60 07/09/2021 1601   CHOLHDL 3 07/09/2021 1601   VLDL 43.0 (H) 07/09/2021 1601   LDLDIRECT 85.0 07/09/2021 1601      Wt Readings from Last 3 Encounters:  08/07/21 201 lb 8 oz (91.4 kg)  08/01/21 200 lb (90.7 kg)  07/23/21 200 lb (90.7 kg)      PAD Screen 06/03/2019  Previous PAD dx? No  Previous surgical procedure? No  Pain with walking? No  Feet/toe relief with dangling? No  Painful, non-healing ulcers? No  Extremities discolored? No      ASSESSMENT AND PLAN:  1.  Chest pain: The patient had recent episodes of chest pain in the setting of low blood pressure and palpitations.  She ruled out for myocardial infarction and Lexiscan Myoview was normal.  In addition, previous cardiac catheterization in 2020 showed normal coronary arteries.  No further work-up is needed from a  cardiac standpoint.    2.  Palpitations with intermittent tachycardia: Suspect likely sinus tachycardia.  Her symptoms improved after increasing the dose of carvedilol and she feels better now.  3.  Essential hypertension: Blood pressure is controlled.  Blood pressure is controlled on current medications.  4.  Stress and anxiety: I do think some of her recent presentation was related to stress and anxiety.  She is requesting something to calm her nerves and I encouraged her to discuss with Dr. [name].    Disposition:   FU with me in 6 months.  Signed,  [name], MD  08/07/2021 4:43 PM    [place]

## 2021-08-08 ENCOUNTER — Ambulatory Visit (INDEPENDENT_AMBULATORY_CARE_PROVIDER_SITE_OTHER): Payer: Medicare Other | Admitting: Family Medicine

## 2021-08-08 ENCOUNTER — Encounter: Payer: Self-pay | Admitting: Family Medicine

## 2021-08-08 ENCOUNTER — Other Ambulatory Visit: Payer: Self-pay

## 2021-08-08 VITALS — BP 122/78 | HR 74 | Ht 62.01 in | Wt 201.0 lb

## 2021-08-08 DIAGNOSIS — F419 Anxiety disorder, unspecified: Secondary | ICD-10-CM | POA: Diagnosis not present

## 2021-08-08 DIAGNOSIS — I10 Essential (primary) hypertension: Secondary | ICD-10-CM | POA: Diagnosis not present

## 2021-08-08 DIAGNOSIS — F32A Depression, unspecified: Secondary | ICD-10-CM | POA: Diagnosis not present

## 2021-08-08 DIAGNOSIS — Z23 Encounter for immunization: Secondary | ICD-10-CM | POA: Diagnosis not present

## 2021-08-08 DIAGNOSIS — R002 Palpitations: Secondary | ICD-10-CM | POA: Diagnosis not present

## 2021-08-08 LAB — BASIC METABOLIC PANEL
BUN: 16 mg/dL (ref 6–23)
CO2: 29 mEq/L (ref 19–32)
Calcium: 9.4 mg/dL (ref 8.4–10.5)
Chloride: 102 mEq/L (ref 96–112)
Creatinine, Ser: 0.83 mg/dL (ref 0.40–1.20)
GFR: 72.9 mL/min (ref >60.00)
Glucose, Bld: 106 mg/dL — ABNORMAL HIGH (ref 70–99)
Potassium: 4.1 mEq/L (ref 3.5–5.1)
Sodium: 139 mEq/L (ref 135–145)

## 2021-08-08 MED ORDER — SERTRALINE HCL 50 MG PO TABS: 50.0000 mg | ORAL_TABLET | Freq: Every day | ORAL | 3 refills | Status: DC

## 2021-08-08 NOTE — Assessment & Plan Note (Addendum)
This is an ongoing issue.  Patient has anxiety and depression.  We will start on Zoloft 50 mg once daily.  Advised to monitor for sleeping issues and weight gain with this.  Did discuss potentially getting extra help at her home given her husband's dementia though she does not want to do this at this time.

## 2021-08-08 NOTE — Assessment & Plan Note (Signed)
Much improved at this time.  She will continue carvedilol 6.25 mg twice daily and Benicar 1 tablet daily.  Check BMP.

## 2021-08-08 NOTE — Assessment & Plan Note (Signed)
Seems to have been related to sinus tachycardia based on evaluation in the hospital.  Very well controlled on carvedilol 6.25 mg twice daily.

## 2021-08-08 NOTE — Patient Instructions (Signed)
Nice to see you. Please start on the Zoloft. If you develop any sleeping issues or weight gain with this please let us know. Catie will call you guys this afternoon around [phone].

## 2021-08-08 NOTE — Progress Notes (Signed)
[name], MD Phone: [phone]  Cindy Stein is a 65 y.o. female who presents today for follow-up.  Chest pain/hypertension: Patient was having some episodes of tachycardia and chest pressure.  She ended up being seen in the ED.  She had a negative stress test.  She had a negative CT angiogram chest.  Her carvedilol was increased to 6.25 mg twice daily to help with her elevated pulse.  She noted her pulse was in the 120s and her blood pressure was down into the 80s over 50s.  She notes since discharge her symptoms have improved.  Has had no chest pain or shortness of breath.  No palpitations.  Her blood pressure today was 120/72 her pulse was 79.  She saw cardiology yesterday and they felt as though some of her symptoms were related to her heart rate though also anxiety could be playing a role.  Anxiety/depression: Patient notes some anxiety and depression.  This has been an ongoing issue.  No SI.  She worries about her husband.  He has dementia.  He did walk off and wandered into the woods.  She had called the police to find him.  They have installed a camera at home to keep track of them.  She is not sure that she can put them in a skilled nursing facility.  Depression screen [facility] 2/9 08/08/2021 08/08/2021 06/15/2021 01/26/2021 08/29/2020  Decreased Interest 2 0 0 0 0  Down, Depressed, Hopeless 3 0 0 0 0  PHQ - 2 Score 5 0 0 0 0  Altered sleeping 0 - - - -  Tired, decreased energy 2 - - - -  Change in appetite 0 - - - -  Feeling bad or failure about yourself  2 - - - -  Trouble concentrating 0 - - - -  Moving slowly or fidgety/restless 2 - - - -  Suicidal thoughts 0 - - - -  PHQ-9 Score 11 - - - -  Difficult doing work/chores Somewhat difficult - - - -   GAD 7 : Generalized Anxiety Score 08/08/2021  Nervous, Anxious, on Edge 3  Control/stop worrying 3  Worry too much - different things 3  Trouble relaxing 2  Restless 2  Easily annoyed or irritable 2  Afraid - awful might happen 3   Total GAD 7 Score 18  Anxiety Difficulty Somewhat difficult     Social History   Tobacco Use  Smoking Status Never  Smokeless Tobacco Never    Current Outpatient Medications on File Prior to Visit  Medication Sig Dispense Refill   albuterol (VENTOLIN HFA) 108 (90 Base) MCG/ACT inhaler Inhale 2 puffs into the lungs every 6 (six) hours as needed for wheezing or shortness of breath. 8 g 2   aspirin EC 81 MG tablet Take 81 mg by mouth in the morning. Swallow whole.     carvedilol (COREG) 6.25 MG tablet Take 1 tablet (6.25 mg total) by mouth 2 (two) times daily. 60 tablet 0   folic acid (FOLVITE) 1 MG tablet Take 1 mg by mouth in the morning.     ibuprofen (ADVIL) 600 MG tablet Take 1 tablet (600 mg total) by mouth every 6 (six) hours. 30 tablet 0   methotrexate (RHEUMATREX) 2.5 MG tablet Take 15 mg by mouth every Sunday.     naproxen sodium (ALEVE) 220 MG tablet Take 220 mg by mouth daily as needed (pain.).     olmesartan-hydrochlorothiazide (BENICAR HCT) 20-12.5 MG tablet TAKE 1 TABLET BY MOUTH  ONCE A DAY 90 tablet 1   pantoprazole (PROTONIX) 40 MG tablet Take 1 tablet (40 mg total) by mouth daily. 30 tablet 0   No current facility-administered medications on file prior to visit.     ROS see history of present illness  Objective  Physical Exam Vitals:   08/08/21 0837  BP: 122/78  Pulse: 74  SpO2: 97%    BP Readings from Last 3 Encounters:  08/08/21 122/78  08/07/21 126/78  08/02/21 123/70   Wt Readings from Last 3 Encounters:  08/08/21 201 lb (91.2 kg)  08/07/21 201 lb 8 oz (91.4 kg)  08/01/21 200 lb (90.7 kg)    Physical Exam Constitutional:      General: She is not in acute distress.    Appearance: She is not diaphoretic.  Cardiovascular:     Rate and Rhythm: Normal rate and regular rhythm.     Heart sounds: Normal heart sounds.  Pulmonary:     Effort: Pulmonary effort is normal.     Breath sounds: Normal breath sounds.  Skin:    General: Skin is warm  and dry.  Neurological:     Mental Status: She is alert.     Assessment/Plan: Please see individual problem list.  Problem List Items Addressed This Visit     Hypertension (Chronic)    Much improved at this time.  She will continue carvedilol 6.25 mg twice daily and Benicar 1 tablet daily.  Check BMP.      Relevant Orders   Basic Metabolic Panel (BMET)   Anxiety and depression    This is an ongoing issue.  Patient has anxiety and depression.  We will start on Zoloft 50 mg once daily.  Advised to monitor for sleeping issues and weight gain with this.  Did discuss potentially getting extra help at her home given her husband's dementia though she does not want to do this at this time.      Relevant Medications   sertraline (ZOLOFT) 50 MG tablet   Palpitations    Seems to have been related to sinus tachycardia based on evaluation in the hospital.  Very well controlled on carvedilol 6.25 mg twice daily.        Health Maintenance: Prevnar20 given today.  Return in about 6 weeks (around 09/19/2021) for Anxiety and depression.  This visit occurred during the SARS-CoV-2 public health emergency.  Safety protocols were in place, including screening questions prior to the visit, additional usage of staff PPE, and extensive cleaning of exam room while observing appropriate contact time as indicated for disinfecting solutions.    [name], MD [place]

## 2021-08-10 ENCOUNTER — Other Ambulatory Visit: Payer: Self-pay

## 2021-08-10 ENCOUNTER — Ambulatory Visit (INDEPENDENT_AMBULATORY_CARE_PROVIDER_SITE_OTHER): Payer: Medicare Other | Admitting: Obstetrics and Gynecology

## 2021-08-10 ENCOUNTER — Encounter: Payer: Self-pay | Admitting: Obstetrics and Gynecology

## 2021-08-10 VITALS — BP 117/71 | HR 64 | Ht 62.01 in | Wt 201.0 lb

## 2021-08-10 DIAGNOSIS — Z9889 Other specified postprocedural states: Secondary | ICD-10-CM

## 2021-08-10 NOTE — Progress Notes (Signed)
HPI:      [name] is a 65 y.o. G1P1001 who LMP was No LMP recorded. Patient has had a hysterectomy.  Subjective:   She presents today for postop check.  Immediately after her surgery discharge she complained of some tightness in her chest and her heart racing.  She was admitted to the hospital for a work-up and she says that nothing was found.  Her blood pressure medication was changed and this made her feel better. As far as her surgery, she reports no pain or bleeding.  She is urinating and having bowel movements without issue.  She is eating without problem.  She describes feeling very much more comfortable.    Hx: The following portions of the patient's history were reviewed and updated as appropriate:             She  has a past medical history of Anxiety, Atypical chest pain, DOE (dyspnea on exertion), DVT of lower extremity (deep venous thrombosis) ([place]) (03/20/2020), Grade I diastolic dysfunction ([id number]), History of immunosuppression, History of PSVT (paroxysmal supraventricular tachycardia) (07/22/2019), HLD (hyperlipidemia), Hypertension, Osteoarthritis, Plantar fascial fibromatosis, Pulmonary embolus ([place]) (03/21/2020), PVC (premature ventricular contraction), Rheumatoid arthritis ([place]), Valgus deformity of great toes, bilateral, and Vitamin D deficiency. She does not have any pertinent problems on file. She  has a past surgical history that includes Cholecystectomy (N/A, 2006); carpel tunnel (Bilateral); Cardiac catheterization (2006); LEFT HEART CATH AND CORONARY ANGIOGRAPHY (Left, 06/14/2019); PULMONARY THROMBECTOMY (Bilateral, 03/22/2020); IVC FILTER INSERTION (N/A, 12/11/2020); IVC FILTER REMOVAL (N/A, 04/19/2021); Cataract extraction, bilateral; Joint replacement (Right, 2021); Replacement total knee (Left, 2022); Vaginal hysterectomy (N/A, 07/23/2021); Anterior and posterior repair (N/A, 07/23/2021); and Pubovaginal sling (N/A, 07/23/2021). Her family history includes  Asthma in her father and mother; Heart failure in her father; Leukemia in her sister. She  reports that she has never smoked. She has never used smokeless tobacco. She reports current alcohol use. She reports that she does not use drugs. She has a current medication list which includes the following prescription(s): albuterol, aspirin ec, carvedilol, folic acid, ibuprofen, methotrexate, naproxen sodium, olmesartan-hydrochlorothiazide, pantoprazole, and sertraline. She is allergic to acetaminophen and tramadol.       Review of Systems:  Review of Systems  Constitutional: Denied constitutional symptoms, night sweats, recent illness, fatigue, fever, insomnia and weight loss.  Eyes: Denied eye symptoms, eye pain, photophobia, vision change and visual disturbance.  Ears/Nose/Throat/Neck: Denied ear, nose, throat or neck symptoms, hearing loss, nasal discharge, sinus congestion and sore throat.  Cardiovascular: Denied cardiovascular symptoms, arrhythmia, chest pain/pressure, edema, exercise intolerance, orthopnea and palpitations.  Respiratory: Denied pulmonary symptoms, asthma, pleuritic pain, productive sputum, cough, dyspnea and wheezing.  Gastrointestinal: Denied, gastro-esophageal reflux, melena, nausea and vomiting.  Genitourinary: Denied genitourinary symptoms including symptomatic vaginal discharge, pelvic relaxation issues, and urinary complaints.  Musculoskeletal: Denied musculoskeletal symptoms, stiffness, swelling, muscle weakness and myalgia.  Dermatologic: Denied dermatology symptoms, rash and scar.  Neurologic: Denied neurology symptoms, dizziness, headache, neck pain and syncope.  Psychiatric: Denied psychiatric symptoms, anxiety and depression.  Endocrine: Denied endocrine symptoms including hot flashes and night sweats.   Meds:   Current Outpatient Medications on File Prior to Visit  Medication Sig Dispense Refill   albuterol (VENTOLIN HFA) 108 (90 Base) MCG/ACT inhaler Inhale 2  puffs into the lungs every 6 (six) hours as needed for wheezing or shortness of breath. 8 g 2   aspirin EC 81 MG tablet Take 81 mg by mouth in the morning. Swallow whole.  carvedilol (COREG) 6.25 MG tablet Take 1 tablet (6.25 mg total) by mouth 2 (two) times daily. 60 tablet 0   folic acid (FOLVITE) 1 MG tablet Take 1 mg by mouth in the morning.     ibuprofen (ADVIL) 600 MG tablet Take 1 tablet (600 mg total) by mouth every 6 (six) hours. 30 tablet 0   methotrexate (RHEUMATREX) 2.5 MG tablet Take 15 mg by mouth every Sunday.     naproxen sodium (ALEVE) 220 MG tablet Take 220 mg by mouth daily as needed (pain.).     olmesartan-hydrochlorothiazide (BENICAR HCT) 20-12.5 MG tablet TAKE 1 TABLET BY MOUTH ONCE A DAY 90 tablet 1   pantoprazole (PROTONIX) 40 MG tablet Take 1 tablet (40 mg total) by mouth daily. 30 tablet 0   sertraline (ZOLOFT) 50 MG tablet Take 1 tablet (50 mg total) by mouth daily. 30 tablet 3   No current facility-administered medications on file prior to visit.      Objective:     Vitals:   08/10/21 1210  BP: 117/71  Pulse: 64   Filed Weights   08/10/21 1210  Weight: 201 lb (91.2 kg)              Examination reveals no evidence of prolapse sutures intact.          Assessment:    G1P1001 Patient Active Problem List   Diagnosis Date Noted   Anxiety and depression 08/08/2021   Chest pain 08/01/2021   Post-operative state 07/23/2021   Preoperative evaluation to rule out surgical contraindication 07/09/2021   Prediabetes 07/09/2021   S/P IVC filter 04/03/2021   Proteinuria 01/26/2021   Microscopic hematuria 01/26/2021   Pre-bariatric surgery nutrition evaluation 12/05/2020   History of cholecystectomy 10/24/2020   History of decompression of median nerve 10/24/2020   History of total knee arthroplasty 10/24/2020   Hypertensive disorder 10/24/2020   Osteoarthritis of left knee 10/24/2020   Obesity 08/29/2020   Palpitations 06/27/2020   Pulmonary nodules  03/28/2020   History of pulmonary embolus (PE) 03/21/2020   History of DVT (deep vein thrombosis) 03/21/2020   Hypertension    PVC (premature ventricular contraction)      1. Post-operative state     Patient doing very well postop.   Plan:            1.  I have reinforced that it is important to not do any heavy lifting or overexertion.  Nothing in the vagina.  No straining with bowel movements. Orders No orders of the defined types were placed in this encounter.   No orders of the defined types were placed in this encounter.     F/U  Return in about 4 weeks (around 09/07/2021).  Finis Bud, M.D. 08/10/2021 12:42 PM

## 2021-08-10 NOTE — Progress Notes (Signed)
Pt present for follow up for post op wound check after hysterectomy. Pt stated that she was doing well, healing and has no pain.

## 2021-08-28 ENCOUNTER — Ambulatory Visit (INDEPENDENT_AMBULATORY_CARE_PROVIDER_SITE_OTHER): Payer: Self-pay | Admitting: Dermatology

## 2021-08-28 ENCOUNTER — Ambulatory Visit (INDEPENDENT_AMBULATORY_CARE_PROVIDER_SITE_OTHER): Payer: Medicare Other | Admitting: Pharmacist

## 2021-08-28 ENCOUNTER — Other Ambulatory Visit: Payer: Self-pay

## 2021-08-28 ENCOUNTER — Telehealth: Payer: Self-pay | Admitting: Pharmacist

## 2021-08-28 DIAGNOSIS — L988 Other specified disorders of the skin and subcutaneous tissue: Secondary | ICD-10-CM

## 2021-08-28 DIAGNOSIS — M069 Rheumatoid arthritis, unspecified: Secondary | ICD-10-CM

## 2021-08-28 DIAGNOSIS — I10 Essential (primary) hypertension: Secondary | ICD-10-CM

## 2021-08-28 DIAGNOSIS — R7303 Prediabetes: Secondary | ICD-10-CM

## 2021-08-28 DIAGNOSIS — F32A Depression, unspecified: Secondary | ICD-10-CM

## 2021-08-28 MED ORDER — CARVEDILOL 6.25 MG PO TABS: 6.2500 mg | ORAL_TABLET | Freq: Two times a day (BID) | ORAL | 1 refills | Status: DC

## 2021-08-28 NOTE — Patient Instructions (Signed)
Visit Information  PATIENT GOALS:  Goals Addressed               This Visit's Progress     Patient Stated     Medication Monitoring (pt-stated)        Patient Goals/Self-Care Activities Over the next 90 days, patient will:  - take medications as prescribed check blood pressure periodically, document, and provide at future appointments collaborate with provider on medication access solutions.        Patient verbalizes understanding of instructions provided today and agrees to view in [place].    Plan: Telephone follow up appointment with care management team member scheduled for:  ~ 6 weeks as previously scheduled  Catie Darnelle Maffucci, PharmD, [place], [place] Clinical Pharmacist [employer] [facility] [employer] [phone]

## 2021-08-28 NOTE — Telephone Encounter (Signed)
Noted.  At this point if she has fully recovered even if it was COVID she would no longer have to be on quarantine.  She should wear her mask when she is out in public.  If she has recurrence of symptoms she needs to be evaluated.

## 2021-08-28 NOTE — Telephone Encounter (Addendum)
Contacted patient to discuss her husband's care. She reported that over the past 2 weeks, her family (her, Griselda Miner, her daughter and daughter's husband) have been sick. Daughter Sunday Spillers is the only one who tested positive for COVID, everyone else tested negative via rapid home tests. Patient is unvaccinated against COVID.   Breigh had a fever of 102.7 about a week and a half ago, but has since resolved and is feeling better. Denies any other symptoms.   Routing to PCP for FYI.

## 2021-08-28 NOTE — Chronic Care Management (AMB) (Signed)
Chronic Care Management Pharmacy Note  08/28/2021 Name:  Cindy Stein MRN:  [id number] DOB:  [birth year]  Subjective: Cindy Stein is an 65 y.o. year old female who is a primary patient of [name], [name], MD.  The CCM team was consulted for assistance with disease management and care coordination needs.    Engaged with patient by telephone for  medication management needs  in response to provider referral for pharmacy case management and/or care coordination services.   Consent to Services:  The patient was given information about Chronic Care Management services, agreed to services, and gave verbal consent prior to initiation of services.  Please see initial visit note for detailed documentation.   Patient Care Team: Leone Haven, MD as PCP - General (Family Medicine) Wellington Hampshire, MD as PCP - Cardiology (Cardiology) De Hollingshead, RPH-CPP as Pharmacist (Pharmacist)  Objective:  Lab Results  Component Value Date   CREATININE 0.83 08/08/2021   CREATININE 0.88 08/01/2021   CREATININE 0.86 07/17/2021    Lab Results  Component Value Date   HGBA1C 6.3 07/09/2021   Last diabetic Eye exam: No results found for: HMDIABEYEEXA  Last diabetic Foot exam: No results found for: HMDIABFOOTEX      Component Value Date/Time   CHOL 172 07/09/2021 1601   TRIG 215.0 (H) 07/09/2021 1601   HDL 59.60 07/09/2021 1601   CHOLHDL 3 07/09/2021 1601   VLDL 43.0 (H) 07/09/2021 1601   LDLDIRECT 85.0 07/09/2021 1601    Hepatic Function Latest Ref Rng & Units 08/01/2021 06/27/2020 03/21/2020  Total Protein 6.5 - 8.1 g/dL 7.0 6.5 7.1  Albumin 3.5 - 5.0 g/dL 3.7 3.8 3.9  AST 15 - 41 U/L '21 16 24  ' ALT 0 - 44 U/L '22 23 30  ' Alk Phosphatase 38 - 126 U/L 72 71 78  Total Bilirubin 0.3 - 1.2 mg/dL 0.8 0.5 1.1  Bilirubin, Direct 0.0 - 0.2 mg/dL 0.1 - -    Lab Results  Component Value Date/Time   TSH 3.000 08/01/2021 10:32 AM   TSH 4.50 06/27/2020 08:55 AM   TSH 2.560  06/29/2019 09:43 AM    CBC Latest Ref Rng & Units 08/01/2021 07/17/2021 08/18/2020  WBC 4.0 - 10.5 K/uL 5.9 7.6 8.2  Hemoglobin 12.0 - 15.0 g/dL 13.4 13.8 14.7  Hematocrit 36.0 - 46.0 % 39.2 40.3 42.0  Platelets 150 - 400 K/uL 235 212 224    Social History   Tobacco Use  Smoking Status Never  Smokeless Tobacco Never   BP Readings from Last 3 Encounters:  08/10/21 117/71  08/08/21 122/78  08/07/21 126/78   Pulse Readings from Last 3 Encounters:  08/10/21 64  08/08/21 74  08/07/21 61   Wt Readings from Last 3 Encounters:  08/10/21 201 lb (91.2 kg)  08/08/21 201 lb (91.2 kg)  08/07/21 201 lb 8 oz (91.4 kg)    Assessment: Review of patient past medical history, allergies, medications, health status, including review of consultants reports, laboratory and other test data, was performed as part of comprehensive evaluation and provision of chronic care management services.   SDOH:  (Social Determinants of Health) assessments and interventions performed:  SDOH Interventions    Flowsheet Row Most Recent Value  SDOH Interventions   Financial Strain Interventions Intervention Not Indicated       CCM Care Plan  Allergies  Allergen Reactions   Acetaminophen Other (See Comments)    Tylenol is ineffective   Tramadol Other (See Comments)  Heavily sedated "feels drunk"    Medications Reviewed Today     Reviewed by Harlin Heys, MD (Physician) on 08/10/21 at 1244  Med List Status: <None>   Medication Order Taking? Sig Documenting Provider Last Dose Status Informant  albuterol (VENTOLIN HFA) 108 (90 Base) MCG/ACT inhaler [id number] Yes Inhale 2 puffs into the lungs every 6 (six) hours as needed for wheezing or shortness of breath. Parrett, Fonnie Mu, NP Taking Active Self  aspirin EC 81 MG tablet [id number] Yes Take 81 mg by mouth in the morning. Swallow whole. [provider] Taking Active Self  carvedilol (COREG) 6.25 MG tablet [id number] Yes Take 1 tablet (6.25  mg total) by mouth 2 (two) times daily. Val Riles, MD Taking Active   folic acid (FOLVITE) 1 MG tablet [id number] Yes Take 1 mg by mouth in the morning. [provider] Taking Active Self  ibuprofen (ADVIL) 600 MG tablet [id number] Yes Take 1 tablet (600 mg total) by mouth every 6 (six) hours. Harlin Heys, MD Taking Active Self  methotrexate [facility]) 2.5 MG tablet [id number] Yes Take 15 mg by mouth every Sunday. [provider] Taking Active Self  naproxen sodium (ALEVE) 220 MG tablet [id number] Yes Take 220 mg by mouth daily as needed (pain.). [provider] Taking Active Self  olmesartan-hydrochlorothiazide (BENICAR HCT) 20-12.5 MG tablet [id number] Yes TAKE 1 TABLET BY MOUTH ONCE A DAY Leone Haven, MD Taking Active Self  pantoprazole (PROTONIX) 40 MG tablet [id number] Yes Take 1 tablet (40 mg total) by mouth daily. Val Riles, MD Taking Active   sertraline (ZOLOFT) 50 MG tablet [id number] Yes Take 1 tablet (50 mg total) by mouth daily. Leone Haven, MD Taking Active             Patient Active Problem List   Diagnosis Date Noted   Anxiety and depression 08/08/2021   Chest pain 08/01/2021   Post-operative state 07/23/2021   Preoperative evaluation to rule out surgical contraindication 07/09/2021   Prediabetes 07/09/2021   S/P IVC filter 04/03/2021   Proteinuria 01/26/2021   Microscopic hematuria 01/26/2021   Pre-bariatric surgery nutrition evaluation 12/05/2020   History of cholecystectomy 10/24/2020   History of decompression of median nerve 10/24/2020   History of total knee arthroplasty 10/24/2020   Hypertensive disorder 10/24/2020   Osteoarthritis of left knee 10/24/2020   Obesity 08/29/2020   Palpitations 06/27/2020   Pulmonary nodules 03/28/2020   History of pulmonary embolus (PE) 03/21/2020   History of DVT (deep vein thrombosis) 03/21/2020   Hypertension    PVC (premature ventricular contraction)     Immunization  History  Administered Date(s) Administered   Fluad Quad(high Dose 65+) 09/04/2020   PNEUMOCOCCAL CONJUGATE-20 08/08/2021    Conditions to be addressed/monitored: HTN and Obesity, Anxiety  Care Plan : Medication Management  Updates made by De Hollingshead, RPH-CPP since 08/28/2021 12:00 AM     Problem: Hypertension, Hx DVT and PE, OA, Obesity   Note:        Long-Range Goal: Disease Progression Prevention   This Visit's Progress: On track  Recent Progress: On track  Priority: High  Note:   Current Barriers:  Unable to independently afford treatment regimen Does not adhere to prescribed medication regimen  Pharmacist Clinical Goal(s):  Over the next 90 days, patient will verbalize ability to afford treatment regimen. Over the next 90 days, patient will achieve adherence to monitoring guidelines and medication adherence to achieve therapeutic efficacy. through collaboration with PharmD and provider.  Interventions: 1:1 collaboration with Leone Haven, MD regarding development and update of comprehensive plan of care as evidenced by provider attestation and co-signature Inter-disciplinary care team collaboration (see longitudinal plan of care) Comprehensive medication review performed; medication list updated in electronic medical record  Health Maintenance Yearly influenza vaccination: due - recommended to pursue after resolution of initial infection Td/Tdap vaccination: due - patient previously declined, will discuss moving forward Pneumonia vaccination: up to date COVID vaccinations: due - previously provided education and recommendation, patient decliend Shingrix vaccinations: due - will discuss moving forward Colonoscopy: due - patient previously declined Bone density scan: due - patient previously declined Mammogram: up to date   Hypertension: Controlled per last office visit; current treatment: olmesartan 20/HCTZ 12.5 mg daily, carvedilol 6.25 mg  BID Carvedilol dose increased at last ED visit. Patient requests a script today from our office. Will send prescription in today. Recommended to continue current regimen at this time.  Rheumatoid Arthritis: Improved; current regimen: methotrexate 15 mg once weekly, folic acid 1 mg daily; follows w/ Dr. Jefm Bryant rheumatology Previously recommended to continue current regimen at this time along with collaboration with rheumatology.   Anxiety: Improved per patient report; current regimen: sertraline 50 mg daily Reports she is "less quickly angry" with sertraline. Denies side effects.  Recommended to continue current regimen at this time  ?GERD component to prior chest pain: Pantoprazole 40 mg daily prescribed in ED when patient presented for chest pain. Later cardiology evaluation stated that anxiety may be contributory. Patient reports improvement in anxiety, mood with starting sertraline. Wonders if she needs a refill for pantoprazole. She denies concerns with acid reflux at baseline,  Recommend to complete 30 day supply of pantoprazole and discontinue to evaluate if any benefit of GI symptoms. Discussed preference to ensure therapy is needed rather than continue without a diagnosis. Patient amenable. Advised that if rebound acid reflux symptoms at first to trial OTC tums or famotidine, but if continued symptoms to contact our office for evaluation. Patient verbalizes understanding.   Patient Goals/Self-Care Activities Over the next 90 days, patient will:  - take medications as prescribed check blood pressure periodically, document, and provide at future appointments collaborate with provider on medication access solutions  Follow Up Plan: Telephone follow up appointment with care management team member scheduled for: ~ 4 months        Medication Assistance: None required.  Patient affirms current coverage meets needs.  Patient's preferred pharmacy is:  [place], [address] [place] [address] Phone: [phone] Fax: [phone]   Follow Up:  Patient agrees to Care Plan and Follow-up.  Plan: Telephone follow up appointment with care management team member scheduled for:  ~ 6 weeks as previously scheduled  Catie Darnelle Maffucci, PharmD, [place], [place] Clinical Pharmacist [employer] [facility] [employer] [phone]

## 2021-08-28 NOTE — Progress Notes (Signed)
   Follow-Up Visit   Subjective  Cindy Stein is a 65 y.o. female who presents for the following: Facial Elastosis (Face, pt presents for consult for botox).  Patient accompanied by daughter who contributes to history.  The following portions of the chart were reviewed this encounter and updated as appropriate:   Tobacco  Allergies  Meds  Problems  Med Hx  Surg Hx  Fam Hx     Review of Systems:  No other skin or systemic complaints except as noted in HPI or Assessment and Plan.  Objective  Well appearing patient in no apparent distress; mood and affect are within normal limits.  A focused examination was performed including face. Relevant physical exam findings are noted in the Assessment and Plan.  face Rhytides and volume loss.                     Assessment & Plan  Elastosis of skin face  Recommend Botox to frown complex today and then recheck in 3-4 weeks and add botox to forehead if needed.  Botox 27.5 units injected today to: - Frown Complex 27.5 units 357.50  Filling material injection - face Location: Frown complex  Informed consent: Discussed risks (infection, pain, bleeding, bruising, swelling, allergic reaction, paralysis of nearby muscles, eyelid droop, double vision, neck weakness, difficulty breathing, headache, undesirable cosmetic result, and need for additional treatment) and benefits of the procedure, as well as the alternatives.  Informed consent was obtained.  Preparation: The area was cleansed with alcohol.  Procedure Details:  Botox was injected into the dermis with a 30-gauge needle. Pressure applied to any bleeding. Ice packs offered for swelling.  Lot Number:   [id number] Expiration:  03/2023  Total Units Injected:  27.5  Plan: Patient was instructed to remain upright for 4 hours. Patient was instructed to avoid massaging the face and avoid vigorous exercise for the rest of the day. Tylenol may be used for headache.   Allow 2 weeks before returning to clinic for additional dosing as needed. Patient will call for any problems.   Return in about 3 weeks (around 09/18/2021) for Botox f/u may add to forehead.  I, Othelia Pulling, RMA, am acting as scribe for Sarina Ser, MD . Documentation: I have reviewed the above documentation for accuracy and completeness, and I agree with the above.  Sarina Ser, MD

## 2021-08-28 NOTE — Patient Instructions (Signed)

## 2021-08-30 ENCOUNTER — Encounter: Payer: Self-pay | Admitting: Dermatology

## 2021-08-31 DIAGNOSIS — F419 Anxiety disorder, unspecified: Secondary | ICD-10-CM | POA: Diagnosis not present

## 2021-08-31 DIAGNOSIS — I10 Essential (primary) hypertension: Secondary | ICD-10-CM

## 2021-08-31 DIAGNOSIS — M069 Rheumatoid arthritis, unspecified: Secondary | ICD-10-CM

## 2021-08-31 DIAGNOSIS — F32A Depression, unspecified: Secondary | ICD-10-CM

## 2021-09-11 ENCOUNTER — Ambulatory Visit: Payer: Medicare Other | Admitting: Cardiovascular Disease

## 2021-09-11 ENCOUNTER — Other Ambulatory Visit: Payer: Self-pay

## 2021-09-11 ENCOUNTER — Encounter: Payer: Self-pay | Admitting: Obstetrics and Gynecology

## 2021-09-11 ENCOUNTER — Ambulatory Visit (INDEPENDENT_AMBULATORY_CARE_PROVIDER_SITE_OTHER): Payer: Medicare Other | Admitting: Obstetrics and Gynecology

## 2021-09-11 VITALS — BP 112/72 | HR 70 | Resp 16 | Ht 62.0 in | Wt 200.4 lb

## 2021-09-11 DIAGNOSIS — Z9889 Other specified postprocedural states: Secondary | ICD-10-CM

## 2021-09-11 NOTE — Progress Notes (Signed)
HPI:      [name] is a 66 y.o. G1P1001 who LMP was No LMP recorded. Patient has had a hysterectomy.  Subjective:   She presents today approximately 6 weeks from surgery.  She reports no issues with bowel movements or urination.  She is wondering if she feels something inside her vagina again.    Hx: The following portions of the patient's history were reviewed and updated as appropriate:             She  has a past medical history of Anxiety, Atypical chest pain, DOE (dyspnea on exertion), DVT of lower extremity (deep venous thrombosis) ([place]) (03/20/2020), Grade I diastolic dysfunction (82/95/6213), History of immunosuppression, History of PSVT (paroxysmal supraventricular tachycardia) (07/22/2019), HLD (hyperlipidemia), Hypertension, Osteoarthritis, Plantar fascial fibromatosis, Pulmonary embolus ([place]) (03/21/2020), PVC (premature ventricular contraction), Rheumatoid arthritis ([place]), Valgus deformity of great toes, bilateral, and Vitamin D deficiency. She does not have any pertinent problems on file. She  has a past surgical history that includes Cholecystectomy (N/A, 2006); carpel tunnel (Bilateral); Cardiac catheterization (2006); LEFT HEART CATH AND CORONARY ANGIOGRAPHY (Left, 06/14/2019); PULMONARY THROMBECTOMY (Bilateral, 03/22/2020); IVC FILTER INSERTION (N/A, 12/11/2020); IVC FILTER REMOVAL (N/A, 04/19/2021); Cataract extraction, bilateral; Joint replacement (Right, 2021); Replacement total knee (Left, 2022); Vaginal hysterectomy (N/A, 07/23/2021); Anterior and posterior repair (N/A, 07/23/2021); and Pubovaginal sling (N/A, 07/23/2021). Her family history includes Asthma in her father and mother; Heart failure in her father; Leukemia in her sister. She  reports that she has never smoked. She has never used smokeless tobacco. She reports current alcohol use. She reports that she does not use drugs. She has a current medication list which includes the following prescription(s):  albuterol, aspirin ec, carvedilol, folic acid, ibuprofen, methotrexate, naproxen sodium, olmesartan-hydrochlorothiazide, pantoprazole, and sertraline. She is allergic to acetaminophen and tramadol.       Review of Systems:  Review of Systems  Constitutional: Denied constitutional symptoms, night sweats, recent illness, fatigue, fever, insomnia and weight loss.  Eyes: Denied eye symptoms, eye pain, photophobia, vision change and visual disturbance.  Ears/Nose/Throat/Neck: Denied ear, nose, throat or neck symptoms, hearing loss, nasal discharge, sinus congestion and sore throat.  Cardiovascular: Denied cardiovascular symptoms, arrhythmia, chest pain/pressure, edema, exercise intolerance, orthopnea and palpitations.  Respiratory: Denied pulmonary symptoms, asthma, pleuritic pain, productive sputum, cough, dyspnea and wheezing.  Gastrointestinal: Denied, gastro-esophageal reflux, melena, nausea and vomiting.  Genitourinary: Denied genitourinary symptoms including symptomatic vaginal discharge, pelvic relaxation issues, and urinary complaints.  Musculoskeletal: Denied musculoskeletal symptoms, stiffness, swelling, muscle weakness and myalgia.  Dermatologic: Denied dermatology symptoms, rash and scar.  Neurologic: Denied neurology symptoms, dizziness, headache, neck pain and syncope.  Psychiatric: Denied psychiatric symptoms, anxiety and depression.  Endocrine: Denied endocrine symptoms including hot flashes and night sweats.   Meds:   Current Outpatient Medications on File Prior to Visit  Medication Sig Dispense Refill   albuterol (VENTOLIN HFA) 108 (90 Base) MCG/ACT inhaler Inhale 2 puffs into the lungs every 6 (six) hours as needed for wheezing or shortness of breath. 8 g 2   aspirin EC 81 MG tablet Take 81 mg by mouth in the morning. Swallow whole.     carvedilol (COREG) 6.25 MG tablet Take 1 tablet (6.25 mg total) by mouth 2 (two) times daily. 086 tablet 1   folic acid (FOLVITE) 1 MG tablet  Take 1 mg by mouth in the morning.     ibuprofen (ADVIL) 600 MG tablet Take 1 tablet (600 mg total) by mouth every 6 (six) hours. [place]  tablet 0   methotrexate (RHEUMATREX) 2.5 MG tablet Take 15 mg by mouth every Sunday.     naproxen sodium (ALEVE) 220 MG tablet Take 220 mg by mouth daily as needed (pain.).     olmesartan-hydrochlorothiazide (BENICAR HCT) 20-12.5 MG tablet TAKE 1 TABLET BY MOUTH ONCE A DAY 90 tablet 1   pantoprazole (PROTONIX) 40 MG tablet Take 1 tablet (40 mg total) by mouth daily. 30 tablet 0   sertraline (ZOLOFT) 50 MG tablet Take 1 tablet (50 mg total) by mouth daily. 30 tablet 3   No current facility-administered medications on file prior to visit.      Objective:     Vitals:   09/11/21 1022  BP: 112/72  Pulse: 70  Resp: 16   Filed Weights   09/11/21 1022  Weight: 200 lb 6.4 oz (90.9 kg)              Physical examination   Pelvic:   Vulva: Normal appearance.  No lesions.  Vagina: No lesions or abnormalities noted.  Support: Good support-small amount of bladder descensus but not present on speculum exam  Urethra No masses tenderness or scarring.  Meatus Normal size without lesions or prolapse.  Cervix: Surgically absent  Anus: Normal exam.  No lesions.  Perineum: Normal exam.  No lesions.        Bimanual   Uterus: Surgically absent  Adnexae: No masses.  Non-tender to palpation.  Cul-de-sac: Negative for abnormality.             Assessment:    G1P1001 Patient Active Problem List   Diagnosis Date Noted   Anxiety and depression 08/08/2021   Chest pain 08/01/2021   Post-operative state 07/23/2021   Preoperative evaluation to rule out surgical contraindication 07/09/2021   Prediabetes 07/09/2021   S/P IVC filter 04/03/2021   Proteinuria 01/26/2021   Microscopic hematuria 01/26/2021   Pre-bariatric surgery nutrition evaluation 12/05/2020   History of cholecystectomy 10/24/2020   History of decompression of median nerve 10/24/2020   History of  total knee arthroplasty 10/24/2020   Hypertensive disorder 10/24/2020   Osteoarthritis of left knee 10/24/2020   Obesity 08/29/2020   Palpitations 06/27/2020   Pulmonary nodules 03/28/2020   History of pulmonary embolus (PE) 03/21/2020   History of DVT (deep vein thrombosis) 03/21/2020   Hypertension    PVC (premature ventricular contraction)      1. Post-operative state     Patient doing well without symptoms.    Plan:            1.  Patient may slowly resume normal activities.  2.  Follow-up in 3 months. Orders No orders of the defined types were placed in this encounter.   No orders of the defined types were placed in this encounter.     F/U  Return in about 3 months (around 12/12/2021).  Finis Bud, M.D. 09/11/2021 10:45 AM

## 2021-09-18 ENCOUNTER — Ambulatory Visit (INDEPENDENT_AMBULATORY_CARE_PROVIDER_SITE_OTHER): Payer: Self-pay | Admitting: Dermatology

## 2021-09-18 ENCOUNTER — Other Ambulatory Visit: Payer: Self-pay

## 2021-09-18 DIAGNOSIS — L988 Other specified disorders of the skin and subcutaneous tissue: Secondary | ICD-10-CM

## 2021-09-18 NOTE — Patient Instructions (Signed)

## 2021-09-18 NOTE — Progress Notes (Signed)
   Follow-Up Visit   Subjective  Cindy Stein is a 65 y.o. female who presents for the following: Follow-up (Patient here today for 3 week botox follow up. ).  The following portions of the chart were reviewed this encounter and updated as appropriate:   Tobacco  Allergies  Meds  Problems  Med Hx  Surg Hx  Fam Hx     Review of Systems:   No other skin or systemic complaints except as noted in HPI or  Assessment and Plan.  Objective  Well appearing patient in no apparent distress; mood and affect are within normal limits.  A focused examination was performed including face. Relevant physical exam findings are noted in the Assessment and Plan.  face Rhytides and volume loss.             Assessment & Plan  Elastosis of skin face  Patient pleased with previous Botox. Discussed adding more botox to frown complex, forehead and treating crow's feet today.  Photos post botox treatment at frown complex  Botox 27.5 units injected today to: - Frown complex 5 units - Forehead 1.25 units x 2 - Crow's feet 10 units x 2    Filling material injection - face Location: Frown complex, forehead, bil crow's feet  Informed consent: Discussed risks (infection, pain, bleeding, bruising, swelling, allergic reaction, paralysis of nearby muscles, eyelid droop, double vision, neck weakness, difficulty breathing, headache, undesirable cosmetic result, and need for additional treatment) and benefits of the procedure, as well as the alternatives.  Informed consent was obtained.  Preparation: The area was cleansed with alcohol.  Procedure Details:  Botox was injected into the dermis with a 30-gauge needle. Pressure applied to any bleeding. Ice packs offered for swelling.  Lot Number:  [id number] Expiration:  06/2023  Total Units Injected:  27.5  Plan: Tylenol may be used for headache.  Allow 2 weeks before returning to clinic for additional dosing as needed. Patient will call for  any problems.   Return in about 1 month (around 10/19/2021) for botox f/u and fillers.  I, Othelia Pulling, RMA, am acting as scribe for Sarina Ser, MD . Documentation: I have reviewed the above documentation for accuracy and completeness, and I agree with the above.  Sarina Ser, MD

## 2021-09-19 ENCOUNTER — Encounter: Payer: Self-pay | Admitting: Dermatology

## 2021-09-20 ENCOUNTER — Telehealth: Payer: Self-pay

## 2021-09-20 NOTE — Chronic Care Management (AMB) (Signed)
  Care Management   Note  09/20/2021 Name: Keylani Perlstein MRN: [id number] DOB: Jul 18, 1956  Debbi Strandberg is a 65 y.o. year old female who is a primary care patient of Leone Haven, MD and is actively engaged with the care management team. I reached out to [facility] by phone today to assist with re-scheduling a follow up visit with the Pharmacist  Follow up plan: Unsuccessful telephone outreach attempt made. A HIPAA compliant phone message was left for the patient providing contact information and requesting a return call.  The care management team will reach out to the patient again over the next 7 days.  If patient returns call to provider office, please advise to call [facility]  at [place], [address], [place], [place] [address] Direct Dial: [phone] [email or web address]

## 2021-09-25 ENCOUNTER — Ambulatory Visit (INDEPENDENT_AMBULATORY_CARE_PROVIDER_SITE_OTHER): Payer: Medicare Other | Admitting: Family Medicine

## 2021-09-25 ENCOUNTER — Encounter: Payer: Self-pay | Admitting: Family Medicine

## 2021-09-25 ENCOUNTER — Other Ambulatory Visit: Payer: Self-pay

## 2021-09-25 VITALS — BP 118/70 | HR 70 | Temp 98.6°F | Ht 62.0 in | Wt 200.0 lb

## 2021-09-25 DIAGNOSIS — Z1231 Encounter for screening mammogram for malignant neoplasm of breast: Secondary | ICD-10-CM | POA: Diagnosis not present

## 2021-09-25 DIAGNOSIS — F32A Depression, unspecified: Secondary | ICD-10-CM

## 2021-09-25 DIAGNOSIS — F419 Anxiety disorder, unspecified: Secondary | ICD-10-CM

## 2021-09-25 MED ORDER — SERTRALINE HCL 50 MG PO TABS: 50.0000 mg | ORAL_TABLET | Freq: Every day | ORAL | 1 refills | Status: DC

## 2021-09-25 NOTE — Patient Instructions (Signed)
Nice to see you. Please continue the Zoloft. Please call [phone] to schedule your mammogram.

## 2021-09-25 NOTE — Assessment & Plan Note (Addendum)
Significantly improved.  She will continue Zoloft 50 mg once daily.  Discussed that we will continue this for 6 to 9 months and then could consider tapering off.  She will monitor for worsening symptoms.

## 2021-09-25 NOTE — Progress Notes (Signed)
[name], MD Phone: [phone]  Cindy Stein is a 65 y.o. female who presents today for f/u.  Anxiety/depression: Significantly improved.  She notes no anxiety or depression.  The Zoloft has been quite helpful.  Should [place] headache to start with.  No SI.  GAD 7 : Generalized Anxiety Score 09/25/2021 08/08/2021  Nervous, Anxious, on Edge 0 3  Control/stop worrying 1 3  Worry too much - different things 1 3  Trouble relaxing 0 2  Restless 0 2  Easily annoyed or irritable 0 2  Afraid - awful might happen 0 3  Total GAD 7 Score 2 18  Anxiety Difficulty Not difficult at all Somewhat difficult   Depression screen [facility] 2/9 09/25/2021 09/11/2021 08/08/2021 08/08/2021 06/15/2021  Decreased Interest 0 0 2 0 0  Down, Depressed, Hopeless 0 - 3 0 0  PHQ - 2 Score 0 0 5 0 0  Altered sleeping 0 - 0 - -  Tired, decreased energy 0 - 2 - -  Change in appetite 0 - 0 - -  Feeling bad or failure about yourself  0 - 2 - -  Trouble concentrating 0 - 0 - -  Moving slowly or fidgety/restless 0 - 2 - -  Suicidal thoughts 0 - 0 - -  PHQ-9 Score 0 - 11 - -  Difficult doing work/chores Not difficult at all - Somewhat difficult - -     Social History   Tobacco Use  Smoking Status Never  Smokeless Tobacco Never    Current Outpatient Medications on File Prior to Visit  Medication Sig Dispense Refill   albuterol (VENTOLIN HFA) 108 (90 Base) MCG/ACT inhaler Inhale 2 puffs into the lungs every 6 (six) hours as needed for wheezing or shortness of breath. 8 g 2   aspirin EC 81 MG tablet Take 81 mg by mouth in the morning. Swallow whole.     carvedilol (COREG) 6.25 MG tablet Take 1 tablet (6.25 mg total) by mouth 2 (two) times daily. 829 tablet 1   folic acid (FOLVITE) 1 MG tablet Take 1 mg by mouth in the morning.     ibuprofen (ADVIL) 600 MG tablet Take 1 tablet (600 mg total) by mouth every 6 (six) hours. 30 tablet 0   methotrexate (RHEUMATREX) 2.5 MG tablet Take 15 mg by mouth every Sunday.      naproxen sodium (ALEVE) 220 MG tablet Take 220 mg by mouth daily as needed (pain.).     olmesartan-hydrochlorothiazide (BENICAR HCT) 20-12.5 MG tablet TAKE 1 TABLET BY MOUTH ONCE A DAY 90 tablet 1   pantoprazole (PROTONIX) 40 MG tablet Take 1 tablet (40 mg total) by mouth daily. 30 tablet 0   No current facility-administered medications on file prior to visit.     ROS see history of present illness  Objective  Physical Exam Vitals:   09/25/21 1202  BP: 118/70  Pulse: 70  Temp: 98.6 F (37 C)  SpO2: 98%    BP Readings from Last 3 Encounters:  09/25/21 118/70  09/11/21 112/72  08/10/21 117/71   Wt Readings from Last 3 Encounters:  09/25/21 200 lb (90.7 kg)  09/11/21 200 lb 6.4 oz (90.9 kg)  08/10/21 201 lb (91.2 kg)    Physical Exam Constitutional:      General: She is not in acute distress. Neurological:     Mental Status: She is alert.     Assessment/Plan: Please see individual problem list.  Problem List Items Addressed This Visit  Anxiety and depression - Primary    Significantly improved.  She will continue Zoloft 50 mg once daily.  Discussed that we will continue this for 6 to 9 months and then could consider tapering off.  She will monitor for worsening symptoms.      Relevant Medications   sertraline (ZOLOFT) 50 MG tablet   Other Visit Diagnoses     Encounter for screening mammogram for malignant neoplasm of breast       Relevant Orders   MM 3D SCREEN BREAST BILATERAL       Return in about 4 months (around 01/26/2022) for anxiety/depression.  This visit occurred during the SARS-CoV-2 public health emergency.  Safety protocols were in place, including screening questions prior to the visit, additional usage of staff PPE, and extensive cleaning of exam room while observing appropriate contact time as indicated for disinfecting solutions.    [name], MD [place]

## 2021-10-01 ENCOUNTER — Telehealth: Payer: Medicare Other

## 2021-10-02 DIAGNOSIS — M0579 Rheumatoid arthritis with rheumatoid factor of multiple sites without organ or systems involvement: Secondary | ICD-10-CM | POA: Diagnosis not present

## 2021-10-02 DIAGNOSIS — Z79899 Other long term (current) drug therapy: Secondary | ICD-10-CM | POA: Diagnosis not present

## 2021-10-02 DIAGNOSIS — M159 Polyosteoarthritis, unspecified: Secondary | ICD-10-CM | POA: Diagnosis not present

## 2021-10-03 NOTE — Chronic Care Management (AMB) (Signed)
  Care Management   Note  10/03/2021 Name: Cindy Stein MRN: [id number] DOB: [birth year]  Cindy Stein is a 65 y.o. year old female who is a primary care patient of Leone Haven, MD and is actively engaged with the care management team. I reached out to [facility] by phone today to assist with re-scheduling a follow up visit with the Pharmacist  Follow up plan: Telephone appointment with care management team member scheduled for:12/07/2020  Noreene Larsson, [place], [place], [place] [address] Direct Dial: [phone] [email or web address]: [email or web address]

## 2021-10-04 ENCOUNTER — Ambulatory Visit: Payer: Medicare Other | Admitting: Dermatology

## 2021-10-04 ENCOUNTER — Telehealth: Payer: Self-pay | Admitting: Obstetrics and Gynecology

## 2021-10-04 NOTE — Telephone Encounter (Signed)
Pt called - had hysterectomy 8-22 with Dr.Evans, Pt is reporting blood when she wipes when she got to use bathroom. Pt states light pain in lower stomach area. Pt asked to be called back at [phone]. I offered apt to pt she said it was too far out. Please Advise.

## 2021-10-04 NOTE — Telephone Encounter (Signed)
Called patient to see if she would come in for urine drop off. She is convinced that this is not a UTI. I told her that I would speak with you tomorrow to see what you advise. Appointment was offered and she refused because it was to far out. Please advise.

## 2021-10-08 NOTE — Telephone Encounter (Signed)
Patient has an appointment tomorrow with Dr. Amalia Hailey.

## 2021-10-09 ENCOUNTER — Ambulatory Visit (INDEPENDENT_AMBULATORY_CARE_PROVIDER_SITE_OTHER): Payer: Medicare Other | Admitting: Obstetrics and Gynecology

## 2021-10-09 ENCOUNTER — Other Ambulatory Visit: Payer: Self-pay

## 2021-10-09 ENCOUNTER — Encounter: Payer: Self-pay | Admitting: Obstetrics and Gynecology

## 2021-10-09 VITALS — BP 113/84 | HR 67 | Ht 62.0 in | Wt 200.8 lb

## 2021-10-09 DIAGNOSIS — N950 Postmenopausal bleeding: Secondary | ICD-10-CM

## 2021-10-09 NOTE — Progress Notes (Signed)
GYN pt present due to noticing vaginal bleeding after using the bathroom(urinate). Pt stated that bleeding was light for about 3-4 days and now nothing since then.

## 2021-10-09 NOTE — Progress Notes (Signed)
HPI:      [name] is a 65 y.o. G1P1001 who LMP was No LMP recorded. Patient has had a hysterectomy.  Subjective:   She presents today because she noted some spotting when she wiped after urinating.  Her daughter became concerned and said she should make an appointment.  Cindy Stein says that the spotting has resolved.  She reports that she had no pain and is not experiencing any other difficulties.  She is approximately 3 months from vaginal hysterectomy A&P repair.    Hx: The following portions of the patient's history were reviewed and updated as appropriate:             She  has a past medical history of Anxiety, Atypical chest pain, DOE (dyspnea on exertion), DVT of lower extremity (deep venous thrombosis) ([place]) (03/20/2020), Grade I diastolic dysfunction (51/70/0174), History of immunosuppression, History of PSVT (paroxysmal supraventricular tachycardia) (07/22/2019), HLD (hyperlipidemia), Hypertension, Osteoarthritis, Plantar fascial fibromatosis, Pulmonary embolus ([place]) (03/21/2020), PVC (premature ventricular contraction), Rheumatoid arthritis ([place]), Valgus deformity of great toes, bilateral, and Vitamin D deficiency. She does not have any pertinent problems on file. She  has a past surgical history that includes Cholecystectomy (N/A, 2006); carpel tunnel (Bilateral); Cardiac catheterization (2006); LEFT HEART CATH AND CORONARY ANGIOGRAPHY (Left, 06/14/2019); PULMONARY THROMBECTOMY (Bilateral, 03/22/2020); IVC FILTER INSERTION (N/A, 12/11/2020); IVC FILTER REMOVAL (N/A, 04/19/2021); Cataract extraction, bilateral; Joint replacement (Right, 2021); Replacement total knee (Left, 2022); Vaginal hysterectomy (N/A, 07/23/2021); Anterior and posterior repair (N/A, 07/23/2021); and Pubovaginal sling (N/A, 07/23/2021). Her family history includes Asthma in her father and mother; Heart failure in her father; Leukemia in her sister. She  reports that she has never smoked. She has never used smokeless  tobacco. She reports current alcohol use. She reports that she does not use drugs. She has a current medication list which includes the following prescription(s): albuterol, aspirin ec, carvedilol, folic acid, ibuprofen, methotrexate, naproxen sodium, olmesartan-hydrochlorothiazide, sertraline, and pantoprazole. She is allergic to acetaminophen and tramadol.       Review of Systems:  Review of Systems  Constitutional: Denied constitutional symptoms, night sweats, recent illness, fatigue, fever, insomnia and weight loss.  Eyes: Denied eye symptoms, eye pain, photophobia, vision change and visual disturbance.  Ears/Nose/Throat/Neck: Denied ear, nose, throat or neck symptoms, hearing loss, nasal discharge, sinus congestion and sore throat.  Cardiovascular: Denied cardiovascular symptoms, arrhythmia, chest pain/pressure, edema, exercise intolerance, orthopnea and palpitations.  Respiratory: Denied pulmonary symptoms, asthma, pleuritic pain, productive sputum, cough, dyspnea and wheezing.  Gastrointestinal: Denied, gastro-esophageal reflux, melena, nausea and vomiting.  Genitourinary: See HPI for additional information.  Musculoskeletal: Denied musculoskeletal symptoms, stiffness, swelling, muscle weakness and myalgia.  Dermatologic: Denied dermatology symptoms, rash and scar.  Neurologic: Denied neurology symptoms, dizziness, headache, neck pain and syncope.  Psychiatric: Denied psychiatric symptoms, anxiety and depression.  Endocrine: Denied endocrine symptoms including hot flashes and night sweats.   Meds:   Current Outpatient Medications on File Prior to Visit  Medication Sig Dispense Refill   albuterol (VENTOLIN HFA) 108 (90 Base) MCG/ACT inhaler Inhale 2 puffs into the lungs every 6 (six) hours as needed for wheezing or shortness of breath. 8 g 2   aspirin EC 81 MG tablet Take 81 mg by mouth in the morning. Swallow whole.     carvedilol (COREG) 6.25 MG tablet Take 1 tablet (6.25 mg total)  by mouth 2 (two) times daily. 944 tablet 1   folic acid (FOLVITE) 1 MG tablet Take 1 mg by mouth in the morning.  ibuprofen (ADVIL) 600 MG tablet Take 1 tablet (600 mg total) by mouth every 6 (six) hours. 30 tablet 0   methotrexate (RHEUMATREX) 2.5 MG tablet Take 15 mg by mouth every Sunday.     naproxen sodium (ALEVE) 220 MG tablet Take 220 mg by mouth daily as needed (pain.).     olmesartan-hydrochlorothiazide (BENICAR HCT) 20-12.5 MG tablet TAKE 1 TABLET BY MOUTH ONCE A DAY 90 tablet 1   sertraline (ZOLOFT) 50 MG tablet Take 1 tablet (50 mg total) by mouth daily. 90 tablet 1   pantoprazole (PROTONIX) 40 MG tablet Take 1 tablet (40 mg total) by mouth daily. 30 tablet 0   No current facility-administered medications on file prior to visit.      Objective:     Vitals:   10/09/21 1446  BP: 113/84  Pulse: 67   Filed Weights   10/09/21 1446  Weight: 200 lb 12.8 oz (91.1 kg)              After discussion patient declined examination today.          Assessment:    G1P1001 Patient Active Problem List   Diagnosis Date Noted   Anxiety and depression 08/08/2021   Chest pain 08/01/2021   Post-operative state 07/23/2021   Preoperative evaluation to rule out surgical contraindication 07/09/2021   Prediabetes 07/09/2021   S/P IVC filter 04/03/2021   Proteinuria 01/26/2021   Microscopic hematuria 01/26/2021   Pre-bariatric surgery nutrition evaluation 12/05/2020   History of cholecystectomy 10/24/2020   History of decompression of median nerve 10/24/2020   History of total knee arthroplasty 10/24/2020   Hypertensive disorder 10/24/2020   Osteoarthritis of left knee 10/24/2020   Obesity 08/29/2020   Palpitations 06/27/2020   Pulmonary nodules 03/28/2020   History of pulmonary embolus (PE) 03/21/2020   History of DVT (deep vein thrombosis) 03/21/2020   Hypertension    PVC (premature ventricular contraction)      1. Postmenopausal bleeding     This has resolved  spontaneously.   Plan:            1.  Patient will keep her appointment in January.  If she continues to have spotting we will investigate possible causes. Orders No orders of the defined types were placed in this encounter.   No orders of the defined types were placed in this encounter.     F/U  No follow-ups on file. I spent 12 minutes involved in the care of this patient preparing to see the patient by obtaining and reviewing her medical history (including labs, imaging tests and prior procedures), documenting clinical information in the electronic health record (EHR), counseling and coordinating care plans, writing and sending prescriptions, ordering tests or procedures and in direct communicating with the patient and medical staff discussing pertinent items from her history and physical exam.  Finis Bud, M.D. 10/09/2021 3:12 PM

## 2021-10-18 DIAGNOSIS — M79672 Pain in left foot: Secondary | ICD-10-CM | POA: Diagnosis not present

## 2021-10-18 DIAGNOSIS — M2012 Hallux valgus (acquired), left foot: Secondary | ICD-10-CM | POA: Diagnosis not present

## 2021-10-18 DIAGNOSIS — Z86711 Personal history of pulmonary embolism: Secondary | ICD-10-CM | POA: Diagnosis not present

## 2021-10-26 ENCOUNTER — Other Ambulatory Visit: Payer: Self-pay | Admitting: Family Medicine

## 2021-10-30 ENCOUNTER — Other Ambulatory Visit: Payer: Self-pay

## 2021-10-30 ENCOUNTER — Ambulatory Visit (INDEPENDENT_AMBULATORY_CARE_PROVIDER_SITE_OTHER): Payer: Self-pay | Admitting: Dermatology

## 2021-10-30 DIAGNOSIS — L988 Other specified disorders of the skin and subcutaneous tissue: Secondary | ICD-10-CM

## 2021-10-30 NOTE — Patient Instructions (Signed)

## 2021-10-30 NOTE — Progress Notes (Signed)
   Follow-Up Visit   Subjective  Cindy Stein is a 65 y.o. female who presents for the following: Facial Elastosis (Patient is here today for facial fillers and Botox follow up. ).  The following portions of the chart were reviewed this encounter and updated as appropriate:   Tobacco  Allergies  Meds  Problems  Med Hx  Surg Hx  Fam Hx     Review of Systems:  No other skin or systemic complaints except as noted in HPI or Assessment and Plan.  Objective  Well appearing patient in no apparent distress; mood and affect are within normal limits.  A focused examination was performed including the face. Relevant physical exam findings are noted in the Assessment and Plan.  Face Rhytides and volume loss.                               Assessment & Plan  Elastosis of skin Face  Botox working well for patient.   Discussed with patient that it is not safe or advisable to inject the frown complex wrinkling with fillers due to risk of vein occulusion. Advised patient that the deeper wrinkling in her frown complex will improve after continuing Botox Q3-48M for at least a year.   Restylane Defyne 13mL injected into the B/L oral commissures.   Discussed fine filler (Restylane Refyne) for the wrinkling below the eyebrows.   Filling material injection - Face Prior to the procedure, the patient's past medical history, allergies and the rare but potential risks and complications were reviewed with the patient and a signed consent was obtained. Pre and post-treatment care was discussed and instructions provided.  Location: perioral and marionette lines  Filler Type: Restylane defyne  Lot # [id number] Expiration date: 01/01/2023  Procedure: The area was prepped thoroughly with Puracyn. After introducing the needle into the desired treatment area, the syringe plunger was drawn back to ensure there was no flash of blood prior to injecting the filler in order to minimize  risk of intravascular injection and vascular occlusion. After injection of the filler, the treated areas were cleansed and iced to reduce swelling. Post-treatment instructions were reviewed with the patient.       Patient tolerated the procedure well. The patient will call with any problems, questions or concerns prior to their next appointment.   Return in about 2 months (around 12/30/2021) for Botox injections and filler.  Luther Redo, CMA, am acting as scribe for Sarina Ser, MD . Documentation: I have reviewed the above documentation for accuracy and completeness, and I agree with the above.  Sarina Ser, MD

## 2021-11-06 ENCOUNTER — Encounter: Payer: Self-pay | Admitting: Dermatology

## 2021-11-06 ENCOUNTER — Other Ambulatory Visit: Payer: Self-pay

## 2021-11-06 ENCOUNTER — Ambulatory Visit
Admission: RE | Admit: 2021-11-06 | Discharge: 2021-11-06 | Disposition: A | Discharge status: Home / Self Care | Payer: Medicare Other | Source: Ambulatory Visit | Attending: Family Medicine | Admitting: Family Medicine

## 2021-11-06 DIAGNOSIS — Z1231 Encounter for screening mammogram for malignant neoplasm of breast: Secondary | ICD-10-CM | POA: Diagnosis not present

## 2021-11-08 ENCOUNTER — Other Ambulatory Visit: Payer: Self-pay | Admitting: Family Medicine

## 2021-11-08 DIAGNOSIS — R928 Other abnormal and inconclusive findings on diagnostic imaging of breast: Secondary | ICD-10-CM

## 2021-11-08 DIAGNOSIS — N6489 Other specified disorders of breast: Secondary | ICD-10-CM

## 2021-11-20 ENCOUNTER — Ambulatory Visit
Admission: RE | Admit: 2021-11-20 | Discharge: 2021-11-20 | Disposition: A | Discharge status: Home / Self Care | Payer: Medicare Other | Source: Ambulatory Visit | Attending: Family Medicine | Admitting: Family Medicine

## 2021-11-20 ENCOUNTER — Ambulatory Visit (INDEPENDENT_AMBULATORY_CARE_PROVIDER_SITE_OTHER): Payer: Medicare Other | Admitting: Vascular Surgery

## 2021-11-20 ENCOUNTER — Other Ambulatory Visit: Payer: Self-pay

## 2021-11-20 DIAGNOSIS — R928 Other abnormal and inconclusive findings on diagnostic imaging of breast: Secondary | ICD-10-CM | POA: Diagnosis not present

## 2021-11-20 DIAGNOSIS — N6489 Other specified disorders of breast: Secondary | ICD-10-CM | POA: Insufficient documentation

## 2021-11-20 DIAGNOSIS — N6011 Diffuse cystic mastopathy of right breast: Secondary | ICD-10-CM | POA: Diagnosis not present

## 2021-12-07 ENCOUNTER — Telehealth: Payer: Medicare Other

## 2021-12-07 ENCOUNTER — Telehealth: Payer: Self-pay | Admitting: Pharmacist

## 2021-12-07 NOTE — Telephone Encounter (Signed)
°  Chronic Care Management   Note  12/07/2021 Name: Cindy Stein MRN: [id number] DOB: [birth year]   Attempted to contact patient for scheduled appointment for medication management support. Left HIPAA compliant message for patient to return my call at their convenience.    Plan: - If I do not hear back from the patient by end of business today, will collaborate with Care Guide to outreach to schedule follow up with me  Catie Darnelle Maffucci, PharmD, [place], [place] Pharmacist [employer] [facility] [employer] [phone]

## 2021-12-11 NOTE — Telephone Encounter (Signed)
Pt wished to rs

## 2021-12-12 ENCOUNTER — Encounter: Payer: Self-pay | Admitting: Obstetrics and Gynecology

## 2021-12-12 ENCOUNTER — Other Ambulatory Visit: Payer: Self-pay

## 2021-12-12 ENCOUNTER — Ambulatory Visit: Payer: Medicare Other | Admitting: Obstetrics and Gynecology

## 2021-12-12 VITALS — BP 118/98 | HR 90 | Resp 16 | Ht 62.0 in | Wt 205.4 lb

## 2021-12-12 DIAGNOSIS — N950 Postmenopausal bleeding: Secondary | ICD-10-CM

## 2021-12-12 NOTE — Progress Notes (Signed)
HPI:      [name] is a 66 y.o. G1P1001 who LMP was No LMP recorded. Patient has had a hysterectomy.  Subjective:   She presents today approximately 6 months from her surgery.  She reports no further vaginal bleeding since her last visit.  She says that she is doing very well and having no problems at all.  Has normal bowel movements urination etc.  She denies any pelvic pain. She does state that her most recent mammogram required her to get a follow-up ultrasound and now she is scheduled for a 68-month follow-up mammogram.  This is some concern to her.     Hx: The following portions of the patient's history were reviewed and updated as appropriate:             She  has a past medical history of Anxiety, Atypical chest pain, DOE (dyspnea on exertion), DVT of lower extremity (deep venous thrombosis) ([place]) (03/20/2020), Grade I diastolic dysfunction (40/98/1191), History of immunosuppression, History of PSVT (paroxysmal supraventricular tachycardia) (07/22/2019), HLD (hyperlipidemia), Hypertension, Osteoarthritis, Plantar fascial fibromatosis, Pulmonary embolus ([place]) (03/21/2020), PVC (premature ventricular contraction), Rheumatoid arthritis ([place]), Valgus deformity of great toes, bilateral, and Vitamin D deficiency. She does not have any pertinent problems on file. She  has a past surgical history that includes Cholecystectomy (N/A, 2006); carpel tunnel (Bilateral); Cardiac catheterization (2006); LEFT HEART CATH AND CORONARY ANGIOGRAPHY (Left, 06/14/2019); PULMONARY THROMBECTOMY (Bilateral, 03/22/2020); IVC FILTER INSERTION (N/A, 12/11/2020); IVC FILTER REMOVAL (N/A, 04/19/2021); Cataract extraction, bilateral; Joint replacement (Right, 2021); Replacement total knee (Left, 2022); Vaginal hysterectomy (N/A, 07/23/2021); Anterior and posterior repair (N/A, 07/23/2021); and Pubovaginal sling (N/A, 07/23/2021). Her family history includes Asthma in her father and mother; Heart failure in her father;  Leukemia in her sister. She  reports that she has never smoked. She has never used smokeless tobacco. She reports current alcohol use. She reports that she does not use drugs. She has a current medication list which includes the following prescription(s): albuterol, aspirin ec, carvedilol, folic acid, ibuprofen, methotrexate, naproxen sodium, olmesartan-hydrochlorothiazide, pantoprazole, and sertraline. She is allergic to acetaminophen and tramadol.       Review of Systems:  Review of Systems  Constitutional: Denied constitutional symptoms, night sweats, recent illness, fatigue, fever, insomnia and weight loss.  Eyes: Denied eye symptoms, eye pain, photophobia, vision change and visual disturbance.  Ears/Nose/Throat/Neck: Denied ear, nose, throat or neck symptoms, hearing loss, nasal discharge, sinus congestion and sore throat.  Cardiovascular: Denied cardiovascular symptoms, arrhythmia, chest pain/pressure, edema, exercise intolerance, orthopnea and palpitations.  Respiratory: Denied pulmonary symptoms, asthma, pleuritic pain, productive sputum, cough, dyspnea and wheezing.  Gastrointestinal: Denied, gastro-esophageal reflux, melena, nausea and vomiting.  Genitourinary: Denied genitourinary symptoms including symptomatic vaginal discharge, pelvic relaxation issues, and urinary complaints.  Musculoskeletal: Denied musculoskeletal symptoms, stiffness, swelling, muscle weakness and myalgia.  Dermatologic: Denied dermatology symptoms, rash and scar.  Neurologic: Denied neurology symptoms, dizziness, headache, neck pain and syncope.  Psychiatric: Denied psychiatric symptoms, anxiety and depression.  Endocrine: Denied endocrine symptoms including hot flashes and night sweats.   Meds:   Current Outpatient Medications on File Prior to Visit  Medication Sig Dispense Refill   albuterol (VENTOLIN HFA) 108 (90 Base) MCG/ACT inhaler Inhale 2 puffs into the lungs every 6 (six) hours as needed for  wheezing or shortness of breath. 8 g 2   aspirin EC 81 MG tablet Take 81 mg by mouth in the morning. Swallow whole.     carvedilol (COREG) 6.25 MG tablet Take 1  tablet (6.25 mg total) by mouth 2 (two) times daily. 562 tablet 1   folic acid (FOLVITE) 1 MG tablet Take 1 mg by mouth in the morning.     ibuprofen (ADVIL) 600 MG tablet Take 1 tablet (600 mg total) by mouth every 6 (six) hours. 30 tablet 0   methotrexate (RHEUMATREX) 2.5 MG tablet Take 15 mg by mouth every Sunday.     naproxen sodium (ALEVE) 220 MG tablet Take 220 mg by mouth daily as needed (pain.).     olmesartan-hydrochlorothiazide (BENICAR HCT) 20-12.5 MG tablet TAKE 1 TABLET BY MOUTH ONCE A DAY 90 tablet 1   pantoprazole (PROTONIX) 40 MG tablet Take 1 tablet (40 mg total) by mouth daily. 30 tablet 0   sertraline (ZOLOFT) 50 MG tablet Take 1 tablet (50 mg total) by mouth daily. 90 tablet 1   No current facility-administered medications on file prior to visit.      Objective:     Vitals:   12/12/21 0841  BP: (!) 118/98  Pulse: 90  Resp: 16   Filed Weights   12/12/21 0841  Weight: 205 lb 6.4 oz (93.2 kg)                        Assessment:    G1P1001 Patient Active Problem List   Diagnosis Date Noted   Anxiety and depression 08/08/2021   Chest pain 08/01/2021   Post-operative state 07/23/2021   Preoperative evaluation to rule out surgical contraindication 07/09/2021   Prediabetes 07/09/2021   S/P IVC filter 04/03/2021   Proteinuria 01/26/2021   Microscopic hematuria 01/26/2021   Pre-bariatric surgery nutrition evaluation 12/05/2020   History of cholecystectomy 10/24/2020   History of decompression of median nerve 10/24/2020   History of total knee arthroplasty 10/24/2020   Hypertensive disorder 10/24/2020   Osteoarthritis of left knee 10/24/2020   Obesity 08/29/2020   Palpitations 06/27/2020   Pulmonary nodules 03/28/2020   History of pulmonary embolus (PE) 03/21/2020   History of DVT (deep vein  thrombosis) 03/21/2020   Hypertension    PVC (premature ventricular contraction)      1. Postmenopausal bleeding     No further postmenopausal bleeding.  Patient doing very well status post surgery 6 months ago.   Plan:            1.  Patient to resume normal activities with no restrictions.  2.  Follow-up in 6 months for annual examination. Orders No orders of the defined types were placed in this encounter.   No orders of the defined types were placed in this encounter.     F/U  Return in about 6 months (around 06/11/2022) for Annual Physical. I spent 22 minutes involved in the care of this patient preparing to see the patient by obtaining and reviewing her medical history (including labs, imaging tests and prior procedures), documenting clinical information in the electronic health record (EHR), counseling and coordinating care plans, writing and sending prescriptions, ordering tests or procedures and in direct communicating with the patient and medical staff discussing pertinent items from her history and physical exam.  Finis Bud, M.D. 12/12/2021 9:22 AM

## 2021-12-25 ENCOUNTER — Encounter (INDEPENDENT_AMBULATORY_CARE_PROVIDER_SITE_OTHER): Payer: Self-pay | Admitting: Vascular Surgery

## 2021-12-25 ENCOUNTER — Other Ambulatory Visit: Payer: Self-pay

## 2021-12-25 ENCOUNTER — Ambulatory Visit (INDEPENDENT_AMBULATORY_CARE_PROVIDER_SITE_OTHER): Payer: Medicare Other | Admitting: Vascular Surgery

## 2021-12-25 VITALS — BP 108/72 | HR 65 | Resp 16 | Wt 202.8 lb

## 2021-12-25 DIAGNOSIS — I10 Essential (primary) hypertension: Secondary | ICD-10-CM | POA: Diagnosis not present

## 2021-12-25 DIAGNOSIS — Z86718 Personal history of other venous thrombosis and embolism: Secondary | ICD-10-CM

## 2021-12-25 DIAGNOSIS — Z86711 Personal history of pulmonary embolism: Secondary | ICD-10-CM | POA: Diagnosis not present

## 2021-12-25 DIAGNOSIS — Z95828 Presence of other vascular implants and grafts: Secondary | ICD-10-CM

## 2021-12-25 NOTE — Assessment & Plan Note (Signed)
Treated and doing well

## 2021-12-25 NOTE — Progress Notes (Signed)
MRN : [id number]  Cindy Stein is a 66 y.o. ([birth year]) female who presents with chief complaint of  Chief Complaint  Patient presents with   Follow-up    6 month follow up  .  History of Present Illness: Patient returns today in follow up of her previous DVT and PE.  She is doing well.  She was treated with anticoagulation is done fine.  She does have an upcoming bunion surgery. Legs are doing well without swelling or pain at this time.   Current Outpatient Medications  Medication Sig Dispense Refill   albuterol (VENTOLIN HFA) 108 (90 Base) MCG/ACT inhaler Inhale 2 puffs into the lungs every 6 (six) hours as needed for wheezing or shortness of breath. 8 g 2   aspirin EC 81 MG tablet Take 81 mg by mouth in the morning. Swallow whole.     carvedilol (COREG) 6.25 MG tablet Take 1 tablet (6.25 mg total) by mouth 2 (two) times daily. 578 tablet 1   folic acid (FOLVITE) 1 MG tablet Take 1 mg by mouth in the morning.     ibuprofen (ADVIL) 600 MG tablet Take 1 tablet (600 mg total) by mouth every 6 (six) hours. 30 tablet 0   methotrexate (RHEUMATREX) 2.5 MG tablet Take 15 mg by mouth every Sunday.     naproxen sodium (ALEVE) 220 MG tablet Take 220 mg by mouth daily as needed (pain.).     olmesartan-hydrochlorothiazide (BENICAR HCT) 20-12.5 MG tablet TAKE 1 TABLET BY MOUTH ONCE A DAY 90 tablet 1   sertraline (ZOLOFT) 50 MG tablet Take 1 tablet (50 mg total) by mouth daily. 90 tablet 1   pantoprazole (PROTONIX) 40 MG tablet Take 1 tablet (40 mg total) by mouth daily. 30 tablet 0   No current facility-administered medications for this visit.    Past Medical History:  Diagnosis Date   Anxiety    Atypical chest pain    DOE (dyspnea on exertion)    DVT of lower extremity (deep venous thrombosis) ([place]) 03/20/2020   a.) LEFT femoral, popliteal, posterior tibial, and paroneal veins; Tx'd with Rivaroxaban; b.) embolized on 03/21/2020 requiring PA thrombolysis and mechanical thrombectomy on  03/22/2020   Grade I diastolic dysfunction 46/96/2952   History of immunosuppression    long-term MTX for RA diagnosis   History of PSVT (paroxysmal supraventricular tachycardia) 07/22/2019   a.) brief 5 beat run noted on [facility]   HLD (hyperlipidemia)    Hypertension    Osteoarthritis    Plantar fascial fibromatosis    Pulmonary embolus ([place]) 03/21/2020   a.) BILATERAL; required PA thrombolysis and mechanical thrombectomy on 03/22/2020; b.) IVC filter placed prior to knee surgery on 12/12/2020 and removed on 04/19/2021   PVC (premature ventricular contraction)    Rheumatoid arthritis (HCC)    Valgus deformity of great toes, bilateral    Vitamin D deficiency     Past Surgical History:  Procedure Laterality Date   ANTERIOR AND POSTERIOR REPAIR N/A 07/23/2021   Procedure: ANTERIOR (CYSTOCELE) AND POSTERIOR REPAIR (RECTOCELE);  Surgeon: Harlin Heys, MD;  Location: [facility];  Service: Gynecology;  Laterality: N/A;   CARDIAC CATHETERIZATION  2006   carpel tunnel Bilateral    CATARACT EXTRACTION, BILATERAL     CHOLECYSTECTOMY N/A 2006   IVC FILTER INSERTION N/A 12/11/2020   Procedure: IVC FILTER INSERTION;  Surgeon: Algernon Huxley, MD;  Location: [place] [facility];  Service: Cardiovascular;  Laterality: N/A;   IVC FILTER REMOVAL N/A 04/19/2021  Procedure: IVC FILTER REMOVAL;  Surgeon: Algernon Huxley, MD;  Location: [place] [facility];  Service: Cardiovascular;  Laterality: N/A;   JOINT REPLACEMENT Right 2021   KNEE   LEFT HEART CATH AND CORONARY ANGIOGRAPHY Left 06/14/2019   Procedure: LEFT HEART CATH AND CORONARY ANGIOGRAPHY;  Surgeon: Wellington Hampshire, MD;  Location: [place] [facility];  Service: Cardiovascular;  Laterality: Left;   PUBOVAGINAL SLING N/A 07/23/2021   Procedure: Gaynelle Arabian;  Surgeon: Harlin Heys, MD;  Location: [facility];  Service: Gynecology;  Laterality: N/A;   PULMONARY THROMBECTOMY Bilateral 03/22/2020   Procedure: PULMONARY THROMBECTOMY /  THROMBOLYSIS;  Surgeon: Algernon Huxley, MD;  Location: [place] [facility];  Service: Cardiovascular;  Laterality: Bilateral;   REPLACEMENT TOTAL KNEE Left 2022   VAGINAL HYSTERECTOMY N/A 07/23/2021   Procedure: HYSTERECTOMY VAGINAL;  Surgeon: Harlin Heys, MD;  Location: [facility];  Service: Gynecology;  Laterality: N/A;     Social History   Tobacco Use   Smoking status: Never   Smokeless tobacco: Never  Vaping Use   Vaping Use: Never used  Substance Use Topics   Alcohol use: Yes    Comment: OCCAS   Drug use: Never      Family History  Problem Relation Age of Onset   Asthma Mother    Asthma Father    Heart failure Father    Leukemia Sister    Breast cancer Neg Hx      Allergies  Allergen Reactions   Acetaminophen Other (See Comments)    Tylenol is ineffective   Tramadol Other (See Comments)    Heavily sedated "feels drunk"    REVIEW OF SYSTEMS (Negative unless checked)   Constitutional: [] Weight loss  [] Fever  [] Chills Cardiac: [] Chest pain   [] Chest pressure   [] Palpitations   [] Shortness of breath when laying flat   [] Shortness of breath at rest   [] Shortness of breath with exertion. Vascular:  [] Pain in legs with walking   [] Pain in legs at rest   [] Pain in legs when laying flat   [] Claudication   [] Pain in feet when walking  [] Pain in feet at rest  [] Pain in feet when laying flat   [x] History of DVT   [x] Phlebitis   [x] Swelling in legs   [] Varicose veins   [] Non-healing ulcers Pulmonary:   [] Uses home oxygen   [] Productive cough   [] Hemoptysis   [] Wheeze  [] COPD   [] Asthma Neurologic:  [] Dizziness  [] Blackouts   [] Seizures   [] History of stroke   [] History of TIA  [] Aphasia   [] Temporary blindness   [] Dysphagia   [] Weakness or numbness in arms   [] Weakness or numbness in legs Musculoskeletal:  [x] Arthritis   [] Joint swelling   [x] Joint pain   [] Low back pain Hematologic:  [] Easy bruising  [] Easy bleeding   [] Hypercoagulable state   [] Anemic   Gastrointestinal:   [] Blood in stool   [] Vomiting blood  [] Gastroesophageal reflux/heartburn   [] Abdominal pain Genitourinary:  [] Chronic kidney disease   [] Difficult urination  [] Frequent urination  [] Burning with urination   [] Hematuria Skin:  [] Rashes   [] Ulcers   [] Wounds Psychological:  [] History of anxiety   []  History of major depression.   Physical Examination  BP 108/72 (BP Location: Left Arm)    Pulse 65    Resp 16    Wt 202 lb 12.8 oz (92 kg)    BMI 37.09 kg/m  Gen:  WD/WN, NAD Head: [place]/AT, No temporalis wasting. Ear/Nose/Throat: Hearing grossly  intact, nares w/o erythema or drainage Eyes: Conjunctiva clear. Sclera non-icteric Neck: Supple.  Trachea midline Pulmonary:  Good air movement, no use of accessory muscles.  Cardiac: RRR, no JVD Vascular:  Vessel Right Left  Radial Palpable Palpable           Musculoskeletal: M/S 5/5 throughout.  No deformity or atrophy. No edema. Neurologic: Sensation grossly intact in extremities.  Symmetrical.  Speech is fluent.  Psychiatric: Judgment intact, Mood & affect appropriate for pt's clinical situation. Dermatologic: No rashes or ulcers noted.  No cellulitis or open wounds.      Labs No results found for this or any previous visit (from the past 2160 hour(s)).  Radiology No results found.  Assessment/Plan Left leg DVT (HCC) Legs are currently doing well.  We discussed whether or not she would need another filter with her bunion surgery.  This would not be a particularly high risk surgery so I think it would be okay without it if her podiatrist is okay with that, but if she is going to have extreme immobility it may be a reasonable option.   Hypertension blood pressure control important in reducing the progression of atherosclerotic disease. On appropriate oral medications.   S/P IVC filter Filter was removed about 9 months ago and she is doing well.  History of pulmonary embolus (PE) Treated and doing well    [name],  MD  12/25/2021 10:49 AM    This note was created with Dragon medical transcription system.  Any errors from dictation are purely unintentional

## 2021-12-27 IMAGING — US US BREAST*R* LIMITED INC AXILLA
1 series · 8 of 8 positions shown · non-contrast
Comparison: Previous exam(s).

ACR Breast Density Category a: The breast tissue is almost entirely
fatty.

CLINICAL DATA: Recall from screening to evaluate a possible focal
right breast asymmetry.

EXAM:
DIGITAL DIAGNOSTIC UNILATERAL RIGHT MAMMOGRAM WITH TOMOSYNTHESIS AND
CAD; ULTRASOUND RIGHT BREAST LIMITED
TECHNIQUE: Right digital diagnostic mammography and breast tomosynthesis was
performed. The images were evaluated with computer-aided detection.;
Targeted ultrasound examination of the right breast was performed

[Series 1: us breast*right* limited inc axilla · 0.06mm/px · 8 of 8 slices shown]
[im 1/8]
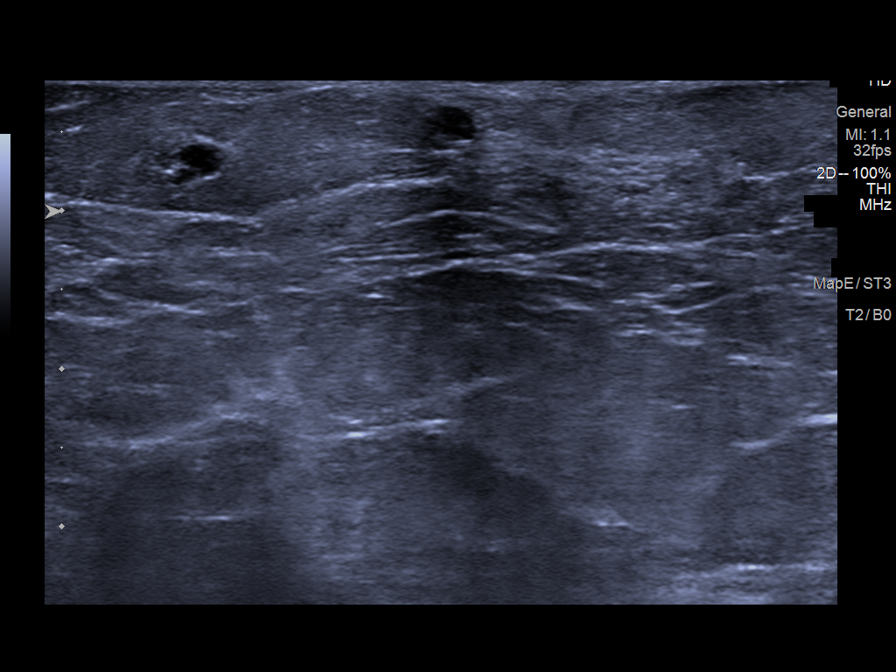
[im 2/8]
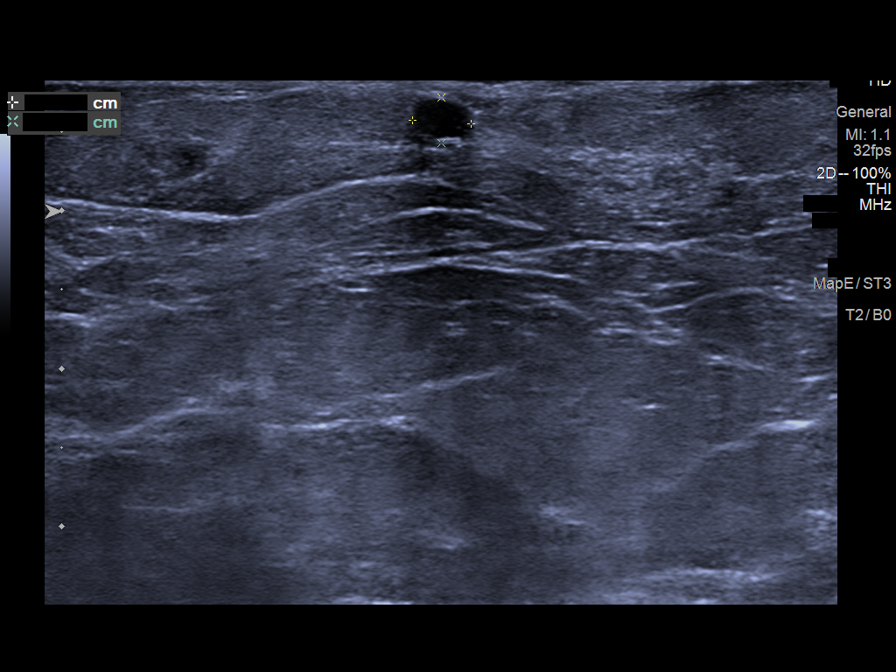
[im 3/8]
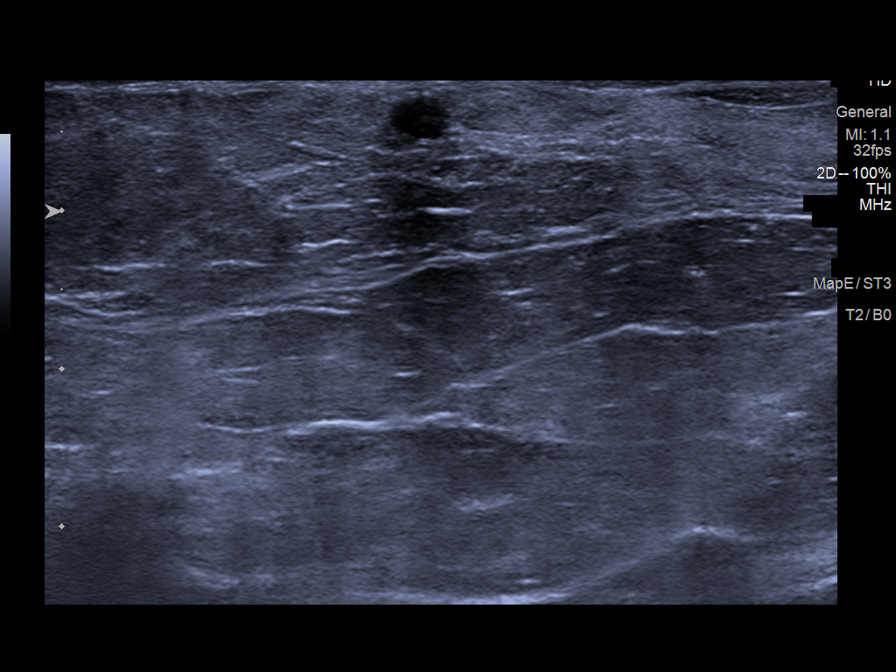
[im 4/8]
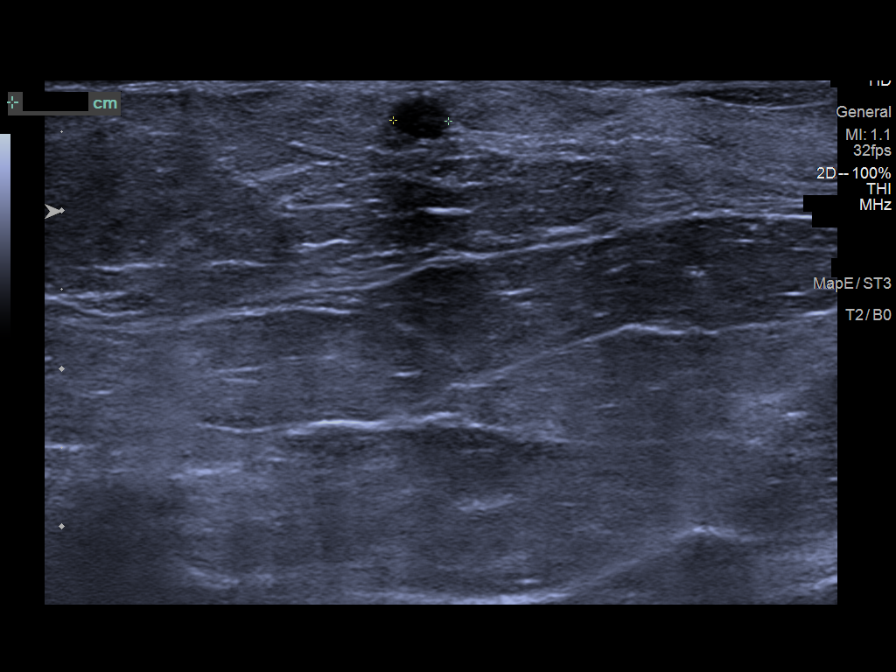
[im 5/8]
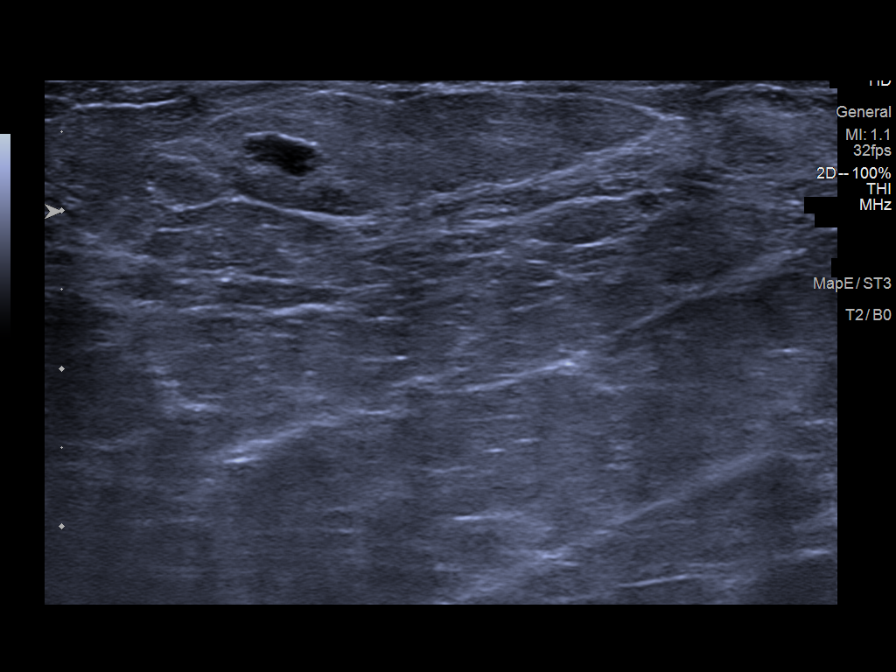
[im 6/8]
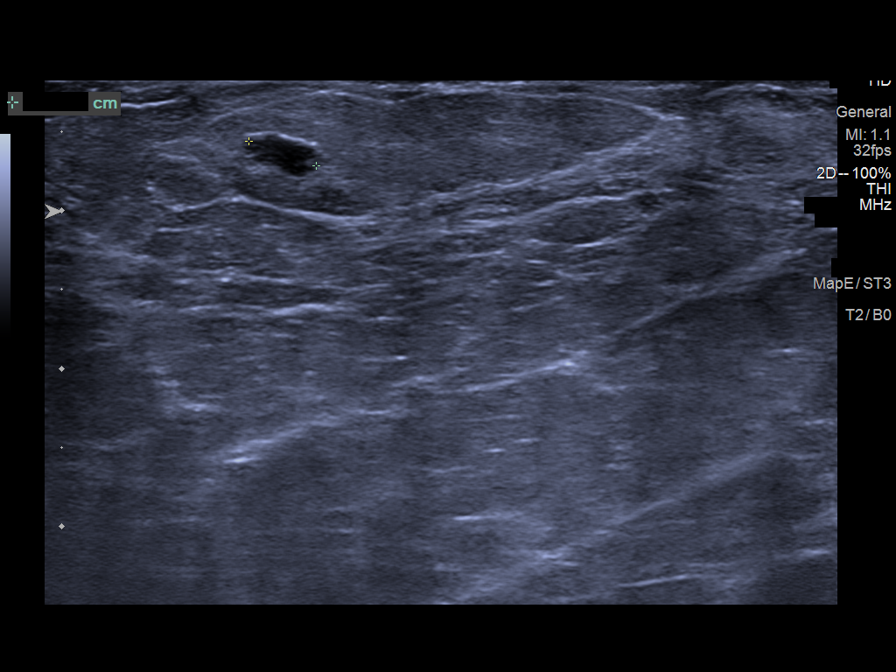
[im 7/8]
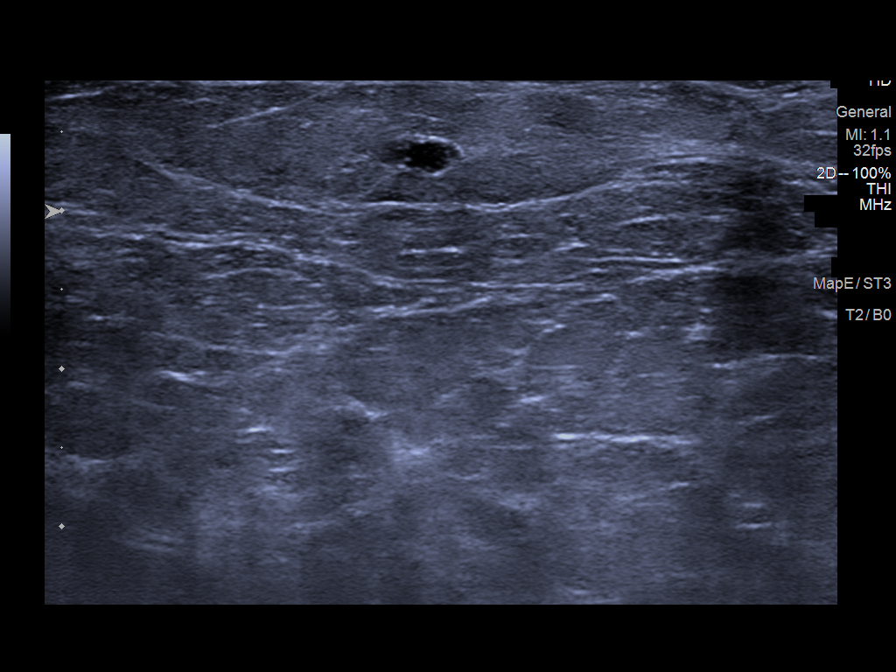
[im 8/8]
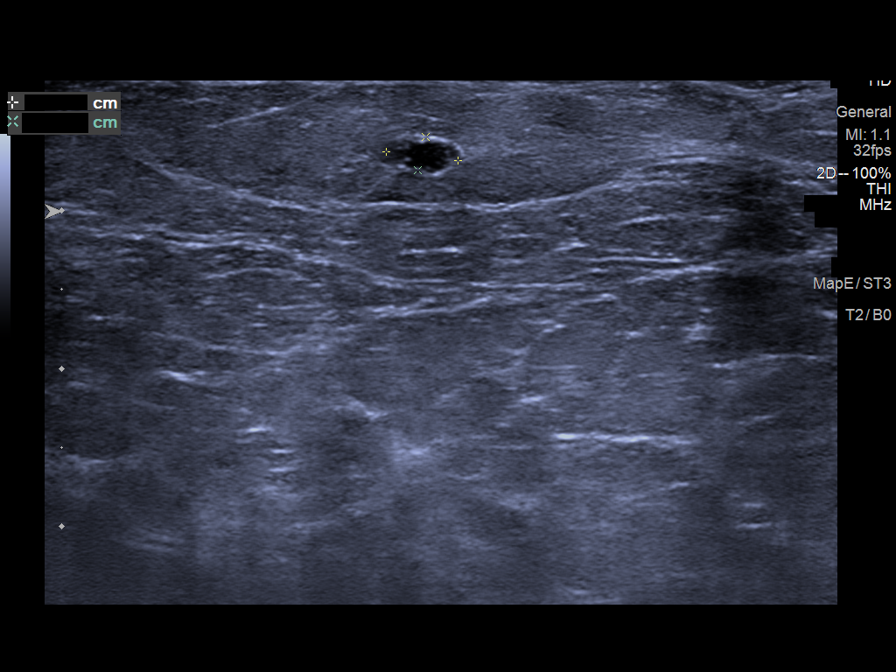

[8 of 8 positions shown; findings below may reference images not displayed]

FINDINGS: Spot compression tomographic images demonstrate an oval 5 mm mass
with mostly circumscribed but partially indistinct borders located
over the middle to posterior third of the upper-outer right breast.

Targeted ultrasound is performed, showing 2 superficial oval
circumscribed minimally complicated cystic masses just below the
skin at the 10 o'clock position of the right breast 6 cm from the
nipple. These measure 3 x 4 x 4 mm and 2 x 5 x 5 mm respectively as
1 of these likely corresponds to the mammographic finding. These
likely represent oil cysts/fat necrosis.
IMPRESSION: Probable benign focal asymmetry over the upper outer right breast
likely correlating to 1 of the 2 superficial complicated cystic
masses at the 10 o'clock position 6 cm from the nipple. Findings
likely due to oil cysts/fat necrosis.

RECOMMENDATION:
Recommend six-month follow-up diagnostic right breast mammogram and
ultrasound to document stability.

I have discussed the findings and recommendations with the patient.
If applicable, a reminder letter will be sent to the patient
regarding the next appointment.

BI-RADS CATEGORY  3: Probably benign.

## 2022-01-09 ENCOUNTER — Ambulatory Visit (INDEPENDENT_AMBULATORY_CARE_PROVIDER_SITE_OTHER): Payer: Medicare Other | Admitting: Family Medicine

## 2022-01-09 ENCOUNTER — Ambulatory Visit: Payer: Self-pay | Admitting: Dermatology

## 2022-01-09 ENCOUNTER — Encounter: Payer: Self-pay | Admitting: Family Medicine

## 2022-01-09 ENCOUNTER — Other Ambulatory Visit: Payer: Self-pay

## 2022-01-09 VITALS — BP 120/80 | HR 77 | Temp 98.7°F | Ht 62.0 in | Wt 206.6 lb

## 2022-01-09 DIAGNOSIS — N6009 Solitary cyst of unspecified breast: Secondary | ICD-10-CM

## 2022-01-09 DIAGNOSIS — M069 Rheumatoid arthritis, unspecified: Secondary | ICD-10-CM | POA: Diagnosis not present

## 2022-01-09 DIAGNOSIS — R3129 Other microscopic hematuria: Secondary | ICD-10-CM

## 2022-01-09 DIAGNOSIS — Z6837 Body mass index (BMI) 37.0-37.9, adult: Secondary | ICD-10-CM

## 2022-01-09 DIAGNOSIS — R7303 Prediabetes: Secondary | ICD-10-CM | POA: Diagnosis not present

## 2022-01-09 DIAGNOSIS — I10 Essential (primary) hypertension: Secondary | ICD-10-CM

## 2022-01-09 LAB — URINALYSIS, ROUTINE W REFLEX MICROSCOPIC
Bilirubin Urine: NEGATIVE
Hgb urine dipstick: NEGATIVE
Ketones, ur: NEGATIVE
Nitrite: NEGATIVE
RBC / HPF: NONE SEEN (ref 0–?)
Specific Gravity, Urine: 1.025 (ref 1.000–1.030)
Total Protein, Urine: NEGATIVE
Urine Glucose: NEGATIVE
Urobilinogen, UA: 0.2 (ref 0.0–1.0)
pH: 5.5 (ref 5.0–8.0)

## 2022-01-09 LAB — HEMOGLOBIN A1C: Hgb A1c MFr Bld: 6.4 % (ref 4.6–6.5)

## 2022-01-09 NOTE — Progress Notes (Signed)
[name], MD Phone: [phone]  Cindy Stein is a 66 y.o. female who presents today for f/u.  HYPERTENSION Disease Monitoring Home BP Monitoring similar to today Chest pain- no    Dyspnea- no Medications Compliance-  taking benicar, HCTZ, coreg.  Edema- no BMET    Component Value Date/Time   NA 139 08/08/2021 0910   NA 143 06/29/2019 0943   K 4.1 08/08/2021 0910   CL 102 08/08/2021 0910   CO2 29 08/08/2021 0910   GLUCOSE 106 (H) 08/08/2021 0910   BUN 16 08/08/2021 0910   BUN 29 (H) 06/29/2019 0943   CREATININE 0.83 08/08/2021 0910   CALCIUM 9.4 08/08/2021 0910   GFRNONAA >60 08/01/2021 1032   GFRAA 38 (L) 04/26/2020 1646   The patient reports no reflux.  She is not on Protonix.  Prediabetes: Patient notes she is active but does not do specific exercise.  Her diet includes lots of fruits and vegetables.  Oftentimes does not eat breakfast.  She will have a sandwich or salad for lunch.  She has a variety of things for dinner.  She has cut down on snacks and sweets.  No soda.  Minimal fried foods.  Rheumatoid arthritis: She sees rheumatology.  They placed her on methotrexate.  She does not feel like this is beneficial.  Social History   Tobacco Use  Smoking Status Never  Smokeless Tobacco Never    Current Outpatient Medications on File Prior to Visit  Medication Sig Dispense Refill   albuterol (VENTOLIN HFA) 108 (90 Base) MCG/ACT inhaler Inhale 2 puffs into the lungs every 6 (six) hours as needed for wheezing or shortness of breath. 8 g 2   aspirin EC 81 MG tablet Take 81 mg by mouth in the morning. Swallow whole.     carvedilol (COREG) 6.25 MG tablet Take 1 tablet (6.25 mg total) by mouth 2 (two) times daily. 102 tablet 1   folic acid (FOLVITE) 1 MG tablet Take 1 mg by mouth in the morning.     ibuprofen (ADVIL) 600 MG tablet Take 1 tablet (600 mg total) by mouth every 6 (six) hours. 30 tablet 0   methotrexate (RHEUMATREX) 2.5 MG tablet Take 15 mg by mouth  every Sunday.     naproxen sodium (ALEVE) 220 MG tablet Take 220 mg by mouth daily as needed (pain.).     olmesartan-hydrochlorothiazide (BENICAR HCT) 20-12.5 MG tablet TAKE 1 TABLET BY MOUTH ONCE A DAY 90 tablet 1   sertraline (ZOLOFT) 50 MG tablet Take 1 tablet (50 mg total) by mouth daily. 90 tablet 1   pantoprazole (PROTONIX) 40 MG tablet Take 1 tablet (40 mg total) by mouth daily. 30 tablet 0   No current facility-administered medications on file prior to visit.     ROS see history of present illness  Objective  Physical Exam Vitals:   01/09/22 0833  BP: 120/80  Pulse: 77  Temp: 98.7 F (37.1 C)  SpO2: 99%    BP Readings from Last 3 Encounters:  01/09/22 120/80  12/25/21 108/72  12/12/21 (!) 118/98   Wt Readings from Last 3 Encounters:  01/09/22 206 lb 9.6 oz (93.7 kg)  12/25/21 202 lb 12.8 oz (92 kg)  12/12/21 205 lb 6.4 oz (93.2 kg)    Physical Exam Constitutional:      General: She is not in acute distress.    Appearance: She is not diaphoretic.  Cardiovascular:     Rate and Rhythm: Normal rate and regular rhythm.  Heart sounds: Normal heart sounds.  Pulmonary:     Effort: Pulmonary effort is normal.     Breath sounds: Normal breath sounds.  Skin:    General: Skin is warm and dry.  Neurological:     Mental Status: She is alert.     Assessment/Plan: Please see individual problem list.  Problem List Items Addressed This Visit     Hypertension - Primary (Chronic)    Well-controlled.  She will continue carvedilol 6.25 mg twice daily, Benicar HCT 1 tablet daily.  Check BMP.      Relevant Orders   Basic Metabolic Panel (BMET)   Breast cyst    The patient has undergone diagnostic mammogram for evaluation of this.  They recommended 85-month follow-up on this.  The patient is aware that they should be contacting her to schedule this at 6 months post mammogram.      Microscopic hematuria    The patient previously had microscopic hematuria.  It  appears she never saw urology.  We will recheck her urine today.  Discussed the need to see urology though she declines this at this time.  I discussed the risk that we could be missing a lesion in her bladder or elsewhere.  She does report a history of kidney stones.      Relevant Orders   Urinalysis, Routine w reflex microscopic   Obesity    I encouraged continued healthy diet.  Discussed adding in walking 3 times a week for 20 to 30 minutes at a time for exercise.  Discussed that we could consider medication for weight loss if she is unable to lose weight with adequate exercise.  Advised that Medicare would be unlikely to cover weight loss medications.      Prediabetes    I encouraged healthy diet and adding in exercise.  Check A1c today.      Relevant Orders   HgB A1c   Urinalysis, Routine w reflex microscopic   Rheumatoid arthritis [facility])    Patient will continue to follow with rheumatology.  I did encourage her to contact them to let them know that the methotrexate has not been beneficial.        Health Maintenance: The patient declines a flu vaccine.  Return in about 6 weeks (around 02/20/2022) for weight follow-up.  This visit occurred during the SARS-CoV-2 public health emergency.  Safety protocols were in place, including screening questions prior to the visit, additional usage of staff PPE, and extensive cleaning of exam room while observing appropriate contact time as indicated for disinfecting solutions.    [name], MD [place]

## 2022-01-09 NOTE — Assessment & Plan Note (Signed)
I encouraged continued healthy diet.  Discussed adding in walking 3 times a week for 20 to 30 minutes at a time for exercise.  Discussed that we could consider medication for weight loss if she is unable to lose weight with adequate exercise.  Advised that Medicare would be unlikely to cover weight loss medications.

## 2022-01-09 NOTE — Assessment & Plan Note (Signed)
Patient will continue to follow with rheumatology.  I did encourage her to contact them to let them know that the methotrexate has not been beneficial.

## 2022-01-09 NOTE — Assessment & Plan Note (Signed)
I encouraged healthy diet and adding in exercise.  Check A1c today.

## 2022-01-09 NOTE — Assessment & Plan Note (Addendum)
The patient previously had microscopic hematuria.  It appears she never saw urology.  We will recheck her urine today.  Discussed the need to see urology though she declines this at this time.  I discussed the risk that we could be missing a lesion in her bladder or elsewhere.  She does report a history of kidney stones.

## 2022-01-09 NOTE — Assessment & Plan Note (Signed)
The patient has undergone diagnostic mammogram for evaluation of this.  They recommended 53-month follow-up on this.  The patient is aware that they should be contacting her to schedule this at 6 months post mammogram.

## 2022-01-09 NOTE — Patient Instructions (Addendum)
Nice to see you. We will get lab work today. Please try adding in walking 3 times a week for 20 to 30 minutes at a time. You need to see urology for the prior blood in your urine.  We will recheck your urine today.

## 2022-01-09 NOTE — Assessment & Plan Note (Signed)
Well-controlled.  She will continue carvedilol 6.25 mg twice daily, Benicar HCT 1 tablet daily.  Check BMP.

## 2022-01-10 LAB — BASIC METABOLIC PANEL
BUN: 19 mg/dL (ref 6–23)
CO2: 33 mEq/L — ABNORMAL HIGH (ref 19–32)
Calcium: 9.2 mg/dL (ref 8.4–10.5)
Chloride: 103 mEq/L (ref 96–112)
Creatinine, Ser: 0.84 mg/dL (ref 0.40–1.20)
GFR: 71.65 mL/min (ref >60.00)
Glucose, Bld: 115 mg/dL — ABNORMAL HIGH (ref 70–99)
Potassium: 4 mEq/L (ref 3.5–5.1)
Sodium: 140 mEq/L (ref 135–145)

## 2022-01-15 ENCOUNTER — Telehealth: Payer: Self-pay | Admitting: Family Medicine

## 2022-01-15 NOTE — Telephone Encounter (Signed)
Patient returned Nina's phone call for labs.

## 2022-01-16 NOTE — Telephone Encounter (Signed)
Labs given to patient by phone. See result note.  Jiali Linney,cma

## 2022-01-29 ENCOUNTER — Other Ambulatory Visit: Payer: Self-pay

## 2022-01-29 ENCOUNTER — Ambulatory Visit (INDEPENDENT_AMBULATORY_CARE_PROVIDER_SITE_OTHER): Payer: Medicare Other | Admitting: Family Medicine

## 2022-01-29 ENCOUNTER — Encounter: Payer: Self-pay | Admitting: Family Medicine

## 2022-01-29 DIAGNOSIS — F32A Depression, unspecified: Secondary | ICD-10-CM

## 2022-01-29 DIAGNOSIS — F419 Anxiety disorder, unspecified: Secondary | ICD-10-CM

## 2022-01-29 DIAGNOSIS — M069 Rheumatoid arthritis, unspecified: Secondary | ICD-10-CM | POA: Diagnosis not present

## 2022-01-29 DIAGNOSIS — Z6837 Body mass index (BMI) 37.0-37.9, adult: Secondary | ICD-10-CM

## 2022-01-29 NOTE — Patient Instructions (Signed)
Nice to see. Please continue with diet and exercise.

## 2022-01-29 NOTE — Assessment & Plan Note (Signed)
The patient has lost 4 pounds over the last 3 weeks.  I encouraged her to continue with her diet changes and activity changes.  We will follow-up in 6 weeks.

## 2022-01-29 NOTE — Progress Notes (Signed)
[name], MD Phone: [phone]  Cindy Stein is a 66 y.o. female who presents today for f/u.  Anxiety/depression: Patient notes she is doing quite well with this.  She notes no anxiety or depression.  She remains on Zoloft 50 mg once daily.  Obesity: Patient has been limiting her pasta.  She avoids potatoes and bread.  She avoids fried foods.  She has been walking some for exercise and remaining active outside.  Social History   Tobacco Use  Smoking Status Never  Smokeless Tobacco Never    Current Outpatient Medications on File Prior to Visit  Medication Sig Dispense Refill   albuterol (VENTOLIN HFA) 108 (90 Base) MCG/ACT inhaler Inhale 2 puffs into the lungs every 6 (six) hours as needed for wheezing or shortness of breath. 8 g 2   aspirin EC 81 MG tablet Take 81 mg by mouth in the morning. Swallow whole.     carvedilol (COREG) 6.25 MG tablet Take 1 tablet (6.25 mg total) by mouth 2 (two) times daily. 413 tablet 1   folic acid (FOLVITE) 1 MG tablet Take 1 mg by mouth in the morning.     ibuprofen (ADVIL) 600 MG tablet Take 1 tablet (600 mg total) by mouth every 6 (six) hours. 30 tablet 0   methotrexate (RHEUMATREX) 2.5 MG tablet Take 15 mg by mouth every Sunday.     naproxen sodium (ALEVE) 220 MG tablet Take 220 mg by mouth daily as needed (pain.).     olmesartan-hydrochlorothiazide (BENICAR HCT) 20-12.5 MG tablet TAKE 1 TABLET BY MOUTH ONCE A DAY 90 tablet 1   sertraline (ZOLOFT) 50 MG tablet Take 1 tablet (50 mg total) by mouth daily. 90 tablet 1   pantoprazole (PROTONIX) 40 MG tablet Take 1 tablet (40 mg total) by mouth daily. 30 tablet 0   No current facility-administered medications on file prior to visit.     ROS see history of present illness  Objective  Physical Exam Vitals:   01/29/22 0830  BP: 130/70  Pulse: 60  Temp: 98.7 F (37.1 C)  SpO2: 98%    BP Readings from Last 3 Encounters:  01/29/22 130/70  01/09/22 120/80  12/25/21 108/72   Wt  Readings from Last 3 Encounters:  01/29/22 202 lb (91.6 kg)  01/09/22 206 lb 9.6 oz (93.7 kg)  12/25/21 202 lb 12.8 oz (92 kg)    Physical Exam Constitutional:      General: She is not in acute distress.    Appearance: She is not diaphoretic.  Cardiovascular:     Rate and Rhythm: Normal rate and regular rhythm.     Heart sounds: Normal heart sounds.  Pulmonary:     Effort: Pulmonary effort is normal.     Breath sounds: Normal breath sounds.  Skin:    General: Skin is warm and dry.  Neurological:     Mental Status: She is alert.     Assessment/Plan: Please see individual problem list.  Problem List Items Addressed This Visit     Anxiety and depression    Very well controlled.  She will continue Zoloft 50 mg once daily.      Obesity    The patient has lost 4 pounds over the last 3 weeks.  I encouraged her to continue with her diet changes and activity changes.  We will follow-up in 6 weeks.      Rheumatoid arthritis ([place])    She will continue to see rheumatology.  I discussed she can take Aleve  by mouth daily as needed for pain with food.       Return in about 6 weeks (around 03/12/2022) for Obesity.  This visit occurred during the SARS-CoV-2 public health emergency.  Safety protocols were in place, including screening questions prior to the visit, additional usage of staff PPE, and extensive cleaning of exam room while observing appropriate contact time as indicated for disinfecting solutions.    [name], MD [place]

## 2022-01-29 NOTE — Assessment & Plan Note (Addendum)
She will continue to see rheumatology.  I discussed she can take Aleve by mouth daily as needed for pain with food.

## 2022-01-29 NOTE — Assessment & Plan Note (Signed)
Very well controlled.  She will continue Zoloft 50 mg once daily.

## 2022-01-30 ENCOUNTER — Encounter: Payer: Self-pay | Admitting: Internal Medicine

## 2022-01-30 ENCOUNTER — Ambulatory Visit (INDEPENDENT_AMBULATORY_CARE_PROVIDER_SITE_OTHER): Payer: Medicare Other | Admitting: Internal Medicine

## 2022-01-30 ENCOUNTER — Other Ambulatory Visit: Payer: Self-pay

## 2022-01-30 ENCOUNTER — Other Ambulatory Visit: Payer: Self-pay | Admitting: Family Medicine

## 2022-01-30 VITALS — BP 126/80 | HR 62 | Temp 98.0°F | Ht 62.0 in | Wt 205.8 lb

## 2022-01-30 DIAGNOSIS — R300 Dysuria: Secondary | ICD-10-CM

## 2022-01-30 DIAGNOSIS — R319 Hematuria, unspecified: Secondary | ICD-10-CM | POA: Diagnosis not present

## 2022-01-30 DIAGNOSIS — R309 Painful micturition, unspecified: Secondary | ICD-10-CM

## 2022-01-30 DIAGNOSIS — Z87442 Personal history of urinary calculi: Secondary | ICD-10-CM

## 2022-01-30 DIAGNOSIS — H16223 Keratoconjunctivitis sicca, not specified as Sjogren's, bilateral: Secondary | ICD-10-CM | POA: Diagnosis not present

## 2022-01-30 DIAGNOSIS — H26493 Other secondary cataract, bilateral: Secondary | ICD-10-CM | POA: Diagnosis not present

## 2022-01-30 DIAGNOSIS — I10 Essential (primary) hypertension: Secondary | ICD-10-CM

## 2022-01-30 MED ORDER — CIPROFLOXACIN HCL 500 MG PO TABS: 500.0000 mg | ORAL_TABLET | Freq: Two times a day (BID) | ORAL | 0 refills | Status: AC

## 2022-01-30 MED ORDER — PHENAZOPYRIDINE HCL 200 MG PO TABS: 200.0000 mg | ORAL_TABLET | Freq: Three times a day (TID) | ORAL | 0 refills | Status: DC | PRN

## 2022-01-30 NOTE — Patient Instructions (Addendum)
Dietary Guidelines to Help Prevent Kidney Stones Kidney stones are deposits of minerals and salts that form inside your kidneys. Your risk of developing kidney stones may be greater depending on your diet, your lifestyle, the medicines you take, and whether you have certain medical conditions. Most people can lower their chances of developing kidney stones by following the instructions below. Your dietitian may give you more specific instructions depending on your overall health and the type of kidney stones you tend to develop. What are tips for following this plan? Reading food labels  Choose foods with "no salt added" or "low-salt" labels. Limit your salt (sodium) intake to less than 1,500 mg a day. Choose foods with calcium for each meal and snack. Try to eat about 300 mg of calcium at each meal. Foods that contain 200-500 mg of calcium a serving include: 8 oz (237 mL) of milk, calcium-fortifiednon-dairy milk, and calcium-fortifiedfruit juice. Calcium-fortified means that calcium has been added to these drinks. 8 oz (237 mL) of kefir, yogurt, and soy yogurt. 4 oz (114 g) of tofu. 1 oz (28 g) of cheese. 1 cup (150 g) of dried figs. 1 cup (91 g) of cooked broccoli. One 3 oz (85 g) can of sardines or mackerel. Most people need 1,000-1,500 mg of calcium a day. Talk to your dietitian about how much calcium is recommended for you. Shopping Buy plenty of fresh fruits and vegetables. Most people do not need to avoid fruits and vegetables, even if these foods contain nutrients that may contribute to kidney stones. When shopping for convenience foods, choose: Whole pieces of fruit. Pre-made salads with dressing on the side. Low-fat fruit and yogurt smoothies. Avoid buying frozen meals or prepared deli foods. These can be high in sodium. Look for foods with live cultures, such as yogurt and kefir. Choose high-fiber grains, such as whole-wheat breads, oat bran, and wheat cereals. Cooking Do not add  salt to food when cooking. Place a salt shaker on the table and allow each person to add his or her own salt to taste. Use vegetable protein, such as beans, textured vegetable protein (TVP), or tofu, instead of meat in pasta, casseroles, and soups. Meal planning Eat less salt, if told by your dietitian. To do this: Avoid eating processed or pre-made food. Avoid eating fast food. Eat less animal protein, including cheese, meat, poultry, or fish, if told by your dietitian. To do this: Limit the number of times you have meat, poultry, fish, or cheese each week. Eat a diet free of meat at least 2 days a week. Eat only one serving each day of meat, poultry, fish, or seafood. When you prepare animal protein, cut pieces into small portion sizes. For most meat and fish, one serving is about the size of the palm of your hand. Eat at least five servings of fresh fruits and vegetables each day. To do this: Keep fruits and vegetables on hand for snacks. Eat one piece of fruit or a handful of berries with breakfast. Have a salad and fruit at lunch. Have two kinds of vegetables at dinner. Limit foods that are high in a substance called oxalate. These include: Spinach (cooked), rhubarb, beets, sweet potatoes, and Swiss chard. Peanuts. Potato chips, french fries, and baked potatoes with skin on. Nuts and nut products. Chocolate. If you regularly take a diuretic medicine, make sure to eat at least 1 or 2 servings of fruits or vegetables that are high in potassium each day. These include: Avocado. Banana. Orange, prune,  carrot, or tomato juice. °Baked potato. °Cabbage. °Beans and split peas. °Lifestyle ° °Drink enough fluid to keep your urine pale yellow. This is the most important thing you can do. Spread your fluid intake throughout the day. °If you drink alcohol: °Limit how much you use to: °0-1 drink a day for women who are not pregnant. °0-2 drinks a day for men. °Be aware of how much alcohol is in your  drink. In the [place] one drink equals one 12 oz bottle of beer (355 mL), one 5 oz glass of wine (148 mL), or one 1½ oz glass of hard liquor (44 mL). °Lose weight if told by your health care provider. Work with your dietitian to find an eating plan and weight loss strategies that work best for you. °General information °Talk to your health care provider and dietitian about taking daily supplements. You may be told the following depending on your health and the cause of your kidney stones: °Not to take supplements with vitamin C. °To take a calcium supplement. °To take a daily probiotic supplement. °To take other supplements such as magnesium, fish oil, or vitamin B6. °Take over-the-counter and prescription medicines only as told by your health care provider. These include supplements. °What foods should I limit? °Limit your intake of the following foods, or eat them as told by your dietitian. °Vegetables °Spinach. Rhubarb. Beets. Canned vegetables. Pickles. Olives. Baked potatoes with skin. °Grains °Wheat bran. Baked goods. Salted crackers. Cereals high in sugar. °Meats and other proteins °Nuts. Nut butters. Large portions of meat, poultry, or fish. Salted, precooked, or cured meats, such as sausages, meat loaves, and hot dogs. °Dairy °Cheese. °Beverages °Regular soft drinks. Regular vegetable juice. °Seasonings and condiments °Seasoning blends with salt. Salad dressings. Soy sauce. Ketchup. Barbecue sauce. °Other foods °Canned soups. Canned pasta sauce. Casseroles. Pizza. Lasagna. Frozen meals. Potato chips. French fries. °The items listed above may not be a complete list of foods and beverages you should limit. Contact a dietitian for more information. °What foods should I avoid? °Talk to your dietitian about specific foods you should avoid based on the type of kidney stones you have and your overall health. °Fruits °Grapefruit. °The item listed above may not be a complete list of foods and beverages you should  avoid. Contact a dietitian for more information. °Summary °Kidney stones are deposits of minerals and salts that form inside your kidneys. °You can lower your risk of kidney stones by making changes to your diet. °The most important thing you can do is drink enough fluid. Drink enough fluid to keep your urine pale yellow. °Talk to your dietitian about how much calcium you should have each day, and eat less salt and animal protein as told by your dietitian. °This information is not intended to replace advice given to you by your health care provider. Make sure you discuss any questions you have with your health care provider. °Document Revised: 11/11/2019 Document Reviewed: 11/11/2019 °Elsevier Patient Education © 2022 [employer] ° °Kidney Stones °Kidney stones are solid, rock-like deposits that form inside of the kidneys. The kidneys are a pair of organs that make urine. A kidney stone may form in a kidney and move into other parts of the urinary tract, including the tubes that connect the kidneys to the bladder (ureters), the bladder, and the tube that carries urine out of the body (urethra). As the stone moves through these areas, it can cause intense pain and block the flow of urine. °Kidney stones are created   when high levels of certain minerals are found in the urine. The stones are usually passed out of the body through urination, but in some cases, medical treatment may be needed to remove them. °What are the causes? °Kidney stones may be caused by: °A condition in which certain glands produce too much parathyroid hormone (primary hyperparathyroidism), which causes too much calcium buildup in the blood. °A buildup of uric acid crystals in the bladder (hyperuricosuria). Uric acid is a chemical that the body produces when you eat certain foods. It usually exits the body in the urine. °Narrowing (stricture) of one or both of the ureters. °A kidney blockage that is present at birth (congenital  obstruction). °Past surgery on the kidney or the ureters, such as gastric bypass surgery. °What increases the risk? °The following factors may make you more likely to develop this condition: °Having had a kidney stone in the past. °Having a family history of kidney stones. °Not drinking enough water. °Eating a diet that is high in protein, salt (sodium), or sugar. °Being overweight or obese. °What are the signs or symptoms? °Symptoms of a kidney stone may include: °Pain in the side of the abdomen, right below the ribs (flank pain). Pain usually spreads (radiates) to the groin. °Needing to urinate frequently or urgently. °Painful urination. °Blood in the urine (hematuria). °Nausea. °Vomiting. °Fever and chills. °How is this diagnosed? °This condition may be diagnosed based on: °Your symptoms and medical history. °A physical exam. °Blood tests. °Urine tests. These may be done before and after the stone passes out of your body through urination. °Imaging tests, such as a CT scan, abdominal X-ray, or ultrasound. °A procedure to examine the inside of the bladder (cystoscopy). °How is this treated? °Treatment for kidney stones depends on the size, location, and makeup of the stones. Kidney stones will often pass out of the body through urination. You may need to: °Increase your fluid intake to help pass the stone. In some cases, you may be given fluids through an IV and may need to be monitored at the hospital. °Take medicine for pain. °Make changes in your diet to help prevent kidney stones from coming back. °Sometimes, medical procedures are needed to remove a kidney stone. This may involve: °A procedure to break up kidney stones using: °A focused beam of light (laser therapy). °Shock waves (extracorporeal shock wave lithotripsy). °Surgery to remove kidney stones. This may be needed if you have severe pain or have stones that block your urinary tract. °Follow these instructions at home: °Medicines °Take over-the-counter  and prescription medicines only as told by your health care provider. °Ask your health care provider if the medicine prescribed to you requires you to avoid driving or using heavy machinery. °Eating and drinking °Drink enough fluid to keep your urine pale yellow. You may be instructed to drink at least 8-10 glasses of water each day. This will help you pass the kidney stone. °If directed, change your diet. This may include: °Limiting how much sodium you eat. °Eating more fruits and vegetables. °Limiting how much animal protein--such as red meat, poultry, fish, and eggs--you eat. °Follow instructions from your health care provider about eating or drinking restrictions. °General instructions °Collect urine samples as told by your health care provider. You may need to collect a urine sample: °24 hours after you pass the stone. °8-12 weeks after passing the kidney stone, and every 6-12 months after that. °Strain your urine every time you urinate, for as long as directed. Use the   strainer that your health care provider recommends. Do not throw out the kidney stone after passing it. Keep the stone so it can be tested by your health care provider. Testing the makeup of your kidney stone may help prevent you from getting kidney stones in the future. Keep all follow-up visits as told by your health care provider. This is important. You may need follow-up X-rays or ultrasounds to make sure that your stone has passed. How is this prevented? To prevent another kidney stone: Drink enough fluid to keep your urine pale yellow. This is the best way to prevent kidney stones. Eat a healthy diet and follow recommendations from your health care provider about foods to avoid. You may be instructed to eat a low-protein diet. Recommendations vary depending on the type of kidney stone that you have. Maintain a healthy weight. Where to find more information [facility]): www.kidney.[employer]  [facility]): www.urologyhealth.org Contact a health care provider if: You have pain that gets worse or does not get better with medicine. Get help right away if: You have a fever or chills. You develop severe pain. You develop new abdominal pain. You faint. You are unable to urinate. Summary Kidney stones are solid, rock-like deposits that form inside of the kidneys. Kidney stones can cause nausea, vomiting, blood in the urine, abdominal pain, and the urge to urinate frequently. Treatment for kidney stones depends on the size, location, and makeup of the stones. Kidney stones will often pass out of the body through urination. Kidney stones can be prevented by drinking enough fluids, eating a healthy diet, and maintaining a healthy weight. This information is not intended to replace advice given to you by your health care provider. Make sure you discuss any questions you have with your health care provider. Document Revised: 04/02/2019 Document Reviewed: 04/06/2019 Elsevier Patient Education  [place].  Hematuria, Adult Hematuria is blood in the urine. Blood may be visible in the urine, or it may be identified with a test. This condition can be caused by infections of the bladder, urethra, kidney, or prostate. Other possible causes include: Kidney stones. Cancer of the urinary tract. Too much calcium in the urine. Conditions that are passed from parent to child (inherited conditions). Exercise that requires a lot of energy. Infections can usually be treated with medicine, and a kidney stone usually will pass through your urine. If neither of these is the cause of your hematuria, more tests may be needed to identify the cause of your symptoms. It is very important to tell your health care provider about any blood in your urine, even if it is painless or the blood stops without treatment. Blood in the urine, when it happens and then stops and then happens again, can be a symptom of a very  serious condition, including cancer. There is no pain in the initial stages of many urinary cancers. Follow these instructions at home: Medicines Take over-the-counter and prescription medicines only as told by your health care provider. If you were prescribed an antibiotic medicine, take it as told by your health care provider. Do not stop taking the antibiotic even if you start to feel better. Eating and drinking Drink enough fluid to keep your urine pale yellow. It is recommended that you drink 3-4 quarts (2.8-3.8 L) a day. If you have been diagnosed with an infection, drinking cranberry juice in addition to large amounts of water is recommended. Avoid caffeine, tea, and carbonated beverages. These tend to  irritate the bladder. Avoid alcohol because it may irritate the prostate (in males). General instructions If you have been diagnosed with a kidney stone, follow your health care provider's instructions about straining your urine to catch the stone. Empty your bladder often. Avoid holding urine for long periods of time. If you are female: After a bowel movement, wipe from front to back and use each piece of toilet paper only once. Empty your bladder before and after sex. Pay attention to any changes in your symptoms. Tell your health care provider about any changes or any new symptoms. It is up to you to get the results of any tests. Ask your health care provider, or the department that is doing the test, when your results will be ready. Keep all follow-up visits. This is important. Contact a health care provider if: You develop back pain. You have a fever or chills. You have nausea or vomiting. Your symptoms do not improve after 3 days. Your symptoms get worse. Get help right away if: You develop severe vomiting and are unable to take medicine without vomiting. You develop severe pain in your back or abdomen even though you are taking medicine. You pass a large amount of blood in your  urine. You pass blood clots in your urine. You feel very weak or like you might faint. You faint. Summary Hematuria is blood in the urine. It has many possible causes. It is very important that you tell your health care provider about any blood in your urine, even if it is painless or the blood stops without treatment. Take over-the-counter and prescription medicines only as told by your health care provider. Drink enough fluid to keep your urine pale yellow. This information is not intended to replace advice given to you by your health care provider. Make sure you discuss any questions you have with your health care provider. Document Revised: 07/19/2020 Document Reviewed: 07/19/2020 Elsevier Patient Education  2022 [employer].

## 2022-01-30 NOTE — Progress Notes (Signed)
Chief Complaint  Patient presents with   Urinary Tract Infection   F/u  Blood in urine yesterday pain with urination never had uti h/o kidney stone in [place] on the right not sure if resolved pain right back was 10/10 and feels like urinating frequently  Tried water increase, cranberry juice  Review of Systems  Genitourinary:  Positive for dysuria, frequency, hematuria and urgency.  Past Medical History:  Diagnosis Date   Anxiety    Atypical chest pain    DOE (dyspnea on exertion)    DVT of lower extremity (deep venous thrombosis) (HCC) 03/20/2020   a.) LEFT femoral, popliteal, posterior tibial, and paroneal veins; Tx'd with Rivaroxaban; b.) embolized on 03/21/2020 requiring PA thrombolysis and mechanical thrombectomy on 03/22/2020   Grade I diastolic dysfunction 03/21/2020   History of immunosuppression    long-term MTX for RA diagnosis   History of PSVT (paroxysmal supraventricular tachycardia) 07/22/2019   a.) brief 5 beat run noted on Zio   HLD (hyperlipidemia)    Hypertension    Kidney stone    in [place] (right)   Osteoarthritis    Plantar fascial fibromatosis    Pulmonary embolus (HCC) 03/21/2020   a.) BILATERAL; required PA thrombolysis and mechanical thrombectomy on 03/22/2020; b.) IVC filter placed prior to knee surgery on 12/12/2020 and removed on 04/19/2021   PVC (premature ventricular contraction)    Rheumatoid arthritis (HCC)    Valgus deformity of great toes, bilateral    Vitamin D deficiency    Past Surgical History:  Procedure Laterality Date   ANTERIOR AND POSTERIOR REPAIR N/A 07/23/2021   Procedure: ANTERIOR (CYSTOCELE) AND POSTERIOR REPAIR (RECTOCELE);  Surgeon: Linzie Collin, MD;  Location: [facility];  Service: Gynecology;  Laterality: N/A;   CARDIAC CATHETERIZATION  2006   carpel tunnel Bilateral    CATARACT EXTRACTION, BILATERAL     CHOLECYSTECTOMY N/A 2006   IVC FILTER INSERTION N/A 12/11/2020   Procedure: IVC FILTER INSERTION;  Surgeon: Annice Needy, MD;  Location: [facility];  Service: Cardiovascular;  Laterality: N/A;   IVC FILTER REMOVAL N/A 04/19/2021   Procedure: IVC FILTER REMOVAL;  Surgeon: Annice Needy, MD;  Location: [facility];  Service: Cardiovascular;  Laterality: N/A;   JOINT REPLACEMENT Right 2021   KNEE   LEFT HEART CATH AND CORONARY ANGIOGRAPHY Left 06/14/2019   Procedure: LEFT HEART CATH AND CORONARY ANGIOGRAPHY;  Surgeon: Iran Ouch, MD;  Location: [facility];  Service: Cardiovascular;  Laterality: Left;   PUBOVAGINAL SLING N/A 07/23/2021   Procedure: Leonides Grills;  Surgeon: Linzie Collin, MD;  Location: [facility];  Service: Gynecology;  Laterality: N/A;   PULMONARY THROMBECTOMY Bilateral 03/22/2020   Procedure: PULMONARY THROMBECTOMY / THROMBOLYSIS;  Surgeon: Annice Needy, MD;  Location: [facility];  Service: Cardiovascular;  Laterality: Bilateral;   REPLACEMENT TOTAL KNEE Left 2022   VAGINAL HYSTERECTOMY N/A 07/23/2021   Procedure: HYSTERECTOMY VAGINAL;  Surgeon: Linzie Collin, MD;  Location: [facility];  Service: Gynecology;  Laterality: N/A;   Family History  Problem Relation Age of Onset   Asthma Mother    Asthma Father    Heart failure Father    Leukemia Sister    Breast cancer Neg Hx    Social History   Socioeconomic History   Marital status: Married    Spouse name: Jeno   Number of children: 1   Years of education: Not on file   Highest education level: Not  on file  Occupational History   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  Tobacco Use   Smoking status: Never   Smokeless tobacco: Never  Vaping Use   Vaping Use: Never used  Substance and Sexual Activity   Alcohol use: Yes    Comment: OCCAS   Drug use: Never   Sexual activity: Not Currently  Other Topics Concern   Not on file  Social History Narrative   Lives at home with spouse   Social Determinants of Health   Financial Resource Strain: Low Risk     Difficulty of Paying Living Expenses: Not hard at all  Food Insecurity: No Food Insecurity   Worried About Programme researcher, broadcasting/film/video in the Last Year: Never true   Barista in the Last Year: Never true  Transportation Needs: No Transportation Needs   Lack of Transportation (Medical): No   Lack of Transportation (Non-Medical): No  Physical Activity: Sufficiently Active   Days of Exercise per Week: 5 days   Minutes of Exercise per Session: 30 min  Stress: No Stress Concern Present   Feeling of Stress : Not at all  Social Connections: Unknown   Frequency of Communication with Friends and Family: More than three times a week   Frequency of Social Gatherings with Friends and Family: More than three times a week   Attends Religious Services: Not on Scientist, clinical (histocompatibility and immunogenetics) or Organizations: Not on file   Attends Banker Meetings: Not on file   Marital Status: Married  Catering manager Violence: Not At Risk   Fear of Current or Ex-Partner: No   Emotionally Abused: No   Physically Abused: No   Sexually Abused: No   Current Meds  Medication Sig   aspirin EC 81 MG tablet Take 81 mg by mouth in the morning. Swallow whole.   carvedilol (COREG) 6.25 MG tablet Take 1 tablet (6.25 mg total) by mouth 2 (two) times daily.   ciprofloxacin (CIPRO) 500 MG tablet Take 1 tablet (500 mg total) by mouth 2 (two) times daily for 5 days. With food   folic acid (FOLVITE) 1 MG tablet Take 1 mg by mouth in the morning.   methotrexate (RHEUMATREX) 2.5 MG tablet Take 15 mg by mouth every Sunday.   naproxen sodium (ALEVE) 220 MG tablet Take 220 mg by mouth daily as needed (pain.).   olmesartan-hydrochlorothiazide (BENICAR HCT) 20-12.5 MG tablet TAKE 1 TABLET BY MOUTH ONCE A DAY   phenazopyridine (PYRIDIUM) 200 MG tablet Take 1 tablet (200 mg total) by mouth 3 (three) times daily as needed for pain (for burning).   sertraline (ZOLOFT) 50 MG tablet Take 1 tablet (50 mg total) by mouth daily.    Allergies  Allergen Reactions   Acetaminophen Other (See Comments)    Tylenol is ineffective   Tramadol Other (See Comments)    Heavily sedated "feels drunk"   Recent Results (from the past 2160 hour(s))  HgB A1c     Status: None   Collection Time: 01/09/22  9:16 AM  Result Value Ref Range   Hgb A1c MFr Bld 6.4 4.6 - 6.5 %    Comment: Glycemic Control Guidelines for People with Diabetes:Non Diabetic:  <6%Goal of Therapy: <7%Additional Action Suggested:  >8%   Basic Metabolic Panel (BMET)     Status: Abnormal   Collection Time: 01/09/22  9:16 AM  Result Value Ref Range   Sodium 140 135 - 145 mEq/L  Potassium 4.0 3.5 - 5.1 mEq/L   Chloride 103 96 - 112 mEq/L   CO2 33 (H) 19 - 32 mEq/L   Glucose, Bld 115 (H) 70 - 99 mg/dL   BUN 19 6 - 23 mg/dL   Creatinine, Ser 2.44 0.40 - 1.20 mg/dL   GFR 01.02 >72.53 mL/min    Comment: Calculated using the CKD-EPI Creatinine Equation (2021)   Calcium 9.2 8.4 - 10.5 mg/dL  Urinalysis, Routine w reflex microscopic     Status: Abnormal   Collection Time: 01/09/22  9:28 AM  Result Value Ref Range   Color, Urine YELLOW Yellow;Lt. Yellow;Straw;Dark Yellow;Amber;Green;Red;Brown   APPearance CLEAR Clear;Turbid;Slightly Cloudy;Cloudy   Specific Gravity, Urine 1.025 1.000 - 1.030   pH 5.5 5.0 - 8.0   Total Protein, Urine NEGATIVE Negative   Urine Glucose NEGATIVE Negative   Ketones, ur NEGATIVE Negative   Bilirubin Urine NEGATIVE Negative   Hgb urine dipstick NEGATIVE Negative   Urobilinogen, UA 0.2 0.0 - 1.0   Leukocytes,Ua SMALL (A) Negative   Nitrite NEGATIVE Negative   WBC, UA 3-6/hpf (A) 0-2/hpf   RBC / HPF none seen 0-2/hpf   Mucus, UA Presence of (A) None   Squamous Epithelial / LPF Rare(0-4/hpf) Rare(0-4/hpf)   Objective  Body mass index is 37.64 kg/m. Wt Readings from Last 3 Encounters:  01/30/22 205 lb 12.8 oz (93.4 kg)  01/29/22 202 lb (91.6 kg)  01/09/22 206 lb 9.6 oz (93.7 kg)   Temp Readings from Last 3 Encounters:   01/30/22 98 F (36.7 C) (Oral)  01/29/22 98.7 F (37.1 C) (Oral)  01/09/22 98.7 F (37.1 C) (Oral)   BP Readings from Last 3 Encounters:  01/30/22 126/80  01/29/22 130/70  01/09/22 120/80   Pulse Readings from Last 3 Encounters:  01/30/22 62  01/29/22 60  01/09/22 77    Physical Exam Vitals and nursing note reviewed.  Constitutional:      Appearance: Normal appearance. She is well-developed and well-groomed.  HENT:     Head: Normocephalic and atraumatic.  Eyes:     Conjunctiva/sclera: Conjunctivae normal.     Pupils: Pupils are equal, round, and reactive to light.  Cardiovascular:     Rate and Rhythm: Normal rate and regular rhythm.     Heart sounds: Normal heart sounds. No murmur heard. Pulmonary:     Effort: Pulmonary effort is normal.     Breath sounds: Normal breath sounds.  Abdominal:     General: Abdomen is flat. Bowel sounds are normal.     Tenderness: There is no abdominal tenderness. There is right CVA tenderness.  Musculoskeletal:        General: No tenderness.  Skin:    General: Skin is warm and dry.  Neurological:     General: No focal deficit present.     Mental Status: She is alert and oriented to person, place, and time. Mental status is at baseline.     Cranial Nerves: Cranial nerves 2-12 are intact.     Motor: Motor function is intact.     Coordination: Coordination is intact.     Gait: Gait is intact.  Psychiatric:        Attention and Perception: Attention and perception normal.        Mood and Affect: Mood and affect normal.        Speech: Speech normal.        Behavior: Behavior normal. Behavior is cooperative.        Thought Content: Thought content  normal.        Cognition and Memory: Cognition and memory normal.        Judgment: Judgment normal.    Assessment  Plan  Hematuria, unspecified type - Plan: Urinalysis, Urine Culture, ciprofloxacin (CIPRO) 500 MG tablet bid phenazopyridine (PYRIDIUM) 200 MG tablet tid prn  Painful  urination - Plan: Urinalysis, Urine Culture, ciprofloxacin (CIPRO) 500 MG tablet, phenazopyridine (PYRIDIUM) 200 MG tablet  Dysuria - Plan: ciprofloxacin (CIPRO) 500 MG tablet, phenazopyridine (PYRIDIUM) 200 MG tablet   Consider ct and urology in the future   Provider: Dr. French Ana McLean-Scocuzza-[facility]

## 2022-01-31 LAB — URINALYSIS
Bilirubin Urine: NEGATIVE
Glucose, UA: NEGATIVE
Ketones, ur: NEGATIVE
Nitrite: NEGATIVE
Specific Gravity, Urine: 1.021 (ref 1.001–1.035)
pH: 5.5 (ref 5.0–8.0)

## 2022-01-31 LAB — URINE CULTURE
MICRO NUMBER:: 13073993
SPECIMEN QUALITY:: ADEQUATE

## 2022-02-04 ENCOUNTER — Telehealth: Payer: Self-pay | Admitting: Family Medicine

## 2022-02-04 NOTE — Telephone Encounter (Signed)
Pt returning call. Pt requesting callback.  °

## 2022-02-05 ENCOUNTER — Ambulatory Visit: Payer: Self-pay | Admitting: Pharmacist

## 2022-02-05 ENCOUNTER — Telehealth: Payer: Self-pay | Admitting: Family Medicine

## 2022-02-05 NOTE — Addendum Note (Signed)
Addended by: Orland Mustard on: 02/05/2022 12:28 PM ? ? Modules accepted: Orders ? ?

## 2022-02-05 NOTE — Telephone Encounter (Signed)
Patient informed of urine results and verbalized understanding.  ? ?

## 2022-02-05 NOTE — Chronic Care Management (AMB) (Signed)
?  Chronic Care Management  ? ?Note ? ?02/05/2022 ?Name: Dia Donate MRN: [id number] DOB: 09-08-1956 ? ? ? ?Closing pharmacy CCM case at this time. Patient has clinic contact information for future questions or concerns.  ? ?Catie Darnelle Maffucci, PharmD, [place], CPP ?Clinical Pharmacist ?Therapist, music at [employer] ?[phone] ? ?

## 2022-02-05 NOTE — Telephone Encounter (Signed)
Lft pt vm to call ofc to sch CT. thanks 

## 2022-02-07 ENCOUNTER — Telehealth: Payer: Medicare Other

## 2022-02-07 DIAGNOSIS — M0579 Rheumatoid arthritis with rheumatoid factor of multiple sites without organ or systems involvement: Secondary | ICD-10-CM | POA: Diagnosis not present

## 2022-02-07 DIAGNOSIS — Z79899 Other long term (current) drug therapy: Secondary | ICD-10-CM | POA: Diagnosis not present

## 2022-02-12 ENCOUNTER — Other Ambulatory Visit: Payer: Self-pay

## 2022-02-12 ENCOUNTER — Ambulatory Visit: Payer: Medicare Other | Admitting: Cardiovascular Disease

## 2022-02-12 ENCOUNTER — Encounter: Payer: Self-pay | Admitting: Cardiovascular Disease

## 2022-02-12 VITALS — BP 110/62 | HR 62 | Ht 62.0 in | Wt 202.1 lb

## 2022-02-12 DIAGNOSIS — I10 Essential (primary) hypertension: Secondary | ICD-10-CM | POA: Diagnosis not present

## 2022-02-12 DIAGNOSIS — R002 Palpitations: Secondary | ICD-10-CM | POA: Diagnosis not present

## 2022-02-12 DIAGNOSIS — R0789 Other chest pain: Secondary | ICD-10-CM | POA: Diagnosis not present

## 2022-02-12 NOTE — Progress Notes (Signed)
?  ?Cardiology Office Note ? ? ?Date:  02/12/2022  ? ?ID:  Cindy Stein, DOB Jun 10, 1956, MRN [id number] ? ?PCP:  Leone Haven, MD  ?Cardiologist:   [name], MD  ? ?Chief Complaint  ?Patient presents with  ? Other  ?  6 month f/u no complaints today. Meds reviewed verbally with pt.  ? ? ?  ?History of Present Illness: ?Cindy Stein is a 66 y.o. female who presents for a follow-up visit regarding chest pain and palpitations.    She has known history of essential hypertension, hyperlipidemia and obesity.  She also has strong family history of coronary artery disease affecting almost every family member.   ? ?Due to recurrent atypical chest pain, she underwent cardiac catheterization in July 2020 which showed normal coronary arteries, normal LV systolic function and mildly elevated left ventricular end-diastolic pressure.    She underwent a 7-day ZIO patch monitor which showed sinus rhythm with no evidence of PVCs.  She had 1 short run of SVT that lasted 5 beats. ? ?She had multiple surgeries done this year including right knee replacement, IVC filter placement with subsequent removal, eye surgery and recently hysterectomy.  She was seen 6 months ago for increased chest pain and shortness of breath and the setting of increased anxiety.  Lexiscan Myoview showed no evidence of ischemia.  Echocardiogram showed normal LV systolic function with no significant valvular abnormalities. ?Her symptoms improved significantly with the addition of sertraline.  She is doing extremely well with no chest pain, shortness of breath or palpitations.  Blood pressure is well controlled. ? ? ?Past Medical History:  ?Diagnosis Date  ? Anxiety   ? Atypical chest pain   ? DOE (dyspnea on exertion)   ? DVT of lower extremity (deep venous thrombosis) ([place]) 03/20/2020  ? a.) LEFT femoral, popliteal, posterior tibial, and paroneal veins; Tx'd with Rivaroxaban; b.) embolized on 03/21/2020 requiring PA thrombolysis and mechanical  thrombectomy on 03/22/2020  ? Grade I diastolic dysfunction 34/19/6222  ? History of immunosuppression   ? long-term MTX for RA diagnosis  ? History of PSVT (paroxysmal supraventricular tachycardia) 07/22/2019  ? a.) brief 5 beat run noted on Zio  ? HLD (hyperlipidemia)   ? Hypertension   ? Kidney stone   ? in [place] (right)  ? Osteoarthritis   ? Plantar fascial fibromatosis   ? Pulmonary embolus ([place]) 03/21/2020  ? a.) BILATERAL; required PA thrombolysis and mechanical thrombectomy on 03/22/2020; b.) IVC filter placed prior to knee surgery on 12/12/2020 and removed on 04/19/2021  ? PVC (premature ventricular contraction)   ? Rheumatoid arthritis ([place])   ? Valgus deformity of great toes, bilateral   ? Vitamin D deficiency   ? ? ?Past Surgical History:  ?Procedure Laterality Date  ? ANTERIOR AND POSTERIOR REPAIR N/A 07/23/2021  ? Procedure: ANTERIOR (CYSTOCELE) AND POSTERIOR REPAIR (RECTOCELE);  Surgeon: Harlin Heys, MD;  Location: [facility];  Service: Gynecology;  Laterality: N/A;  ? CARDIAC CATHETERIZATION  2006  ? carpel tunnel Bilateral   ? CATARACT EXTRACTION, BILATERAL    ? CHOLECYSTECTOMY N/A 2006  ? IVC FILTER INSERTION N/A 12/11/2020  ? Procedure: IVC FILTER INSERTION;  Surgeon: Algernon Huxley, MD;  Location: [place] [facility];  Service: Cardiovascular;  Laterality: N/A;  ? IVC FILTER REMOVAL N/A 04/19/2021  ? Procedure: IVC FILTER REMOVAL;  Surgeon: Algernon Huxley, MD;  Location: [place] [facility];  Service: Cardiovascular;  Laterality: N/A;  ? JOINT REPLACEMENT Right 2021  ?  KNEE  ? LEFT HEART CATH AND CORONARY ANGIOGRAPHY Left 06/14/2019  ? Procedure: LEFT HEART CATH AND CORONARY ANGIOGRAPHY;  Surgeon: Wellington Hampshire, MD;  Location: [place] [facility];  Service: Cardiovascular;  Laterality: Left;  ? PUBOVAGINAL SLING N/A 07/23/2021  ? Procedure: PUBO-VAGINAL SLING;  Surgeon: Harlin Heys, MD;  Location: [facility];  Service: Gynecology;  Laterality: N/A;  ? PULMONARY THROMBECTOMY Bilateral  03/22/2020  ? Procedure: PULMONARY THROMBECTOMY / THROMBOLYSIS;  Surgeon: Algernon Huxley, MD;  Location: [place] [facility];  Service: Cardiovascular;  Laterality: Bilateral;  ? REPLACEMENT TOTAL KNEE Left 2022  ? VAGINAL HYSTERECTOMY N/A 07/23/2021  ? Procedure: HYSTERECTOMY VAGINAL;  Surgeon: Harlin Heys, MD;  Location: [facility];  Service: Gynecology;  Laterality: N/A;  ? ? ? ?Current Outpatient Medications  ?Medication Sig Dispense Refill  ? albuterol (VENTOLIN HFA) 108 (90 Base) MCG/ACT inhaler Inhale 2 puffs into the lungs every 6 (six) hours as needed for wheezing or shortness of breath. 8 g 2  ? aspirin EC 81 MG tablet Take 81 mg by mouth in the morning. Swallow whole.    ? carvedilol (COREG) 6.25 MG tablet TAKE 1 TABLET BY MOUTH TWICE A DAY 180 tablet 1  ? folic acid (FOLVITE) 1 MG tablet Take 1 mg by mouth in the morning.    ? ibuprofen (ADVIL) 600 MG tablet Take 1 tablet (600 mg total) by mouth every 6 (six) hours. 30 tablet 0  ? methotrexate (RHEUMATREX) 2.5 MG tablet Take 15 mg by mouth every Sunday.    ? naproxen sodium (ALEVE) 220 MG tablet Take 220 mg by mouth daily as needed (pain.).    ? olmesartan-hydrochlorothiazide (BENICAR HCT) 20-12.5 MG tablet TAKE 1 TABLET BY MOUTH ONCE A DAY 90 tablet 1  ? pantoprazole (PROTONIX) 40 MG tablet Take 1 tablet (40 mg total) by mouth daily. 30 tablet 0  ? phenazopyridine (PYRIDIUM) 200 MG tablet Take 1 tablet (200 mg total) by mouth 3 (three) times daily as needed for pain (for burning). 10 tablet 0  ? sertraline (ZOLOFT) 50 MG tablet Take 1 tablet (50 mg total) by mouth daily. 90 tablet 1  ? ?No current facility-administered medications for this visit.  ? ? ?Allergies:   Acetaminophen and Tramadol  ? ? ?Social History:  The patient  reports that she has never smoked. She has never used smokeless tobacco. She reports current alcohol use. She reports that she does not use drugs.  ? ?Family History:  The patient's family history includes Asthma in her father  and mother; Heart failure in her father; Leukemia in her sister.  ? ? ?ROS:  Please see the history of present illness.   Otherwise, review of systems are positive for none.   All other systems are reviewed and negative.  ? ? ?PHYSICAL EXAM: ?VS:  BP 110/62 (BP Location: Left Arm, Patient Position: Sitting, Cuff Size: Normal)   Pulse 62   Ht '5\' 2"'$  (1.575 m)   Wt 202 lb 2 oz (91.7 kg)   SpO2 98%   BMI 36.97 kg/m?  , BMI Body mass index is 36.97 kg/m?. ?GEN: Well nourished, well developed, in no acute distress  ?HEENT: normal  ?Neck: no JVD, carotid bruits, or masses ?Cardiac: RRR; no murmurs, rubs, or gallops,no edema .   ?Respiratory:  clear to auscultation bilaterally, normal work of breathing ?GI: soft, nontender, nondistended, + BS ?MS: no deformity or atrophy  ?Skin: warm and dry, no rash ?Neuro:  Strength and  sensation are intact ?Psych: euthymic mood, full affect ? ? ?EKG:  EKG is  ordered today. ?EKG showed sinus rhythm with first-degree AV block.  Heart rate is 62 bpm. ? ? ?Recent Labs: ?08/01/2021: ALT 22; Hemoglobin 13.4; Platelets 235; TSH 3.000 ?01/09/2022: BUN 19; Creatinine, Ser 0.84; Potassium 4.0; Sodium 140  ? ? ?Lipid Panel ?   ?Component Value Date/Time  ? CHOL 172 07/09/2021 1601  ? TRIG 215.0 (H) 07/09/2021 1601  ? HDL 59.60 07/09/2021 1601  ? CHOLHDL 3 07/09/2021 1601  ? VLDL 43.0 (H) 07/09/2021 1601  ? LDLDIRECT 85.0 07/09/2021 1601  ? ?  ? ?Wt Readings from Last 3 Encounters:  ?02/12/22 202 lb 2 oz (91.7 kg)  ?01/30/22 205 lb 12.8 oz (93.4 kg)  ?01/29/22 202 lb (91.6 kg)  ?  ? ? ?PAD Screen 06/03/2019  ?Previous PAD dx? No  ?Previous surgical procedure? No  ?Pain with walking? No  ?Feet/toe relief with dangling? No  ?Painful, non-healing ulcers? No  ?Extremities discolored? No  ? ? ? ? ?ASSESSMENT AND PLAN: ? ?1.  Chest pain: Negative cardiac work-up in the past including cardiac catheterization in 2020 which showed normal coronary arteries.  No recurrent chest pain since her anxiety was  treated.  ? ?2.  Palpitations with intermittent tachycardia: Suspect likely sinus tachycardia.  Her symptoms are well controlled with carvedilol. ? ?3.  Essential hypertension: Blood pressure is well controlled

## 2022-02-12 NOTE — Patient Instructions (Signed)

## 2022-02-13 DIAGNOSIS — Z79899 Other long term (current) drug therapy: Secondary | ICD-10-CM | POA: Diagnosis not present

## 2022-02-13 DIAGNOSIS — M0579 Rheumatoid arthritis with rheumatoid factor of multiple sites without organ or systems involvement: Secondary | ICD-10-CM | POA: Diagnosis not present

## 2022-02-15 ENCOUNTER — Other Ambulatory Visit: Payer: Self-pay

## 2022-02-15 ENCOUNTER — Ambulatory Visit
Admission: RE | Admit: 2022-02-15 | Discharge: 2022-02-15 | Disposition: A | Discharge status: Home / Self Care | Payer: Medicare Other | Source: Ambulatory Visit | Attending: Internal Medicine | Admitting: Internal Medicine

## 2022-02-15 DIAGNOSIS — R300 Dysuria: Secondary | ICD-10-CM | POA: Diagnosis not present

## 2022-02-15 DIAGNOSIS — R319 Hematuria, unspecified: Secondary | ICD-10-CM | POA: Diagnosis not present

## 2022-02-15 DIAGNOSIS — Z87442 Personal history of urinary calculi: Secondary | ICD-10-CM | POA: Insufficient documentation

## 2022-02-15 DIAGNOSIS — N281 Cyst of kidney, acquired: Secondary | ICD-10-CM | POA: Diagnosis not present

## 2022-02-15 DIAGNOSIS — R309 Painful micturition, unspecified: Secondary | ICD-10-CM | POA: Diagnosis not present

## 2022-02-21 DIAGNOSIS — Z86711 Personal history of pulmonary embolism: Secondary | ICD-10-CM | POA: Diagnosis not present

## 2022-02-21 DIAGNOSIS — M79671 Pain in right foot: Secondary | ICD-10-CM | POA: Diagnosis not present

## 2022-02-21 DIAGNOSIS — M2011 Hallux valgus (acquired), right foot: Secondary | ICD-10-CM | POA: Diagnosis not present

## 2022-02-21 DIAGNOSIS — M2012 Hallux valgus (acquired), left foot: Secondary | ICD-10-CM | POA: Diagnosis not present

## 2022-02-21 DIAGNOSIS — M069 Rheumatoid arthritis, unspecified: Secondary | ICD-10-CM | POA: Diagnosis not present

## 2022-02-21 DIAGNOSIS — M79672 Pain in left foot: Secondary | ICD-10-CM | POA: Diagnosis not present

## 2022-02-26 ENCOUNTER — Ambulatory Visit: Payer: Medicare Other | Admitting: Family Medicine

## 2022-03-01 ENCOUNTER — Telehealth: Payer: Self-pay | Admitting: Family Medicine

## 2022-03-01 NOTE — Telephone Encounter (Signed)
Pt returning call. Pt requesting callback.  °

## 2022-03-04 NOTE — Telephone Encounter (Signed)
Will call Patient back with results.  ? ? Thressa Sheller, CMA  ?02/28/2022 10:40 AM EDT Back to Top  ?  ?Left message to return call.  ? Delorise Jackson, MD  ?02/18/2022  9:07 AM EDT   ?  ?+fatty liver  ?+lung nodules noted on imaging 03/2020 likely benign (not cancerous) they are stable  ?+6.5 cm cyst right kidney and 2.7 cm cyst left kidney  ?-rec follow up with urology with h/o blood in her urine, does she want referral?  ?  ?Stomach small hiatal hernia  ?+diverticulosis  ??rectal wall thickening 0.19 cm vs ? Stool  ?-rec f/u GI for further exam, is pt agreeable to referral? Rec colonoscopy but can disc with GI ?  ?Small lymph nodes/glands in pelvis ?Blood vessel in bone of back at T11   ? ?

## 2022-03-12 ENCOUNTER — Encounter: Payer: Self-pay | Admitting: Family Medicine

## 2022-03-12 ENCOUNTER — Ambulatory Visit (INDEPENDENT_AMBULATORY_CARE_PROVIDER_SITE_OTHER): Payer: Medicare Other | Admitting: Family Medicine

## 2022-03-12 DIAGNOSIS — R310 Gross hematuria: Secondary | ICD-10-CM

## 2022-03-12 DIAGNOSIS — K629 Disease of anus and rectum, unspecified: Secondary | ICD-10-CM | POA: Diagnosis not present

## 2022-03-12 DIAGNOSIS — R918 Other nonspecific abnormal finding of lung field: Secondary | ICD-10-CM

## 2022-03-12 DIAGNOSIS — M79671 Pain in right foot: Secondary | ICD-10-CM

## 2022-03-12 DIAGNOSIS — M79672 Pain in left foot: Secondary | ICD-10-CM

## 2022-03-12 DIAGNOSIS — Z6837 Body mass index (BMI) 37.0-37.9, adult: Secondary | ICD-10-CM

## 2022-03-12 DIAGNOSIS — M7741 Metatarsalgia, right foot: Secondary | ICD-10-CM | POA: Insufficient documentation

## 2022-03-12 NOTE — Assessment & Plan Note (Signed)
Encouraged healthy diet.  She will need to have her feet sorted out prior to resuming walking for exercise. ?

## 2022-03-12 NOTE — Assessment & Plan Note (Signed)
Refer to GI for further evaluation.  

## 2022-03-12 NOTE — Patient Instructions (Signed)
Nice to see you. ?I have referred you to urology for the blood in your urine. ?I have referred you to GI for the thickening seen in your rectum on the CT scan. ?I have referred you to sports medicine for your feet. ?If you do not hear from any of the specialist in the next 1 to 2 weeks please let us know. ?

## 2022-03-12 NOTE — Progress Notes (Signed)
?[name], MD ?Phone: [phone] ? ?Cindy Stein is a 66 y.o. female who presents today for follow-up. ? ?Obesity: Patient notes she has not been able to exercise much related to foot pain.  She notes her diet has been stable. ? ?Hematuria: Patient had an episode of hematuria.  She was seen by Dr. Terese Door.  CT imaging did not reveal a cause for her symptoms.  It did reveal 2 benign-appearing cysts.  She had some lung nodules that were stable.  She had fatty liver.  She did have some rectal wall thickening.  She has not had any recurrence of hematuria. ? ?Foot pain: Patient notes bilateral foot pain has been going on recently.  She notes the pain is in the mid forefoot near the ball of her foot and occurs dorsally and on the plantar surface.  It occurs between her second and third metatarsal heads.  She saw podiatry.  She had an injection which helped on the left but not on the right.  They noted she had bilateral hallux valgus and thought that that could be causing some stress to MTP joints.  They thought she might have some synovitis as well.  She has been taking Aleve 440 mg once daily as needed for this. ? ?Social History  ? ?Tobacco Use  ?Smoking Status Never  ?Smokeless Tobacco Never  ? ? ?Current Outpatient Medications on File Prior to Visit  ?Medication Sig Dispense Refill  ? albuterol (VENTOLIN HFA) 108 (90 Base) MCG/ACT inhaler Inhale 2 puffs into the lungs every 6 (six) hours as needed for wheezing or shortness of breath. 8 g 2  ? aspirin EC 81 MG tablet Take 81 mg by mouth in the morning. Swallow whole.    ? carvedilol (COREG) 6.25 MG tablet TAKE 1 TABLET BY MOUTH TWICE A DAY 180 tablet 1  ? folic acid (FOLVITE) 1 MG tablet Take 1 mg by mouth in the morning.    ? ibuprofen (ADVIL) 600 MG tablet Take 1 tablet (600 mg total) by mouth every 6 (six) hours. 30 tablet 0  ? methotrexate (RHEUMATREX) 2.5 MG tablet Take 15 mg by mouth every Sunday.    ? naproxen sodium (ALEVE) 220 MG tablet  Take 220 mg by mouth daily as needed (pain.).    ? olmesartan-hydrochlorothiazide (BENICAR HCT) 20-12.5 MG tablet TAKE 1 TABLET BY MOUTH ONCE A DAY 90 tablet 1  ? phenazopyridine (PYRIDIUM) 200 MG tablet Take 1 tablet (200 mg total) by mouth 3 (three) times daily as needed for pain (for burning). 10 tablet 0  ? sertraline (ZOLOFT) 50 MG tablet Take 1 tablet (50 mg total) by mouth daily. 90 tablet 1  ? pantoprazole (PROTONIX) 40 MG tablet Take 1 tablet (40 mg total) by mouth daily. 30 tablet 0  ? ?No current facility-administered medications on file prior to visit.  ? ? ? ?ROS see history of present illness ? ?Objective ? ?Physical Exam ?Vitals:  ? 03/12/22 1042  ?BP: 120/80  ?Pulse: 63  ?Temp: 98.3 ?F (36.8 ?C)  ?SpO2: 96%  ? ? ?BP Readings from Last 3 Encounters:  ?03/12/22 120/80  ?02/12/22 110/62  ?01/30/22 126/80  ? ?Wt Readings from Last 3 Encounters:  ?03/12/22 202 lb 3.2 oz (91.7 kg)  ?02/12/22 202 lb 2 oz (91.7 kg)  ?01/30/22 205 lb 12.8 oz (93.4 kg)  ? ? ?Physical Exam ?Constitutional:   ?   General: She is not in acute distress. ?   Appearance: She is not diaphoretic.  ?Cardiovascular:  ?  Rate and Rhythm: Normal rate and regular rhythm.  ?   Heart sounds: Normal heart sounds.  ?Pulmonary:  ?   Effort: Pulmonary effort is normal.  ?   Breath sounds: Normal breath sounds.  ?Musculoskeletal:  ?   Comments: There is no swelling in either foot, she does have hallux valgus bilaterally, she has tenderness between the second and third metatarsal heads bilaterally with more significant tenderness in the right foot, she has discomfort on squeezing of her forefoot bilaterally right greater than left  ?Skin: ?   General: Skin is warm and dry.  ?Neurological:  ?   Mental Status: She is alert.  ? ? ? ?Assessment/Plan: Please see individual problem list. ? ?Problem List Items Addressed This Visit   ? ? Bilateral foot pain  ?  Possibly related to joint irritation and synovitis.  She could potentially have a Morton's  neuroma or some other issue going on.  Discussed referral to sports medicine for second opinion.  Referral placed.  She can continue over-the-counter Aleve 440 mg once daily as needed.  She will take this with food. ?  ?  ? Relevant Orders  ? Ambulatory referral to Sports Medicine  ? Gross hematuria  ?  Previously referred to urology for microscopic hematuria.  She has recently had gross hematuria with no obvious cause on CT imaging.  We will refer to urology.  Discussed the need for further evaluation to see if there is any underlying lesion that could be contributing to this episode. ?  ?  ? Relevant Orders  ? Ambulatory referral to Urology  ? Obesity  ?  Encouraged healthy diet.  She will need to have her feet sorted out prior to resuming walking for exercise. ?  ?  ? Pulmonary nodules  ?  Noted to be stable on most recent imaging.  No further follow-up needed. ?  ?  ? Rectal abnormality on CT  ?  Refer to GI for further evaluation. ?  ?  ? Relevant Orders  ? Ambulatory referral to Gastroenterology  ? ? ? ?Return in about 3 months (around 06/11/2022) for Weight follow-up. ? ?This visit occurred during the SARS-CoV-2 public health emergency.  Safety protocols were in place, including screening questions prior to the visit, additional usage of staff PPE, and extensive cleaning of exam room while observing appropriate contact time as indicated for disinfecting solutions.  ? ?I have spent 30 minutes in the care of this patient regarding history taking, documentation, completion of exam, discussion of plan, review of recent CT imaging. ? ? ?[name], MD ?[place] ? ?

## 2022-03-12 NOTE — Assessment & Plan Note (Signed)
Previously referred to urology for microscopic hematuria.  She has recently had gross hematuria with no obvious cause on CT imaging.  We will refer to urology.  Discussed the need for further evaluation to see if there is any underlying lesion that could be contributing to this episode. ?

## 2022-03-12 NOTE — Assessment & Plan Note (Signed)
Possibly related to joint irritation and synovitis.  She could potentially have a Morton's neuroma or some other issue going on.  Discussed referral to sports medicine for second opinion.  Referral placed.  She can continue over-the-counter Aleve 440 mg once daily as needed.  She will take this with food. ?

## 2022-03-12 NOTE — Assessment & Plan Note (Signed)
Noted to be stable on most recent imaging.  No further follow-up needed. ?

## 2022-03-14 ENCOUNTER — Encounter: Payer: Self-pay | Admitting: Family Medicine

## 2022-03-14 ENCOUNTER — Ambulatory Visit: Payer: Medicare Other | Admitting: Family Medicine

## 2022-03-14 VITALS — BP 110/72 | HR 71 | Ht 62.0 in | Wt 202.8 lb

## 2022-03-14 DIAGNOSIS — M79671 Pain in right foot: Secondary | ICD-10-CM

## 2022-03-14 DIAGNOSIS — M79672 Pain in left foot: Secondary | ICD-10-CM

## 2022-03-14 DIAGNOSIS — M7741 Metatarsalgia, right foot: Secondary | ICD-10-CM | POA: Diagnosis not present

## 2022-03-14 DIAGNOSIS — M7742 Metatarsalgia, left foot: Secondary | ICD-10-CM

## 2022-03-14 NOTE — Progress Notes (Signed)
? ? ?Subjective:   ? ?CC: Bilat foot pain ? ?I, Wendy Poet, LAT, ATC, am serving as scribe for Dr. Lynne Leader. ? ?HPI: Pt is a 66 y/o female c/o bilat foot pain x /. Pt was seen previously at the [facility] for this complaint on 02/21/22 and the provider noted bilat hallux valgus deformities that may be causing irritation to her 2nd toe and MTP joints. Pt has a hx of rheumatoid arthritis. Pt locates pain to her B 2nd and 3rd MTPJ.  She also has a hx of RA. ? ?Foot swelling: no ?Aggravates: walking/weight-bearing ?Treatments tried: cortisone injections about 3 weeks ago that only helped for about a week. ? ?Dx imaging: 09/03/19 R & L foot XR (done at [facility]) ? ?Pertinent review of Systems: No fevers or chills ? ?Relevant historical information: History of rheumatoid arthritis and DVT and knee replacement. ? ? ?Objective:   ? ?Vitals:  ? 03/14/22 1434  ?BP: 110/72  ?Pulse: 71  ?SpO2: 94%  ? ?General: Well Developed, well nourished, and in no acute distress.  ? ?MSK: Right foot significant bunion formation.  Collapse of transverse arch is present. ?Tender palpation at second and third metatarsal heads plantar and dorsal aspect of the foot. ?Normal foot and ankle motion ? ? ?Left foot significant bony formation.  Collapse of transverse arch is present. ?Tender palpation second third metatarsal heads at the plantar and dorsal aspect of the foot. ?Foot and ankle motion are intact. ? ?Pulses capillary refill and sensation are intact distally bilateral lower extremities. ? ?Lab and Radiology Results ? ?AP oblique and lateral of the right foot reveals moderate hallux valgus  ?deformity with increased intermetatarsal and hallux valgus angles.  Cystic  ?change within the base of the proximal phalanx is noted as well.  Lesser  ?metatarsophalangeal joints are well-maintained.  Noted osteoarthritic  ?changes of the second metatarsal cuneiform joint with loss of joint space  ?and subchondral sclerosis noted.  Moderate  midtarsal joint osteoarthritic  ?changes seen best demonstrated on the lateral view.  Subtalar joints  ?well-maintained.  No signs of acute fracture. ?Specimen Collected: -- Last Resulted: --  ?Date: 09/03/19   ?Received From: [facility]  ?AP oblique and lateral of the left foot reveals hallux valgus deformity  ?with increased intermetatarsal and hallux valgus angles.  Lesser  ?metatarsophalangeal joints are well-maintained.  Osteoarthritic changes  ?noted at the second metatarsal cuneiform joint with loss of joint space  ?and subchondral sclerosis.  Moderate size midtarsal joint spurring is  ?noted best seen on the lateral view with periarticular spurring throughout  ?the area on the lateral view.  Subtalar joint is well-maintained.  No  ?signs of acute fracture. ?Specimen Collected: -- Last Resulted: --  ?Date: 09/03/19   ?Received From: [facility]  ? ? ?Impression and Recommendations:   ? ?Assessment and Plan: ?66 y.o. female with bilateral foot pain located primarily at the second and third metatarsal heads.  This is associate with a large bunion formation.  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 ?I fitted a metatarsal pad (Hapad size small) to her sandal and she tried using that and found it to be much more comfortable and helped to significantly reduce her pain.  ?We will try metatarsal pads for a while and see how that goes.  She is already had a trial of an injection which did not help at her podiatrist is offering her bunion surgery which  is going to be challenging for her given her rheumatoid arthritis and age. ?Recheck in 1 month. ? ? ?Discussed warning signs or symptoms. Please see discharge instructions. Patient expresses understanding. ? ? ?The above documentation has been reviewed and is accurate and complete Lynne Leader, M.D. ? ?

## 2022-03-14 NOTE — Patient Instructions (Addendum)
Nice to meet you today. ? ?Small metatarsal (MT) pads.  You may order more through [email or web address] ? ?Follow-up: 6 weeks ?

## 2022-03-20 ENCOUNTER — Ambulatory Visit: Payer: Medicare Other | Admitting: Urology

## 2022-03-20 ENCOUNTER — Encounter: Payer: Self-pay | Admitting: Urology

## 2022-03-20 VITALS — BP 136/76 | HR 60 | Ht 62.0 in | Wt 202.0 lb

## 2022-03-20 DIAGNOSIS — R8281 Pyuria: Secondary | ICD-10-CM

## 2022-03-20 DIAGNOSIS — R319 Hematuria, unspecified: Secondary | ICD-10-CM

## 2022-03-20 LAB — URINALYSIS, COMPLETE
Bilirubin, UA: NEGATIVE
Glucose, UA: NEGATIVE
Ketones, UA: NEGATIVE
Nitrite, UA: NEGATIVE
Protein,UA: NEGATIVE
Specific Gravity, UA: >1.03 — ABNORMAL HIGH (ref 1.005–1.030)
Urobilinogen, Ur: 0.2 mg/dL (ref 0.2–1.0)
pH, UA: 6 (ref 5.0–7.5)

## 2022-03-20 LAB — MICROSCOPIC EXAMINATION

## 2022-03-20 NOTE — Progress Notes (Signed)
? ?03/20/2022 ?12:49 PM  ? ?Cindy Stein ?[birth year] ?[id number] ? ?Referring provider: Leone Haven, MD ?[address] ?[address] ?[place],  [place] [address] ? ?Chief Complaint  ?Patient presents with  ? Hematuria  ? ? ?HPI: ?Cindy Stein is a 66 y.o. female referred for evaluation of hematuria. ? ?Saw Dr. Terese Door 01/30/2022 complaining of dysuria associated with hematuria ?Prior history of a stone in [place] on the right ?UA at that visit 3-6 WBCs and no RBCs ?Started on Cipro and stone protocol CT ordered ?States symptoms resolved with antibiotics; urine culture with insignificant growth ?Stone protocol CT 02/15/2022 showed a benign-appearing renal cysts ?Denies recurrent hematuria or bothersome voiding symptoms ? ? ?PMH: ?Past Medical History:  ?Diagnosis Date  ? Anxiety   ? Atypical chest pain   ? DOE (dyspnea on exertion)   ? DVT of lower extremity (deep venous thrombosis) ([place]) 03/20/2020  ? a.) LEFT femoral, popliteal, posterior tibial, and paroneal veins; Tx'd with Rivaroxaban; b.) embolized on 03/21/2020 requiring PA thrombolysis and mechanical thrombectomy on 03/22/2020  ? Grade I diastolic dysfunction 11/20/7587  ? History of immunosuppression   ? long-term MTX for RA diagnosis  ? History of PSVT (paroxysmal supraventricular tachycardia) 07/22/2019  ? a.) brief 5 beat run noted on [place]  ? HLD (hyperlipidemia)   ? Hypertension   ? Kidney stone   ? in [place] (right)  ? Osteoarthritis   ? Plantar fascial fibromatosis   ? Pulmonary embolus ([place]) 03/21/2020  ? a.) BILATERAL; required PA thrombolysis and mechanical thrombectomy on 03/22/2020; b.) IVC filter placed prior to knee surgery on 12/12/2020 and removed on 04/19/2021  ? PVC (premature ventricular contraction)   ? Rheumatoid arthritis ([place])   ? Valgus deformity of great toes, bilateral   ? Vitamin D deficiency   ? ? ?Surgical History: ?Past Surgical History:  ?Procedure Laterality Date  ? ANTERIOR AND POSTERIOR REPAIR N/A 07/23/2021  ?  Procedure: ANTERIOR (CYSTOCELE) AND POSTERIOR REPAIR (RECTOCELE);  Surgeon: Harlin Heys, MD;  Location: [facility] OR[facility];  Service: Gynecology;  Laterality: N/A;  ? CARDIAC CATHETERIZATION  2006  ? carpel tunnel Bilateral   ? CATARACT EXTRACTION, BILATERAL    ? CHOLECYSTECTOMY N/A 2006  ? IVC FILTER INSERTION N/A 12/11/2020  ? Procedure: IVC FILTER INSERTION;  Surgeon: Algernon Huxley, MD;  Location: [place] [facility];  Service: Cardiovascular;  Laterality: N/A;  ? IVC FILTER REMOVAL N/A 04/19/2021  ? Procedure: IVC FILTER REMOVAL;  Surgeon: Algernon Huxley, MD;  Location: [place] [facility];  Service: Cardiovascular;  Laterality: N/A;  ? JOINT REPLACEMENT Right 2021  ? KNEE  ? LEFT HEART CATH AND CORONARY ANGIOGRAPHY Left 06/14/2019  ? Procedure: LEFT HEART CATH AND CORONARY ANGIOGRAPHY;  Surgeon: Wellington Hampshire, MD;  Location: [place] [facility];  Service: Cardiovascular;  Laterality: Left;  ? PUBOVAGINAL SLING N/A 07/23/2021  ? Procedure: PUBO-VAGINAL SLING;  Surgeon: Harlin Heys, MD;  Location: [facility];  Service: Gynecology;  Laterality: N/A;  ? PULMONARY THROMBECTOMY Bilateral 03/22/2020  ? Procedure: PULMONARY THROMBECTOMY / THROMBOLYSIS;  Surgeon: Algernon Huxley, MD;  Location: [place] [facility];  Service: Cardiovascular;  Laterality: Bilateral;  ? REPLACEMENT TOTAL KNEE Left 2022  ? VAGINAL HYSTERECTOMY N/A 07/23/2021  ? Procedure: HYSTERECTOMY VAGINAL;  Surgeon: Harlin Heys, MD;  Location: [facility];  Service: Gynecology;  Laterality: N/A;  ? ? ?Home Medications:  ?Allergies as of 03/20/2022   ? ?   Reactions  ? Acetaminophen Other (See Comments)  ?  Tylenol is ineffective  ? Tramadol Other (See Comments)  ? Heavily sedated "feels drunk"  ? ?  ? ?  ?Medication List  ?  ? ?  ? Accurate as of March 20, 2022 12:49 PM. If you have any questions, ask your nurse or doctor.  ?  ?  ? ?  ? ?albuterol 108 (90 Base) MCG/ACT inhaler ?Commonly known as: VENTOLIN HFA ?Inhale 2 puffs into the lungs every 6  (six) hours as needed for wheezing or shortness of breath. ?  ?aspirin EC 81 MG tablet ?Take 81 mg by mouth in the morning. Swallow whole. ?  ?carvedilol 6.25 MG tablet ?Commonly known as: COREG ?TAKE 1 TABLET BY MOUTH TWICE A DAY ?  ?folic acid 1 MG tablet ?Commonly known as: FOLVITE ?Take 1 mg by mouth in the morning. ?  ?methotrexate 2.5 MG tablet ?Commonly known as: RHEUMATREX ?Take 15 mg by mouth every Sunday. ?  ?naproxen sodium 220 MG tablet ?Commonly known as: ALEVE ?Take 220 mg by mouth daily as needed (pain.). ?  ?olmesartan-hydrochlorothiazide 20-12.5 MG tablet ?Commonly known as: BENICAR HCT ?TAKE 1 TABLET BY MOUTH ONCE A DAY ?  ?pantoprazole 40 MG tablet ?Commonly known as: PROTONIX ?Take 1 tablet (40 mg total) by mouth daily. ?  ?phenazopyridine 200 MG tablet ?Commonly known as: Pyridium ?Take 1 tablet (200 mg total) by mouth 3 (three) times daily as needed for pain (for burning). ?  ?sertraline 50 MG tablet ?Commonly known as: ZOLOFT ?Take 1 tablet (50 mg total) by mouth daily. ?  ? ?  ? ? ?Allergies:  ?Allergies  ?Allergen Reactions  ? Acetaminophen Other (See Comments)  ?  Tylenol is ineffective  ? Tramadol Other (See Comments)  ?  Heavily sedated "feels drunk"  ? ? ?Family History: ?Family History  ?Problem Relation Age of Onset  ? Asthma Mother   ? Asthma Father   ? Heart failure Father   ? Leukemia Sister   ? Breast cancer Neg Hx   ? ? ?Social History:  reports that she has never smoked. She has never used smokeless tobacco. She reports current alcohol use. She reports that she does not use drugs. ? ? ?Physical Exam: ?BP 136/76   Pulse 60   Ht '5\' 2"'$  (1.575 m)   Wt 202 lb (91.6 kg)   BMI 36.95 kg/m?   ?Constitutional:  Alert and oriented, No acute distress. ?HEENT: [place] AT, moist mucus membranes.  Trachea midline, no masses. ?Respiratory: Normal respiratory effort, no increased work of breathing. ?Neurologic: Grossly intact, no focal deficits, moving all 4 extremities. ?Psychiatric: Normal mood  and affect. ? ?Laboratory Data: ? ?Urinalysis ?Microscopy 6-10 WBC/0 RBC/moderate bacteria ? ? ?Pertinent Imaging: ?CT images were personally reviewed and interpreted  ? ?CT RENAL STONE STUDY ? ?Narrative ?CLINICAL DATA:  Nephrolithiasis. Hematuria. Painful urination. ?Dysuria. History of kidney stones. ? ?EXAM: ?CT ABDOMEN AND PELVIS WITHOUT CONTRAST ? ?TECHNIQUE: ?Multidetector CT imaging of the abdomen and pelvis was performed ?following the standard protocol without IV contrast. ? ?RADIATION DOSE REDUCTION: This exam was performed according to the ?departmental dose-optimization program which includes automated ?exposure control, adjustment of the mA and/or kV according to ?patient size and/or use of iterative reconstruction technique. ? ?COMPARISON:  No prior abdominal imaging available. ? ?FINDINGS: ?Lower chest: There are multiple small basilar pulmonary nodules, ?largest in the right middle lobe measuring 4 mm. These are not ?significantly changed from April 2021 chest exam, demonstrating 2 ?years of imaging stability and considered benign. No  acute airspace ?disease. No pleural effusion. ? ?Hepatobiliary: Diffusely decreased hepatic density consistent with ?steatosis. There is mild focal fatty sparing adjacent to the ?caudate. No discrete liver lesion. Clips in the gallbladder fossa ?postcholecystectomy. No biliary dilatation. ? ?Pancreas: Unremarkable. No pancreatic ductal dilatation or ?surrounding inflammatory changes. ? ?Spleen: Normal in size without focal abnormality. ? ?Adrenals/Urinary Tract: Normal adrenal glands. There is a 6.5 cm ?cyst arising from the lower pole of the right kidney, measuring ?simple fluid density. Small septation inferiorly. 2.7 cm cyst in the ?anterior lower left kidney. No hydronephrosis. No intrarenal ?calculi. No evidence of solid renal lesion on this unenhanced exam. ?Both ureters are decompressed without stones along the course. ?Partially distended urinary bladder. There  is no bladder wall ?thickening. No bladder stone. ? ?Stomach/Bowel: Tiny hiatal hernia. The stomach is nondistended. ?Normal small bowel without obstruction or inflammation. Normal ?appendix. Multifocal coloni

## 2022-03-26 LAB — CULTURE, URINE COMPREHENSIVE

## 2022-03-30 DIAGNOSIS — M79645 Pain in left finger(s): Secondary | ICD-10-CM | POA: Insufficient documentation

## 2022-03-30 DIAGNOSIS — S62657A Nondisplaced fracture of medial phalanx of left little finger, initial encounter for closed fracture: Secondary | ICD-10-CM | POA: Diagnosis not present

## 2022-04-03 ENCOUNTER — Encounter: Payer: Self-pay | Admitting: Urology

## 2022-04-03 ENCOUNTER — Ambulatory Visit: Payer: Medicare Other | Admitting: Urology

## 2022-04-03 VITALS — BP 124/69 | HR 63 | Ht 62.0 in | Wt 202.0 lb

## 2022-04-03 DIAGNOSIS — N390 Urinary tract infection, site not specified: Secondary | ICD-10-CM

## 2022-04-03 DIAGNOSIS — R310 Gross hematuria: Secondary | ICD-10-CM

## 2022-04-03 DIAGNOSIS — R8281 Pyuria: Secondary | ICD-10-CM

## 2022-04-03 LAB — URINALYSIS, COMPLETE
Bilirubin, UA: NEGATIVE
Glucose, UA: NEGATIVE
Ketones, UA: NEGATIVE
Leukocytes,UA: NEGATIVE
Nitrite, UA: NEGATIVE
Protein,UA: NEGATIVE
Specific Gravity, UA: >1.03 — ABNORMAL HIGH (ref 1.005–1.030)
Urobilinogen, Ur: 0.2 mg/dL (ref 0.2–1.0)
pH, UA: 5.5 (ref 5.0–7.5)

## 2022-04-03 LAB — MICROSCOPIC EXAMINATION: Bacteria, UA: NONE SEEN

## 2022-04-03 NOTE — Progress Notes (Signed)
? ?  04/03/22 ? ?CC:  ?Chief Complaint  ?Patient presents with  ? Cysto  ? ? ?HPI: Refer to my previous note 03/20/2022.  UA today clear ? ?Blood pressure 124/69, pulse 63, height '5\' 2"'$  (1.575 m), weight 202 lb (91.6 kg). ?NED. A&Ox3.   ?No respiratory distress   ?Abd soft, NT, ND ?Atrophic external genitalia with patent urethral meatus.  Large rectocele protruding through introitus ? ?Cystoscopy Procedure Note ? ?Patient identification was confirmed, informed consent was obtained, and patient was prepped using Betadine solution.  Lidocaine jelly was administered per urethral meatus.   ? ?Procedure: ?- Flexible cystoscope introduced, without any difficulty.   ?- Thorough search of the bladder revealed: ?   normal urethral meatus ?   normal urothelium ?   no stones ?   no ulcers  ?   no tumors ?   no urethral polyps ?   no trabeculation ? ?- Ureteral orifices were normal in position and appearance. ? ?Post-Procedure: ?- Patient tolerated the procedure well ? ?Assessment/ Plan: ?No mucosal abnormalities on cystoscopy ?Hematuria most likely secondary to previous infection ?UTI prevention as discussed previous office visit ?Return as needed ? ? ?Abbie Sons, MD ? ?

## 2022-04-03 NOTE — Progress Notes (Signed)
In and Out Catheterization ? ?Patient is present today for a I & O catheterization due to cysto. Patient was cleaned and prepped in a sterile fashion with betadine . A 14FR cath was inserted no complications were noted , 58m of urine return was noted, urine was yellow in color. A clean urine sample was collected for UA. Bladder was drained  And catheter was removed with out difficulty.   ? ?Performed by: CElberta Leatherwood CMA ? ? ?

## 2022-04-16 DIAGNOSIS — M79645 Pain in left finger(s): Secondary | ICD-10-CM | POA: Diagnosis not present

## 2022-04-17 ENCOUNTER — Other Ambulatory Visit: Payer: Self-pay | Admitting: Family Medicine

## 2022-04-17 DIAGNOSIS — I10 Essential (primary) hypertension: Secondary | ICD-10-CM

## 2022-04-22 ENCOUNTER — Ambulatory Visit: Payer: Medicare Other | Admitting: Family Medicine

## 2022-04-25 ENCOUNTER — Other Ambulatory Visit: Payer: Self-pay

## 2022-04-25 ENCOUNTER — Telehealth: Payer: Self-pay | Admitting: Family Medicine

## 2022-04-25 DIAGNOSIS — R310 Gross hematuria: Secondary | ICD-10-CM

## 2022-04-25 DIAGNOSIS — Z1239 Encounter for other screening for malignant neoplasm of breast: Secondary | ICD-10-CM

## 2022-04-25 DIAGNOSIS — R8290 Unspecified abnormal findings in urine: Secondary | ICD-10-CM

## 2022-04-25 NOTE — Telephone Encounter (Signed)
Pt called in stating that she tried to schedule her mammogram follow up appt at [facility]... Pt stated that [place] advised her that she needed to contact her provider office to get and Mammogram Orders placed before she can schedule appt... Pt is requesting mammogram orders to be placed... Pt requesting callback at [phone]

## 2022-04-30 ENCOUNTER — Other Ambulatory Visit: Payer: Self-pay

## 2022-04-30 DIAGNOSIS — M7741 Metatarsalgia, right foot: Secondary | ICD-10-CM | POA: Diagnosis not present

## 2022-04-30 DIAGNOSIS — N6001 Solitary cyst of right breast: Secondary | ICD-10-CM

## 2022-04-30 DIAGNOSIS — M7661 Achilles tendinitis, right leg: Secondary | ICD-10-CM | POA: Diagnosis not present

## 2022-04-30 DIAGNOSIS — M65871 Other synovitis and tenosynovitis, right ankle and foot: Secondary | ICD-10-CM | POA: Diagnosis not present

## 2022-04-30 DIAGNOSIS — M2010 Hallux valgus (acquired), unspecified foot: Secondary | ICD-10-CM | POA: Insufficient documentation

## 2022-04-30 DIAGNOSIS — M19072 Primary osteoarthritis, left ankle and foot: Secondary | ICD-10-CM | POA: Diagnosis not present

## 2022-04-30 DIAGNOSIS — M069 Rheumatoid arthritis, unspecified: Secondary | ICD-10-CM | POA: Diagnosis not present

## 2022-04-30 DIAGNOSIS — M65972 Unspecified synovitis and tenosynovitis, left ankle and foot: Secondary | ICD-10-CM | POA: Insufficient documentation

## 2022-04-30 DIAGNOSIS — M2012 Hallux valgus (acquired), left foot: Secondary | ICD-10-CM | POA: Diagnosis not present

## 2022-04-30 DIAGNOSIS — M7662 Achilles tendinitis, left leg: Secondary | ICD-10-CM | POA: Diagnosis not present

## 2022-04-30 DIAGNOSIS — M659 Synovitis and tenosynovitis, unspecified: Secondary | ICD-10-CM | POA: Insufficient documentation

## 2022-04-30 DIAGNOSIS — M19071 Primary osteoarthritis, right ankle and foot: Secondary | ICD-10-CM | POA: Diagnosis not present

## 2022-04-30 DIAGNOSIS — M65872 Other synovitis and tenosynovitis, left ankle and foot: Secondary | ICD-10-CM | POA: Diagnosis not present

## 2022-04-30 DIAGNOSIS — M7660 Achilles tendinitis, unspecified leg: Secondary | ICD-10-CM | POA: Insufficient documentation

## 2022-04-30 DIAGNOSIS — M2011 Hallux valgus (acquired), right foot: Secondary | ICD-10-CM | POA: Diagnosis not present

## 2022-04-30 DIAGNOSIS — M7742 Metatarsalgia, left foot: Secondary | ICD-10-CM | POA: Diagnosis not present

## 2022-04-30 NOTE — Telephone Encounter (Signed)
I called and LV informing the patient that I ordered her [place] and [place] of her right breast and she can call now and schedule. Chameka Mcmullen,cma

## 2022-04-30 NOTE — Addendum Note (Signed)
Addended by: [name], Karon Cotterill G on: 04/30/2022 12:10 PM   Modules accepted: Orders

## 2022-05-07 DIAGNOSIS — M19072 Primary osteoarthritis, left ankle and foot: Secondary | ICD-10-CM | POA: Diagnosis not present

## 2022-05-21 ENCOUNTER — Ambulatory Visit
Admission: RE | Admit: 2022-05-21 | Discharge: 2022-05-21 | Disposition: A | Discharge status: Home / Self Care | Payer: Medicare Other | Source: Ambulatory Visit | Attending: Family Medicine | Admitting: Family Medicine

## 2022-05-21 DIAGNOSIS — N6011 Diffuse cystic mastopathy of right breast: Secondary | ICD-10-CM | POA: Diagnosis not present

## 2022-05-21 DIAGNOSIS — N6001 Solitary cyst of right breast: Secondary | ICD-10-CM

## 2022-05-21 DIAGNOSIS — R928 Other abnormal and inconclusive findings on diagnostic imaging of breast: Secondary | ICD-10-CM | POA: Diagnosis not present

## 2022-05-22 ENCOUNTER — Other Ambulatory Visit: Payer: Medicare Other

## 2022-05-28 DIAGNOSIS — M7741 Metatarsalgia, right foot: Secondary | ICD-10-CM | POA: Diagnosis not present

## 2022-05-28 DIAGNOSIS — M65871 Other synovitis and tenosynovitis, right ankle and foot: Secondary | ICD-10-CM | POA: Diagnosis not present

## 2022-05-28 DIAGNOSIS — G5762 Lesion of plantar nerve, left lower limb: Secondary | ICD-10-CM | POA: Insufficient documentation

## 2022-05-28 DIAGNOSIS — M2011 Hallux valgus (acquired), right foot: Secondary | ICD-10-CM | POA: Diagnosis not present

## 2022-05-28 DIAGNOSIS — G5761 Lesion of plantar nerve, right lower limb: Secondary | ICD-10-CM | POA: Diagnosis not present

## 2022-05-28 DIAGNOSIS — M069 Rheumatoid arthritis, unspecified: Secondary | ICD-10-CM | POA: Diagnosis not present

## 2022-05-28 DIAGNOSIS — M2012 Hallux valgus (acquired), left foot: Secondary | ICD-10-CM | POA: Diagnosis not present

## 2022-05-28 DIAGNOSIS — M7742 Metatarsalgia, left foot: Secondary | ICD-10-CM | POA: Diagnosis not present

## 2022-05-28 DIAGNOSIS — M65872 Other synovitis and tenosynovitis, left ankle and foot: Secondary | ICD-10-CM | POA: Diagnosis not present

## 2022-06-05 ENCOUNTER — Emergency Department: Payer: Medicare Other

## 2022-06-05 ENCOUNTER — Encounter: Payer: Self-pay | Admitting: Medical Oncology

## 2022-06-05 ENCOUNTER — Other Ambulatory Visit: Payer: Self-pay | Admitting: Family Medicine

## 2022-06-05 ENCOUNTER — Emergency Department
Admission: EM | Admit: 2022-06-05 | Discharge: 2022-06-06 | Disposition: A | Discharge status: Home / Self Care | Payer: Medicare Other | Attending: Emergency Medicine | Admitting: Emergency Medicine

## 2022-06-05 DIAGNOSIS — R1011 Right upper quadrant pain: Secondary | ICD-10-CM | POA: Diagnosis present

## 2022-06-05 DIAGNOSIS — I10 Essential (primary) hypertension: Secondary | ICD-10-CM | POA: Insufficient documentation

## 2022-06-05 DIAGNOSIS — R1084 Generalized abdominal pain: Secondary | ICD-10-CM | POA: Diagnosis not present

## 2022-06-05 DIAGNOSIS — R109 Unspecified abdominal pain: Secondary | ICD-10-CM | POA: Diagnosis not present

## 2022-06-05 DIAGNOSIS — I700 Atherosclerosis of aorta: Secondary | ICD-10-CM | POA: Diagnosis not present

## 2022-06-05 DIAGNOSIS — F32A Depression, unspecified: Secondary | ICD-10-CM

## 2022-06-05 DIAGNOSIS — K760 Fatty (change of) liver, not elsewhere classified: Secondary | ICD-10-CM | POA: Diagnosis not present

## 2022-06-05 LAB — CBC WITH DIFFERENTIAL/PLATELET
Abs Immature Granulocytes: 0.02 10*3/uL (ref 0.00–0.07)
Basophils Absolute: 0 10*3/uL (ref 0.0–0.1)
Basophils Relative: 1 %
Eosinophils Absolute: 0.2 10*3/uL (ref 0.0–0.5)
Eosinophils Relative: 2 %
HCT: 42 % (ref 36.0–46.0)
Hemoglobin: 14 g/dL (ref 12.0–15.0)
Immature Granulocytes: 0 %
Lymphocytes Relative: 34 %
Lymphs Abs: 2.7 10*3/uL (ref 0.7–4.0)
MCH: 29.3 pg (ref 26.0–34.0)
MCHC: 33.3 g/dL (ref 30.0–36.0)
MCV: 87.9 fL (ref 80.0–100.0)
Monocytes Absolute: 0.6 10*3/uL (ref 0.1–1.0)
Monocytes Relative: 8 %
Neutro Abs: 4.2 10*3/uL (ref 1.7–7.7)
Neutrophils Relative %: 55 %
Platelets: 219 10*3/uL (ref 150–400)
RBC: 4.78 MIL/uL (ref 3.87–5.11)
RDW: 13 % (ref 11.5–15.5)
WBC: 7.8 10*3/uL (ref 4.0–10.5)
nRBC: 0 % (ref 0.0–0.2)

## 2022-06-05 LAB — URINALYSIS, ROUTINE W REFLEX MICROSCOPIC
Bilirubin Urine: NEGATIVE
Glucose, UA: NEGATIVE mg/dL
Hgb urine dipstick: NEGATIVE
Ketones, ur: NEGATIVE mg/dL
Nitrite: NEGATIVE
Protein, ur: NEGATIVE mg/dL
Specific Gravity, Urine: 1.02 (ref 1.005–1.030)
pH: 5 (ref 5.0–8.0)

## 2022-06-05 LAB — LIPASE, BLOOD: Lipase: 50 U/L (ref 11–51)

## 2022-06-05 LAB — COMPREHENSIVE METABOLIC PANEL
ALT: 26 U/L (ref 0–44)
AST: 26 U/L (ref 15–41)
Albumin: 3.8 g/dL (ref 3.5–5.0)
Alkaline Phosphatase: 80 U/L (ref 38–126)
Anion gap: 10 (ref 5–15)
BUN: 23 mg/dL (ref 8–23)
CO2: 24 mmol/L (ref 22–32)
Calcium: 9.1 mg/dL (ref 8.9–10.3)
Chloride: 106 mmol/L (ref 98–111)
Creatinine, Ser: 0.92 mg/dL (ref 0.44–1.00)
GFR, Estimated: >60 mL/min (ref >60)
Glucose, Bld: 99 mg/dL (ref 70–99)
Potassium: 3.7 mmol/L (ref 3.5–5.1)
Sodium: 140 mmol/L (ref 135–145)
Total Bilirubin: 0.9 mg/dL (ref 0.3–1.2)
Total Protein: 6.9 g/dL (ref 6.5–8.1)

## 2022-06-05 LAB — TROPONIN I (HIGH SENSITIVITY)
Troponin I (High Sensitivity): 7 ng/L (ref <18)
Troponin I (High Sensitivity): 6 ng/L (ref <18)

## 2022-06-05 MED ORDER — HYDROCODONE-ACETAMINOPHEN 5-325 MG PO TABS
1.0000 | ORAL_TABLET | Freq: Three times a day (TID) | ORAL | 0 refills | Status: AC | PRN
Start: 2022-06-05 — End: 2022-06-08

## 2022-06-05 MED ORDER — MORPHINE SULFATE (PF) 4 MG/ML IV SOLN
4.0000 mg | Freq: Once | INTRAVENOUS | Status: AC
  Administered 2022-06-05: 4 mg via INTRAVENOUS
  Filled 2022-06-05: qty 1

## 2022-06-05 MED ORDER — ALUM & MAG HYDROXIDE-SIMETH 200-200-20 MG/5ML PO SUSP
30.0000 mL | Freq: Once | ORAL | Status: AC
  Administered 2022-06-05: 30 mL via ORAL

## 2022-06-05 MED ORDER — IOHEXOL 300 MG/ML  SOLN
100.0000 mL | Freq: Once | INTRAMUSCULAR | Status: AC | PRN
  Administered 2022-06-05: 100 mL via INTRAVENOUS

## 2022-06-05 MED ORDER — ALUM & MAG HYDROXIDE-SIMETH 200-200-20 MG/5ML PO SUSP
15.0000 mL | Freq: Once | ORAL | Status: DC
  Filled 2022-06-05: qty 30

## 2022-06-05 MED ORDER — ONDANSETRON HCL 4 MG/2ML IJ SOLN
4.0000 mg | Freq: Once | INTRAMUSCULAR | Status: AC
  Administered 2022-06-05: 4 mg via INTRAVENOUS
  Filled 2022-06-05: qty 2

## 2022-06-05 MED ORDER — LIDOCAINE VISCOUS HCL 2 % MT SOLN
15.0000 mL | Freq: Once | OROMUCOSAL | Status: AC
  Administered 2022-06-05: 15 mL via OROMUCOSAL
  Filled 2022-06-05: qty 15

## 2022-06-05 NOTE — ED Triage Notes (Signed)
Pt reports rt sided upper abd pain that began 3 days ago. Denies NVD. Recently started on new RA medication, possible side effect. Denies NVD. Denies fever.

## 2022-06-05 NOTE — ED Provider Triage Note (Signed)
Emergency Medicine Provider Triage Evaluation Note  Cindy Stein , a 66 y.o. female  was evaluated in triage.  Pt complains of right side back for 3 days. Daughter thinks it is from Enbrel which she started 2 months ago.  No vomiting. No fever. Does not have gall bladder.   Review of Systems  Positive: Right upper quadrant pain Negative: Vomiting, fever  Physical Exam  There were no vitals taken for this visit. Gen:   Awake, no distress   Resp:  Normal effort  MSK:   Moves extremities without difficulty  Other:    Medical Decision Making  Medically screening exam initiated at 12:52 PM.  Appropriate orders placed.  Deb Loudin was informed that the remainder of the evaluation will be completed by another provider, this initial triage assessment does not replace that evaluation, and the importance of remaining in the ED until their evaluation is complete.     Marquette Old, PA-C 06/05/22 1256

## 2022-06-05 NOTE — ED Provider Notes (Signed)
[facility] Emergency Department Provider Note     Event Date/Time   First MD Initiated Contact with Patient 06/05/22 1727     (approximate)   History   Abdominal Pain   HPI  Cindy Stein is a 66 y.o. female with a history of RA, HTN, prediabetes, history of kidney stones, and s/p cholecystectomy. She has been on Enbrel for the last month, with the last weekly injection on Friday, 6/30. She reports sharp, stabbing pain to the RUQ that is aggravated by movement and deep breaths. She denies FCS, NVD, hematuria, cough, chest pain, SOB, or rash.    Physical Exam   Triage Vital Signs: ED Triage Vitals  Enc Vitals Group     BP 06/05/22 1253 (!) 109/59     Pulse Rate 06/05/22 1253 70     Resp 06/05/22 1253 17     Temp 06/05/22 1253 97.7 F (36.5 C)     Temp Source 06/05/22 1253 Oral     SpO2 06/05/22 1253 96 %     Weight 06/05/22 1253 194 lb (88 kg)     Height 06/05/22 1253 '5\' 3"'$  (1.6 m)     Head Circumference --      Peak Flow --      Pain Score 06/05/22 1337 6     Pain Loc --      Pain Edu? --      Excl. in [place]? --     Most recent vital signs: Vitals:   06/05/22 2239 06/06/22 0055  BP: (!) 147/85 (!) 141/85  Pulse: 63 61  Resp: 20 20  Temp:    SpO2: 100% 100%    General Awake, no distress.  CV:  Good peripheral perfusion. RRR RESP:  Normal effort. CTA ABD:  No distention.  Patient tender to palpation to the right upper quadrant.  Normal bowel sounds appreciated.  No rebound, guarding, or rigidity.  No CVA tenderness elicited.  Patient later endorsed acute epigastric abdominal pain, and now with tenderness to palpation across the epigastrium.   ED Results / Procedures / Treatments   Labs (all labs ordered are listed, but only abnormal results are displayed) Labs Reviewed  URINALYSIS, ROUTINE W REFLEX MICROSCOPIC - Abnormal; Notable for the following components:      Result Value   Color, Urine YELLOW (*)    APPearance HAZY (*)     Leukocytes,Ua LARGE (*)    Bacteria, UA RARE (*)    All other components within normal limits  COMPREHENSIVE METABOLIC PANEL  CBC WITH DIFFERENTIAL/PLATELET  LIPASE, BLOOD  H PYLORI, IGM, IGG, IGA AB  TROPONIN I (HIGH SENSITIVITY)  TROPONIN I (HIGH SENSITIVITY)     EKG   RADIOLOGY  I personally viewed and evaluated these images as part of my medical decision making, as well as reviewing the written report by the radiologist.  ED Provider Interpretation: no acute findings}  No results found.   PROCEDURES:  Critical Care performed: No  Procedures   MEDICATIONS ORDERED IN ED: Medications  iohexol (OMNIPAQUE) 300 MG/ML solution 100 mL (100 mLs Intravenous Contrast Given 06/05/22 2028)  morphine (PF) 4 MG/ML injection 4 mg (4 mg Intravenous Given 06/05/22 2149)  ondansetron (ZOFRAN) injection 4 mg (4 mg Intravenous Given 06/05/22 2147)  lidocaine (XYLOCAINE) 2 % viscous mouth solution 15 mL (15 mLs Mouth/Throat Given 06/05/22 2321)  alum & mag hydroxide-simeth (MAALOX/MYLANTA) 200-200-20 MG/5ML suspension 30 mL (30 mLs Oral Given 06/05/22 2321)  pantoprazole (PROTONIX) EC tablet  40 mg (40 mg Oral Given 06/06/22 0028)     IMPRESSION / MDM / ASSESSMENT AND PLAN / ED COURSE  I reviewed the triage vital signs and the nursing notes.                              Differential diagnosis includes, but is not limited to, biliary disease (biliary colic, acute cholecystitis, cholangitis, choledocholithiasis, etc), intrathoracic causes for epigastric abdominal pain including ACS, gastritis, duodenitis, pancreatitis, small bowel or large bowel obstruction, abdominal aortic aneurysm, hernia, and ulcer(s).   Patient's presentation is most consistent with acute complicated illness / injury requiring diagnostic workup.  ----------------------------------------- 11:33 PM on 06/05/2022 ----------------------------------------- Patient with significant improvement of her symptoms during interim  evaluation.  Patient has been updated on her normal CT scan without evidence of acute biliary duct dilatation, hepatic changes, pancreatitis, colitis, or appendicitis.  Patient daughter at bedside to receive discharge instructions, when I notified by the nurse that the patient apparently had a sudden onset of epigastric abdominal pain.  The daughter apparently is upset feeling that the patient has not been thoroughly evaluated here in the ED.  I updated the daughter on the full GI work-up that the patient received without any acute findings.  Daughter then notifies me that the patient has some nondescript remote history of potential gastric ulcer, and should be on H2 blockade.  Patient is treated with a GI cocktail as well as an oral dose of pantoprazole.  Within half hour, the patient is again stable without acute complaints and pain is currently resolved.  Patient is referred to GI medicine for further evaluation.  She has an outpatient endoscopy and colonoscopy procedure scheduled for September at this time.  Patient to the ED with initial right upper quadrant and then epigastric abdominal pain on presentation.  Ultrasound of the right upper quad reveals an absent gallbladder and no evidence of biliary dilatation.  Further evaluation with contrasted CT does not reveal any acute findings patient's diagnosis is consistent with plain the patient's epigastric/right upper quadrant abdominal pain.  Patient is treated in the ED with anti medics, GI cocktail, pain medicine, and pantoprazole.  Patient is stable at this time without ongoing complaints of pain.  She and her daughter been updated on the complete work-up and recommendations for outpatient management.  Patient will be discharged home with prescriptions for pain medicine, nausea medicine, pantoprazole. Patient is to follow up with GI as needed or otherwise directed. Patient is given ED precautions to return to the ED for any worsening or new symptoms.      FINAL CLINICAL IMPRESSION(S) / ED DIAGNOSES   Final diagnoses:  Generalized abdominal pain     Rx / DC Orders   ED Discharge Orders          Ordered    pantoprazole (PROTONIX) 40 MG tablet  Daily        06/06/22 0003    ondansetron (ZOFRAN-ODT) 4 MG disintegrating tablet  Every 8 hours PRN        06/06/22 0003    HYDROcodone-acetaminophen (NORCO) 5-325 MG tablet  3 times daily PRN        06/05/22 2217             Note:  This document was prepared using Dragon voice recognition software and may include unintentional dictation errors.    Melvenia Needles, PA-C 06/08/22 1737    Lucrezia Starch,  MD 06/09/22 1343

## 2022-06-05 NOTE — Discharge Instructions (Signed)
Your exam, labs, ultrasound, and CT scan all normal and reassuring.  No signs of any serious infection or organ damage.  No indication for your abdominal pain at this time.  You should follow-up with your primary provider for ongoing symptoms.  Return to the ED if needed.

## 2022-06-06 ENCOUNTER — Encounter: Payer: Self-pay | Admitting: Physician Assistant

## 2022-06-06 DIAGNOSIS — E559 Vitamin D deficiency, unspecified: Secondary | ICD-10-CM | POA: Diagnosis not present

## 2022-06-06 DIAGNOSIS — M0579 Rheumatoid arthritis with rheumatoid factor of multiple sites without organ or systems involvement: Secondary | ICD-10-CM | POA: Diagnosis not present

## 2022-06-06 MED ORDER — ONDANSETRON 4 MG PO TBDP: 4.0000 mg | ORAL_TABLET | Freq: Three times a day (TID) | ORAL | 0 refills | Status: DC | PRN

## 2022-06-06 MED ORDER — PANTOPRAZOLE SODIUM 40 MG PO TBEC
40.0000 mg | DELAYED_RELEASE_TABLET | Freq: Once | ORAL | Status: AC
  Administered 2022-06-06: 40 mg via ORAL
  Filled 2022-06-06: qty 1

## 2022-06-06 MED ORDER — PANTOPRAZOLE SODIUM 40 MG PO TBEC: 40.0000 mg | DELAYED_RELEASE_TABLET | Freq: Every day | ORAL | 2 refills | Status: DC

## 2022-06-11 ENCOUNTER — Ambulatory Visit (INDEPENDENT_AMBULATORY_CARE_PROVIDER_SITE_OTHER): Payer: Medicare Other | Admitting: Family Medicine

## 2022-06-11 ENCOUNTER — Encounter: Payer: Self-pay | Admitting: Family Medicine

## 2022-06-11 DIAGNOSIS — M7741 Metatarsalgia, right foot: Secondary | ICD-10-CM | POA: Diagnosis not present

## 2022-06-11 DIAGNOSIS — K629 Disease of anus and rectum, unspecified: Secondary | ICD-10-CM

## 2022-06-11 DIAGNOSIS — R1011 Right upper quadrant pain: Secondary | ICD-10-CM | POA: Diagnosis not present

## 2022-06-11 DIAGNOSIS — I10 Essential (primary) hypertension: Secondary | ICD-10-CM | POA: Diagnosis not present

## 2022-06-11 DIAGNOSIS — M7742 Metatarsalgia, left foot: Secondary | ICD-10-CM | POA: Diagnosis not present

## 2022-06-11 NOTE — Assessment & Plan Note (Signed)
Negative work-up in the ED.  She is status post cholecystectomy.  I suspect this is abdominal wall musculature pain possibly related to a strain.  Discussed this may take quite some time to completely improve.  I did discuss pain medication and though the patient has declined this.  Discussed she could take the occasional Aleve over-the-counter though should take this with food.  She will continue the Protonix for now.  She will mention this pain to GI when she sees them next week to see if they think she might need an EGD.

## 2022-06-11 NOTE — Patient Instructions (Signed)
Nice to see you. Please see the GI doctor as planned.

## 2022-06-11 NOTE — Assessment & Plan Note (Signed)
Much improved with injection from orthopedics in [facility].  She will monitor for any recurrence.

## 2022-06-11 NOTE — Assessment & Plan Note (Signed)
Persistent on recent CT imaging.  She sees GI next week for evaluation.

## 2022-06-11 NOTE — Progress Notes (Signed)
[name], MD Phone: [phone]  Cindy Stein is a 66 y.o. female who presents today for follow-up.  Hypertension: Patient reports her blood pressure is similar to today at home.  She is taking olmesartan/HCTZ and carvedilol.  No chest pain, shortness of breath, or edema.  Right upper quadrant pain: Patient notes she went to the emergency department for this.  She had sudden onset significant right upper quadrant pain that would come and go.  It was worsened by movements of her abdominal wall musculature.  She had an extensive work-up in the ED that was negative for a cause.  They did place her on Protonix though she does not think that has been helpful.  She has had some nausea with the pain.  She had no vomiting, diarrhea, hematuria, dysuria, vaginal discharge, or vaginal bleeding.  Her CT scan in the emergency department did reveal the known rectal abnormality and she sees GI next week for this.  She continues to intermittently have the discomfort and it typically only occurs if she is moving her abdominal wall muscles.  If she is standing or sitting still it does not bother her.  She occasionally takes Aleve for this.  She does not take the hydrocodone that was prescribed.  She notes she has not tolerated tramadol in the past.  Foot pain: Patient notes she saw sports medicine in [place] and they were not terribly helpful.  She went to see a provider in [place] and was diagnosed with a Morton's neuroma bilaterally as well as tenosynovitis and metatarsalgia.  She notes they gave her an injection and that helped her pain significantly.  Social History   Tobacco Use  Smoking Status Never  Smokeless Tobacco Never    Current Outpatient Medications on File Prior to Visit  Medication Sig Dispense Refill   albuterol (VENTOLIN HFA) 108 (90 Base) MCG/ACT inhaler Inhale 2 puffs into the lungs every 6 (six) hours as needed for wheezing or shortness of breath. 8 g 2   aspirin EC 81 MG  tablet Take 81 mg by mouth in the morning. Swallow whole.     carvedilol (COREG) 6.25 MG tablet TAKE 1 TABLET BY MOUTH TWICE A DAY 180 tablet 1   etanercept (ENBREL) 50 MG/ML injection Inject into the skin.     folic acid (FOLVITE) 1 MG tablet Take 1 mg by mouth in the morning.     methotrexate (RHEUMATREX) 2.5 MG tablet Take 15 mg by mouth every Sunday.     naproxen sodium (ALEVE) 220 MG tablet Take 220 mg by mouth daily as needed (pain.).     olmesartan-hydrochlorothiazide (BENICAR HCT) 20-12.5 MG tablet TAKE 1 TABLET BY MOUTH ONCE A DAY 90 tablet 1   ondansetron (ZOFRAN-ODT) 4 MG disintegrating tablet Take 1 tablet (4 mg total) by mouth every 8 (eight) hours as needed for nausea or vomiting. 15 tablet 0   pantoprazole (PROTONIX) 40 MG tablet Take 1 tablet (40 mg total) by mouth daily. 30 tablet 2   phenazopyridine (PYRIDIUM) 200 MG tablet Take 1 tablet (200 mg total) by mouth 3 (three) times daily as needed for pain (for burning). 10 tablet 0   sertraline (ZOLOFT) 50 MG tablet TAKE 1 TABLET BY MOUTH ONCE DAILY 30 tablet 0   No current facility-administered medications on file prior to visit.     ROS see history of present illness  Objective  Physical Exam Vitals:   06/11/22 0824  BP: 120/80  Pulse: 64  Temp: 98.3 F (36.8 C)  SpO2: 99%    BP Readings from Last 3 Encounters:  06/11/22 120/80  06/06/22 (!) 141/85  04/03/22 124/69   Wt Readings from Last 3 Encounters:  06/11/22 198 lb (89.8 kg)  06/05/22 191 lb 12.8 oz (87 kg)  04/03/22 202 lb (91.6 kg)    Physical Exam Constitutional:      General: She is not in acute distress.    Appearance: She is not diaphoretic.  Cardiovascular:     Rate and Rhythm: Normal rate and regular rhythm.     Heart sounds: Normal heart sounds.  Pulmonary:     Effort: Pulmonary effort is normal.     Breath sounds: Normal breath sounds.  Abdominal:     General: Bowel sounds are normal. There is no distension.     Palpations: Abdomen is  soft.     Tenderness: There is no guarding or rebound.     Comments: Patient has some right upper quadrant tenderness on palpation, this worsens when she tenses her abdominal wall muscles in the area is palpated  Skin:    General: Skin is warm and dry.  Neurological:     Mental Status: She is alert.      Assessment/Plan: Please see individual problem list.  Problem List Items Addressed This Visit     Hypertension (Chronic)    Well-controlled.  She will continue olmesartan-HCTZ 20-12.5 mg once daily and carvedilol 6.25 mg twice daily.      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  She sees GI next week for evaluation.      Metatarsalgia of both feet    Much improved with injection from orthopedics in [facility].  She will monitor for any recurrence.      Right upper quadrant pain    Negative work-up in the ED.  She is status post cholecystectomy.  I suspect this is abdominal wall musculature pain possibly related to a strain.  Discussed this may take quite some time to completely improve.  I did discuss pain medication and though the patient has declined this.  Discussed she could take the occasional Aleve over-the-counter though should take this with food.  She will continue the Protonix for now.  She will mention this pain to GI when she sees them next week to see if they think she might need an EGD.       Return in about 3 months (around 09/11/2022).   [name], MD [place]

## 2022-06-11 NOTE — Assessment & Plan Note (Signed)
Well-controlled.  She will continue olmesartan-HCTZ 20-12.5 mg once daily and carvedilol 6.25 mg twice daily.

## 2022-06-17 ENCOUNTER — Ambulatory Visit: Payer: Medicare Other | Admitting: Gastroenterology

## 2022-06-17 ENCOUNTER — Other Ambulatory Visit: Payer: Self-pay

## 2022-06-17 ENCOUNTER — Encounter: Payer: Self-pay | Admitting: Gastroenterology

## 2022-06-17 VITALS — BP 109/69 | HR 63 | Temp 98.9°F | Ht 63.0 in | Wt 199.0 lb

## 2022-06-17 DIAGNOSIS — R935 Abnormal findings on diagnostic imaging of other abdominal regions, including retroperitoneum: Secondary | ICD-10-CM

## 2022-06-17 NOTE — Progress Notes (Signed)
Jonathon Bellows MD, MRCP(U.K) [address]  [place]  [place], [place] [address]  Main: [phone]  Fax: [phone]   Gastroenterology Consultation  Referring Provider:     Leone Haven, MD Primary Care Physician:  Leone Haven, MD Primary Gastroenterologist:  Dr. Jonathon Bellows  Reason for Consultation:    Abnormal Ct findings        HPI:   Cindy Stein is a 66 y.o. y/o female referred for consultation & management  by Dr. [name], [name], MD.    S/p cholecystectomy.  Recently was seen in the emergency room for right-sided abdominal pain which began on that day.  Localized nonradiating.  Still has pain when someone presses on her right lower ribs.  No lower GI symptoms no change in bowel habits has had a colonoscopy in [place] a few years back was told it was normal.  Incidental finding of abnormality in the rectal wall. No clear symptoms of reflux but she has a hiatal hernia on her recent CAT scan.  06/05/2022: HB 14 grams 06/05/2022: CT abdomen : rectal wall thickening    Past Medical History:  Diagnosis Date   Anxiety    Atypical chest pain    DOE (dyspnea on exertion)    DVT of lower extremity (deep venous thrombosis) ([facility]) 03/20/2020   a.) LEFT femoral, popliteal, posterior tibial, and paroneal veins; Tx'd with Rivaroxaban; b.) embolized on 03/21/2020 requiring PA thrombolysis and mechanical thrombectomy on 03/22/2020   Grade I diastolic dysfunction 37/90/2409   History of immunosuppression    long-term MTX for RA diagnosis   History of PSVT (paroxysmal supraventricular tachycardia) 07/22/2019   a.) brief 5 beat run noted on Zio   HLD (hyperlipidemia)    Hypertension    Kidney [place]    [place]   Osteoarthritis    Plantar fascial fibromatosis    Pulmonary embolus ([place]) 03/21/2020   a.) BILATERAL; required PA thrombolysis and mechanical thrombectomy on 03/22/2020; b.) IVC filter placed prior to knee surgery on 12/12/2020 and removed on  04/19/2021   PVC (premature ventricular contraction)    Rheumatoid arthritis (HCC)    Valgus deformity of great toes, bilateral    Vitamin D deficiency     Past Surgical History:  Procedure Laterality Date   ANTERIOR AND POSTERIOR REPAIR N/A 07/23/2021   Procedure: ANTERIOR (CYSTOCELE) AND POSTERIOR REPAIR (RECTOCELE);  Surgeon: Harlin Heys, MD;  Location: [facility];  Service: Gynecology;  Laterality: N/A;   CARDIAC CATHETERIZATION  2006   carpel tunnel Bilateral    CATARACT EXTRACTION, BILATERAL     CHOLECYSTECTOMY N/A 2006   IVC FILTER INSERTION N/A 12/11/2020   Procedure: IVC FILTER INSERTION;  Surgeon: Algernon Huxley, MD;  Location: [facility];  Service: Cardiovascular;  Laterality: N/A;   IVC FILTER REMOVAL N/A 04/19/2021   Procedure: IVC FILTER REMOVAL;  Surgeon: Algernon Huxley, MD;  Location: [facility];  Service: Cardiovascular;  Laterality: N/A;   JOINT REPLACEMENT Right 2021   KNEE   LEFT HEART CATH AND CORONARY ANGIOGRAPHY Left 06/14/2019   Procedure: LEFT HEART CATH AND CORONARY ANGIOGRAPHY;  Surgeon: Wellington Hampshire, MD;  Location: [facility];  Service: Cardiovascular;  Laterality: Left;   PUBOVAGINAL SLING N/A 07/23/2021   Procedure: Gaynelle Arabian;  Surgeon: Harlin Heys, MD;  Location: [facility];  Service: Gynecology;  Laterality: N/A;   PULMONARY THROMBECTOMY Bilateral 03/22/2020   Procedure: PULMONARY THROMBECTOMY / THROMBOLYSIS;  Surgeon: Algernon Huxley, MD;  Location: [facility];  Service: Cardiovascular;  Laterality: Bilateral;   REPLACEMENT TOTAL KNEE Left 2022   VAGINAL HYSTERECTOMY N/A 07/23/2021   Procedure: HYSTERECTOMY VAGINAL;  Surgeon: Harlin Heys, MD;  Location: [facility];  Service: Gynecology;  Laterality: N/A;    Prior to Admission medications   Medication Sig Start Date End Date Taking? Authorizing Provider  albuterol (VENTOLIN HFA) 108 (90 Base) MCG/ACT inhaler Inhale 2 puffs into the lungs every  6 (six) hours as needed for wheezing or shortness of breath. 09/01/20   Parrett, Fonnie Mu, NP  aspirin EC 81 MG tablet Take 81 mg by mouth in the morning. Swallow whole.    [provider]  carvedilol (COREG) 6.25 MG tablet TAKE 1 TABLET BY MOUTH TWICE A DAY 01/30/22   Leone Haven, MD  etanercept (ENBREL) 50 MG/ML injection Inject into the skin.    [provider]  folic acid (FOLVITE) 1 MG tablet Take 1 mg by mouth in the morning. 05/10/21   [provider]  methotrexate (RHEUMATREX) 2.5 MG tablet Take 15 mg by mouth every Sunday. 05/10/21   [provider]  naproxen sodium (ALEVE) 220 MG tablet Take 220 mg by mouth daily as needed (pain.).    [provider]  olmesartan-hydrochlorothiazide (BENICAR HCT) 20-12.5 MG tablet TAKE 1 TABLET BY MOUTH ONCE A DAY 10/26/21   Leone Haven, MD  ondansetron (ZOFRAN-ODT) 4 MG disintegrating tablet Take 1 tablet (4 mg total) by mouth every 8 (eight) hours as needed for nausea or vomiting. 06/06/22   Menshew, Dannielle Karvonen, PA-C  pantoprazole (PROTONIX) 40 MG tablet Take 1 tablet (40 mg total) by mouth daily. 06/06/22 09/04/22  Menshew, Dannielle Karvonen, PA-C  phenazopyridine (PYRIDIUM) 200 MG tablet Take 1 tablet (200 mg total) by mouth 3 (three) times daily as needed for pain (for burning). 01/30/22   McLean-Scocuzza, Nino Glow, MD  sertraline (ZOLOFT) 50 MG tablet TAKE 1 TABLET BY MOUTH ONCE DAILY 06/05/22   Leone Haven, MD    Family History  Problem Relation Age of Onset   Asthma Mother    Asthma Father    Heart failure Father    Leukemia Sister    Breast cancer Neg Hx      Social History   Tobacco Use   Smoking status: Never   Smokeless tobacco: Never  Vaping Use   Vaping Use: Never used  Substance Use Topics   Alcohol use: Yes    Comment: OCCAS   Drug use: Never    Allergies as of 06/17/2022 - Review Complete 06/17/2022  Allergen Reaction Noted   Acetaminophen Other (See Comments) 04/12/2021    Tramadol Other (See Comments) 11/07/2020    Review of Systems:    All systems reviewed and negative except where noted in HPI.   Physical Exam:  BP 109/69 (BP Location: Right Arm, Patient Position: Sitting, Cuff Size: Large)   Pulse 63   Temp 98.9 F (37.2 C) (Oral)   Ht '5\' 3"'$  (1.6 m)   Wt 199 lb (90.3 kg)   BMI 35.25 kg/m  No LMP recorded. Patient has had a hysterectomy. Psych:  Alert and cooperative. Normal mood and affect. General:   Alert,  Well-developed, well-nourished, pleasant and cooperative in NAD Head:  Normocephalic and atraumatic. Eyes:  Sclera clear, no icterus.   Conjunctiva pink. Ears:  Normal auditory acuity. Neck:  Supple; no masses or thyromegaly. Abdomen: Tenderness over the right  lower ribs in the mid axillary line reproducible.  Normal bowel sounds.  No bruits.  Soft, non-tender and non-distended without masses, hepatosplenomegaly or hernias noted.  No guarding or rebound tenderness.    Neurologic:  Alert and oriented x3;  grossly normal neurologically. Psych:  Alert and cooperative. Normal mood and affect.  Imaging Studies: US Abdomen Limited RUQ (LIVER/GB)  Result Date: 06/05/2022 CLINICAL DATA:  History of prior cholecystectomy, presenting with right upper quadrant pain. EXAM: ULTRASOUND ABDOMEN LIMITED RIGHT UPPER QUADRANT COMPARISON:  None Available. FINDINGS: Gallbladder: The gallbladder is surgically absent. Common bile duct: Diameter: 3.4 mm Liver: No focal lesion identified. Diffusely increased echogenicity of the liver parenchyma is noted. Portal vein is patent on color Doppler imaging with normal direction of blood flow towards the liver. Other: None. IMPRESSION: 1. Findings consistent with history of prior cholecystectomy. 2. Hepatic steatosis without focal liver lesions. Electronically Signed   By: Virgina Norfolk M.D.   On: 06/05/2022 21:12   CT ABDOMEN PELVIS W CONTRAST  Result Date: 06/05/2022 CLINICAL DATA:  Abdominal pain, acute,  nonlocalized RUQ Patient reports right-sided back pain for 3 days. EXAM: CT ABDOMEN AND PELVIS WITH CONTRAST TECHNIQUE: Multidetector CT imaging of the abdomen and pelvis was performed using the standard protocol following bolus administration of intravenous contrast. RADIATION DOSE REDUCTION: This exam was performed according to the departmental dose-optimization program which includes automated exposure control, adjustment of the mA and/or kV according to patient size and/or use of iterative reconstruction technique. CONTRAST:  137m OMNIPAQUE IOHEXOL 300 MG/ML  SOLN COMPARISON:  Noncontrast CT 02/15/2022 FINDINGS: Lower chest: Minimal scarring in the medial right lower lobe related to thoracic spine osteophytes. No acute airspace disease or pleural effusion. Well-circumscribed fluid density structures adjacent to the right heart are stable from prior exam and likely represent pericardial cysts. Stable pulmonary nodules in the lung bases, including 4 mm right middle lobe nodule series 4, image 12 and 4 mm left lower lobe nodule series 4, image 13. Multiple additional smaller nodules are unchanged. Nodules are unchanged dating back to chest CT from July of 2021 and considered benign, needing no further follow-up. Hepatobiliary: Diffuse hepatic steatosis. No focal hepatic lesion. Clips in the gallbladder fossa postcholecystectomy. No biliary dilatation. Pancreas: No ductal dilatation or inflammation. Spleen: Normal in size without focal abnormality. Adrenals/Urinary Tract: Normal adrenal glands. No hydronephrosis. No renal calculi. There are stable bilateral renal cysts, needing no further follow-up. No solid renal lesion. No perinephric edema. The urinary bladder is nondistended and not well assessed. Probable cystocele. Stomach/Bowel: Small hiatal hernia. Decompressed stomach. No bowel obstruction or inflammation. Left colonic diverticulosis without diverticulitis. Normal appendix. Again seen suspected posterior  rectal wall thickening at 2.1 cm. Vascular/Lymphatic: Minimal aortic atherosclerosis. No aortic aneurysm. No bulky abdominopelvic adenopathy. Reproductive: Hysterectomy.  Quiescent ovaries.  No adnexal mass. Other: Tiny fat containing umbilical hernia. There are no acute or suspicious osseous abnormalities. Musculoskeletal: Stable T11 vertebral body hemangioma. Lower lumbar facet hypertrophy. There are no acute or suspicious osseous abnormalities. IMPRESSION: 1. No acute abnormality in the abdomen/pelvis. 2. Hepatic steatosis. 3. Colonic diverticulosis without diverticulitis. 4. Posterior rectal wall thickening, also seen on prior exam. Given persistence, recommend up-to-date colonoscopy/sigmoidoscopy to exclude colonic neoplasm. This should also be amenable to physical exam. 5. Small hiatal hernia. Electronically Signed   By: MKeith RakeM.D.   On: 06/05/2022 20:48   MM DIAG BREAST TOMO UNI RIGHT  Result Date: 05/21/2022 CLINICAL DATA:  Short-term interval follow-up of likely benign fat necrosis/oil cysts  involving the UPPER OUTER QUADRANT of the RIGHT breast. EXAM: DIGITAL DIAGNOSTIC UNILATERAL RIGHT MAMMOGRAM WITH TOMOSYNTHESIS AND CAD; ULTRASOUND RIGHT BREAST LIMITED TECHNIQUE: Right digital diagnostic mammography and breast tomosynthesis was performed. The images were evaluated with computer-aided detection.; Targeted ultrasound examination of the right breast was performed. COMPARISON:  Previous exam(s). ACR Breast Density Category a: The breast tissue is almost entirely fatty. FINDINGS: Full field CC and MLO views were obtained. The circumscribed mixed hyperdense and isodense asymmetry/mass in the UPPER OUTER QUADRANT at middle to posterior depth is unchanged, measuring approximately 0.8 cm. There are no new or suspicious findings elsewhere. Targeted ultrasound is performed, again demonstrating the adjacent superficial anechoic masses within subcutaneous fat lobules at the 10 o'clock position 6 cm  from the nipple, the more superficial of which measures approximately 0.4 x 0.3 x 0.3 cm (previously 0.4 x 0.3 x 0.4 cm on 11/20/2021) and the adjacent slightly deeper of which measures approximately 0.2 x 0.2 x 0.2 cm (previously 0.5 x 0.2 x 0.5 cm). However, I do not believe these superficial benign oil cysts correspond to the mammographic mass/asymmetry which is deeper in the breast. Incidental benign simple cyst at 10 o'clock 6 cm from the nipple which measures approximately 0.8 cm. There is a normal appearing intramammary lymph node at 10 o'clock 8 cm from the nipple measuring 1.1 cm in length which is stable over multiple prior mammograms. At real-time examination, there is no sonographic correlate for the stable mammographic asymmetry/mass. A benign oil cyst is noted at the 12 o'clock location at real-time examination. IMPRESSION: 1. Stable likely benign 0.8 cm asymmetry/mass in the UPPER OUTER QUADRANT of the RIGHT breast at middle depth without sonographic correlate. 2. Benign superficial oil cysts in the UPPER OUTER QUADRANT which are not the sonographic correlate for the mammographic asymmetry/mass. 3. Incidental benign 0.8 cm simple cyst in the UPPER OUTER QUADRANT adjacent to the benign superficial oil cysts, also not likely the sonographic correlate for the mammographic asymmetry/mass. RECOMMENDATION: Annual BILATERAL diagnostic mammography in 6 months. I have discussed the findings and recommendations with the patient. If applicable, a reminder letter will be sent to the patient regarding the next appointment. BI-RADS CATEGORY  3: Probably benign. Electronically Signed   By: Evangeline Dakin M.D.   On: 05/21/2022 16:09  US BREAST LTD UNI RIGHT INC AXILLA  Result Date: 05/21/2022 CLINICAL DATA:  Short-term interval follow-up of likely benign fat necrosis/oil cysts involving the UPPER OUTER QUADRANT of the RIGHT breast. EXAM: DIGITAL DIAGNOSTIC UNILATERAL RIGHT MAMMOGRAM WITH TOMOSYNTHESIS AND CAD;  ULTRASOUND RIGHT BREAST LIMITED TECHNIQUE: Right digital diagnostic mammography and breast tomosynthesis was performed. The images were evaluated with computer-aided detection.; Targeted ultrasound examination of the right breast was performed. COMPARISON:  Previous exam(s). ACR Breast Density Category a: The breast tissue is almost entirely fatty. FINDINGS: Full field CC and MLO views were obtained. The circumscribed mixed hyperdense and isodense asymmetry/mass in the UPPER OUTER QUADRANT at middle to posterior depth is unchanged, measuring approximately 0.8 cm. There are no new or suspicious findings elsewhere. Targeted ultrasound is performed, again demonstrating the adjacent superficial anechoic masses within subcutaneous fat lobules at the 10 o'clock position 6 cm from the nipple, the more superficial of which measures approximately 0.4 x 0.3 x 0.3 cm (previously 0.4 x 0.3 x 0.4 cm on 11/20/2021) and the adjacent slightly deeper of which measures approximately 0.2 x 0.2 x 0.2 cm (previously 0.5 x 0.2 x 0.5 cm). However, I do not believe these superficial benign  oil cysts correspond to the mammographic mass/asymmetry which is deeper in the breast. Incidental benign simple cyst at 10 o'clock 6 cm from the nipple which measures approximately 0.8 cm. There is a normal appearing intramammary lymph node at 10 o'clock 8 cm from the nipple measuring 1.1 cm in length which is stable over multiple prior mammograms. At real-time examination, there is no sonographic correlate for the stable mammographic asymmetry/mass. A benign oil cyst is noted at the 12 o'clock location at real-time examination. IMPRESSION: 1. Stable likely benign 0.8 cm asymmetry/mass in the UPPER OUTER QUADRANT of the RIGHT breast at middle depth without sonographic correlate. 2. Benign superficial oil cysts in the UPPER OUTER QUADRANT which are not the sonographic correlate for the mammographic asymmetry/mass. 3. Incidental benign 0.8 cm simple cyst  in the UPPER OUTER QUADRANT adjacent to the benign superficial oil cysts, also not likely the sonographic correlate for the mammographic asymmetry/mass. RECOMMENDATION: Annual BILATERAL diagnostic mammography in 6 months. I have discussed the findings and recommendations with the patient. If applicable, a reminder letter will be sent to the patient regarding the next appointment. BI-RADS CATEGORY  3: Probably benign. Electronically Signed   By: Evangeline Dakin M.D.   On: 05/21/2022 16:09   Assessment and Plan:   Cindy Stein is a 66 y.o. y/o female has been referred for an incidental finding of rectal wall thickening on a CAT scan when she presented to the emergency room recently with right-sided abdominal pain.  She has had a colonoscopy about 5 years back in [place] but does not have any change in bowel habits or lower GI symptoms.  In terms of her right-sided abdominal pain she is tender over her right lower rib in the mid axillary line particularly when she flexes towards her right side.  This is a musculoskeletal pain.  Plan 1.  Colonoscopy 2.  For musculoskeletal pain she would likely require physical therapy and if no better consider imaging down the road with her primary care physician  I have discussed alternative options, risks & benefits,  which include, but are not limited to, bleeding, infection, perforation,respiratory complication & drug reaction.  The patient agrees with this plan & written consent will be obtained.     Follow up in  as needed  Dr Jonathon Bellows MD,MRCP(U.K)

## 2022-06-18 ENCOUNTER — Encounter: Payer: Self-pay | Admitting: Obstetrics and Gynecology

## 2022-06-18 ENCOUNTER — Ambulatory Visit (INDEPENDENT_AMBULATORY_CARE_PROVIDER_SITE_OTHER): Payer: Medicare Other | Admitting: Obstetrics and Gynecology

## 2022-06-18 VITALS — BP 131/81 | HR 60 | Ht 63.0 in | Wt 200.1 lb

## 2022-06-18 DIAGNOSIS — Z01419 Encounter for gynecological examination (general) (routine) without abnormal findings: Secondary | ICD-10-CM | POA: Diagnosis not present

## 2022-06-18 NOTE — Progress Notes (Signed)
Patients presents for annual exam today. She states no trouble with bleeding or other issues since hysterectomy. Patient is up to date on mammogram. Patients annual labs are ordered. Patient states no other questions or concerns at this time.

## 2022-06-18 NOTE — Progress Notes (Signed)
HPI:      [name] is a 66 y.o. G1P1001 who LMP was No LMP recorded. Patient has had a hysterectomy.  Subjective:   She presents today for annual examination.  She reports that she is generally doing well gynecologically.  She states that she has a colonoscopy later this week.  She sees Dr. [name] for general medical care, she sees a rheumatologist for her arthritis.  She sees a vascular doctor. Upon further questioning she says she is not having any pelvic problems.  After exam I mentioned that her vaginal cuff/cystocele was significantly worse and she reports that she has noticed this but is not having an issue with it. Also states she has a rash under her breast that has not itching but she thinks it is from the heat. Reports that she has begun Ozempic to try to lose weight.    Hx: The following portions of the patient's history were reviewed and updated as appropriate:             She  has a past medical history of Anxiety, Atypical chest pain, DOE (dyspnea on exertion), DVT of lower extremity (deep venous thrombosis) ([place]) (03/20/2020), Grade I diastolic dysfunction (22/29/7989), History of immunosuppression, History of PSVT (paroxysmal supraventricular tachycardia) (07/22/2019), HLD (hyperlipidemia), Hypertension, Kidney stone, Osteoarthritis, Plantar fascial fibromatosis, Pulmonary embolus ([place]) (03/21/2020), PVC (premature ventricular contraction), Rheumatoid arthritis ([place]), Valgus deformity of great toes, bilateral, and Vitamin D deficiency. She does not have any pertinent problems on file. She  has a past surgical history that includes Cholecystectomy (N/A, 2006); carpel tunnel (Bilateral); Cardiac catheterization (2006); LEFT HEART CATH AND CORONARY ANGIOGRAPHY (Left, 06/14/2019); PULMONARY THROMBECTOMY (Bilateral, 03/22/2020); IVC FILTER INSERTION (N/A, 12/11/2020); IVC FILTER REMOVAL (N/A, 04/19/2021); Cataract extraction, bilateral; Joint replacement (Right, 2021);  Replacement total knee (Left, 2022); Vaginal hysterectomy (N/A, 07/23/2021); Anterior and posterior repair (N/A, 07/23/2021); and Pubovaginal sling (N/A, 07/23/2021). Her family history includes Asthma in her father and mother; Heart failure in her father; Leukemia in her sister. She  reports that she has never smoked. She has never used smokeless tobacco. She reports that she does not currently use alcohol. She reports that she does not use drugs. She has a current medication list which includes the following prescription(s): aspirin ec, carvedilol, etanercept, naproxen sodium, olmesartan-hydrochlorothiazide, ondansetron, pantoprazole, and sertraline. She is allergic to acetaminophen and tramadol.       Review of Systems:  Review of Systems  Constitutional: Denied constitutional symptoms, night sweats, recent illness, fatigue, fever, insomnia and weight loss.  Eyes: Denied eye symptoms, eye pain, photophobia, vision change and visual disturbance.  Ears/Nose/Throat/Neck: Denied ear, nose, throat or neck symptoms, hearing loss, nasal discharge, sinus congestion and sore throat.  Cardiovascular: Denied cardiovascular symptoms, arrhythmia, chest pain/pressure, edema, exercise intolerance, orthopnea and palpitations.  Respiratory: Denied pulmonary symptoms, asthma, pleuritic pain, productive sputum, cough, dyspnea and wheezing.  Gastrointestinal: Denied, gastro-esophageal reflux, melena, nausea and vomiting.  Genitourinary: See HPI for additional information.  Musculoskeletal: Denied musculoskeletal symptoms, stiffness, swelling, muscle weakness and myalgia.  Dermatologic: Denied dermatology symptoms, rash and scar.  Neurologic: Denied neurology symptoms, dizziness, headache, neck pain and syncope.  Psychiatric: Denied psychiatric symptoms, anxiety and depression.  Endocrine: Denied endocrine symptoms including hot flashes and night sweats.   Meds:   Current Outpatient Medications on File Prior to  Visit  Medication Sig Dispense Refill   aspirin EC 81 MG tablet Take 81 mg by mouth in the morning. Swallow whole.     carvedilol (COREG) 6.25  MG tablet TAKE 1 TABLET BY MOUTH TWICE A DAY 180 tablet 1   etanercept (ENBREL) 50 MG/ML injection Inject into the skin.     naproxen sodium (ALEVE) 220 MG tablet Take 220 mg by mouth daily as needed (pain.).     olmesartan-hydrochlorothiazide (BENICAR HCT) 20-12.5 MG tablet TAKE 1 TABLET BY MOUTH ONCE A DAY 90 tablet 1   ondansetron (ZOFRAN-ODT) 4 MG disintegrating tablet Take 1 tablet (4 mg total) by mouth every 8 (eight) hours as needed for nausea or vomiting. 15 tablet 0   pantoprazole (PROTONIX) 40 MG tablet Take 1 tablet (40 mg total) by mouth daily. 30 tablet 2   sertraline (ZOLOFT) 50 MG tablet TAKE 1 TABLET BY MOUTH ONCE DAILY 30 tablet 0   No current facility-administered medications on file prior to visit.      Objective:     Vitals:   06/18/22 0909  BP: 131/81  Pulse: 60   Filed Weights   06/18/22 0909  Weight: 200 lb 1.6 oz (90.8 kg)              Physical examination General NAD, Conversant  HEENT Atraumatic; Op clear with mmm.  Normo-cephalic. Pupils reactive. Anicteric sclerae  Thyroid/Neck Smooth without nodularity or enlargement. Normal ROM.  Neck Supple.  Skin No rashes, lesions or ulceration. Normal palpated skin turgor. No nodularity.  Breasts: No masses or discharge.  Symmetric.  No axillary adenopathy.  Lungs: Clear to auscultation.No rales or wheezes. Normal Respiratory effort, no retractions.  Heart: NSR.  No murmurs or rubs appreciated. No periferal edema  Abdomen: Soft.  Non-tender.  No masses.  No HSM. No hernia  Extremities: Moves all appropriately.  Normal ROM for age. No lymphadenopathy.  Neuro: Oriented to PPT.  Normal mood. Normal affect.     Pelvic:   Vulva: Normal appearance.  No lesions.   Vagina: No lesions or abnormalities noted.  Support: Vaginal cuff descensus with cystocele.  Urethra No  masses tenderness or scarring.  Meatus Normal size without lesions or prolapse.  Cervix: Surgically absent   Anus: Normal exam.  No lesions.  Perineum: Normal exam.  No lesions.        Bimanual   Uterus: Surgically absent   Adnexae: No masses.  Non-tender to palpation.  Cul-de-sac: Negative for abnormality.             Assessment:    G1P1001 Patient Active Problem List   Diagnosis Date Noted   Right upper quadrant pain 06/11/2022   Morton's neuroma of left foot 05/28/2022   Hallux valgus 04/30/2022   Achilles tendinitis 04/30/2022   Synovitis of ankle 04/30/2022   Tenosynovitis of left foot 04/30/2022   Pain in finger of left hand 03/30/2022   Rectal abnormality on CT 03/12/2022   Metatarsalgia of both feet 03/12/2022   Rheumatoid arthritis ([place]) 01/09/2022   Breast cyst 01/09/2022   Anxiety and depression 08/08/2021   Chest pain 08/01/2021   Prediabetes 07/09/2021   S/P IVC filter 04/03/2021   Proteinuria 01/26/2021   Gross hematuria 01/26/2021   History of cholecystectomy 10/24/2020   History of decompression of median nerve 10/24/2020   History of total knee arthroplasty 10/24/2020   Osteoarthritis of left knee 10/24/2020   Obesity 08/29/2020   Palpitations 06/27/2020   Pulmonary nodules 03/28/2020   History of pulmonary embolus (PE) 03/21/2020   History of DVT (deep vein thrombosis) 03/21/2020   Hypertension    PVC (premature ventricular contraction)      1. Well  woman exam with routine gynecological exam     She has a worsening of her cuff prolapse and cystocele since her last visit.  At this time she is asymptomatic  Cutaneous monilia infection under both breasts.   Plan:            1.  Basic Screening Recommendations The basic screening recommendations for asymptomatic women were discussed with the patient during her visit.  The age-appropriate recommendations were discussed with her and the rational for the tests reviewed.  When I am informed by the  patient that another primary care physician has previously obtained the age-appropriate tests and they are up-to-date, only outstanding tests are ordered and referrals given as necessary.  Abnormal results of tests will be discussed with her when all of her results are completed.  Routine preventative health maintenance measures emphasized: Exercise/Diet/Weight control, Tobacco Warnings, Alcohol/Substance use risks and Stress Management  Blood work today  2.  Advised use of antifungal cream under her breasts  3.  She will inform us if she becomes symptomatic with her prolapse.  We briefly discussed use of pessary versus surgery. ?  [place] ? Orders Orders Placed This Encounter  Procedures   Basic metabolic panel   CBC   TSH   Lipid panel   Hemoglobin A1c    No orders of the defined types were placed in this encounter.     F/U  Return in about 1 year (around 06/19/2023) for Annual Physical.  Finis Bud, M.D. 06/18/2022 9:38 AM

## 2022-06-19 LAB — CBC
Hematocrit: 40.5 % (ref 34.0–46.6)
Hemoglobin: 13.6 g/dL (ref 11.1–15.9)
MCH: 29.6 pg (ref 26.6–33.0)
MCHC: 33.6 g/dL (ref 31.5–35.7)
MCV: 88 fL (ref 79–97)
Platelets: 184 10*3/uL (ref 150–450)
RBC: 4.6 x10E6/uL (ref 3.77–5.28)
RDW: 13 % (ref 11.7–15.4)
WBC: 5.9 10*3/uL (ref 3.4–10.8)

## 2022-06-19 LAB — BASIC METABOLIC PANEL
BUN/Creatinine Ratio: 24 (ref 12–28)
BUN: 19 mg/dL (ref 8–27)
CO2: 25 mmol/L (ref 20–29)
Calcium: 9.1 mg/dL (ref 8.7–10.3)
Chloride: 103 mmol/L (ref 96–106)
Creatinine, Ser: 0.78 mg/dL (ref 0.57–1.00)
Glucose: 98 mg/dL (ref 70–99)
Potassium: 4 mmol/L (ref 3.5–5.2)
Sodium: 141 mmol/L (ref 134–144)
eGFR: 83 mL/min/{1.73_m2} (ref >59)

## 2022-06-19 LAB — LIPID PANEL
Chol/HDL Ratio: 2.4 ratio (ref 0.0–4.4)
Cholesterol, Total: 169 mg/dL (ref 100–199)
HDL: 70 mg/dL (ref >39)
LDL Chol Calc (NIH): 84 mg/dL (ref 0–99)
Triglycerides: 80 mg/dL (ref 0–149)
VLDL Cholesterol Cal: 15 mg/dL (ref 5–40)

## 2022-06-19 LAB — HEMOGLOBIN A1C
Est. average glucose Bld gHb Est-mCnc: 128 mg/dL
Hgb A1c MFr Bld: 6.1 % — ABNORMAL HIGH (ref 4.8–5.6)

## 2022-06-19 LAB — TSH: TSH: 2.77 u[IU]/mL (ref 0.450–4.500)

## 2022-06-20 ENCOUNTER — Ambulatory Visit: Payer: Medicare Other | Admitting: Anesthesiology

## 2022-06-20 ENCOUNTER — Other Ambulatory Visit: Payer: Self-pay

## 2022-06-20 ENCOUNTER — Ambulatory Visit
Admission: RE | Admit: 2022-06-20 | Discharge: 2022-06-20 | Disposition: A | Discharge status: Home / Self Care | Payer: Medicare Other | Attending: Gastroenterology | Admitting: Gastroenterology

## 2022-06-20 ENCOUNTER — Encounter
Admission: RE | Disposition: A | Discharge status: Home / Self Care | Payer: Self-pay | Source: Home / Self Care | Attending: Gastroenterology

## 2022-06-20 ENCOUNTER — Encounter: Payer: Self-pay | Admitting: Gastroenterology

## 2022-06-20 DIAGNOSIS — F32A Depression, unspecified: Secondary | ICD-10-CM | POA: Diagnosis not present

## 2022-06-20 DIAGNOSIS — Z6835 Body mass index (BMI) 35.0-35.9, adult: Secondary | ICD-10-CM | POA: Insufficient documentation

## 2022-06-20 DIAGNOSIS — R933 Abnormal findings on diagnostic imaging of other parts of digestive tract: Secondary | ICD-10-CM | POA: Diagnosis not present

## 2022-06-20 DIAGNOSIS — D126 Benign neoplasm of colon, unspecified: Secondary | ICD-10-CM | POA: Diagnosis not present

## 2022-06-20 DIAGNOSIS — K635 Polyp of colon: Secondary | ICD-10-CM | POA: Diagnosis not present

## 2022-06-20 DIAGNOSIS — E669 Obesity, unspecified: Secondary | ICD-10-CM | POA: Diagnosis not present

## 2022-06-20 DIAGNOSIS — R935 Abnormal findings on diagnostic imaging of other abdominal regions, including retroperitoneum: Secondary | ICD-10-CM | POA: Diagnosis not present

## 2022-06-20 DIAGNOSIS — Z86718 Personal history of other venous thrombosis and embolism: Secondary | ICD-10-CM | POA: Insufficient documentation

## 2022-06-20 DIAGNOSIS — F419 Anxiety disorder, unspecified: Secondary | ICD-10-CM | POA: Insufficient documentation

## 2022-06-20 DIAGNOSIS — K648 Other hemorrhoids: Secondary | ICD-10-CM | POA: Diagnosis not present

## 2022-06-20 DIAGNOSIS — Z86711 Personal history of pulmonary embolism: Secondary | ICD-10-CM | POA: Insufficient documentation

## 2022-06-20 DIAGNOSIS — D122 Benign neoplasm of ascending colon: Secondary | ICD-10-CM | POA: Insufficient documentation

## 2022-06-20 DIAGNOSIS — I10 Essential (primary) hypertension: Secondary | ICD-10-CM | POA: Diagnosis not present

## 2022-06-20 HISTORY — PX: COLONOSCOPY WITH PROPOFOL: SHX5780

## 2022-06-20 SURGERY — COLONOSCOPY WITH PROPOFOL
Anesthesia: General

## 2022-06-20 MED ORDER — SODIUM CHLORIDE 0.9 % IV SOLN: INTRAVENOUS | Status: DC

## 2022-06-20 MED ORDER — PROPOFOL 10 MG/ML IV BOLUS
INTRAVENOUS | Status: DC | PRN
  Administered 2022-06-20 (×2): 40 mg via INTRAVENOUS
  Administered 2022-06-20: 100 mg via INTRAVENOUS
  Administered 2022-06-20 (×2): 40 mg via INTRAVENOUS

## 2022-06-20 NOTE — H&P (Signed)
Jonathon Bellows, MD [address] [place], [place], [place], [address] [address], [place], [place], [place], [address] Phone: [phone]  Fax: [phone]  Primary Care Physician:  Leone Haven, MD   Pre-Procedure History & Physical: HPI:  Cindy Stein is a 66 y.o. female is here for an colonoscopy.   Past Medical History:  Diagnosis Date   Anxiety    Atypical chest pain    DOE (dyspnea on exertion)    DVT of lower extremity (deep venous thrombosis) ([place]) 03/20/2020   a.) LEFT femoral, popliteal, posterior tibial, and paroneal veins; Tx'd with Rivaroxaban; b.) embolized on 03/21/2020 requiring PA thrombolysis and mechanical thrombectomy on 03/22/2020   Grade I diastolic dysfunction 57/84/6962   History of immunosuppression    long-term MTX for RA diagnosis   History of PSVT (paroxysmal supraventricular tachycardia) 07/22/2019   a.) brief 5 beat run noted on [facility]   HLD (hyperlipidemia)    Hypertension    Kidney [place]    [place]   Osteoarthritis    Plantar fascial fibromatosis    Pulmonary embolus ([place]) 03/21/2020   a.) BILATERAL; required PA thrombolysis and mechanical thrombectomy on 03/22/2020; b.) IVC filter placed prior to knee surgery on 12/12/2020 and removed on 04/19/2021   PVC (premature ventricular contraction)    Rheumatoid arthritis ([facility])    Valgus deformity of great toes, bilateral    Vitamin D deficiency     Past Surgical History:  Procedure Laterality Date   ANTERIOR AND POSTERIOR REPAIR N/A 07/23/2021   Procedure: ANTERIOR (CYSTOCELE) AND POSTERIOR REPAIR (RECTOCELE);  Surgeon: Harlin Heys, MD;  Location: [facility];  Service: Gynecology;  Laterality: N/A;   CARDIAC CATHETERIZATION  2006   carpel tunnel Bilateral    CATARACT EXTRACTION, BILATERAL     CHOLECYSTECTOMY N/A 2006   IVC FILTER INSERTION N/A 12/11/2020   Procedure: IVC FILTER INSERTION;  Surgeon: Algernon Huxley, MD;  Location: [facility];  Service:  Cardiovascular;  Laterality: N/A;   IVC FILTER REMOVAL N/A 04/19/2021   Procedure: IVC FILTER REMOVAL;  Surgeon: Algernon Huxley, MD;  Location: [facility];  Service: Cardiovascular;  Laterality: N/A;   JOINT REPLACEMENT Right 2021   KNEE   LEFT HEART CATH AND CORONARY ANGIOGRAPHY Left 06/14/2019   Procedure: LEFT HEART CATH AND CORONARY ANGIOGRAPHY;  Surgeon: Wellington Hampshire, MD;  Location: [facility];  Service: Cardiovascular;  Laterality: Left;   PUBOVAGINAL SLING N/A 07/23/2021   Procedure: Gaynelle Arabian;  Surgeon: Harlin Heys, MD;  Location: [facility];  Service: Gynecology;  Laterality: N/A;   PULMONARY THROMBECTOMY Bilateral 03/22/2020   Procedure: PULMONARY THROMBECTOMY / THROMBOLYSIS;  Surgeon: Algernon Huxley, MD;  Location: [facility];  Service: Cardiovascular;  Laterality: Bilateral;   REPLACEMENT TOTAL KNEE Left 2022   VAGINAL HYSTERECTOMY N/A 07/23/2021   Procedure: HYSTERECTOMY VAGINAL;  Surgeon: Harlin Heys, MD;  Location: [facility];  Service: Gynecology;  Laterality: N/A;    Prior to Admission medications   Medication Sig Start Date End Date Taking? Authorizing Provider  aspirin EC 81 MG tablet Take 81 mg by mouth in the morning. Swallow whole.   Yes [provider]  carvedilol (COREG) 6.25 MG tablet TAKE 1 TABLET BY MOUTH TWICE A DAY 01/30/22  Yes Leone Haven, MD  olmesartan-hydrochlorothiazide (BENICAR HCT) 20-12.5 MG tablet TAKE 1 TABLET BY MOUTH ONCE A DAY 10/26/21  Yes Leone Haven, MD  pantoprazole (  PROTONIX) 40 MG tablet Take 1 tablet (40 mg total) by mouth daily. 06/06/22 09/04/22 Yes Menshew, Dannielle Karvonen, PA-C  sertraline (ZOLOFT) 50 MG tablet TAKE 1 TABLET BY MOUTH ONCE DAILY 06/05/22  Yes Leone Haven, MD  etanercept (ENBREL) 50 MG/ML injection Inject into the skin.    [provider]  naproxen sodium (ALEVE) 220 MG tablet Take 220 mg by mouth daily as needed (pain.).    [provider]  ondansetron (ZOFRAN-ODT) 4 MG disintegrating tablet Take 1 tablet (4 mg total) by mouth every 8 (eight) hours as needed for nausea or vomiting. 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  Tobacco Use   Smoking status: Never   Smokeless tobacco: Never  Vaping Use   Vaping Use: Never used  Substance and Sexual Activity   Alcohol use: Not Currently    Comment: OCCAS   Drug use: Never   Sexual activity: Not Currently    Birth control/protection: Surgical    Comment: hysterectomy  Other Topics Concern   Not on file  Social History Narrative   Lives at home with spouse   Social Determinants of Health   Financial Resource Strain: Low Risk  (08/28/2021)   Overall Financial Resource Strain (CARDIA)    Difficulty of Paying Living Expenses: Not hard at all  Food Insecurity: No Food Insecurity (06/15/2021)   Hunger Vital Sign    Worried About Running Out of Food in the Last Year: Never true    [place] in the Last Year: Never true  Transportation Needs: No Transportation Needs (06/15/2021)   PRAPARE - Hydrologist (Medical): No    Lack of Transportation (Non-Medical): No  Physical Activity: Sufficiently Active (06/15/2021)   Exercise Vital Sign    Days of Exercise per Week: 5 days    Minutes of Exercise per Session: 30 min  Stress: No Stress Concern Present  (06/15/2021)   [place]    Feeling of Stress : Not at all  Social Connections: Unknown (06/15/2021)   Social Connection and Isolation Panel [NHANES]    Frequency of Communication with Friends and Family: More than three times a week    Frequency of Social Gatherings with Friends and Family: More than three times a week    Attends Religious Services: Not on file    Active Member of Clubs or Organizations: Not on file    Attends Archivist Meetings: Not on file    Marital Status: Married  Intimate Partner Violence: Not At Risk (06/15/2021)   Humiliation, Afraid, Rape, and Kick questionnaire    Fear of Current or Ex-Partner: No    Emotionally Abused: No    Physically Abused: No  Sexually Abused: No    Review of Systems: See HPI, otherwise negative ROS  Physical Exam: BP 113/76   Pulse 65   Temp (!) 97.1 F (36.2 C) (Temporal)   Resp 18   Ht '5\' 3"'$  (1.6 m)   Wt 89.8 kg   SpO2 100%   BMI 35.07 kg/m  General:   Alert,  pleasant and cooperative in NAD Head:  Normocephalic and atraumatic. Neck:  Supple; no masses or thyromegaly. Lungs:  Clear throughout to auscultation, normal respiratory effort.    Heart:  +S1, +S2, Regular rate and rhythm, No edema. Abdomen:  Soft, nontender and nondistended. Normal bowel sounds, without guarding, and without rebound.   Neurologic:  Alert and  oriented x4;  grossly normal neurologically.  Impression/Plan: Itzelle Gains is here for an colonoscopy to be performed for abnormal ct scan findings Risks, benefits, limitations, and alternatives regarding  colonoscopy have been reviewed with the patient.  Questions have been answered.  All parties agreeable.   Jonathon Bellows, MD  06/20/2022, 10:36 AM

## 2022-06-20 NOTE — Anesthesia Preprocedure Evaluation (Addendum)
Anesthesia Evaluation  Patient identified by MRN, date of birth, ID band Patient awake    Reviewed: Allergy & Precautions, NPO status , Patient's Chart, lab work & pertinent test results  History of Anesthesia Complications Negative for: history of anesthetic complications  Airway Mallampati: IV   Neck ROM: Full    Dental  (+) Upper Dentures   Pulmonary neg pulmonary ROS,    Pulmonary exam normal breath sounds clear to auscultation       Cardiovascular hypertension, Normal cardiovascular exam Rhythm:Regular Rate:Normal  Hx DVT/PE  ECG 02/12/22: sinus rhythm with first-degree AV block.  Heart rate is 62 bpm.  Myocardial perfusion 08/02/21:  Marland Kitchen  The study is normal. The study is low risk. .  No ST deviation was noted. .  Left ventricular function is normal. .  Prior study not available for comparison.   Neuro/Psych PSYCHIATRIC DISORDERS Anxiety Depression negative neurological ROS     GI/Hepatic negative GI ROS,   Endo/Other  Obesity; prediabetes  Renal/GU Renal disease (nephrolithiasis)     Musculoskeletal  (+) Arthritis , Rheumatoid disorders,    Abdominal   Peds  Hematology negative hematology ROS (+)   Anesthesia Other Findings Cardiology note 02/12/22:  1.  Chest pain: Negative cardiac work-up in the past including cardiac catheterization in 2020 which showed normal coronary arteries.  No recurrent chest pain since her anxiety was treated.   2.  Palpitations with intermittent tachycardia: Suspect likely sinus tachycardia.  Her symptoms are well controlled with carvedilol.  3.  Essential hypertension: Blood pressure is well controlled on current medications.  4.  Stress and anxiety: Significant improvement with sertraline.  Reproductive/Obstetrics                            Anesthesia Physical Anesthesia Plan  ASA: 3  Anesthesia Plan: General   Post-op Pain Management:     Induction: Intravenous  PONV Risk Score and Plan: 3 and Propofol infusion, TIVA and Treatment may vary due to age or medical condition  Airway Management Planned: Natural Airway  Additional Equipment:   Intra-op Plan:   Post-operative Plan:   Informed Consent: I have reviewed the patients History and Physical, chart, labs and discussed the procedure including the risks, benefits and alternatives for the proposed anesthesia with the patient or authorized representative who has indicated his/her understanding and acceptance.       Plan Discussed with: CRNA  Anesthesia Plan Comments: (LMA/GETA backup discussed.  Patient consented for risks of anesthesia including but not limited to:  - adverse reactions to medications - damage to eyes, teeth, lips or other oral mucosa - nerve damage due to positioning  - sore throat or hoarseness - damage to heart, brain, nerves, lungs, other parts of body or loss of life  Informed patient about role of CRNA in peri- and intra-operative care.  Patient voiced understanding.)        Anesthesia Quick Evaluation

## 2022-06-20 NOTE — Anesthesia Postprocedure Evaluation (Signed)
Anesthesia Post Note  Patient: Cindy Stein  Procedure(s) Performed: COLONOSCOPY WITH PROPOFOL  Patient location during evaluation: PACU Anesthesia Type: General Level of consciousness: awake and alert, oriented and patient cooperative Pain management: pain level controlled Vital Signs Assessment: post-procedure vital signs reviewed and stable Respiratory status: spontaneous breathing, nonlabored ventilation and respiratory function stable Cardiovascular status: blood pressure returned to baseline and stable Postop Assessment: adequate PO intake Anesthetic complications: no   No notable events documented.   Last Vitals:  Vitals:   06/20/22 1121 06/20/22 1131  BP: 103/64 110/67  Pulse: 71 62  Resp: 16   Temp:    SpO2: 96% 98%    Last Pain:  Vitals:   06/20/22 1131  TempSrc:   PainSc: 0-No pain                 Darrin Nipper

## 2022-06-20 NOTE — Transfer of Care (Signed)
Immediate Anesthesia Transfer of Care Note  Patient: Cindy Stein  Procedure(s) Performed: COLONOSCOPY WITH PROPOFOL  Patient Location: Endoscopy Unit  Anesthesia Type:General  Level of Consciousness: drowsy  Airway & Oxygen Therapy: Patient Spontanous Breathing and Patient connected to nasal cannula oxygen  Post-op Assessment: Report given to RN, Post -op Vital signs reviewed and stable and Patient moving all extremities  Post vital signs: Reviewed and stable  Last Vitals:  Vitals Value Taken Time  BP 113/76 06/20/22 1111  Temp 35.9 C 06/20/22 1111  Pulse 61 06/20/22 1111  Resp 20 06/20/22 1111  SpO2 95 % 06/20/22 1111    Last Pain:  Vitals:   06/20/22 1111  TempSrc: Temporal  PainSc: Asleep         Complications: No notable events documented.

## 2022-06-20 NOTE — Op Note (Signed)
[facility] Gastroenterology Patient Name: Cindy Stein Procedure Date: 06/20/2022 10:39 AM MRN: [id number] Account #: [id number] Date of Birth: [birth year] Admit Type: Outpatient Age: 66 Room: [facility] ROOM 3 Gender: Female Note Status: Finalized Instrument Name: Jasper Riling [id number] Procedure:             Colonoscopy Indications:           Abnormal CT of the GI tract Providers:             Jonathon Bellows MD, MD Referring MD:          Mertie Clause. Fletcher Anon, MD (Referring MD) Medicines:             Monitored Anesthesia Care Complications:         No immediate complications. Procedure:             Pre-Anesthesia Assessment:                        - Prior to the procedure, a History and Physical was                         performed, and patient medications, allergies and                         sensitivities were reviewed. The patient's tolerance                         of previous anesthesia was reviewed.                        - The risks and benefits of the procedure and the                         sedation options and risks were discussed with the                         patient. All questions were answered and informed                         consent was obtained.                        - ASA Grade Assessment: II - A patient with mild                         systemic disease.                        After obtaining informed consent, the colonoscope was                         passed under direct vision. Throughout the procedure,                         the patient's blood pressure, pulse, and oxygen                         saturations were monitored continuously. The                         Colonoscope  was introduced through the anus and                         advanced to the the cecum, identified by the                         appendiceal orifice. The colonoscopy was performed                         with ease. The patient tolerated the procedure well.                          The quality of the bowel preparation was poor. Findings:      The perianal and digital rectal examinations were normal.      A 10 mm polyp was found in the ascending colon. The polyp was sessile.       The polyp was removed with a cold snare. Resection and retrieval were       complete.      The exam was otherwise without abnormality on direct and retroflexion       views. Impression:            - Preparation of the colon was poor.                        - One 10 mm polyp in the ascending colon, removed with                         a cold snare. Resected and retrieved.                        - The examination was otherwise normal on direct and                         retroflexion views. Recommendation:        - Discharge patient to home (with escort).                        - Resume previous diet.                        - Continue present medications.                        - Await pathology results.                        - Repeat colonoscopy in 1 year because the bowel                         preparation was suboptimal. Procedure Code(s):     --- Professional ---                        [phone], Colonoscopy, flexible; with removal of                         tumor(s), polyp(s), or other lesion(s) by snare  technique Diagnosis Code(s):     --- Professional ---                        K63.5, Polyp of colon                        R93.3, Abnormal findings on diagnostic imaging of                         other parts of digestive tract CPT copyright 2019 American Medical Association. All rights reserved. The codes documented in this report are preliminary and upon coder review may  be revised to meet current compliance requirements. Jonathon Bellows, MD Jonathon Bellows MD, MD 06/20/2022 11:10:26 AM This report has been signed electronically. Number of Addenda: 0 Note Initiated On: 06/20/2022 10:39 AM Scope Withdrawal Time: 0 hours 11 minutes 45 seconds  Total  Procedure Duration: 0 hours 13 minutes 58 seconds  Estimated Blood Loss:  Estimated blood loss: none.      [facility]

## 2022-06-21 ENCOUNTER — Encounter: Payer: Self-pay | Admitting: Gastroenterology

## 2022-06-21 LAB — SURGICAL PATHOLOGY

## 2022-06-24 ENCOUNTER — Other Ambulatory Visit: Payer: Self-pay | Admitting: Family Medicine

## 2022-06-24 DIAGNOSIS — F32A Depression, unspecified: Secondary | ICD-10-CM

## 2022-06-24 DIAGNOSIS — Z96652 Presence of left artificial knee joint: Secondary | ICD-10-CM | POA: Diagnosis not present

## 2022-06-25 ENCOUNTER — Ambulatory Visit (INDEPENDENT_AMBULATORY_CARE_PROVIDER_SITE_OTHER): Payer: Medicare Other | Admitting: Vascular Surgery

## 2022-06-26 ENCOUNTER — Other Ambulatory Visit: Payer: Self-pay | Admitting: Orthopedic Surgery

## 2022-06-26 DIAGNOSIS — Z96652 Presence of left artificial knee joint: Secondary | ICD-10-CM

## 2022-06-29 LAB — H PYLORI, IGM, IGG, IGA AB
H Pylori IgG: 0.57 Index Value (ref 0.00–0.79)
H. Pylogi, Iga Abs: 19.1 units — ABNORMAL HIGH (ref 0.0–8.9)
H. Pylogi, Igm Abs: <9 units (ref 0.0–8.9)

## 2022-07-02 ENCOUNTER — Other Ambulatory Visit: Payer: Self-pay | Admitting: Family

## 2022-07-02 DIAGNOSIS — F32A Depression, unspecified: Secondary | ICD-10-CM

## 2022-07-03 ENCOUNTER — Encounter
Admission: RE | Admit: 2022-07-03 | Discharge: 2022-07-03 | Disposition: A | Discharge status: Home / Self Care | Payer: Medicare Other | Source: Ambulatory Visit | Attending: Orthopedic Surgery | Admitting: Orthopedic Surgery

## 2022-07-03 DIAGNOSIS — M25562 Pain in left knee: Secondary | ICD-10-CM | POA: Diagnosis not present

## 2022-07-03 DIAGNOSIS — Z96652 Presence of left artificial knee joint: Secondary | ICD-10-CM | POA: Insufficient documentation

## 2022-07-03 DIAGNOSIS — Z96653 Presence of artificial knee joint, bilateral: Secondary | ICD-10-CM | POA: Diagnosis not present

## 2022-07-03 DIAGNOSIS — Z471 Aftercare following joint replacement surgery: Secondary | ICD-10-CM | POA: Diagnosis not present

## 2022-07-03 MED ORDER — TECHNETIUM TC 99M MEDRONATE IV KIT
20.0000 | PACK | Freq: Once | INTRAVENOUS | Status: AC | PRN
  Administered 2022-07-03: 19.65 via INTRAVENOUS

## 2022-07-08 DIAGNOSIS — Z96651 Presence of right artificial knee joint: Secondary | ICD-10-CM | POA: Diagnosis not present

## 2022-07-09 ENCOUNTER — Ambulatory Visit (INDEPENDENT_AMBULATORY_CARE_PROVIDER_SITE_OTHER): Payer: Medicare Other

## 2022-07-09 VITALS — Ht 63.0 in | Wt 196.0 lb

## 2022-07-09 DIAGNOSIS — Z0000 Encounter for general adult medical examination without abnormal findings: Secondary | ICD-10-CM

## 2022-07-09 NOTE — Patient Instructions (Addendum)
  Ms. Chervenak , Thank you for taking time to come for your Medicare Wellness Visit. I appreciate your ongoing commitment to your health goals. Please review the following plan we discussed and let me know if I can assist you in the future.   These are the goals we discussed:  Goals      Healthy lifestyle     Healthy diet Stay active Weight loss goal 20lb        This is a list of the screening recommended for you and due dates:  Health Maintenance  Topic Date Due   Flu Shot  07/02/2022   DEXA scan (bone density measurement)  09/01/2022*   Colon Cancer Screening  06/21/2023   Mammogram  11/07/2023   Pneumonia Vaccine  Completed   HPV Vaccine  Aged Out   Tetanus Vaccine  Discontinued   COVID-19 Vaccine  Discontinued   Hepatitis C Screening: USPSTF Recommendation to screen - Ages 17-79 yo.  Discontinued   Zoster (Shingles) Vaccine  Discontinued  *Topic was postponed. The date shown is not the original due date.

## 2022-07-09 NOTE — Progress Notes (Signed)
Subjective:   Cindy Stein is a 66 y.o. female who presents for Medicare Annual (Subsequent) preventive examination.  Review of Systems    No ROS.  Medicare Wellness Virtual Visit.  Visual/audio telehealth visit, UTA vital signs.   See social history for additional risk factors.   Cardiac Risk Factors include: advanced age (>35mn, >>28women);hypertension     Objective:    Today's Vitals   07/09/22 0954  Weight: 196 lb (88.9 kg)  Height: '5\' 3"'$  (1.6 m)   Body mass index is 34.72 kg/m.     07/09/2022    9:50 AM 06/20/2022    9:44 AM 06/05/2022    1:38 PM 08/01/2021   10:31 AM 07/13/2021    2:26 PM 06/15/2021    2:05 PM 06/14/2020    1:40 PM  Advanced Directives  Does Patient Have a Medical Advance Directive? Yes Yes No No Yes Yes Yes  Type of AParamedicof AParadiseLiving will Living will;Healthcare Power of Attorney   Living will Living will;Healthcare Power of Attorney Living will  Does patient want to make changes to medical advance directive? No - Patient declined     No - Patient declined No - Patient declined  Copy of HRosamondin Chart? No - copy requested No - copy requested    No - copy requested     Current Medications (verified) Outpatient Encounter Medications as of 07/09/2022  Medication Sig   aspirin EC 81 MG tablet Take 81 mg by mouth in the morning. Swallow whole.   carvedilol (COREG) 6.25 MG tablet TAKE 1 TABLET BY MOUTH TWICE A DAY   etanercept (ENBREL) 50 MG/ML injection Inject into the skin.   naproxen sodium (ALEVE) 220 MG tablet Take 220 mg by mouth daily as needed (pain.).   olmesartan-hydrochlorothiazide (BENICAR HCT) 20-12.5 MG tablet TAKE 1 TABLET BY MOUTH ONCE A DAY   ondansetron (ZOFRAN-ODT) 4 MG disintegrating tablet Take 1 tablet (4 mg total) by mouth every 8 (eight) hours as needed for nausea or vomiting.   pantoprazole (PROTONIX) 40 MG tablet Take 1 tablet (40 mg total) by mouth daily.   sertraline  (ZOLOFT) 50 MG tablet TAKE 1 TABLET BY MOUTH ONCE DAILY   No facility-administered encounter medications on file as of 07/09/2022.    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   a.) LEFT femoral, popliteal, posterior tibial, and paroneal veins; Tx'd with Rivaroxaban; b.) embolized on 03/21/2020 requiring PA thrombolysis and mechanical thrombectomy on 03/22/2020   Grade I diastolic dysfunction [id number]/38/7564  History of immunosuppression    long-term MTX for RA diagnosis   History of PSVT (paroxysmal supraventricular tachycardia) 07/22/2019   a.) brief 5 beat run noted on Zio   HLD (hyperlipidemia)    Hypertension    [place]id[place]    [place]   Osteoarthritis    Plantar fascial fibromatosis    [place]ulmonary embolus ([place]   a.) BILATERAL; required PA thrombolysis and mechanical thrombectomy on 03/22/2020; b.) IVC filter placed prior to knee surgery on 12/12/2020 and removed on 04/19/2021   PVC (premature ventricular contraction)    Rheumatoid arthritis (HCC)    Valgus deformity of great toes, bilateral    Vitamin D deficiency    Past Surgical History:  Procedure  Laterality Date   ANTERIOR AND POSTERIOR REPAIR N/A 07/23/2021   Procedure: ANTERIOR (CYSTOCELE) AND POSTERIOR REPAIR (RECTOCELE);  Surgeon: Harlin Heys, MD;  Location: [facility] ORS;  Service: Gynecology;  Laterality: N/A;   CARDIAC CATHETERIZATION  2006   carpel tunnel Bilateral    CATARACT EXTRACTION, BILATERAL     CHOLECYSTECTOMY N/A 2006   COLONOSCOPY WITH PROPOFOL N/A 06/20/2022   Procedure: COLONOSCOPY WITH PROPOFOL;  Surgeon: Jonathon Bellows, MD;  Location: [facility] ENDOSCOPY;  Service: Gastroenterology;  Laterality: N/A;   IVC FILTER INSERTION N/A 12/11/2020   Procedure: IVC FILTER INSERTION;  Surgeon: Algernon Huxley, MD;   Location: [place] CV LAB;  Service: Cardiovascular;  Laterality: N/A;   IVC FILTER REMOVAL N/A 04/19/2021   Procedure: IVC FILTER REMOVAL;  Surgeon: Algernon Huxley, MD;  Location: [place] CV LAB;  Service: Cardiovascular;  Laterality: N/A;   JOINT REPLACEMENT Right 2021   KNEE   LEFT HEART CATH AND CORONARY ANGIOGRAPHY Left 06/14/2019   Procedure: LEFT HEART CATH AND CORONARY ANGIOGRAPHY;  Surgeon: Wellington Hampshire, MD;  Location: [place] CV LAB;  Service: Cardiovascular;  Laterality: Left;   PUBOVAGINAL SLING N/A 07/23/2021   Procedure: Gaynelle Arabian;  Surgeon: Harlin Heys, MD;  Location: [facility] ORS;  Service: Gynecology;  Laterality: N/A;   PULMONARY THROMBECTOMY Bilateral 03/22/2020   Procedure: PULMONARY THROMBECTOMY / THROMBOLYSIS;  Surgeon: Algernon Huxley, MD;  Location: [place] CV LAB;  Service: Cardiovascular;  Laterality: Bilateral;   REPLACEMENT TOTAL KNEE Left 2022   VAGINAL HYSTERECTOMY N/A 07/23/2021   Procedure: HYSTERECTOMY VAGINAL;  Surgeon: Harlin Heys, MD;  Location: [facility] ORS;  Service: Gynecology;  Laterality: N/A;   Family History  Problem Relation Age of Onset   Asthma Mother    Asthma Father    Heart failure Father    Leukemia Sister    Breast cancer Neg Hx    Social History   Socioeconomic History   Marital status: Married    Spouse name: Jeno   Number of children: 1   Years of education: Not on file   Highest education level: Not on file  Occupational History   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  Tobacco Use   Smoking status: Never   Smokeless tobacco: Never  Vaping Use   Vaping Use: Never used  Substance and Sexual Activity   Alcohol use: Not Currently    Comment: OCCAS   Drug use: Never   Sexual activity: Not Currently    Birth control/protection: Surgical    Comment: hysterectomy  Other Topics Concern   Not on file  Social History Narrative   Lives at home with spouse   Social Determinants  of Health   Financial Resource Strain: Low Risk  (07/09/2022)   Overall Financial Resource Strain (CARDIA)    Difficulty of Paying Living Expenses: Not hard at all  Food Insecurity: No Food Insecurity (07/09/2022)   Hunger Vital Sign    Worried About Running Out of Food in the Last Year: Never true    [place] in the Last Year: Never true  Transportation Needs: No Transportation Needs (07/09/2022)   PRAPARE - Hydrologist (Medical): No    Lack of Transportation (Non-Medical): No  Physical Activity: Sufficiently Active (06/15/2021)   Exercise Vital Sign    Days of Exercise per Week: 5 days    Minutes of Exercise per Session: 30 min  Stress: No Stress Concern  Present (07/09/2022)   [employer]    Feeling of Stress : Not at all  Social Connections: Unknown (07/09/2022)   Social Connection and Isolation Panel [NHANES]    Frequency of Communication with Friends and Family: More than three times a week    Frequency of Social Gatherings with Friends and Family: More than three times a week    Attends Religious Services: Not on Advertising copywriter or Organizations: Not on file    Attends Archivist Meetings: Not on file    Marital Status: Married    Tobacco Counseling Counseling given: Not Answered   Clinical Intake:  Pre-visit preparation completed: Yes        Diabetes: No  How often do you need to have someone help you when you read instructions, pamphlets, or other written materials from your doctor or pharmacy?: 1 - Never    Interpreter Needed?: No      Activities of Daily Living    07/09/2022    9:52 AM 07/23/2021    4:00 PM  In your present state of health, do you have any difficulty performing the following activities:  Hearing? 0 0  Vision? 0 0  Difficulty concentrating or making decisions? 0 0  Walking or climbing stairs? 1 0  Comment Chronic knee  pain. Paces self with activity.   Dressing or bathing? 0 0  Doing errands, shopping? 0 0  Preparing Food and eating ? N   Using the Toilet? N   In the past six months, have you accidently leaked urine? N   Do you have problems with loss of bowel control? N   Managing your Medications? N   Managing your Finances? N   Housekeeping or managing your Housekeeping? N     Patient Care Team: Leone Haven, MD as PCP - General (Family Medicine) Wellington Hampshire, MD as PCP - Cardiology (Cardiology)  Indicate any recent Medical Services you may have received from other than [facility] providers in the past year (date may be approximate).     Assessment:   This is a routine wellness examination for [place].  Virtual Visit via Telephone Note  I connected with  [name] on 07/09/22 at  9:45 AM EDT by telephone and verified that I am speaking with the correct person using two identifiers.  Location: Patient: home Provider: office Persons participating in the virtual visit: patient/Nurse Health Advisor   I discussed the limitations of performing an evaluation and management service by telehealth. We continued and completed visit with audio only. Some vital signs may be absent or patient reported.   Hearing/Vision screen Hearing Screening - Comments:: Patient is able to hear conversational tones without difficulty. No issues reported.  Vision Screening - Comments:: Followed by [facility] Specialist   Dietary issues and exercise activities discussed: Current Exercise Habits: Home exercise routine, Type of exercise: calisthenics (Stationary bike), Frequency (Times/Week): 2, Intensity: Mild Healthy diet Good water intake   Goals Addressed             This Visit's Progress    Healthy lifestyle       Healthy diet Stay active Weight loss goal 20lb       Depression Screen    07/09/2022    9:52 AM 06/11/2022    8:28 AM 01/09/2022    8:35 AM 12/12/2021    8:46 AM 09/25/2021    12:12 PM 09/11/2021  10:23 AM 08/08/2021    9:02 AM  PHQ 2/9 Scores  PHQ - 2 Score 0 0 0 0 0 0 5  PHQ- 9 Score     0  11    Fall Risk    07/09/2022    9:51 AM 01/09/2022    8:35 AM 12/12/2021    8:42 AM 09/11/2021   10:22 AM 08/08/2021    8:39 AM  Fall Risk   Falls in the past year? 0 0 0 0 0  Number falls in past yr:  0 0 0 0  Injury with Fall?  0 0 0 0  Risk for fall due to :  No Fall Risks     Follow up Falls evaluation completed Falls evaluation completed   Falls evaluation completed    [place]: Home free of loose throw rugs in walkways, pet beds, electrical cords, etc? Yes  Adequate lighting in your home to reduce risk of falls? Yes   ASSISTIVE DEVICES UTILIZED TO PREVENT FALLS: Life alert? No  Use of a cane, walker or w/c? No   TIMED UP AND GO: Was the test performed? No .   Cognitive Function: Patient is alert and oriented x3.         07/09/2022   10:08 AM 06/15/2021    2:32 PM 06/14/2020    1:52 PM  6CIT Screen  What Year? 0 points 0 points 0 points  What month? 0 points 0 points 0 points  What time? 0 points 0 points   Count back from 20 0 points 0 points   Months in reverse 0 points 0 points 0 points  Repeat phrase 0 points 0 points 0 points  Total Score 0 points 0 points     Immunizations Immunization History  Administered Date(s) Administered   Fluad Quad(high Dose 65+) 09/04/2020   PNEUMOCOCCAL CONJUGATE-20 08/08/2021   Dexa Scan- deferred per patient preference.   Screening Tests Health Maintenance  Topic Date Due   INFLUENZA VACCINE  07/02/2022   DEXA SCAN  09/01/2022 (Originally 02/04/2019)   COLONOSCOPY (Pts 45-64yr Insurance coverage will need to be confirmed)  06/21/2023   MAMMOGRAM  11/07/2023   Pneumonia Vaccine 66 Years old  Completed   HPV VACCINES  Aged Out   TETANUS/TDAP  Discontinued   COVID-19 Vaccine  Discontinued   Hepatitis C Screening  Discontinued   Zoster Vaccines- Shingrix   Discontinued   Health Maintenance Health Maintenance Due  Topic Date Due   INFLUENZA VACCINE  07/02/2022   Lung Cancer Screening: (Low Dose CT Chest recommended if Age 68-80years, 30 pack-year currently smoking OR have quit w/in 15years.) does not qualify.   Vision Screening: Recommended annual ophthalmology exams for early detection of glaucoma and other disorders of the eye.  Dental Screening: Recommended annual dental exams for proper oral hygiene  Community Resource Referral / Chronic Care Management: CRR required this visit?  No   CCM required this visit?  No      Plan:   Keep all routine maintenance appointments.   I have personally reviewed and noted the following in the patient's chart:   Medical and social history Use of alcohol, tobacco or illicit drugs  Current medications and supplements including opioid prescriptions.  Functional ability and status Nutritional status Physical activity Advanced directives List of other physicians Hospitalizations, surgeries, and ER visits in previous 12 months Vitals Screenings to include cognitive, depression, and falls Referrals and appointments  In addition,  I have reviewed and discussed with patient certain preventive protocols, quality metrics, and best practice recommendations. A written personalized care plan for preventive services as well as general preventive health recommendations were provided to patient.     Varney Biles, LPN   06/03/7105

## 2022-07-12 ENCOUNTER — Ambulatory Visit (INDEPENDENT_AMBULATORY_CARE_PROVIDER_SITE_OTHER): Payer: Medicare Other | Admitting: Family Medicine

## 2022-07-12 ENCOUNTER — Telehealth: Payer: Self-pay

## 2022-07-12 ENCOUNTER — Encounter: Payer: Self-pay | Admitting: Family Medicine

## 2022-07-12 ENCOUNTER — Telehealth: Payer: Self-pay | Admitting: Family Medicine

## 2022-07-12 VITALS — BP 110/80 | HR 70 | Temp 99.1°F | Ht 63.0 in | Wt 198.4 lb

## 2022-07-12 DIAGNOSIS — Z96659 Presence of unspecified artificial knee joint: Secondary | ICD-10-CM

## 2022-07-12 DIAGNOSIS — R1013 Epigastric pain: Secondary | ICD-10-CM | POA: Diagnosis not present

## 2022-07-12 NOTE — Telephone Encounter (Signed)
Leone Haven, MD  Fulton Mole D, CMA Please let the patient know that her h pylori lab work that was done when she was in the ED was positive for a specific H pylori antibody. Has she ever been treated for h pylori?    LMOM for pt to CB

## 2022-07-12 NOTE — Assessment & Plan Note (Signed)
Tenderness in the epigastric region.  We will check a lipase and hepatic function panel.  This certainly could be related to H. pylori.  Given her pattern of antibody positivity I will check with GI to make sure that they agree with the need for treatment.  We will contact her once I hear back from them.

## 2022-07-12 NOTE — Telephone Encounter (Signed)
Patinet wanted to know the name of the Bacteria she has and I informed her it was H. Pylori and she understood.  Tomi Paddock,cma

## 2022-07-12 NOTE — Telephone Encounter (Signed)
Patient called stating that she had a very important question to ask, please give her a call.

## 2022-07-12 NOTE — Progress Notes (Signed)
[name], MD Phone: [phone]  Cindy Stein is a 66 y.o. female who presents today for follow-up.  H. pylori IgA positive: This was noted on lab work in the ED.  She was seen for epigastric pain.  She was started on Protonix which has helped.  She notes no reflux.  Notes some abdominal pain that is worse with movement.  Notes this discomfort has been going on since she was in the emergency room.  No blood in her stool.  She notes many years ago she had an EGD and she was told that she had a bacteria in her stomach though was advised to take probiotics.  She notes she has never been treated with antibiotics.  Bilateral knee pain: Patient notes she has had both of these knees replaced.  She has seen orthopedics and they have her doing PT.  She is going to follow-up with the surgeon that did the surgery as well.  Social History   Tobacco Use  Smoking Status Never  Smokeless Tobacco Never    Current Outpatient Medications on File Prior to Visit  Medication Sig Dispense Refill   aspirin EC 81 MG tablet Take 81 mg by mouth in the morning. Swallow whole.     carvedilol (COREG) 6.25 MG tablet TAKE 1 TABLET BY MOUTH TWICE A DAY 180 tablet 1   etanercept (ENBREL) 50 MG/ML injection Inject into the skin.     naproxen sodium (ALEVE) 220 MG tablet Take 220 mg by mouth daily as needed (pain.).     olmesartan-hydrochlorothiazide (BENICAR HCT) 20-12.5 MG tablet TAKE 1 TABLET BY MOUTH ONCE A DAY 90 tablet 1   ondansetron (ZOFRAN-ODT) 4 MG disintegrating tablet Take 1 tablet (4 mg total) by mouth every 8 (eight) hours as needed for nausea or vomiting. 15 tablet 0   pantoprazole (PROTONIX) 40 MG tablet Take 1 tablet (40 mg total) by mouth daily. 30 tablet 2   sertraline (ZOLOFT) 50 MG tablet TAKE 1 TABLET BY MOUTH ONCE DAILY 30 tablet 0   No current facility-administered medications on file prior to visit.     ROS see history of present illness  Objective  Physical Exam Vitals:    07/12/22 1511  BP: 110/80  Pulse: 70  Temp: 99.1 F (37.3 C)  SpO2: 98%    BP Readings from Last 3 Encounters:  07/12/22 110/80  06/20/22 110/67  06/18/22 131/81   Wt Readings from Last 3 Encounters:  07/12/22 198 lb 6.4 oz (90 kg)  07/09/22 196 lb (88.9 kg)  06/20/22 198 lb (89.8 kg)    Physical Exam Abdominal:     General: Bowel sounds are normal. There is no distension.     Palpations: Abdomen is soft.     Tenderness: There is abdominal tenderness (Epigastric). There is no guarding or rebound.      Assessment/Plan: Please see individual problem list.  Problem List Items Addressed This Visit     Epigastric pain - Primary    Tenderness in the epigastric region.  We will check a lipase and hepatic function panel.  This certainly could be related to H. pylori.  Given her pattern of antibody positivity I will check with GI to make sure that they agree with the need for treatment.  We will contact her once I hear back from them.      Relevant Orders   Lipase   Hepatic function panel   History of total knee arthroplasty    She will do physical therapy and  follow-up with orthopedics.       Patient will contact us if she has not heard anything regarding what GI says within a week.  Return for as scheduled.   [name], MD [place]

## 2022-07-12 NOTE — Assessment & Plan Note (Signed)
She will do physical therapy and follow-up with orthopedics.

## 2022-07-13 LAB — HEPATIC FUNCTION PANEL
AG Ratio: 1.4 (calc) (ref 1.0–2.5)
ALT: 30 U/L — ABNORMAL HIGH (ref 6–29)
AST: 26 U/L (ref 10–35)
Albumin: 4 g/dL (ref 3.6–5.1)
Alkaline phosphatase (APISO): 76 U/L (ref 37–153)
Bilirubin, Direct: 0.1 mg/dL (ref 0.0–0.2)
Globulin: 2.8 g/dL (calc) (ref 1.9–3.7)
Indirect Bilirubin: 0.4 mg/dL (calc) (ref 0.2–1.2)
Total Bilirubin: 0.5 mg/dL (ref 0.2–1.2)
Total Protein: 6.8 g/dL (ref 6.1–8.1)

## 2022-07-13 LAB — LIPASE: Lipase: 42 U/L (ref 7–60)

## 2022-07-15 ENCOUNTER — Telehealth: Payer: Self-pay

## 2022-07-15 NOTE — Telephone Encounter (Signed)
LMTCB

## 2022-07-15 NOTE — Telephone Encounter (Signed)
Pt returning call

## 2022-07-15 NOTE — Progress Notes (Signed)
LMTCB

## 2022-07-22 ENCOUNTER — Telehealth: Payer: Self-pay | Admitting: Family Medicine

## 2022-07-22 ENCOUNTER — Telehealth: Payer: Self-pay | Admitting: Gastroenterology

## 2022-07-22 DIAGNOSIS — R768 Other specified abnormal immunological findings in serum: Secondary | ICD-10-CM

## 2022-07-22 NOTE — Telephone Encounter (Signed)
Patient states she has been throwing up and her primary care doctor thinks she needs to be on a medication. This was the message that was taken today by Gertie Fey. Patient does not know what to do since she has been throwing up since last Wednesday.

## 2022-07-22 NOTE — Telephone Encounter (Signed)
Patient states she has been throwing up and her primary care doctor thinks she needs to be on a medication, Patient states she has been trying to get in touch with [place] and so has her PCP.

## 2022-07-23 ENCOUNTER — Other Ambulatory Visit: Payer: Self-pay

## 2022-07-23 DIAGNOSIS — R1013 Epigastric pain: Secondary | ICD-10-CM

## 2022-07-23 MED ORDER — ONDANSETRON 4 MG PO TBDP: 4.0000 mg | ORAL_TABLET | Freq: Three times a day (TID) | ORAL | 0 refills | Status: DC | PRN

## 2022-07-23 NOTE — Telephone Encounter (Signed)
I called and spoke with the patient and informed her that the provider heard from GI and she is to stop the protonix for 1 week and she understood.  I also informed her that the provider stated I could send in the Zofran for her nausea, patient stated that she vomited 3-4 times per day  and not at all today , she was having stomach pain but when  she drinks anything cold it stops the pain.  I sent in the zofran for patient and she is schedule in  one week for labs for H. Pylori test, I did not order the test.  Joash Tony,cma

## 2022-07-23 NOTE — Telephone Encounter (Signed)
Informed patient and she verbalized understanding she will contact her PCP

## 2022-07-23 NOTE — Telephone Encounter (Signed)
Pt called stating that her Gertie Fey doctor stated its completely up to her primary care doctor to take care of this and pt feels like we do not care. Pt would like to be called

## 2022-07-23 NOTE — Telephone Encounter (Signed)
Patient states she started vomited for 5 days. She states since Monday she is only nausea. Denies any abdominal pain or vomiting any blood. Only able to keep liquids down. She loss 7 pounds in a week. She was able to eat a meal last night for dinner and has kept it down. She states for 2 weeks her PCP and her has been trying to contact us about the result of her H pylori IGA that was done in the hospital. She states her PCP states she needs to be on antibiotics to clear it up. Please advise

## 2022-07-24 NOTE — Telephone Encounter (Signed)
H pylori breath test ordered

## 2022-07-24 NOTE — Addendum Note (Signed)
Addended by: Leone Haven on: 07/24/2022 10:59 AM   Modules accepted: Orders

## 2022-07-25 ENCOUNTER — Ambulatory Visit: Payer: Medicare Other | Admitting: Gastroenterology

## 2022-07-26 ENCOUNTER — Other Ambulatory Visit: Payer: Self-pay | Admitting: Family Medicine

## 2022-07-31 ENCOUNTER — Other Ambulatory Visit: Payer: Medicare Other

## 2022-07-31 DIAGNOSIS — R768 Other specified abnormal immunological findings in serum: Secondary | ICD-10-CM | POA: Diagnosis not present

## 2022-08-01 LAB — H. PYLORI BREATH TEST: H. pylori Breath Test: NOT DETECTED

## 2022-08-02 ENCOUNTER — Telehealth: Payer: Self-pay | Admitting: *Deleted

## 2022-08-02 NOTE — Telephone Encounter (Signed)
Left voicemail to [facility] for results below:  Please let the patient know that her H. pylori breath test was negative.  I think at this point she needs to see GI for her symptoms.  I can refer her once you speak with her.  Thanks.

## 2022-08-06 NOTE — Telephone Encounter (Signed)
Patient states she is returning our call.  I read Dr. Georges Mouse note to patient.  Patient was accidentally disconnected.  I called patient back and left a voicemail asking her to please call us back and let us know if she is interested in the referral.

## 2022-08-06 NOTE — Telephone Encounter (Signed)
I spoke with patient & helped her to understand the message/results. I explained to her that the H. Pylori breath test was negative which indicates there is no active infection. No further testing or any treatment is needed at this time. I also explained that if she is still having the n&v or stomach pain, that Dr. [name] recommends that she goes back to GI.  Pt denies having any symptoms at this time & does not need to go back to GI at this time. Pt apologize for being so upset at the beginning of our call & appreciated the explanation.

## 2022-08-06 NOTE — Telephone Encounter (Signed)
Patient called back and I asked if she would be interested in the referral.  Patient states she doesn't understand and would like to speak with someone about this.  I transferred call to Cannon Kettle, [place].

## 2022-08-06 NOTE — Telephone Encounter (Signed)
Lvm for pt to return call in regards to lab results.  Per Dr.Sonnenberg: Please let the patient know that her H. pylori breath test was negative.  I think at this point she needs to see GI for her symptoms.  I can refer her once you speak with her.  Thanks.

## 2022-08-14 ENCOUNTER — Other Ambulatory Visit: Payer: Self-pay | Admitting: *Deleted

## 2022-08-14 DIAGNOSIS — F419 Anxiety disorder, unspecified: Secondary | ICD-10-CM

## 2022-08-14 MED ORDER — SERTRALINE HCL 50 MG PO TABS: 50.0000 mg | ORAL_TABLET | Freq: Every day | ORAL | 0 refills | Status: DC

## 2022-08-14 NOTE — Progress Notes (Signed)
Zoloft refilled 90 days patient has appointment scheduled for visit 09/11/22. I explained the test fo rH-pylori came back negative after stopping Protonix.

## 2022-09-10 DIAGNOSIS — Z79899 Other long term (current) drug therapy: Secondary | ICD-10-CM | POA: Diagnosis not present

## 2022-09-10 DIAGNOSIS — M0579 Rheumatoid arthritis with rheumatoid factor of multiple sites without organ or systems involvement: Secondary | ICD-10-CM | POA: Diagnosis not present

## 2022-09-11 ENCOUNTER — Ambulatory Visit (INDEPENDENT_AMBULATORY_CARE_PROVIDER_SITE_OTHER): Payer: Medicare Other | Admitting: Family Medicine

## 2022-09-11 ENCOUNTER — Encounter: Payer: Self-pay | Admitting: Family Medicine

## 2022-09-11 VITALS — BP 90/60 | HR 59 | Temp 98.6°F | Ht 63.0 in | Wt 197.2 lb

## 2022-09-11 DIAGNOSIS — I10 Essential (primary) hypertension: Secondary | ICD-10-CM | POA: Diagnosis not present

## 2022-09-11 DIAGNOSIS — F32A Depression, unspecified: Secondary | ICD-10-CM | POA: Diagnosis not present

## 2022-09-11 DIAGNOSIS — Z23 Encounter for immunization: Secondary | ICD-10-CM | POA: Diagnosis not present

## 2022-09-11 DIAGNOSIS — R1013 Epigastric pain: Secondary | ICD-10-CM

## 2022-09-11 DIAGNOSIS — K629 Disease of anus and rectum, unspecified: Secondary | ICD-10-CM

## 2022-09-11 DIAGNOSIS — R310 Gross hematuria: Secondary | ICD-10-CM | POA: Diagnosis not present

## 2022-09-11 DIAGNOSIS — F419 Anxiety disorder, unspecified: Secondary | ICD-10-CM

## 2022-09-11 DIAGNOSIS — M069 Rheumatoid arthritis, unspecified: Secondary | ICD-10-CM | POA: Diagnosis not present

## 2022-09-11 NOTE — Assessment & Plan Note (Signed)
Patient completed urology evaluation for this.  She had a cystoscopy with no noted cause.  Urology felt as though this was related to an infection that she had previously.

## 2022-09-11 NOTE — Assessment & Plan Note (Signed)
She will continue to follow with rheumatology.

## 2022-09-11 NOTE — Assessment & Plan Note (Signed)
Patient underwent colonoscopy with GI.  They did not find any abnormalities in the rectal area.

## 2022-09-11 NOTE — Assessment & Plan Note (Signed)
Stable.  She will continue Zoloft 50 mg daily. 

## 2022-09-11 NOTE — Progress Notes (Signed)
[name], MD Phone: [phone]  Cindy Stein is a 66 y.o. female who presents today for f/u.  HYPERTENSION Disease Monitoring Home BP Monitoring 90-120/60-80 Chest pain- no    Dyspnea- no Medications Compliance-  taking coreg, olmesartan, HCTZ. Lightheadedness- no  Edema- no BMET    Component Value Date/Time   NA 141 06/18/2022 0941   K 4.0 06/18/2022 0941   CL 103 06/18/2022 0941   CO2 25 06/18/2022 0941   GLUCOSE 98 06/18/2022 0941   GLUCOSE 99 06/05/2022 1302   BUN 19 06/18/2022 0941   CREATININE 0.78 06/18/2022 0941   CALCIUM 9.1 06/18/2022 0941   GFRNONAA >60 06/05/2022 1302   GFRAA 38 (L) 04/26/2020 1646   Anxiety/depression: Patient notes no anxiety or depression.  She does note its been tough with her husband having dementia though she feels as though this issue is well controlled.  She notes that Zoloft has been helpful.  No SI.  Epigastric pain: this has resolved.  She is no longer on Protonix.  She had a negative H. pylori breath test.  Rheumatoid arthritis: She saw a rheumatology yesterday.  She notes they started her on methotrexate and advised her to take folic acid with this.  She is also on Enbrel.  She notes the Enbrel has helped some with her joint pain.  She rarely takes Aleve for this.  Social History   Tobacco Use  Smoking Status Never  Smokeless Tobacco Never    Current Outpatient Medications on File Prior to Visit  Medication Sig Dispense Refill   aspirin EC 81 MG tablet Take 81 mg by mouth in the morning. Swallow whole.     carvedilol (COREG) 6.25 MG tablet TAKE 1 TABLET BY MOUTH TWICE A DAY 180 tablet 1   etanercept (ENBREL) 50 MG/ML injection Inject into the skin.     naproxen sodium (ALEVE) 220 MG tablet Take 220 mg by mouth daily as needed (pain.).     olmesartan-hydrochlorothiazide (BENICAR HCT) 20-12.5 MG tablet TAKE 1 TABLET BY MOUTH ONCE A DAY 90 tablet 1   ondansetron (ZOFRAN-ODT) 4 MG disintegrating tablet Take 1 tablet (4  mg total) by mouth every 8 (eight) hours as needed for nausea or vomiting. 15 tablet 0   sertraline (ZOLOFT) 50 MG tablet Take 1 tablet (50 mg total) by mouth daily. 90 tablet 0   methotrexate (RHEUMATREX) 2.5 MG tablet Take 20 mg by mouth once a week.     pantoprazole (PROTONIX) 40 MG tablet Take 1 tablet (40 mg total) by mouth daily. 30 tablet 2   No current facility-administered medications on file prior to visit.     ROS see history of present illness  Objective  Physical Exam Vitals:   09/11/22 1001  BP: 90/60  Pulse: (!) 59  Temp: 98.6 F (37 C)  SpO2: 98%    BP Readings from Last 3 Encounters:  09/11/22 90/60  07/12/22 110/80  06/20/22 110/67   Wt Readings from Last 3 Encounters:  09/11/22 197 lb 3.2 oz (89.4 kg)  07/12/22 198 lb 6.4 oz (90 kg)  07/09/22 196 lb (88.9 kg)    Physical Exam Constitutional:      General: She is not in acute distress.    Appearance: She is not diaphoretic.  Cardiovascular:     Rate and Rhythm: Normal rate and regular rhythm.     Heart sounds: Normal heart sounds.  Pulmonary:     Effort: Pulmonary effort is normal.     Breath sounds: Normal  breath sounds.  Skin:    General: Skin is warm and dry.  Neurological:     Mental Status: She is alert.      Assessment/Plan: Please see individual problem list.  Problem List Items Addressed This Visit     Anxiety and depression (Chronic)    Stable.  She will continue Zoloft 50 mg daily.      Hypertension (Chronic)    Well-controlled.  She will continue carvedilol 6.25 mg twice daily, olmesartan-HCTZ 20-12.5 mg daily.      RESOLVED: Rectal abnormality on CT (Chronic)    Patient underwent colonoscopy with GI.  They did not find any abnormalities in the rectal area.      Rheumatoid arthritis ([place]) (Chronic)    She will continue to follow with rheumatology.      Relevant Medications   methotrexate (RHEUMATREX) 2.5 MG tablet   RESOLVED: Epigastric pain    Resolved.  She will  let us know if this recurs.      RESOLVED: Gross hematuria    Patient completed urology evaluation for this.  She had a cystoscopy with no noted cause.  Urology felt as though this was related to an infection that she had previously.      Other Visit Diagnoses     Need for immunization against influenza    -  Primary   Relevant Orders   Flu Vaccine QUAD High Dose(Fluad) (Completed)       Return in about 6 months (around 03/13/2023) for Hypertension.   [name], MD [place]

## 2022-09-11 NOTE — Assessment & Plan Note (Signed)
Resolved.  She will let us know if this recurs.

## 2022-09-11 NOTE — Assessment & Plan Note (Signed)
Well-controlled.  She will continue carvedilol 6.25 mg twice daily, olmesartan-HCTZ 20-12.5 mg daily.

## 2022-09-30 ENCOUNTER — Other Ambulatory Visit: Payer: Self-pay | Admitting: Family Medicine

## 2022-09-30 ENCOUNTER — Encounter (INDEPENDENT_AMBULATORY_CARE_PROVIDER_SITE_OTHER): Payer: Self-pay

## 2022-09-30 DIAGNOSIS — I10 Essential (primary) hypertension: Secondary | ICD-10-CM

## 2022-11-04 ENCOUNTER — Other Ambulatory Visit: Payer: Self-pay | Admitting: Family Medicine

## 2022-11-04 DIAGNOSIS — F419 Anxiety disorder, unspecified: Secondary | ICD-10-CM

## 2022-11-19 ENCOUNTER — Telehealth: Payer: Self-pay | Admitting: Family Medicine

## 2022-11-19 ENCOUNTER — Other Ambulatory Visit: Payer: Self-pay

## 2022-11-19 DIAGNOSIS — R928 Other abnormal and inconclusive findings on diagnostic imaging of breast: Secondary | ICD-10-CM

## 2022-11-19 DIAGNOSIS — N6001 Solitary cyst of right breast: Secondary | ICD-10-CM

## 2022-11-19 NOTE — Telephone Encounter (Signed)
Pt would like a order put in for a check up for her mammogram

## 2022-11-19 NOTE — Telephone Encounter (Signed)
I did not know how to order a diagnostic mammogram for the patient, sorry.  Kara Mierzejewski,cma

## 2022-11-20 NOTE — Telephone Encounter (Signed)
LVM for patient to call back if she has any questions, I also stated that the diagnostic mammogram was ordered and someone would cal her to schedule.  Octivia Canion,cma

## 2022-11-20 NOTE — Telephone Encounter (Signed)
Ordered

## 2022-12-06 ENCOUNTER — Ambulatory Visit
Admission: RE | Admit: 2022-12-06 | Discharge: 2022-12-06 | Disposition: A | Discharge status: Home / Self Care | Payer: Medicare Other | Source: Ambulatory Visit | Attending: Family Medicine | Admitting: Family Medicine

## 2022-12-06 DIAGNOSIS — R928 Other abnormal and inconclusive findings on diagnostic imaging of breast: Secondary | ICD-10-CM | POA: Insufficient documentation

## 2022-12-16 ENCOUNTER — Encounter: Payer: Self-pay | Admitting: Family Medicine

## 2022-12-16 ENCOUNTER — Ambulatory Visit (INDEPENDENT_AMBULATORY_CARE_PROVIDER_SITE_OTHER): Payer: Medicare Other | Admitting: Family Medicine

## 2022-12-16 VITALS — BP 110/70 | HR 80 | Temp 99.2°F | Ht 63.0 in | Wt 202.0 lb

## 2022-12-16 DIAGNOSIS — R051 Acute cough: Secondary | ICD-10-CM

## 2022-12-16 DIAGNOSIS — R059 Cough, unspecified: Secondary | ICD-10-CM | POA: Insufficient documentation

## 2022-12-16 DIAGNOSIS — R1011 Right upper quadrant pain: Secondary | ICD-10-CM

## 2022-12-16 LAB — HEPATIC FUNCTION PANEL
ALT: 27 U/L (ref 0–35)
AST: 22 U/L (ref 0–37)
Albumin: 4 g/dL (ref 3.5–5.2)
Alkaline Phosphatase: 92 U/L (ref 39–117)
Bilirubin, Direct: 0.1 mg/dL (ref 0.0–0.3)
Total Bilirubin: 0.3 mg/dL (ref 0.2–1.2)
Total Protein: 7.1 g/dL (ref 6.0–8.3)

## 2022-12-16 LAB — POC COVID19 BINAXNOW: SARS Coronavirus 2 Ag: NEGATIVE

## 2022-12-16 LAB — POCT INFLUENZA A/B
Influenza A, POC: NEGATIVE
Influenza B, POC: NEGATIVE

## 2022-12-16 MED ORDER — OLMESARTAN MEDOXOMIL-HCTZ 20-12.5 MG PO TABS: 1.0000 | ORAL_TABLET | Freq: Every day | ORAL | 1 refills | Status: DC

## 2022-12-16 MED ORDER — DOXYCYCLINE HYCLATE 100 MG PO TABS: 100.0000 mg | ORAL_TABLET | Freq: Two times a day (BID) | ORAL | 0 refills | Status: DC

## 2022-12-16 NOTE — Assessment & Plan Note (Addendum)
We will check LFTs.  She may need to follow back up with GI depending on her LFT results.

## 2022-12-16 NOTE — Patient Instructions (Signed)
Nice to see you. I am to treat you for bronchitis with doxycycline.  We are going to do a sendoff COVID test just to make sure you do not have COVID.  Please stay home until we get that test result back. If you develop shortness of breath, cough productive of blood, or fevers please seek medical attention.

## 2022-12-16 NOTE — Assessment & Plan Note (Addendum)
Several week history of cough.  Discussed it is possible that she could have had a viral illness initially though eventually developed a bacterial illness such as bronchitis or had 1 illness to start with and then has developed a new illness.  COVID and flu testing were both negative today.  Will send out a COVID PCR test.  She will stay home until we get that test result back.  Will proceed with treatment for bronchitis with doxycycline 100 mg twice daily for 7 days.  I discussed the risk of diarrhea and skin sensitivity to the sun with this antibiotic.

## 2022-12-16 NOTE — Progress Notes (Signed)
[name], MD Phone: [phone]  Cindy Stein is a 67 y.o. female who presents today for same-day visit.  Cough: Patient notes onset of cough around Christmas.  She notes she did get better though then worsened again.  She does have some chest congestion and ear fullness.  No fevers or shortness of breath.  She does have sore throat and bodyaches.  Her daughter was sick with similar symptoms.  Patient did not test for COVID.  No known COVID or flu exposures.  She is been using Alka-Seltzer over-the-counter.  Right upper quadrant pain: Patient reports this is an intermittent ongoing issue.  She is worried about her liver.  Typically she gets discomfort if she eats something rich.  She does not have her gallbladder anymore.  Social History   Tobacco Use  Smoking Status Never  Smokeless Tobacco Never    Current Outpatient Medications on File Prior to Visit  Medication Sig Dispense Refill   aspirin EC 81 MG tablet Take 81 mg by mouth in the morning. Swallow whole.     carvedilol (COREG) 6.25 MG tablet TAKE 1 TABLET BY MOUTH TWICE A DAY 180 tablet 1   etanercept (ENBREL) 50 MG/ML injection Inject into the skin.     methotrexate (RHEUMATREX) 2.5 MG tablet Take 20 mg by mouth once a week.     naproxen sodium (ALEVE) 220 MG tablet Take 220 mg by mouth daily as needed (pain.).     ondansetron (ZOFRAN-ODT) 4 MG disintegrating tablet Take 1 tablet (4 mg total) by mouth every 8 (eight) hours as needed for nausea or vomiting. 15 tablet 0   sertraline (ZOLOFT) 50 MG tablet TAKE 1 TABLET BY MOUTH DAILY 90 tablet 0   pantoprazole (PROTONIX) 40 MG tablet Take 1 tablet (40 mg total) by mouth daily. 30 tablet 2   No current facility-administered medications on file prior to visit.     ROS see history of present illness  Objective  Physical Exam Vitals:   12/16/22 1328  BP: 110/70  Pulse: 80  Temp: 99.2 F (37.3 C)  SpO2: 97%    BP Readings from Last 3 Encounters:  12/16/22  110/70  09/11/22 90/60  07/12/22 110/80   Wt Readings from Last 3 Encounters:  12/16/22 202 lb (91.6 kg)  09/11/22 197 lb 3.2 oz (89.4 kg)  07/12/22 198 lb 6.4 oz (90 kg)    Physical Exam Constitutional:      General: She is not in acute distress.    Appearance: She is not diaphoretic.  HENT:     Left Ear: Tympanic membrane normal.     Mouth/Throat:     Mouth: Mucous membranes are moist.     Pharynx: Oropharynx is clear.  Cardiovascular:     Rate and Rhythm: Normal rate and regular rhythm.     Heart sounds: Normal heart sounds.  Pulmonary:     Effort: Pulmonary effort is normal.     Breath sounds: Normal breath sounds.  Abdominal:     General: Bowel sounds are normal. There is no distension.     Palpations: Abdomen is soft.     Tenderness: There is abdominal tenderness (Slight right upper quadrant tenderness). There is no guarding or rebound.  Lymphadenopathy:     Cervical: No cervical adenopathy.  Skin:    General: Skin is warm and dry.  Neurological:     Mental Status: She is alert.      Assessment/Plan: Please see individual problem list.  Acute cough Assessment &  Plan: Several week history of cough.  Discussed it is possible that she could have had a viral illness initially though eventually developed a bacterial illness such as bronchitis or had 1 illness to start with and then has developed a new illness.  COVID and flu testing were both negative today.  Will send out a COVID PCR test.  She will stay home until we get that test result back.  Will proceed with treatment for bronchitis with doxycycline 100 mg twice daily for 7 days.  I discussed the risk of diarrhea and skin sensitivity to the sun with this antibiotic.  Orders: -     POC COVID-19 BinaxNow -     POCT Influenza A/B -     Doxycycline Hyclate; Take 1 tablet (100 mg total) by mouth 2 (two) times daily.  Dispense: 14 tablet; Refill: 0 -     Novel Coronavirus, NAA ([facility])  Right upper quadrant  pain Assessment & Plan: We will check LFTs.  She may need to follow back up with GI depending on her LFT results.  Orders: -     Hepatic function panel  Other orders -     Olmesartan Medoxomil-HCTZ; Take 1 tablet by mouth daily.  Dispense: 90 tablet; Refill: 1    Return if symptoms worsen or fail to improve.   [name], MD [place]

## 2022-12-17 LAB — NOVEL CORONAVIRUS, NAA: SARS-CoV-2, NAA: NOT DETECTED

## 2022-12-23 ENCOUNTER — Other Ambulatory Visit: Payer: Self-pay

## 2022-12-23 DIAGNOSIS — R051 Acute cough: Secondary | ICD-10-CM

## 2022-12-23 NOTE — Addendum Note (Signed)
Addended by: Leone Haven on: 12/23/2022 04:07 PM   Modules accepted: Orders

## 2022-12-24 ENCOUNTER — Ambulatory Visit (INDEPENDENT_AMBULATORY_CARE_PROVIDER_SITE_OTHER): Payer: Medicare Other

## 2022-12-24 ENCOUNTER — Other Ambulatory Visit: Payer: Medicare Other

## 2022-12-24 DIAGNOSIS — R059 Cough, unspecified: Secondary | ICD-10-CM | POA: Diagnosis not present

## 2022-12-24 DIAGNOSIS — R051 Acute cough: Secondary | ICD-10-CM | POA: Diagnosis not present

## 2022-12-26 ENCOUNTER — Telehealth: Payer: Self-pay | Admitting: Family Medicine

## 2022-12-26 NOTE — Telephone Encounter (Signed)
I called the patient and Lvm for her to call back to inform her her xray results have not been resulted. I left this information on her VM as well.  Shakala Marlatt,cma

## 2022-12-26 NOTE — Telephone Encounter (Signed)
Pt is calling about test results

## 2023-01-01 DIAGNOSIS — Z13820 Encounter for screening for osteoporosis: Secondary | ICD-10-CM | POA: Diagnosis not present

## 2023-01-01 DIAGNOSIS — M79642 Pain in left hand: Secondary | ICD-10-CM | POA: Diagnosis not present

## 2023-01-01 DIAGNOSIS — Z79899 Other long term (current) drug therapy: Secondary | ICD-10-CM | POA: Diagnosis not present

## 2023-01-01 DIAGNOSIS — M79641 Pain in right hand: Secondary | ICD-10-CM | POA: Diagnosis not present

## 2023-01-01 DIAGNOSIS — M0579 Rheumatoid arthritis with rheumatoid factor of multiple sites without organ or systems involvement: Secondary | ICD-10-CM | POA: Diagnosis not present

## 2023-01-02 DIAGNOSIS — M8588 Other specified disorders of bone density and structure, other site: Secondary | ICD-10-CM | POA: Diagnosis not present

## 2023-01-16 ENCOUNTER — Ambulatory Visit: Payer: Medicare Other | Admitting: Dermatology

## 2023-02-07 ENCOUNTER — Ambulatory Visit: Payer: Medicare Other | Admitting: Cardiovascular Disease

## 2023-02-10 ENCOUNTER — Other Ambulatory Visit: Payer: Self-pay | Admitting: Family Medicine

## 2023-02-10 DIAGNOSIS — F419 Anxiety disorder, unspecified: Secondary | ICD-10-CM

## 2023-03-19 ENCOUNTER — Encounter: Payer: Self-pay | Admitting: Family Medicine

## 2023-03-19 ENCOUNTER — Ambulatory Visit (INDEPENDENT_AMBULATORY_CARE_PROVIDER_SITE_OTHER): Payer: Medicare Other | Admitting: Family Medicine

## 2023-03-19 VITALS — BP 124/82 | HR 72 | Temp 98.3°F | Ht 63.0 in | Wt 191.4 lb

## 2023-03-19 DIAGNOSIS — R7303 Prediabetes: Secondary | ICD-10-CM | POA: Diagnosis not present

## 2023-03-19 DIAGNOSIS — I700 Atherosclerosis of aorta: Secondary | ICD-10-CM

## 2023-03-19 DIAGNOSIS — I10 Essential (primary) hypertension: Secondary | ICD-10-CM

## 2023-03-19 DIAGNOSIS — M069 Rheumatoid arthritis, unspecified: Secondary | ICD-10-CM

## 2023-03-19 DIAGNOSIS — M1712 Unilateral primary osteoarthritis, left knee: Secondary | ICD-10-CM | POA: Diagnosis not present

## 2023-03-19 DIAGNOSIS — M542 Cervicalgia: Secondary | ICD-10-CM

## 2023-03-19 DIAGNOSIS — Z6837 Body mass index (BMI) 37.0-37.9, adult: Secondary | ICD-10-CM

## 2023-03-19 LAB — BASIC METABOLIC PANEL
BUN: 21 mg/dL (ref 6–23)
CO2: 25 mEq/L (ref 19–32)
Calcium: 9.3 mg/dL (ref 8.4–10.5)
Chloride: 104 mEq/L (ref 96–112)
Creatinine, Ser: 0.83 mg/dL (ref 0.40–1.20)
GFR: 72.08 mL/min (ref >60.00)
Glucose, Bld: 92 mg/dL (ref 70–99)
Potassium: 3.7 mEq/L (ref 3.5–5.1)
Sodium: 140 mEq/L (ref 135–145)

## 2023-03-19 LAB — HEMOGLOBIN A1C: Hgb A1c MFr Bld: 5.6 % (ref 4.6–6.5)

## 2023-03-19 NOTE — Assessment & Plan Note (Signed)
Chronic issue status post replacement.  She has been evaluated by orthopedics.  Red light therapy seems to be beneficial.  I discussed that this would not necessarily cause any harm.

## 2023-03-19 NOTE — Assessment & Plan Note (Signed)
Chronic issue.  Encouraged healthy diet and exercise.  Patient has been on Ozempic.  Discussed that her insurance would not cover this for weight loss and the cost could be quite excessive.

## 2023-03-19 NOTE — Assessment & Plan Note (Signed)
Chronic issue.  Check A1c.  Encouraged healthy diet and exercise. 

## 2023-03-19 NOTE — Progress Notes (Signed)
Marikay Alar, MD Phone: [phone]  Cindy Stein is a 67 y.o. female who presents today for f/u.  HYPERTENSION Disease Monitoring Home BP Monitoring similar to today Chest pain- no    Dyspnea- no Medications Compliance-  taking coreg, olmesartan, HCTZ.  Edema- no BMET    Component Value Date/Time   NA 141 06/18/2022 0941   K 4.0 06/18/2022 0941   CL 103 06/18/2022 0941   CO2 25 06/18/2022 0941   GLUCOSE 98 06/18/2022 0941   GLUCOSE 99 06/05/2022 1302   BUN 19 06/18/2022 0941   CREATININE 0.78 06/18/2022 0941   CALCIUM 9.1 06/18/2022 0941   GFRNONAA >60 06/05/2022 1302   GFRAA 38 (L) 04/26/2020 1646   The 10-year ASCVD risk score (Arnett DK, et al., 2019) is: 9.5%   Values used to calculate the score:     Age: 37 years     Sex: Female     Is Non-Hispanic African American: No     Diabetic: No     Tobacco smoker: No     Systolic Blood Pressure: 124 mmHg     Is BP treated: Yes     HDL Cholesterol: 70 mg/dL     Total Cholesterol: 169 mg/dL  Prediabetes: Patient notes she has been eating normal meals though eating smaller portions.  She does stay active and gets on a bike occasionally.  Obesity: Patient notes she has been taking Ozempic that her daughter's been getting online.  She is on 1 mg weekly.  She notes minimal nausea.  She does not have a gallbladder.  She notes no history of pancreatitis.  No personal or family history of thyroid cancer, parathyroid cancer, or adrenal gland cancer.  Chronic left knee pain: Patient is status post replacement.  She has seen orthopedics and had imaging and they noted there was no issue with the replacement.  She has been doing red light therapy to help with her RA and that has helped with her knee pain as well.  RA: She is not sure the Enbrel is working.  She sees her specialist in the next month or so.  Right neck pain: Patient notes right lateral neck pain over the last week.  Has been getting better.  She notes no injury.   No sore throat with this.  Social History   Tobacco Use  Smoking Status Never  Smokeless Tobacco Never    Current Outpatient Medications on File Prior to Visit  Medication Sig Dispense Refill   aspirin EC 81 MG tablet Take 81 mg by mouth in the morning. Swallow whole.     carvedilol (COREG) 6.25 MG tablet TAKE 1 TABLET BY MOUTH TWICE A DAY 180 tablet 1   doxycycline (VIBRA-TABS) 100 MG tablet Take 1 tablet (100 mg total) by mouth 2 (two) times daily. 14 tablet 0   etanercept (ENBREL) 50 MG/ML injection Inject into the skin.     methotrexate (RHEUMATREX) 2.5 MG tablet Take 20 mg by mouth once a week.     naproxen sodium (ALEVE) 220 MG tablet Take 220 mg by mouth daily as needed (pain.).     olmesartan-hydrochlorothiazide (BENICAR HCT) 20-12.5 MG tablet Take 1 tablet by mouth daily. 90 tablet 1   ondansetron (ZOFRAN-ODT) 4 MG disintegrating tablet Take 1 tablet (4 mg total) by mouth every 8 (eight) hours as needed for nausea or vomiting. 15 tablet 0   sertraline (ZOLOFT) 50 MG tablet TAKE ONE TABLET BY MOUTH DAILY 90 tablet 0   pantoprazole (PROTONIX) 40  MG tablet Take 1 tablet (40 mg total) by mouth daily. 30 tablet 2   No current facility-administered medications on file prior to visit.     ROS see history of present illness  Objective  Physical Exam Vitals:   03/19/23 0809  BP: 124/82  Pulse: 72  Temp: 98.3 F (36.8 C)  SpO2: 98%    BP Readings from Last 3 Encounters:  03/19/23 124/82  12/16/22 110/70  09/11/22 90/60   Wt Readings from Last 3 Encounters:  03/19/23 191 lb 6.4 oz (86.8 kg)  12/16/22 202 lb (91.6 kg)  09/11/22 197 lb 3.2 oz (89.4 kg)    Physical Exam Constitutional:      General: She is not in acute distress.    Appearance: She is not diaphoretic.  Neck:   Cardiovascular:     Rate and Rhythm: Normal rate and regular rhythm.     Heart sounds: Normal heart sounds.  Pulmonary:     Effort: Pulmonary effort is normal.     Breath sounds: Normal  breath sounds.  Skin:    General: Skin is warm and dry.  Neurological:     Mental Status: She is alert.      Assessment/Plan: Please see individual problem list.  Primary hypertension Assessment & Plan: Chronic issue.  Adequately controlled.  She will continue carvedilol 6.25 mg twice daily and olmesartan-HCTZ 1 tablet daily.  Orders: -     Basic metabolic panel  Rheumatoid arthritis involving both hands, unspecified whether rheumatoid factor present Assessment & Plan: Chronic issue.  I encouraged her to follow-up with her rheumatologist to discuss if the Enbrel is the appropriate medication for her.   Osteoarthritis of left knee, unspecified osteoarthritis type Assessment & Plan: Chronic issue status post replacement.  She has been evaluated by orthopedics.  Red light therapy seems to be beneficial.  I discussed that this would not necessarily cause any harm.   Class 2 severe obesity due to excess calories with serious comorbidity and body mass index (BMI) of 37.0 to 37.9 in adult Assessment & Plan: Chronic issue.  Encouraged healthy diet and exercise.  Patient has been on Ozempic.  Discussed that her insurance would not cover this for weight loss and the cost could be quite excessive.   Prediabetes Assessment & Plan: Chronic issue.  Check A1c.  Encouraged healthy diet and exercise.  Orders: -     Hemoglobin A1c  Neck pain on right side Assessment & Plan: Suspect muscular strain.  She will monitor and if not continuing to improve she will let us know.   Aortic atherosclerosis Assessment & Plan: Minimal on prior CT imaging.  Continue risk factor management.     Return in about 6 months (around 09/18/2023).   Marikay Alar, MD [facility]

## 2023-03-19 NOTE — Patient Instructions (Signed)
If your neck discomfort is not improving please let us know.

## 2023-03-19 NOTE — Assessment & Plan Note (Signed)
Suspect muscular strain.  She will monitor and if not continuing to improve she will let us know.

## 2023-03-19 NOTE — Assessment & Plan Note (Signed)
Chronic issue.  I encouraged her to follow-up with her rheumatologist to discuss if the Enbrel is the appropriate medication for her.

## 2023-03-19 NOTE — Assessment & Plan Note (Signed)
Minimal on prior CT imaging.  Continue risk factor management.

## 2023-03-19 NOTE — Assessment & Plan Note (Signed)
Chronic issue.  Adequately controlled.  She will continue carvedilol 6.25 mg twice daily and olmesartan-HCTZ 1 tablet daily.

## 2023-04-09 ENCOUNTER — Ambulatory Visit: Payer: Self-pay

## 2023-04-09 NOTE — Patient Outreach (Signed)
  Care Coordination   04/09/2023 Name: Remi Maekawa MRN: [id number] DOB: [birth year]   Care Coordination Outreach Attempts:  An unsuccessful telephone outreach was attempted today to offer the patient information about available care coordination services.  Follow Up Plan:  Additional outreach attempts will be made to offer the patient care coordination information and services.   Encounter Outcome:  No Answer   Care Coordination Interventions:  No, not indicated    SIG Lysle Morales, BSW Social Worker [facility] Care Management  [phone]

## 2023-04-21 DIAGNOSIS — Z79899 Other long term (current) drug therapy: Secondary | ICD-10-CM | POA: Diagnosis not present

## 2023-04-21 DIAGNOSIS — M0579 Rheumatoid arthritis with rheumatoid factor of multiple sites without organ or systems involvement: Secondary | ICD-10-CM | POA: Diagnosis not present

## 2023-04-23 NOTE — Progress Notes (Unsigned)
Cardiology Office Note   Date:  04/24/2023   ID:  Cindy Stein, DOB 12/07/1955, MRN [id number]  PCP:  Glori Luis, MD  Cardiologist:   Lorine Bears, MD   Chief Complaint  Patient presents with   Follow-up    12 Month f/u no complaints today. Meds reviewed verbally with pt.      History of Present Illness: Cindy Stein is a 67 y.o. female who presents for a follow-up visit regarding chest pain and palpitations.    She has known history of essential hypertension, hyperlipidemia and obesity.  She also has strong family history of coronary artery disease affecting almost every family member.    Due to recurrent atypical chest pain, she underwent cardiac catheterization in July 2020 which showed normal coronary arteries, normal LV systolic function and mildly elevated left ventricular end-diastolic pressure.    She underwent a 7-day ZIO patch monitor which showed sinus rhythm with no evidence of PVCs.  She had 1 short run of SVT that lasted 5 beats.  She had multiple surgeries done including right knee replacement, IVC filter placement with subsequent removal, eye surgery and hysterectomy.   She had atypical chest pain in 2022.  Lexiscan Myoview showed no evidence of ischemia.  Echocardiogram showed normal LV systolic function with no significant valvular abnormalities. Her symptoms improved significantly with the addition of sertraline.   She has been doing very well with no recent chest pain, shortness of breath or palpitations.  Her blood pressure has been controlled.   Past Medical History:  Diagnosis Date   Anxiety    Atypical chest pain    DOE (dyspnea on exertion)    DVT of lower extremity (deep venous thrombosis) (HCC) 03/20/2020   a.) LEFT femoral, popliteal, posterior tibial, and paroneal veins; Tx'd with Rivaroxaban; b.) embolized on 03/21/2020 requiring PA thrombolysis and mechanical thrombectomy on 03/22/2020   Grade I diastolic dysfunction 03/21/2020    History of immunosuppression    long-term MTX for RA diagnosis   History of PSVT (paroxysmal supraventricular tachycardia) 07/22/2019   a.) brief 5 beat run noted on Zio   HLD (hyperlipidemia)    Hypertension    Kidney [place]    [place]   Osteoarthritis    Plantar fascial fibromatosis    Pulmonary embolus (HCC) 03/21/2020   a.) BILATERAL; required PA thrombolysis and mechanical thrombectomy on 03/22/2020; b.) IVC filter placed prior to knee surgery on 12/12/2020 and removed on 04/19/2021   PVC (premature ventricular contraction)    Rheumatoid arthritis (HCC)    Valgus deformity of great toes, bilateral    Vitamin D deficiency     Past Surgical History:  Procedure Laterality Date   ANTERIOR AND POSTERIOR REPAIR N/A 07/23/2021   Procedure: ANTERIOR (CYSTOCELE) AND POSTERIOR REPAIR (RECTOCELE);  Surgeon: Linzie Collin, MD;  Location: [facility] ORS;  Service: Gynecology;  Laterality: N/A;   CARDIAC CATHETERIZATION  2006   carpel tunnel Bilateral    CATARACT EXTRACTION, BILATERAL     CHOLECYSTECTOMY N/A 2006   COLONOSCOPY WITH PROPOFOL N/A 06/20/2022   Procedure: COLONOSCOPY WITH PROPOFOL;  Surgeon: Wyline Mood, MD;  Location: [facility] ENDOSCOPY;  Service: Gastroenterology;  Laterality: N/A;   IVC FILTER INSERTION N/A 12/11/2020   Procedure: IVC FILTER INSERTION;  Surgeon: Annice Needy, MD;  Location: [facility] INVASIVE CV LAB;  Service: Cardiovascular;  Laterality: N/A;   IVC FILTER REMOVAL N/A 04/19/2021   Procedure: IVC FILTER REMOVAL;  Surgeon: Annice Needy, MD;  Location: [facility]  INVASIVE CV LAB;  Service: Cardiovascular;  Laterality: N/A;   JOINT REPLACEMENT Right 2021   KNEE   LEFT HEART CATH AND CORONARY ANGIOGRAPHY Left 06/14/2019   Procedure: LEFT HEART CATH AND CORONARY ANGIOGRAPHY;  Surgeon: Iran Ouch, MD;  Location: [facility] INVASIVE CV LAB;  Service: Cardiovascular;  Laterality: Left;   PUBOVAGINAL SLING N/A 07/23/2021   Procedure: Leonides Grills;  Surgeon: Linzie Collin, MD;  Location: [facility] ORS;  Service: Gynecology;  Laterality: N/A;   PULMONARY THROMBECTOMY Bilateral 03/22/2020   Procedure: PULMONARY THROMBECTOMY / THROMBOLYSIS;  Surgeon: Annice Needy, MD;  Location: [facility] INVASIVE CV LAB;  Service: Cardiovascular;  Laterality: Bilateral;   REPLACEMENT TOTAL KNEE Left 2022   VAGINAL HYSTERECTOMY N/A 07/23/2021   Procedure: HYSTERECTOMY VAGINAL;  Surgeon: Linzie Collin, MD;  Location: [facility] ORS;  Service: Gynecology;  Laterality: N/A;     Current Outpatient Medications  Medication Sig Dispense Refill   aspirin EC 81 MG tablet Take 81 mg by mouth in the morning. Swallow whole.     carvedilol (COREG) 6.25 MG tablet TAKE 1 TABLET BY MOUTH TWICE A DAY 180 tablet 1   etanercept (ENBREL) 50 MG/ML injection Inject into the skin.     methotrexate (RHEUMATREX) 2.5 MG tablet Take 20 mg by mouth once a week.     naproxen sodium (ALEVE) 220 MG tablet Take 220 mg by mouth daily as needed (pain.).     olmesartan-hydrochlorothiazide (BENICAR HCT) 20-12.5 MG tablet Take 1 tablet by mouth daily. 90 tablet 1   ondansetron (ZOFRAN-ODT) 4 MG disintegrating tablet Take 1 tablet (4 mg total) by mouth every 8 (eight) hours as needed for nausea or vomiting. 15 tablet 0   pantoprazole (PROTONIX) 40 MG tablet Take 1 tablet (40 mg total) by mouth daily. 30 tablet 2   sertraline (ZOLOFT) 50 MG tablet TAKE ONE TABLET BY MOUTH DAILY 90 tablet 0   No current facility-administered medications for this visit.    Allergies:   Acetaminophen and Tramadol    Social History:  The patient  reports that she has never smoked. She has never used smokeless tobacco. She reports that she does not currently use alcohol. She reports that she does not use drugs.   Family History:  The patient's family history includes Asthma in her father and mother; Heart failure in her father; Leukemia in her sister.    ROS:  Please see the history of present illness.   Otherwise, review of  systems are positive for none.   All other systems are reviewed and negative.    PHYSICAL EXAM: VS:  BP 110/64 (BP Location: Left Arm, Patient Position: Sitting, Cuff Size: Normal)   Pulse 70   Ht 5\' 2"  (1.575 m)   Wt 191 lb 4 oz (86.8 kg)   SpO2 98%   BMI 34.98 kg/m  , BMI Body mass index is 34.98 kg/m. GEN: Well nourished, well developed, in no acute distress  HEENT: normal  Neck: no JVD, carotid bruits, or masses Cardiac: RRR; no murmurs, rubs, or gallops,no edema .   Respiratory:  clear to auscultation bilaterally, normal work of breathing GI: soft, nontender, nondistended, + BS MS: no deformity or atrophy  Skin: warm and dry, no rash Neuro:  Strength and sensation are intact Psych: euthymic mood, full affect   EKG:  EKG is  ordered today. EKG showed s normal sinus rhythm with low voltage.  Nonspecific T wave changes.   Recent Labs: 06/18/2022: Hemoglobin 13.6; Platelets 184;  TSH 2.770 12/16/2022: ALT 27 03/19/2023: BUN 21; Creatinine, Ser 0.83; Potassium 3.7; Sodium 140    Lipid Panel    Component Value Date/Time   CHOL 169 06/18/2022 0941   TRIG 80 06/18/2022 0941   HDL 70 06/18/2022 0941   CHOLHDL 2.4 06/18/2022 0941   CHOLHDL 3 07/09/2021 1601   VLDL 43.0 (H) 07/09/2021 1601   LDLCALC 84 06/18/2022 0941   LDLDIRECT 85.0 07/09/2021 1601      Wt Readings from Last 3 Encounters:  04/24/23 191 lb 4 oz (86.8 kg)  03/19/23 191 lb 6.4 oz (86.8 kg)  12/16/22 202 lb (91.6 kg)         06/03/2019   10:25 AM  PAD Screen  Previous PAD dx? No  Previous surgical procedure? No  Pain with walking? No  Feet/toe relief with dangling? No  Painful, non-healing ulcers? No  Extremities discolored? No      ASSESSMENT AND PLAN:  1.  History of atypical chest pain: Negative cardiac work-up in the past including cardiac catheterization in 2020 which showed normal coronary arteries.  No recurrent chest pain since her anxiety was treated.   2.  Palpitations with  intermittent tachycardia: Suspect likely sinus tachycardia.  Her symptoms are well controlled with carvedilol.  3.  Essential hypertension: Blood pressure is well controlled on current medications.  4.  Stress and anxiety: Significant improvement with sertraline.  5.  Rheumatoid arthritis: She reports that her symptoms are well-controlled with Enbrel and methotrexate.    Disposition:   FU with me in 12 months.  Signed,  Lorine Bears, MD  04/24/2023 9:02 AM    [facility]

## 2023-04-24 ENCOUNTER — Ambulatory Visit: Payer: Medicare Other | Attending: Cardiovascular Disease | Admitting: Cardiovascular Disease

## 2023-04-24 ENCOUNTER — Encounter: Payer: Self-pay | Admitting: Cardiovascular Disease

## 2023-04-24 VITALS — BP 110/64 | HR 70 | Ht 62.0 in | Wt 191.2 lb

## 2023-04-24 DIAGNOSIS — I10 Essential (primary) hypertension: Secondary | ICD-10-CM

## 2023-04-24 DIAGNOSIS — R0789 Other chest pain: Secondary | ICD-10-CM

## 2023-04-24 DIAGNOSIS — R002 Palpitations: Secondary | ICD-10-CM | POA: Diagnosis not present

## 2023-04-24 NOTE — Patient Instructions (Signed)
Medication Instructions:  No changes *If you need a refill on your cardiac medications before your next appointment, please call your pharmacy*   Lab Work: None ordered If you have labs (blood work) drawn today and your tests are completely normal, you will receive your results only by: MyChart Message (if you have MyChart) OR A paper copy in the mail If you have any lab test that is abnormal or we need to change your treatment, we will call you to review the results.   Testing/Procedures: None ordered   Follow-Up: At [facility], you and your health needs are our priority.  As part of our continuing mission to provide you with exceptional heart care, we have created designated Provider Care Teams.  These Care Teams include your primary Cardiologist (physician) and Advanced Practice Providers (APPs -  Physician Assistants and Nurse Practitioners) who all work together to provide you with the care you need, when you need it.  We recommend signing up for the patient portal called "MyChart".  Sign up information is provided on this After Visit Summary.  MyChart is used to connect with patients for Virtual Visits (Telemedicine).  Patients are able to view lab/test results, encounter notes, upcoming appointments, etc.  Non-urgent messages can be sent to your provider as well.   To learn more about what you can do with MyChart, go to [email or web address].    Your next appointment:   12 month(s)  Provider:   You may see Muhammad Arida, MD or one of the following Advanced Practice Providers on your designated Care Team:   Christopher Berge, NP Ryan Dunn, PA-C Cadence Furth, PA-C Sheri Hammock, NP    

## 2023-04-29 ENCOUNTER — Other Ambulatory Visit: Payer: Self-pay | Admitting: Family Medicine

## 2023-04-29 DIAGNOSIS — I10 Essential (primary) hypertension: Secondary | ICD-10-CM

## 2023-05-05 DIAGNOSIS — Z961 Presence of intraocular lens: Secondary | ICD-10-CM | POA: Diagnosis not present

## 2023-05-14 ENCOUNTER — Other Ambulatory Visit: Payer: Self-pay | Admitting: Family Medicine

## 2023-05-14 DIAGNOSIS — F419 Anxiety disorder, unspecified: Secondary | ICD-10-CM

## 2023-05-15 DIAGNOSIS — M9903 Segmental and somatic dysfunction of lumbar region: Secondary | ICD-10-CM | POA: Diagnosis not present

## 2023-05-15 DIAGNOSIS — M9905 Segmental and somatic dysfunction of pelvic region: Secondary | ICD-10-CM | POA: Diagnosis not present

## 2023-05-15 DIAGNOSIS — M5416 Radiculopathy, lumbar region: Secondary | ICD-10-CM | POA: Diagnosis not present

## 2023-05-15 DIAGNOSIS — M5417 Radiculopathy, lumbosacral region: Secondary | ICD-10-CM | POA: Diagnosis not present

## 2023-05-16 DIAGNOSIS — M9905 Segmental and somatic dysfunction of pelvic region: Secondary | ICD-10-CM | POA: Diagnosis not present

## 2023-05-16 DIAGNOSIS — M5417 Radiculopathy, lumbosacral region: Secondary | ICD-10-CM | POA: Diagnosis not present

## 2023-05-16 DIAGNOSIS — M5416 Radiculopathy, lumbar region: Secondary | ICD-10-CM | POA: Diagnosis not present

## 2023-05-16 DIAGNOSIS — M9903 Segmental and somatic dysfunction of lumbar region: Secondary | ICD-10-CM | POA: Diagnosis not present

## 2023-05-19 DIAGNOSIS — M5417 Radiculopathy, lumbosacral region: Secondary | ICD-10-CM | POA: Diagnosis not present

## 2023-05-19 DIAGNOSIS — M9905 Segmental and somatic dysfunction of pelvic region: Secondary | ICD-10-CM | POA: Diagnosis not present

## 2023-05-19 DIAGNOSIS — M5416 Radiculopathy, lumbar region: Secondary | ICD-10-CM | POA: Diagnosis not present

## 2023-05-19 DIAGNOSIS — M9903 Segmental and somatic dysfunction of lumbar region: Secondary | ICD-10-CM | POA: Diagnosis not present

## 2023-05-21 DIAGNOSIS — M9903 Segmental and somatic dysfunction of lumbar region: Secondary | ICD-10-CM | POA: Diagnosis not present

## 2023-05-21 DIAGNOSIS — M5417 Radiculopathy, lumbosacral region: Secondary | ICD-10-CM | POA: Diagnosis not present

## 2023-05-21 DIAGNOSIS — M5416 Radiculopathy, lumbar region: Secondary | ICD-10-CM | POA: Diagnosis not present

## 2023-05-21 DIAGNOSIS — M9905 Segmental and somatic dysfunction of pelvic region: Secondary | ICD-10-CM | POA: Diagnosis not present

## 2023-05-23 DIAGNOSIS — M9903 Segmental and somatic dysfunction of lumbar region: Secondary | ICD-10-CM | POA: Diagnosis not present

## 2023-05-23 DIAGNOSIS — M5416 Radiculopathy, lumbar region: Secondary | ICD-10-CM | POA: Diagnosis not present

## 2023-05-23 DIAGNOSIS — M9905 Segmental and somatic dysfunction of pelvic region: Secondary | ICD-10-CM | POA: Diagnosis not present

## 2023-05-23 DIAGNOSIS — M5417 Radiculopathy, lumbosacral region: Secondary | ICD-10-CM | POA: Diagnosis not present

## 2023-05-26 DIAGNOSIS — M5417 Radiculopathy, lumbosacral region: Secondary | ICD-10-CM | POA: Diagnosis not present

## 2023-05-26 DIAGNOSIS — M5416 Radiculopathy, lumbar region: Secondary | ICD-10-CM | POA: Diagnosis not present

## 2023-05-26 DIAGNOSIS — M9903 Segmental and somatic dysfunction of lumbar region: Secondary | ICD-10-CM | POA: Diagnosis not present

## 2023-05-26 DIAGNOSIS — M9905 Segmental and somatic dysfunction of pelvic region: Secondary | ICD-10-CM | POA: Diagnosis not present

## 2023-05-28 DIAGNOSIS — M9903 Segmental and somatic dysfunction of lumbar region: Secondary | ICD-10-CM | POA: Diagnosis not present

## 2023-05-28 DIAGNOSIS — M9905 Segmental and somatic dysfunction of pelvic region: Secondary | ICD-10-CM | POA: Diagnosis not present

## 2023-05-28 DIAGNOSIS — M5416 Radiculopathy, lumbar region: Secondary | ICD-10-CM | POA: Diagnosis not present

## 2023-05-28 DIAGNOSIS — M5417 Radiculopathy, lumbosacral region: Secondary | ICD-10-CM | POA: Diagnosis not present

## 2023-05-29 DIAGNOSIS — M5417 Radiculopathy, lumbosacral region: Secondary | ICD-10-CM | POA: Diagnosis not present

## 2023-05-29 DIAGNOSIS — M9905 Segmental and somatic dysfunction of pelvic region: Secondary | ICD-10-CM | POA: Diagnosis not present

## 2023-05-29 DIAGNOSIS — M9903 Segmental and somatic dysfunction of lumbar region: Secondary | ICD-10-CM | POA: Diagnosis not present

## 2023-05-29 DIAGNOSIS — M5416 Radiculopathy, lumbar region: Secondary | ICD-10-CM | POA: Diagnosis not present

## 2023-06-02 DIAGNOSIS — M5417 Radiculopathy, lumbosacral region: Secondary | ICD-10-CM | POA: Diagnosis not present

## 2023-06-02 DIAGNOSIS — M9905 Segmental and somatic dysfunction of pelvic region: Secondary | ICD-10-CM | POA: Diagnosis not present

## 2023-06-02 DIAGNOSIS — M9903 Segmental and somatic dysfunction of lumbar region: Secondary | ICD-10-CM | POA: Diagnosis not present

## 2023-06-02 DIAGNOSIS — M5416 Radiculopathy, lumbar region: Secondary | ICD-10-CM | POA: Diagnosis not present

## 2023-06-03 DIAGNOSIS — M9903 Segmental and somatic dysfunction of lumbar region: Secondary | ICD-10-CM | POA: Diagnosis not present

## 2023-06-03 DIAGNOSIS — M9905 Segmental and somatic dysfunction of pelvic region: Secondary | ICD-10-CM | POA: Diagnosis not present

## 2023-06-03 DIAGNOSIS — M5417 Radiculopathy, lumbosacral region: Secondary | ICD-10-CM | POA: Diagnosis not present

## 2023-06-03 DIAGNOSIS — M5416 Radiculopathy, lumbar region: Secondary | ICD-10-CM | POA: Diagnosis not present

## 2023-06-09 DIAGNOSIS — M9905 Segmental and somatic dysfunction of pelvic region: Secondary | ICD-10-CM | POA: Diagnosis not present

## 2023-06-09 DIAGNOSIS — M5416 Radiculopathy, lumbar region: Secondary | ICD-10-CM | POA: Diagnosis not present

## 2023-06-09 DIAGNOSIS — M9903 Segmental and somatic dysfunction of lumbar region: Secondary | ICD-10-CM | POA: Diagnosis not present

## 2023-06-09 DIAGNOSIS — M5417 Radiculopathy, lumbosacral region: Secondary | ICD-10-CM | POA: Diagnosis not present

## 2023-06-11 DIAGNOSIS — M5416 Radiculopathy, lumbar region: Secondary | ICD-10-CM | POA: Diagnosis not present

## 2023-06-11 DIAGNOSIS — M9903 Segmental and somatic dysfunction of lumbar region: Secondary | ICD-10-CM | POA: Diagnosis not present

## 2023-06-11 DIAGNOSIS — M5417 Radiculopathy, lumbosacral region: Secondary | ICD-10-CM | POA: Diagnosis not present

## 2023-06-11 DIAGNOSIS — M9905 Segmental and somatic dysfunction of pelvic region: Secondary | ICD-10-CM | POA: Diagnosis not present

## 2023-06-16 DIAGNOSIS — M9905 Segmental and somatic dysfunction of pelvic region: Secondary | ICD-10-CM | POA: Diagnosis not present

## 2023-06-16 DIAGNOSIS — H60543 Acute eczematoid otitis externa, bilateral: Secondary | ICD-10-CM | POA: Diagnosis not present

## 2023-06-16 DIAGNOSIS — M5417 Radiculopathy, lumbosacral region: Secondary | ICD-10-CM | POA: Diagnosis not present

## 2023-06-16 DIAGNOSIS — M9903 Segmental and somatic dysfunction of lumbar region: Secondary | ICD-10-CM | POA: Diagnosis not present

## 2023-06-16 DIAGNOSIS — M5416 Radiculopathy, lumbar region: Secondary | ICD-10-CM | POA: Diagnosis not present

## 2023-06-18 DIAGNOSIS — M5417 Radiculopathy, lumbosacral region: Secondary | ICD-10-CM | POA: Diagnosis not present

## 2023-06-18 DIAGNOSIS — M9905 Segmental and somatic dysfunction of pelvic region: Secondary | ICD-10-CM | POA: Diagnosis not present

## 2023-06-18 DIAGNOSIS — M5416 Radiculopathy, lumbar region: Secondary | ICD-10-CM | POA: Diagnosis not present

## 2023-06-18 DIAGNOSIS — M9903 Segmental and somatic dysfunction of lumbar region: Secondary | ICD-10-CM | POA: Diagnosis not present

## 2023-06-23 DIAGNOSIS — M9905 Segmental and somatic dysfunction of pelvic region: Secondary | ICD-10-CM | POA: Diagnosis not present

## 2023-06-23 DIAGNOSIS — M9903 Segmental and somatic dysfunction of lumbar region: Secondary | ICD-10-CM | POA: Diagnosis not present

## 2023-06-23 DIAGNOSIS — M5416 Radiculopathy, lumbar region: Secondary | ICD-10-CM | POA: Diagnosis not present

## 2023-06-23 DIAGNOSIS — M5417 Radiculopathy, lumbosacral region: Secondary | ICD-10-CM | POA: Diagnosis not present

## 2023-06-25 DIAGNOSIS — M5416 Radiculopathy, lumbar region: Secondary | ICD-10-CM | POA: Diagnosis not present

## 2023-06-25 DIAGNOSIS — M9903 Segmental and somatic dysfunction of lumbar region: Secondary | ICD-10-CM | POA: Diagnosis not present

## 2023-06-25 DIAGNOSIS — M5417 Radiculopathy, lumbosacral region: Secondary | ICD-10-CM | POA: Diagnosis not present

## 2023-06-25 DIAGNOSIS — M9905 Segmental and somatic dysfunction of pelvic region: Secondary | ICD-10-CM | POA: Diagnosis not present

## 2023-07-02 DIAGNOSIS — M9903 Segmental and somatic dysfunction of lumbar region: Secondary | ICD-10-CM | POA: Diagnosis not present

## 2023-07-02 DIAGNOSIS — M5416 Radiculopathy, lumbar region: Secondary | ICD-10-CM | POA: Diagnosis not present

## 2023-07-02 DIAGNOSIS — M9905 Segmental and somatic dysfunction of pelvic region: Secondary | ICD-10-CM | POA: Diagnosis not present

## 2023-07-02 DIAGNOSIS — M5417 Radiculopathy, lumbosacral region: Secondary | ICD-10-CM | POA: Diagnosis not present

## 2023-07-09 DIAGNOSIS — M9905 Segmental and somatic dysfunction of pelvic region: Secondary | ICD-10-CM | POA: Diagnosis not present

## 2023-07-09 DIAGNOSIS — M5416 Radiculopathy, lumbar region: Secondary | ICD-10-CM | POA: Diagnosis not present

## 2023-07-09 DIAGNOSIS — M5417 Radiculopathy, lumbosacral region: Secondary | ICD-10-CM | POA: Diagnosis not present

## 2023-07-09 DIAGNOSIS — M9903 Segmental and somatic dysfunction of lumbar region: Secondary | ICD-10-CM | POA: Diagnosis not present

## 2023-07-14 DIAGNOSIS — H60543 Acute eczematoid otitis externa, bilateral: Secondary | ICD-10-CM | POA: Diagnosis not present

## 2023-07-15 ENCOUNTER — Ambulatory Visit (INDEPENDENT_AMBULATORY_CARE_PROVIDER_SITE_OTHER): Payer: Self-pay | Admitting: Dermatology

## 2023-07-15 VITALS — BP 114/79 | HR 81

## 2023-07-15 DIAGNOSIS — L988 Other specified disorders of the skin and subcutaneous tissue: Secondary | ICD-10-CM

## 2023-07-15 NOTE — Progress Notes (Signed)
   Follow-Up Visit   Subjective  Cindy Stein is a 67 y.o. female who presents for the following: Botox for facial elastosis  The following portions of the chart were reviewed this encounter and updated as appropriate: medications, allergies, medical history  Review of Systems:  No other skin or systemic complaints except as noted in HPI or Assessment and Plan.  Objective  Well appearing patient in no apparent distress; mood and affect are within normal limits.  A focused examination was performed of the face.  Relevant physical exam findings are noted in the Assessment and Plan.  Before botox photos if first time, Injection map photo    Assessment & Plan    Facial Elastosis  Location:                  Informed consent: Discussed risks (infection, pain, bleeding, bruising, swelling, allergic reaction, paralysis of nearby muscles, eyelid droop, double vision, neck weakness, difficulty breathing, headache, undesirable cosmetic result, and need for additional treatment) and benefits of the procedure, as well as the alternatives.  Informed consent was obtained.  Preparation: The area was cleansed with alcohol.  Procedure Details:  Botox was injected into the dermis with a 30-gauge needle. Pressure applied to any bleeding. Ice packs offered for swelling.  Lot Number:  [id number]  Expiration:  08/2025  Total Units Injected:  65.5  Plan: Tylenol may be used for headache.  Allow 2 weeks before returning to clinic for additional dosing as needed. Patient will call for any problems.   Prior to the procedure, the patient's past medical history, allergies and the rare but potential risks and complications were reviewed with the patient and a signed consent was obtained. Pre and post-treatment care was discussed and instructions provided.  Location: [facility]  Filler Type: Restylane Defyne Lot #[id number] Exp 08/31/2024  Procedure: The area was prepped thoroughly  with Puracyn. After introducing the needle into the desired treatment area, the syringe plunger was drawn back to ensure there was no flash of blood prior to injecting the filler in order to minimize risk of intravascular injection and vascular occlusion. After injection of the filler, the treated areas were cleansed and iced to reduce swelling. Post-treatment instructions were reviewed with the patient.       Patient tolerated the procedure well. The patient will call with any problems, questions or concerns prior to their next appointment.  No follow-ups on file.  IAngelique Holm, CMA, am acting as scribe for Armida Sans, MD .   Documentation: I have reviewed the above documentation for accuracy and completeness, and I agree with the above.  Armida Sans, MD

## 2023-07-15 NOTE — Patient Instructions (Signed)

## 2023-07-16 ENCOUNTER — Ambulatory Visit (INDEPENDENT_AMBULATORY_CARE_PROVIDER_SITE_OTHER): Payer: Medicare Other | Admitting: *Deleted

## 2023-07-16 VITALS — Ht 63.0 in | Wt 190.0 lb

## 2023-07-16 DIAGNOSIS — Z0000 Encounter for general adult medical examination without abnormal findings: Secondary | ICD-10-CM

## 2023-07-16 NOTE — Progress Notes (Signed)
Subjective:   Cindy Stein is a 67 y.o. female who presents for Medicare Annual (Subsequent) preventive examination.  Visit Complete: Virtual  I connected with  [name] on 07/16/23 by a audio enabled telemedicine application and verified that I am speaking with the correct person using two identifiers.  Patient Location: Home  Provider Location: Office/Clinic  I discussed the limitations of evaluation and management by telemedicine. The patient expressed understanding and agreed to proceed.  .Vital Signs: Unable to obtain new vitals due to this being a telehealth visit.  Review of Systems     Cardiac Risk Factors include: advanced age (>44men, >20 women);hypertension;obesity (BMI >30kg/m2);Other (see comment), Risk factor comments: PVC     Objective:    Today's Vitals   07/16/23 1522  Weight: 190 lb (86.2 kg)  Height: 5\' 3"  (1.6 m)   Body mass index is 33.66 kg/m.     07/16/2023    3:33 PM 07/09/2022    9:50 AM 06/20/2022    9:44 AM 06/05/2022    1:38 PM 08/01/2021   10:31 AM 07/13/2021    2:26 PM 06/15/2021    2:05 PM  Advanced Directives  Does Patient Have a Medical Advance Directive? Yes Yes Yes No No Yes Yes  Type of Estate agent of [place];Living will Healthcare Power of [place];Living will Living will;Healthcare Power of Attorney   Living will Living will;Healthcare Power of Attorney  Does patient want to make changes to medical advance directive?  No - Patient declined     No - Patient declined  Copy of Healthcare Power of Attorney in Chart? No - copy requested No - copy requested No - copy requested    No - copy requested    Current Medications (verified) Outpatient Encounter Medications as of 07/16/2023  Medication Sig   aspirin EC 81 MG tablet Take 81 mg by mouth in the morning. Swallow whole.   carvedilol (COREG) 6.25 MG tablet TAKE ONE TABLET BY MOUTH TWICE A DAY   etanercept (ENBREL) 50 MG/ML injection Inject into the skin.    methotrexate (RHEUMATREX) 2.5 MG tablet Take 20 mg by mouth once a week.   naproxen sodium (ALEVE) 220 MG tablet Take 220 mg by mouth daily as needed (pain.).   olmesartan-hydrochlorothiazide (BENICAR HCT) 20-12.5 MG tablet TAKE ONE TABLET BY MOUTH ONCE A DAY   ondansetron (ZOFRAN-ODT) 4 MG disintegrating tablet Take 1 tablet (4 mg total) by mouth every 8 (eight) hours as needed for nausea or vomiting.   sertraline (ZOLOFT) 50 MG tablet TAKE ONE TABLET BY MOUTH DAILY   pantoprazole (PROTONIX) 40 MG tablet Take 1 tablet (40 mg total) by mouth daily.   No facility-administered encounter medications on file as of 07/16/2023.    Allergies (verified) Acetaminophen and Tramadol   History: Past Medical History:  Diagnosis Date   Anxiety    Atypical chest pain    DOE (dyspnea on exertion)    DVT of lower extremity (deep venous thrombosis) (HCC) 03/20/2020   a.) LEFT femoral, popliteal, posterior tibial, and paroneal veins; Tx'd with Rivaroxaban; b.) embolized on 03/21/2020 requiring PA thrombolysis and mechanical thrombectomy on 03/22/2020   Grade I diastolic dysfunction 03/21/2020   History of immunosuppression    long-term MTX for RA diagnosis   History of PSVT (paroxysmal supraventricular tachycardia) 07/22/2019   a.) brief 5 beat run noted on Zio   HLD (hyperlipidemia)    Hypertension    Kidney [place]    [place]  Osteoarthritis    Plantar fascial fibromatosis    Pulmonary embolus (HCC) 03/21/2020   a.) BILATERAL; required PA thrombolysis and mechanical thrombectomy on 03/22/2020; b.) IVC filter placed prior to knee surgery on 12/12/2020 and removed on 04/19/2021   PVC (premature ventricular contraction)    Rheumatoid arthritis (HCC)    Valgus deformity of great toes, bilateral    Vitamin D deficiency    Past Surgical History:  Procedure Laterality Date   ANTERIOR AND POSTERIOR REPAIR N/A 07/23/2021   Procedure: ANTERIOR (CYSTOCELE) AND POSTERIOR REPAIR (RECTOCELE);   Surgeon: Linzie Collin, MD;  Location: [facility] ORS;  Service: Gynecology;  Laterality: N/A;   CARDIAC CATHETERIZATION  2006   carpel tunnel Bilateral    CATARACT EXTRACTION, BILATERAL     CHOLECYSTECTOMY N/A 2006   COLONOSCOPY WITH PROPOFOL N/A 06/20/2022   Procedure: COLONOSCOPY WITH PROPOFOL;  Surgeon: Wyline Mood, MD;  Location: [facility] ENDOSCOPY;  Service: Gastroenterology;  Laterality: N/A;   IVC FILTER INSERTION N/A 12/11/2020   Procedure: IVC FILTER INSERTION;  Surgeon: Annice Needy, MD;  Location: [facility] INVASIVE CV LAB;  Service: Cardiovascular;  Laterality: N/A;   IVC FILTER REMOVAL N/A 04/19/2021   Procedure: IVC FILTER REMOVAL;  Surgeon: Annice Needy, MD;  Location: [facility] INVASIVE CV LAB;  Service: Cardiovascular;  Laterality: N/A;   JOINT REPLACEMENT Right 2021   KNEE   LEFT HEART CATH AND CORONARY ANGIOGRAPHY Left 06/14/2019   Procedure: LEFT HEART CATH AND CORONARY ANGIOGRAPHY;  Surgeon: Iran Ouch, MD;  Location: [facility] INVASIVE CV LAB;  Service: Cardiovascular;  Laterality: Left;   PUBOVAGINAL SLING N/A 07/23/2021   Procedure: Leonides Grills;  Surgeon: Linzie Collin, MD;  Location: [facility] ORS;  Service: Gynecology;  Laterality: N/A;   PULMONARY THROMBECTOMY Bilateral 03/22/2020   Procedure: PULMONARY THROMBECTOMY / THROMBOLYSIS;  Surgeon: Annice Needy, MD;  Location: [facility] INVASIVE CV LAB;  Service: Cardiovascular;  Laterality: Bilateral;   REPLACEMENT TOTAL KNEE Left 2022   VAGINAL HYSTERECTOMY N/A 07/23/2021   Procedure: HYSTERECTOMY VAGINAL;  Surgeon: Linzie Collin, MD;  Location: [facility] ORS;  Service: Gynecology;  Laterality: N/A;   Family History  Problem Relation Age of Onset   Asthma Mother    Asthma Father    Heart failure Father    Leukemia Sister    Breast cancer Neg Hx    Social History   Socioeconomic History   Marital status: Married    Spouse name: Jeno   Number of children: 1   Years of education: Not on file   Highest education level: Not  on file  Occupational History   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  Tobacco Use   Smoking status: Never   Smokeless tobacco: Never  Vaping Use   Vaping status: Never Used  Substance and Sexual Activity   Alcohol use: Not Currently    Comment: OCCAS   Drug use: Never   Sexual activity: Not Currently    Birth control/protection: Surgical    Comment: hysterectomy  Other Topics Concern   Not on file  Social History Narrative   Lives at home with spouse   Social Determinants of Health   Financial Resource Strain: Low Risk  (07/16/2023)   Overall Financial Resource Strain (CARDIA)    Difficulty of Paying Living Expenses: Not hard at all  Food Insecurity: No Food Insecurity (07/16/2023)   Hunger Vital Sign    Worried About Running Out of Food in the Last Year: Never true  Ran Out of Food in the Last Year: Never true  Transportation Needs: No Transportation Needs (07/16/2023)   PRAPARE - Administrator, Civil Service (Medical): No    Lack of Transportation (Non-Medical): No  Physical Activity: Inactive (07/16/2023)   Exercise Vital Sign    Days of Exercise per Week: 0 days    Minutes of Exercise per Session: 0 min  Stress: No Stress Concern Present (07/16/2023)   [employer] of Occupational Health - Occupational Stress Questionnaire    Feeling of Stress : Not at all  Social Connections: Moderately Integrated (07/16/2023)   Social Connection and Isolation Panel [NHANES]    Frequency of Communication with Friends and Family: More than three times a week    Frequency of Social Gatherings with Friends and Family: More than three times a week    Attends Religious Services: 1 to 4 times per year    Active Member of [employer] or Organizations: No    Attends Engineer, structural: Never    Marital Status: Married    Tobacco Counseling Counseling given: Not Answered   Clinical Intake:  Pre-visit preparation completed: Yes  Pain : No/denies  pain     BMI - recorded: 33.66 Nutritional Status: BMI > 30  Obese Nutritional Risks: None Diabetes: No  How often do you need to have someone help you when you read instructions, pamphlets, or other written materials from your doctor or pharmacy?: 1 - Never  Interpreter Needed?: No  Information entered by :: R.  LPN   Activities of Daily Living    07/16/2023    3:23 PM  In your present state of health, do you have any difficulty performing the following activities:  Hearing? 0  Vision? 0  Comment glasses  Difficulty concentrating or making decisions? 0  Walking or climbing stairs? 0  Dressing or bathing? 0  Doing errands, shopping? 0  Preparing Food and eating ? N  Using the Toilet? N  In the past six months, have you accidently leaked urine? N  Do you have problems with loss of bowel control? N  Managing your Medications? N  Managing your Finances? N  Housekeeping or managing your Housekeeping? N    Patient Care Team: Glori Luis, MD as PCP - General (Family Medicine) Iran Ouch, MD as PCP - Cardiology (Cardiology)  Indicate any recent Medical Services you may have received from other than [facility] providers in the past year (date may be approximate).     Assessment:   This is a routine wellness examination for [place].  Hearing/Vision screen Hearing Screening - Comments:: No issues Vision Screening - Comments:: glasses  Dietary issues and exercise activities discussed:     Goals Addressed             This Visit's Progress    Patient Stated       Wants to walk more       Depression Screen    07/16/2023    3:29 PM 03/19/2023    8:10 AM 12/16/2022    1:29 PM 09/11/2022   10:02 AM 07/09/2022    9:52 AM 06/11/2022    8:28 AM 01/09/2022    8:35 AM  PHQ 2/9 Scores  PHQ - 2 Score 0 2 0 0 0 0 0  PHQ- 9 Score 1 4         Fall Risk    07/16/2023    3:26 PM 03/19/2023    8:10 AM 12/16/2022  1:29 PM 09/11/2022   10:02 AM 07/09/2022     9:51 AM  Fall Risk   Falls in the past year? 0 0 0 0 0  Number falls in past yr: 0 0 0 0   Injury with Fall? 0 0 0 0   Risk for fall due to : No Fall Risks No Fall Risks No Fall Risks No Fall Risks   Follow up Falls prevention discussed;Falls evaluation completed Falls evaluation completed Falls evaluation completed Falls evaluation completed Falls evaluation completed    MEDICARE RISK AT HOME:  Medicare Risk at Home - 07/16/23 1526     Any stairs in or around the home? No    If so, are there any without handrails? No    Home free of loose throw rugs in walkways, pet beds, electrical cords, etc? Yes    Adequate lighting in your home to reduce risk of falls? Yes    Life alert? No    Use of a cane, walker or w/c? No    Grab bars in the bathroom? Yes    Shower chair or bench in shower? No    Elevated toilet seat or a handicapped toilet? Yes              Cognitive Function:        07/16/2023    3:34 PM 07/09/2022   10:08 AM 06/15/2021    2:32 PM 06/14/2020    1:52 PM  6CIT Screen  What Year? 0 points 0 points 0 points 0 points  What month? 0 points 0 points 0 points 0 points  What time? 0 points 0 points 0 points   Count back from 20 0 points 0 points 0 points   Months in reverse 0 points 0 points 0 points 0 points  Repeat phrase 2 points 0 points 0 points 0 points  Total Score 2 points 0 points 0 points     Immunizations Immunization History  Administered Date(s) Administered   Fluad Quad(high Dose 65+) 09/04/2020, 09/11/2022   PNEUMOCOCCAL CONJUGATE-20 08/08/2021    TDAP status: Due, Education has been provided regarding the importance of this vaccine. Advised may receive this vaccine at local pharmacy or Health Dept. Aware to provide a copy of the vaccination record if obtained from local pharmacy or Health Dept. Verbalized acceptance and understanding.  Flu Vaccine status: Up to date  Pneumococcal vaccine status: Up to date  Covid-19 vaccine status: Declined,  Education has been provided regarding the importance of this vaccine but patient still declined. Advised may receive this vaccine at local pharmacy or Health Dept.or vaccine clinic. Aware to provide a copy of the vaccination record if obtained from local pharmacy or Health Dept. Verbalized acceptance and understanding.  Qualifies for Shingles Vaccine? Yes   Zostavax completed No   Shingrix Completed?: No.    Education has been provided regarding the importance of this vaccine. Patient has been advised to call insurance company to determine out of pocket expense if they have not yet received this vaccine. Advised may also receive vaccine at local pharmacy or Health Dept. Verbalized acceptance and understanding.  Screening Tests Health Maintenance  Topic Date Due   DTaP/Tdap/Td (1 - Tdap) Never done   DEXA SCAN  Never done   Colonoscopy  06/21/2023   Medicare Annual Wellness (AWV)  07/10/2023   INFLUENZA VACCINE  07/03/2023   MAMMOGRAM  12/06/2024   Pneumonia Vaccine 56+ Years old  Completed   HPV VACCINES  Aged Out  COVID-19 Vaccine  Discontinued   Hepatitis C Screening  Discontinued   Zoster Vaccines- Shingrix  Discontinued    Health Maintenance  Health Maintenance Due  Topic Date Due   DTaP/Tdap/Td (1 - Tdap) Never done   DEXA SCAN  Never done   Colonoscopy  06/21/2023   Medicare Annual Wellness (AWV)  07/10/2023   INFLUENZA VACCINE  07/03/2023    Colorectal cancer screening: Type of screening: Colonoscopy. Completed 7/23. Repeat every 1 years Patient received a letter and will call GI to set an appointment up  Mammogram Status  Completed 1/24. Repeat in 6 months  Bone Density Status Never done. Patient stated that she had a Bone Scan. Patient will talk with her Rheumatologist about this  Lung Cancer Screening: (Low Dose CT Chest recommended if Age 88-80 years, 20 pack-year currently smoking OR have quit w/in 15years.) does not qualify.      Additional  Screening:  Hepatitis C Screening: does qualify; Completed Needs with next lab work  Vision Screening: Recommended annual ophthalmology exams for early detection of glaucoma and other disorders of the eye. Is the patient up to date with their annual eye exam?  Yes  Who is the provider or what is the name of the office in which the patient attends annual eye exams? [facility] If pt is not established with a provider, would they like to be referred to a provider to establish care? No .   Dental Screening: Recommended annual dental exams for proper oral hygiene   Community Resource Referral / Chronic Care Management: CRR required this visit?  No   CCM required this visit?  No     Plan:     I have personally reviewed and noted the following in the patient's chart:   Medical and social history Use of alcohol, tobacco or illicit drugs  Current medications and supplements including opioid prescriptions. Patient is not currently taking opioid prescriptions. Functional ability and status Nutritional status Physical activity Advanced directives List of other physicians Hospitalizations, surgeries, and ER visits in previous 12 months Vitals Screenings to include cognitive, depression, and falls Referrals and appointments  In addition, I have reviewed and discussed with patient certain preventive protocols, quality metrics, and best practice recommendations. A written personalized care plan for preventive services as well as general preventive health recommendations were provided to patient.     Sydell Axon, LPN   01/21/2541   After Visit Summary: (MyChart) Due to this being a telephonic visit, the after visit summary with patients personalized plan was offered to patient via MyChart   Nurse Notes: None

## 2023-07-16 NOTE — Patient Instructions (Signed)
[name] , Thank you for taking time to come for your Medicare Wellness Visit. I appreciate your ongoing commitment to your health goals. Please review the following plan we discussed and let me know if I can assist you in the future.   Referrals/Orders/Follow-Ups/Clinician Recommendations: None  This is a list of the screening recommended for you and due dates:  Health Maintenance  Topic Date Due   DTaP/Tdap/Td vaccine (1 - Tdap) Never done   DEXA scan (bone density measurement)  Never done   Colon Cancer Screening  06/21/2023   Flu Shot  07/03/2023   Medicare Annual Wellness Visit  07/15/2024   Mammogram  12/06/2024   Pneumonia Vaccine  Completed   HPV Vaccine  Aged Out   COVID-19 Vaccine  Discontinued   Hepatitis C Screening  Discontinued   Zoster (Shingles) Vaccine  Discontinued    Advanced directives: (Copy Requested) Please bring a copy of your health care power of attorney and living will to the office to be added to your chart at your convenience.  Next Medicare Annual Wellness Visit scheduled for next year: Yes 07/19/24 @ 11:00  Preventive Care 65 Years and Older, Female Preventive care refers to lifestyle choices and visits with your health care provider that can promote health and wellness. What does preventive care include? A yearly physical exam. This is also called an annual well check. Dental exams once or twice a year. Routine eye exams. Ask your health care provider how often you should have your eyes checked. Personal lifestyle choices, including: Daily care of your teeth and gums. Regular physical activity. Eating a healthy diet. Avoiding tobacco and drug use. Limiting alcohol use. Practicing safe sex. Taking low-dose aspirin every day. Taking vitamin and mineral supplements as recommended by your health care provider. What happens during an annual well check? The services and screenings done by your health care provider during your annual well check  will depend on your age, overall health, lifestyle risk factors, and family history of disease. Counseling  Your health care provider may ask you questions about your: Alcohol use. Tobacco use. Drug use. Emotional well-being. Home and relationship well-being. Sexual activity. Eating habits. History of falls. Memory and ability to understand (cognition). Work and work Astronomer. Reproductive health. Screening  You may have the following tests or measurements: Height, weight, and BMI. Blood pressure. Lipid and cholesterol levels. These may be checked every 5 years, or more frequently if you are over 63 years old. Skin check. Lung cancer screening. You may have this screening every year starting at age 86 if you have a 30-pack-year history of smoking and currently smoke or have quit within the past 15 years. Fecal occult blood test (FOBT) of the stool. You may have this test every year starting at age 58. Flexible sigmoidoscopy or colonoscopy. You may have a sigmoidoscopy every 5 years or a colonoscopy every 10 years starting at age 54. Hepatitis C blood test. Hepatitis B blood test. Sexually transmitted disease (STD) testing. Diabetes screening. This is done by checking your blood sugar (glucose) after you have not eaten for a while (fasting). You may have this done every 1-3 years. Bone density scan. This is done to screen for osteoporosis. You may have this done starting at age 72. Mammogram. This may be done every 1-2 years. Talk to your health care provider about how often you should have regular mammograms. Talk with your health care provider about your test results, treatment options, and if necessary, the need  for more tests. Vaccines  Your health care provider may recommend certain vaccines, such as: Influenza vaccine. This is recommended every year. Tetanus, diphtheria, and acellular pertussis (Tdap, Td) vaccine. You may need a Td booster every 10 years. Zoster vaccine. You  may need this after age 57. Pneumococcal 13-valent conjugate (PCV13) vaccine. One dose is recommended after age 76. Pneumococcal polysaccharide (PPSV23) vaccine. One dose is recommended after age 3. Talk to your health care provider about which screenings and vaccines you need and how often you need them. This information is not intended to replace advice given to you by your health care provider. Make sure you discuss any questions you have with your health care provider. Document Released: 12/15/2015 Document Revised: 08/07/2016 Document Reviewed: 09/19/2015 Elsevier Interactive Patient Education  2017 [employer].  Fall Prevention in the Home Falls can cause injuries. They can happen to people of all ages. There are many things you can do to make your home safe and to help prevent falls. What can I do on the outside of my home? Regularly fix the edges of walkways and driveways and fix any cracks. Remove anything that might make you trip as you walk through a door, such as a raised step or threshold. Trim any bushes or trees on the path to your home. Use bright outdoor lighting. Clear any walking paths of anything that might make someone trip, such as rocks or tools. Regularly check to see if handrails are loose or broken. Make sure that both sides of any steps have handrails. Any raised decks and porches should have guardrails on the edges. Have any leaves, snow, or ice cleared regularly. Use sand or salt on walking paths during winter. Clean up any spills in your garage right away. This includes oil or grease spills. What can I do in the bathroom? Use night lights. Install grab bars by the toilet and in the tub and shower. Do not use towel bars as grab bars. Use non-skid mats or decals in the tub or shower. If you need to sit down in the shower, use a plastic, non-slip stool. Keep the floor dry. Clean up any water that spills on the floor as soon as it happens. Remove soap buildup  in the tub or shower regularly. Attach bath mats securely with double-sided non-slip rug tape. Do not have throw rugs and other things on the floor that can make you trip. What can I do in the bedroom? Use night lights. Make sure that you have a light by your bed that is easy to reach. Do not use any sheets or blankets that are too big for your bed. They should not hang down onto the floor. Have a firm chair that has side arms. You can use this for support while you get dressed. Do not have throw rugs and other things on the floor that can make you trip. What can I do in the kitchen? Clean up any spills right away. Avoid walking on wet floors. Keep items that you use a lot in easy-to-reach places. If you need to reach something above you, use a strong step stool that has a grab bar. Keep electrical cords out of the way. Do not use floor polish or wax that makes floors slippery. If you must use wax, use non-skid floor wax. Do not have throw rugs and other things on the floor that can make you trip. What can I do with my stairs? Do not leave any items on the stairs.  Make sure that there are handrails on both sides of the stairs and use them. Fix handrails that are broken or loose. Make sure that handrails are as long as the stairways. Check any carpeting to make sure that it is firmly attached to the stairs. Fix any carpet that is loose or worn. Avoid having throw rugs at the top or bottom of the stairs. If you do have throw rugs, attach them to the floor with carpet tape. Make sure that you have a light switch at the top of the stairs and the bottom of the stairs. If you do not have them, ask someone to add them for you. What else can I do to help prevent falls? Wear shoes that: Do not have high heels. Have rubber bottoms. Are comfortable and fit you well. Are closed at the toe. Do not wear sandals. If you use a stepladder: Make sure that it is fully opened. Do not climb a closed  stepladder. Make sure that both sides of the stepladder are locked into place. Ask someone to hold it for you, if possible. Clearly mark and make sure that you can see: Any grab bars or handrails. First and last steps. Where the edge of each step is. Use tools that help you move around (mobility aids) if they are needed. These include: Canes. Walkers. Scooters. Crutches. Turn on the lights when you go into a dark area. Replace any light bulbs as soon as they burn out. Set up your furniture so you have a clear path. Avoid moving your furniture around. If any of your floors are uneven, fix them. If there are any pets around you, be aware of where they are. Review your medicines with your doctor. Some medicines can make you feel dizzy. This can increase your chance of falling. Ask your doctor what other things that you can do to help prevent falls. This information is not intended to replace advice given to you by your health care provider. Make sure you discuss any questions you have with your health care provider. Document Released: 09/14/2009 Document Revised: 04/25/2016 Document Reviewed: 12/23/2014 Elsevier Interactive Patient Education  2017 [employer].

## 2023-07-18 DIAGNOSIS — M19042 Primary osteoarthritis, left hand: Secondary | ICD-10-CM | POA: Diagnosis not present

## 2023-07-23 DIAGNOSIS — M9905 Segmental and somatic dysfunction of pelvic region: Secondary | ICD-10-CM | POA: Diagnosis not present

## 2023-07-23 DIAGNOSIS — M9903 Segmental and somatic dysfunction of lumbar region: Secondary | ICD-10-CM | POA: Diagnosis not present

## 2023-07-23 DIAGNOSIS — M5417 Radiculopathy, lumbosacral region: Secondary | ICD-10-CM | POA: Diagnosis not present

## 2023-07-23 DIAGNOSIS — M5416 Radiculopathy, lumbar region: Secondary | ICD-10-CM | POA: Diagnosis not present

## 2023-07-24 DIAGNOSIS — M0579 Rheumatoid arthritis with rheumatoid factor of multiple sites without organ or systems involvement: Secondary | ICD-10-CM | POA: Diagnosis not present

## 2023-07-24 DIAGNOSIS — M5416 Radiculopathy, lumbar region: Secondary | ICD-10-CM | POA: Diagnosis not present

## 2023-07-24 DIAGNOSIS — M79641 Pain in right hand: Secondary | ICD-10-CM | POA: Diagnosis not present

## 2023-07-24 DIAGNOSIS — Z79899 Other long term (current) drug therapy: Secondary | ICD-10-CM | POA: Diagnosis not present

## 2023-07-24 DIAGNOSIS — M79642 Pain in left hand: Secondary | ICD-10-CM | POA: Diagnosis not present

## 2023-07-25 ENCOUNTER — Encounter: Payer: Self-pay | Admitting: Dermatology

## 2023-08-01 ENCOUNTER — Other Ambulatory Visit: Payer: Self-pay

## 2023-08-01 ENCOUNTER — Telehealth: Payer: Self-pay

## 2023-08-01 ENCOUNTER — Ambulatory Visit
Admission: EM | Admit: 2023-08-01 | Discharge: 2023-08-01 | Disposition: A | Discharge status: Home / Self Care | Payer: Medicare Other | Attending: Emergency Medicine | Admitting: Emergency Medicine

## 2023-08-01 DIAGNOSIS — K068 Other specified disorders of gingiva and edentulous alveolar ridge: Secondary | ICD-10-CM

## 2023-08-01 LAB — POCT RAPID STREP A (OFFICE): Rapid Strep A Screen: NEGATIVE

## 2023-08-01 MED ORDER — AMOXICILLIN-POT CLAVULANATE 875-125 MG PO TABS: 1.0000 | ORAL_TABLET | Freq: Two times a day (BID) | ORAL | 0 refills | Status: DC

## 2023-08-01 MED ORDER — NYSTATIN 100000 UNIT/ML MT SUSP: 5.0000 mL | Freq: Four times a day (QID) | OROMUCOSAL | 0 refills | Status: DC | PRN

## 2023-08-01 NOTE — Telephone Encounter (Signed)
Patient just called back. She states she already went to a walk- in clinic. I scheduled her an appoinment in September.

## 2023-08-01 NOTE — ED Provider Notes (Signed)
Renaldo Fiddler    CSN: [id number] Arrival date & time: 08/01/23  1131      History   Chief Complaint Chief Complaint  Patient presents with   Sore Throat   Mouth Lesions   Nausea    HPI Cindy Stein is a 67 y.o. female.   Patient presents for evaluation of sores to the inside of the cheek and pain to the throat present for 3 days.  Symptoms started abruptly, has not occurred before.  Only able to tolerate soft foods such as pudding.  Denies changes in diet, medication.  Sees a dentist regularly and is not in any need of dental work, wears dentures to the top, fits properly.  Denies presence of fever.  Past Medical History:  Diagnosis Date   Anxiety    Atypical chest pain    DOE (dyspnea on exertion)    DVT of lower extremity (deep venous thrombosis) (HCC) 03/20/2020   a.) LEFT femoral, popliteal, posterior tibial, and paroneal veins; Tx'd with Rivaroxaban; b.) embolized on 03/21/2020 requiring PA thrombolysis and mechanical thrombectomy on 03/22/2020   Grade I diastolic dysfunction 03/21/2020   History of immunosuppression    long-term MTX for RA diagnosis   History of PSVT (paroxysmal supraventricular tachycardia) 07/22/2019   a.) brief 5 beat run noted on Zio   HLD (hyperlipidemia)    Hypertension    Kidney stone    [place]   Osteoarthritis    Plantar fascial fibromatosis    Pulmonary embolus (HCC) 03/21/2020   a.) BILATERAL; required PA thrombolysis and mechanical thrombectomy on 03/22/2020; b.) IVC filter placed prior to knee surgery on 12/12/2020 and removed on 04/19/2021   PVC (premature ventricular contraction)    Rheumatoid arthritis (HCC)    Valgus deformity of great toes, bilateral    Vitamin D deficiency     Patient Active Problem List   Diagnosis Date Noted   Neck pain on right side 03/19/2023   Aortic atherosclerosis (HCC) 03/19/2023   Right upper quadrant pain 06/11/2022   Morton's neuroma of left foot 05/28/2022   Hallux  valgus 04/30/2022   Achilles tendinitis 04/30/2022   Synovitis of ankle 04/30/2022   Tenosynovitis of left foot 04/30/2022   Pain in finger of left hand 03/30/2022   Metatarsalgia of both feet 03/12/2022   Rheumatoid arthritis (HCC) 01/09/2022   Breast cyst 01/09/2022   Anxiety and depression 08/08/2021   Chest pain 08/01/2021   Prediabetes 07/09/2021   Proteinuria 01/26/2021   History of cholecystectomy 10/24/2020   History of decompression of median nerve 10/24/2020   History of total knee arthroplasty 10/24/2020   Osteoarthritis of left knee 10/24/2020   Obesity 08/29/2020   Palpitations 06/27/2020   Pulmonary nodules 03/28/2020   History of pulmonary embolus (PE) 03/21/2020   History of DVT (deep vein thrombosis) 03/21/2020   Hypertension    PVC (premature ventricular contraction)     Past Surgical History:  Procedure Laterality Date   ANTERIOR AND POSTERIOR REPAIR N/A 07/23/2021   Procedure: ANTERIOR (CYSTOCELE) AND POSTERIOR REPAIR (RECTOCELE);  Surgeon: Linzie Collin, MD;  Location: [facility] ORS;  Service: Gynecology;  Laterality: N/A;   CARDIAC CATHETERIZATION  2006   carpel tunnel Bilateral    CATARACT EXTRACTION, BILATERAL     CHOLECYSTECTOMY N/A 2006   COLONOSCOPY WITH PROPOFOL N/A 06/20/2022   Procedure: COLONOSCOPY WITH PROPOFOL;  Surgeon: Wyline Mood, MD;  Location: [facility] ENDOSCOPY;  Service: Gastroenterology;  Laterality: N/A;   IVC FILTER INSERTION N/A 12/11/2020  Procedure: IVC FILTER INSERTION;  Surgeon: Annice Needy, MD;  Location: [facility] INVASIVE CV LAB;  Service: Cardiovascular;  Laterality: N/A;   IVC FILTER REMOVAL N/A 04/19/2021   Procedure: IVC FILTER REMOVAL;  Surgeon: Annice Needy, MD;  Location: [facility] INVASIVE CV LAB;  Service: Cardiovascular;  Laterality: N/A;   JOINT REPLACEMENT Right 2021   KNEE   LEFT HEART CATH AND CORONARY ANGIOGRAPHY Left 06/14/2019   Procedure: LEFT HEART CATH AND CORONARY ANGIOGRAPHY;  Surgeon: Iran Ouch, MD;   Location: [facility] INVASIVE CV LAB;  Service: Cardiovascular;  Laterality: Left;   PUBOVAGINAL SLING N/A 07/23/2021   Procedure: Leonides Grills;  Surgeon: Linzie Collin, MD;  Location: [facility] ORS;  Service: Gynecology;  Laterality: N/A;   PULMONARY THROMBECTOMY Bilateral 03/22/2020   Procedure: PULMONARY THROMBECTOMY / THROMBOLYSIS;  Surgeon: Annice Needy, MD;  Location: [facility] INVASIVE CV LAB;  Service: Cardiovascular;  Laterality: Bilateral;   REPLACEMENT TOTAL KNEE Left 2022   VAGINAL HYSTERECTOMY N/A 07/23/2021   Procedure: HYSTERECTOMY VAGINAL;  Surgeon: Linzie Collin, MD;  Location: [facility] ORS;  Service: Gynecology;  Laterality: N/A;    OB History     Gravida  1   Para  1   Term  1   Preterm      AB      Living  1      SAB      IAB      Ectopic      Multiple      Live Births  1            Home Medications    Prior to Admission medications   Medication Sig Start Date End Date Taking? Authorizing Provider  amoxicillin-clavulanate (AUGMENTIN) 875-125 MG tablet Take 1 tablet by mouth every 12 (twelve) hours. 08/01/23  Yes Valinda Hoar, NP  aspirin EC 81 MG tablet Take 81 mg by mouth in the morning. Swallow whole.   Yes [provider]  carvedilol (COREG) 6.25 MG tablet TAKE ONE TABLET BY MOUTH TWICE A DAY 04/30/23  Yes Glori Luis, MD  etanercept (ENBREL) 50 MG/ML injection Inject into the skin.   Yes [provider]  magic mouthwash (nystatin, lidocaine, diphenhydrAMINE, alum & mag hydroxide) suspension Swish and spit 5 mLs 4 (four) times daily as needed for mouth pain. 08/01/23  Yes Nikolos Billig R, NP  methotrexate (RHEUMATREX) 2.5 MG tablet Take 20 mg by mouth once a week. 09/10/22  Yes [provider]  naproxen sodium (ALEVE) 220 MG tablet Take 220 mg by mouth daily as needed (pain.).   Yes [provider]  olmesartan-hydrochlorothiazide (BENICAR HCT) 20-12.5 MG tablet TAKE ONE TABLET BY MOUTH ONCE A DAY  04/30/23  Yes Glori Luis, MD  ondansetron (ZOFRAN-ODT) 4 MG disintegrating tablet Take 1 tablet (4 mg total) by mouth every 8 (eight) hours as needed for nausea or vomiting. 07/23/22  Yes Glori Luis, MD  sertraline (ZOLOFT) 50 MG tablet TAKE ONE TABLET BY MOUTH DAILY 05/15/23  Yes Glori Luis, MD  pantoprazole (PROTONIX) 40 MG tablet Take 1 tablet (40 mg total) by mouth daily. 06/06/22 04/24/23  Menshew, Charlesetta Ivory, PA-C    Family History Family History  Problem Relation Age of Onset   Asthma Mother    Asthma Father    Heart failure Father    Leukemia Sister    Breast cancer Neg Hx     Social History Social History   Tobacco Use  Smoking status: Never   Smokeless tobacco: Never  Vaping Use   Vaping status: Never Used  Substance Use Topics   Alcohol use: Not Currently    Comment: OCCAS   Drug use: Never     Allergies   Acetaminophen and Tramadol   Review of Systems Review of Systems  HENT:  Positive for mouth sores and sore throat. Negative for congestion, dental problem, drooling, ear discharge, ear pain, facial swelling, hearing loss, nosebleeds, postnasal drip, rhinorrhea, sinus pressure, sinus pain, sneezing, tinnitus, trouble swallowing and voice change.      Physical Exam Triage Vital Signs ED Triage Vitals  Encounter Vitals Group     BP 08/01/23 1154 100/69     Systolic BP Percentile --      Diastolic BP Percentile --      Pulse Rate 08/01/23 1154 84     Resp 08/01/23 1154 20     Temp 08/01/23 1154 99.3 F (37.4 C)     Temp src --      SpO2 08/01/23 1154 100 %     Weight --      Height --      Head Circumference --      Peak Flow --      Pain Score 08/01/23 1149 9     Pain Loc --      Pain Education --      Exclude from Growth Chart --    No data found.  Updated Vital Signs BP 100/69   Pulse 84   Temp 99.3 F (37.4 C)   Resp 20   SpO2 100%   Visual Acuity Right Eye Distance:   Left Eye Distance:   Bilateral  Distance:    Right Eye Near:   Left Eye Near:    Bilateral Near:     Physical Exam Constitutional:      Appearance: Normal appearance.  HENT:     Mouth/Throat:     Comments: Erythema is present to the bilateral internal cheek, erythema present to the lower lip, no lesions noted, erythema and irritation noted to the papula of the tongue, no fungus noted, no abnormality noted to the teeth  , No abnormality noted to the pharynx, no exudate noted, pharynx without obstruction Neurological:     Mental Status: She is alert.      UC Treatments / Results  Labs (all labs ordered are listed, but only abnormal results are displayed) Labs Reviewed  POCT RAPID STREP A (OFFICE)    EKG   Radiology No results found.  Procedures Procedures (including critical care time)  Medications Ordered in UC Medications - No data to display  Initial Impression / Assessment and Plan / UC Course  I have reviewed the triage vital signs and the nursing notes.  Pertinent labs & imaging results that were available during my care of the patient were reviewed by me and considered in my medical decision making (see chart for details).  Pain in gums  Erythema and irritation is present on exam, rapid strep test negative, unknown etiology, discussed with patient and family, prophylactically providing antibiotic with use of Augmentin as well as Magic mouthwash for comfort, recommended additional supportive measures through over-the-counter Tylenol, salt water gargles throat lozenges salt fluids and warm liquids, advised follow-up after completion of medication with primary doctor for reevaluation, may follow-up with urgent care as needed Final Clinical Impressions(s) / UC Diagnoses   Final diagnoses:  Pain in gums     Discharge Instructions  On exam there is redness and irritation to the bilateral cheeks, there is no abnormalities to the throat and throat swab shows that there is no throat  infection currently, on exam there is no abnormality to the teeth that would indicate a dental cause for your symptoms  Begin Augmentin every morning and every evening for 7 days to provide bacterial coverage  Gargle and spit Magic mouthwash solution to provide temporary relief to your mouth discomfort  May take Tylenol as needed for pain every 6 hours  May attempt salt water gargles, throat lozenges, soft foods, warm liquids for additional comfort  Please schedule follow-up appointment in 1 to 2 weeks with your primary doctor for reevaluation   ED Prescriptions     Medication Sig Dispense Auth. Provider   amoxicillin-clavulanate (AUGMENTIN) 875-125 MG tablet Take 1 tablet by mouth every 12 (twelve) hours. 14 tablet Salli Quarry R, NP   magic mouthwash (nystatin, lidocaine, diphenhydrAMINE, alum & mag hydroxide) suspension Swish and spit 5 mLs 4 (four) times daily as needed for mouth pain. 180 mL Valinda Hoar, NP      PDMP not reviewed this encounter.   Valinda Hoar, NP 08/01/23 1259

## 2023-08-01 NOTE — Telephone Encounter (Signed)
If there are not any appointments available within our office or at one of our other offices then she could go to the [place] walk-in clinic for evaluation.  It appears that she needs to be evaluated today based on the reported symptoms.

## 2023-08-01 NOTE — Discharge Instructions (Signed)
On exam there is redness and irritation to the bilateral cheeks, there is no abnormalities to the throat and throat swab shows that there is no throat infection currently, on exam there is no abnormality to the teeth that would indicate a dental cause for your symptoms  Begin Augmentin every morning and every evening for 7 days to provide bacterial coverage  Gargle and spit Magic mouthwash solution to provide temporary relief to your mouth discomfort  May take Tylenol as needed for pain every 6 hours  May attempt salt water gargles, throat lozenges, soft foods, warm liquids for additional comfort  Please schedule follow-up appointment in 1 to 2 weeks with your primary doctor for reevaluation

## 2023-08-01 NOTE — Telephone Encounter (Signed)
Patient states she feels weak.  Patient states her mouth & tongue are red & looks like a burn, her throat is sore, no fever but hot and cold.  I transferred call to Access Nurse.

## 2023-08-01 NOTE — Telephone Encounter (Signed)
I will call the Patient and recommend [facility] walk in or the ED.

## 2023-08-01 NOTE — Telephone Encounter (Signed)
Patient called back to state Access Nurse told her that she needs to see someone.  I let her know that we do not have any appointments available and neither does Nature conservation officer at [facility].  Patient states she cannot drive to [place].  I asked patient if Access Nurse told her that she may need to go to an Urgent Care or ED.  Patient states she was told she needs to see her primary care provider.

## 2023-08-01 NOTE — Telephone Encounter (Signed)
Left message to call the office back.

## 2023-08-01 NOTE — ED Triage Notes (Signed)
Sore throat and sores in mouth since Tuesday. Pt denies fever

## 2023-08-02 ENCOUNTER — Emergency Department
Admission: EM | Admit: 2023-08-02 | Discharge: 2023-08-02 | Disposition: A | Discharge status: Home / Self Care | Payer: Medicare Other | Source: Home / Self Care | Attending: Emergency Medicine | Admitting: Emergency Medicine

## 2023-08-02 ENCOUNTER — Other Ambulatory Visit: Payer: Self-pay

## 2023-08-02 ENCOUNTER — Encounter: Payer: Self-pay | Admitting: Emergency Medicine

## 2023-08-02 DIAGNOSIS — I10 Essential (primary) hypertension: Secondary | ICD-10-CM | POA: Insufficient documentation

## 2023-08-02 DIAGNOSIS — K1370 Unspecified lesions of oral mucosa: Secondary | ICD-10-CM | POA: Insufficient documentation

## 2023-08-02 DIAGNOSIS — K120 Recurrent oral aphthae: Secondary | ICD-10-CM

## 2023-08-02 MED ORDER — DEXAMETHASONE 0.5 MG/5ML PO ELIX: 0.5000 mg | ORAL_SOLUTION | Freq: Every day | ORAL | 0 refills | Status: DC

## 2023-08-02 NOTE — ED Triage Notes (Signed)
Pt in via POV, complaints of sores to her mouth since Wednesday.  Multiple ulcers noted to mucous membranes of mouth.  Patient is unable to eat due to pain.  Ambulatory to triage; NAD noted at this time.

## 2023-08-02 NOTE — ED Provider Notes (Signed)
[facility] Provider Note    Event Date/Time   First MD Initiated Contact with Patient 08/02/23 1324     (approximate)   History   Mouth Lesions   HPI  Cindy Stein is a 67 y.o. female with PMH of HTN, anxiety, depression, rheumatoid arthritis who presents for evaluation of mouth lesions.  Patient developed her mouth sores on Wednesday.  She has been unable to eat or drink due to her pain.  She also reports waking up drenched in sweat.  She was seen in urgent care yesterday and tested negative for strep.      Physical Exam   Triage Vital Signs: ED Triage Vitals  Encounter Vitals Group     BP 08/02/23 1252 106/68     Systolic BP Percentile --      Diastolic BP Percentile --      Pulse Rate 08/02/23 1252 74     Resp 08/02/23 1252 16     Temp 08/02/23 1252 98.2 F (36.8 C)     Temp Source 08/02/23 1252 Oral     SpO2 08/02/23 1252 99 %     Weight 08/02/23 1253 194 lb (88 kg)     Height 08/02/23 1253 5\' 2"  (1.575 m)     Head Circumference --      Peak Flow --      Pain Score 08/02/23 1304 10     Pain Loc --      Pain Education --      Exclude from Growth Chart --     Most recent vital signs: Vitals:   08/02/23 1252  BP: 106/68  Pulse: 74  Resp: 16  Temp: 98.2 F (36.8 C)  SpO2: 99%    General: Awake, no distress.  CV:  Good peripheral perfusion. Resp:  Normal effort.  Abd:  No distention.  Other:  Patient has upper dentures that were removed for exam, multiple small ulcerations with a white center surrounded by a red halo on the inside of the middle lower lip, inside of left cheek, 1 on the soft palate on the left side.  All ulcers are very tender to palpation. no tonsillar enlargement or exudates.   ED Results / Procedures / Treatments   Labs (all labs ordered are listed, but only abnormal results are displayed) Labs Reviewed - No data to display   PROCEDURES:  Critical Care performed: No  Procedures   MEDICATIONS  ORDERED IN ED: Medications - No data to display   IMPRESSION / MDM / ASSESSMENT AND PLAN / ED COURSE  I reviewed the triage vital signs and the nursing notes.                             67 year old female presents for evaluation of multiple oral ulcers.  VSS in triage patient NAD on exam.  Differential diagnosis includes, but is not limited to, aphthous ulcer, herpes gingivostomatitis, herpangina, oral carcinoma.  Patient's presentation is most consistent with acute, uncomplicated illness.  I believe patient's mouth sores are aphthous ulcers.  I will treat her with a dexamethasone elixir.  Patient can continue using the Magic mouthwash provided to her by urgent care. I advised her to stop taking the antibiotics as this is not bacterial.  I advised her to follow-up with her primary care physician as needed.  She can also take Tylenol and ibuprofen for pain.  Patient was agreeable to plan, voiced understanding and  all questions were answered.  She was stable at discharge.    FINAL CLINICAL IMPRESSION(S) / ED DIAGNOSES   Final diagnoses:  Aphthous ulcer of mouth     Rx / DC Orders   ED Discharge Orders          Ordered    dexamethasone 0.5 MG/5ML elixir  Daily        08/02/23 1445             Note:  This document was prepared using Dragon voice recognition software and may include unintentional dictation errors.   Cameron Ali, PA-C 08/02/23 1448    Sharman Cheek, MD 08/02/23 1529

## 2023-08-02 NOTE — ED Notes (Signed)
Pt A&O x4, no obvious distress noted, respirations regular/unlabored. Pt verbalizes understanding of discharge instructions. Pt able to ambulate from ED independently.   

## 2023-08-02 NOTE — Discharge Instructions (Addendum)
You can continue to use the magic mouthwash as needed. Swish and spit 5 mLs of the dexamethasone twice a day. You can take tylenol and ibuprofen as needed for pain. Follow up with your primary care provider if you are still symptomatic by next Wednesday.

## 2023-08-07 DIAGNOSIS — M5416 Radiculopathy, lumbar region: Secondary | ICD-10-CM | POA: Diagnosis not present

## 2023-08-07 DIAGNOSIS — M5417 Radiculopathy, lumbosacral region: Secondary | ICD-10-CM | POA: Diagnosis not present

## 2023-08-07 DIAGNOSIS — M9903 Segmental and somatic dysfunction of lumbar region: Secondary | ICD-10-CM | POA: Diagnosis not present

## 2023-08-07 DIAGNOSIS — M9905 Segmental and somatic dysfunction of pelvic region: Secondary | ICD-10-CM | POA: Diagnosis not present

## 2023-08-08 ENCOUNTER — Ambulatory Visit (INDEPENDENT_AMBULATORY_CARE_PROVIDER_SITE_OTHER): Payer: Medicare Other | Admitting: Family Medicine

## 2023-08-08 ENCOUNTER — Encounter: Payer: Self-pay | Admitting: Family Medicine

## 2023-08-08 VITALS — BP 108/64 | HR 68 | Temp 98.0°F | Resp 16 | Ht 62.0 in | Wt 185.0 lb

## 2023-08-08 DIAGNOSIS — K120 Recurrent oral aphthae: Secondary | ICD-10-CM | POA: Diagnosis not present

## 2023-08-08 MED ORDER — SUCRALFATE 1 G PO TABS: 1.0000 g | ORAL_TABLET | Freq: Three times a day (TID) | ORAL | 1 refills | Status: DC

## 2023-08-08 NOTE — Progress Notes (Signed)
SUBJECTIVE:   Chief Complaint  Patient presents with   Mouth Lesions    X 2 weeks   HPI Presents to clinic for acute visit.  Patient was seen at UC 2 weeks ago and prescribed antibiotics and magic mouthwash for presentation of pain in throat and cheek pain that was ongoing for 3 days.  She reports medication did not help and went to [facility] ED for evaluation of mouth sores.  She was provided with Dexamethasone solution and was recommended to stop antibiotics.  Patient reports that this has slightly helped with the pain but she has almost run out and symptoms have not cleared.    Denies any fevers, difficulty swallowing, increased use of spicy or citric foods.  Endorses that she has been unable to eat due to pain of cheek.  She wear dentures but has been taking them out at home.  She has lost some weight since she has been on a mainly liquid diet secondary to mouth pain.  PERTINENT PMH / PSH: As above  OBJECTIVE:  BP 108/64   Pulse 68   Temp 98 F (36.7 C)   Resp 16   Ht 5\' 2"  (1.575 m)   Wt 185 lb (83.9 kg)   SpO2 98%   BMI 33.84 kg/m    Physical Exam HENT:     Mouth/Throat:     Mouth: Mucous membranes are moist. Oral lesions present. No angioedema.     Dentition: Has dentures. No dental abscesses.     Tongue: No lesions.     Pharynx: Oropharynx is clear. No pharyngeal swelling, oropharyngeal exudate or posterior oropharyngeal erythema.     Tonsils: No tonsillar exudate.     Comments: Upper denture removed.  Multiple small ulcerated areas on upper palate and left buccal erythema.  Lower gingiva with midline ulcer. All tender to palpation.       08/18/2023   11:42 AM 07/16/2023    3:29 PM 03/19/2023    8:10 AM 12/16/2022    1:29 PM 09/11/2022   10:02 AM  Depression screen PHQ 2/9  Decreased Interest 0 0 2 0 0  Down, Depressed, Hopeless 0 0 0 0 0  PHQ - 2 Score 0 0 2 0 0  Altered sleeping 1 0 0    Tired, decreased energy 1 1 2     Change in appetite 0 0 0    Feeling  bad or failure about yourself  0 0 0    Trouble concentrating 0 0 0    Moving slowly or fidgety/restless 0 0 0    Suicidal thoughts 0 0 0    PHQ-9 Score 2 1 4     Difficult doing work/chores Not difficult at all Not difficult at all Not difficult at all        08/18/2023   11:42 AM 03/19/2023    8:11 AM 12/16/2022    1:29 PM 09/25/2021   12:12 PM  GAD 7 : Generalized Anxiety Score  Nervous, Anxious, on Edge 1 0 0 0  Control/stop worrying 1 0 0 1  Worry too much - different things 1 0 0 1  Trouble relaxing 1 0 0 0  Restless 1 0 0 0  Easily annoyed or irritable 1 0 0 0  Afraid - awful might happen 1 2 0 0  Total GAD 7 Score 7 2 0 2  Anxiety Difficulty Not difficult at all Somewhat difficult Not difficult at all Not difficult at all    ASSESSMENT/PLAN:  Aphthous ulcer of mouth Assessment & Plan: No improvement with antibiotics  Some improvement with dexamethasone.  Continue as previously ordered Continue Magic Mouthwash as previously prescribed Start Carafate 4 times daily If no improvement in 1 week follow up with PCP    PDMP reviewed  Return if symptoms worsen or fail to improve, for PCP.  Dana Allan, MD

## 2023-08-08 NOTE — Patient Instructions (Signed)
It was a pleasure meeting you today. Thank you for allowing me to take part in your health care.  Our goals for today as we discussed include:  Start Carafate 1 tablet 3 times a day for 10 days Continue swish and swallow 4 times a day until completed  Remove dentures until healed Eat soft foors  If no improvement follow up with PCP  If you have any questions or concerns, please do not hesitate to call the office at [phone].  I look forward to our next visit and until then take care and stay safe.  Regards,   Dana Allan, MD   [facility]

## 2023-08-09 ENCOUNTER — Other Ambulatory Visit: Payer: Self-pay | Admitting: Family Medicine

## 2023-08-09 DIAGNOSIS — F32A Depression, unspecified: Secondary | ICD-10-CM

## 2023-08-11 ENCOUNTER — Other Ambulatory Visit: Payer: Self-pay | Admitting: Family Medicine

## 2023-08-11 ENCOUNTER — Telehealth: Payer: Self-pay

## 2023-08-11 DIAGNOSIS — F419 Anxiety disorder, unspecified: Secondary | ICD-10-CM

## 2023-08-11 NOTE — Telephone Encounter (Signed)
Patient has been prescribed Augmentin, Magic Mouthwash and Dexamethasone Elixir from [facility] Hospital.

## 2023-08-11 NOTE — Telephone Encounter (Signed)
Patient's daughter states patient developed severe canker sores about 2 weeks ago. She has been to the hospital and urgent care with no improvement from medications given.  They are inquiring if this could be a side effect from filler done 07/15/23 ([place]).

## 2023-08-12 ENCOUNTER — Observation Stay
Admission: EM | Admit: 2023-08-12 | Discharge: 2023-08-13 | Disposition: A | Discharge status: Home / Self Care | Payer: Medicare Other | Attending: Obstetrics and Gynecology | Admitting: Obstetrics and Gynecology

## 2023-08-12 ENCOUNTER — Other Ambulatory Visit: Payer: Self-pay

## 2023-08-12 ENCOUNTER — Telehealth: Payer: Self-pay | Admitting: Cardiovascular Disease

## 2023-08-12 ENCOUNTER — Emergency Department: Payer: Medicare Other

## 2023-08-12 DIAGNOSIS — Z79899 Other long term (current) drug therapy: Secondary | ICD-10-CM | POA: Insufficient documentation

## 2023-08-12 DIAGNOSIS — I959 Hypotension, unspecified: Secondary | ICD-10-CM

## 2023-08-12 DIAGNOSIS — M069 Rheumatoid arthritis, unspecified: Secondary | ICD-10-CM | POA: Diagnosis not present

## 2023-08-12 DIAGNOSIS — R0602 Shortness of breath: Secondary | ICD-10-CM | POA: Diagnosis not present

## 2023-08-12 DIAGNOSIS — Z86718 Personal history of other venous thrombosis and embolism: Secondary | ICD-10-CM | POA: Diagnosis not present

## 2023-08-12 DIAGNOSIS — I2510 Atherosclerotic heart disease of native coronary artery without angina pectoris: Secondary | ICD-10-CM | POA: Diagnosis not present

## 2023-08-12 DIAGNOSIS — N179 Acute kidney failure, unspecified: Secondary | ICD-10-CM

## 2023-08-12 DIAGNOSIS — Z96651 Presence of right artificial knee joint: Secondary | ICD-10-CM | POA: Diagnosis not present

## 2023-08-12 DIAGNOSIS — I2693 Single subsegmental pulmonary embolism without acute cor pulmonale: Secondary | ICD-10-CM | POA: Diagnosis not present

## 2023-08-12 DIAGNOSIS — I2699 Other pulmonary embolism without acute cor pulmonale: Secondary | ICD-10-CM | POA: Diagnosis present

## 2023-08-12 DIAGNOSIS — K449 Diaphragmatic hernia without obstruction or gangrene: Secondary | ICD-10-CM | POA: Diagnosis not present

## 2023-08-12 DIAGNOSIS — I10 Essential (primary) hypertension: Secondary | ICD-10-CM | POA: Diagnosis not present

## 2023-08-12 DIAGNOSIS — Z7982 Long term (current) use of aspirin: Secondary | ICD-10-CM | POA: Diagnosis not present

## 2023-08-12 LAB — URINALYSIS, ROUTINE W REFLEX MICROSCOPIC
Bilirubin Urine: NEGATIVE
Glucose, UA: NEGATIVE mg/dL
Hgb urine dipstick: NEGATIVE
Ketones, ur: 5 mg/dL — AB
Leukocytes,Ua: NEGATIVE
Nitrite: NEGATIVE
Protein, ur: NEGATIVE mg/dL
Specific Gravity, Urine: 1.033 — ABNORMAL HIGH (ref 1.005–1.030)
pH: 5 (ref 5.0–8.0)

## 2023-08-12 LAB — CBC
HCT: 39.2 % (ref 36.0–46.0)
Hemoglobin: 13.6 g/dL (ref 12.0–15.0)
MCH: 30.6 pg (ref 26.0–34.0)
MCHC: 34.7 g/dL (ref 30.0–36.0)
MCV: 88.3 fL (ref 80.0–100.0)
Platelets: 336 10*3/uL (ref 150–400)
RBC: 4.44 MIL/uL (ref 3.87–5.11)
RDW: 13.3 % (ref 11.5–15.5)
WBC: 4.9 10*3/uL (ref 4.0–10.5)
nRBC: 0 % (ref 0.0–0.2)

## 2023-08-12 LAB — BASIC METABOLIC PANEL WITH GFR
Anion gap: 9 (ref 5–15)
BUN: 23 mg/dL (ref 8–23)
CO2: 22 mmol/L (ref 22–32)
Calcium: 9.1 mg/dL (ref 8.9–10.3)
Chloride: 104 mmol/L (ref 98–111)
Creatinine, Ser: 1.19 mg/dL — ABNORMAL HIGH (ref 0.44–1.00)
GFR, Estimated: 49 mL/min — ABNORMAL LOW
Glucose, Bld: 135 mg/dL — ABNORMAL HIGH (ref 70–99)
Potassium: 3.2 mmol/L — ABNORMAL LOW (ref 3.5–5.1)
Sodium: 135 mmol/L (ref 135–145)

## 2023-08-12 LAB — TROPONIN I (HIGH SENSITIVITY): Troponin I (High Sensitivity): 5 ng/L

## 2023-08-12 LAB — HEPARIN LEVEL (UNFRACTIONATED): Heparin Unfractionated: <0.1 [IU]/mL — ABNORMAL LOW (ref 0.30–0.70)

## 2023-08-12 MED ORDER — LACTATED RINGERS IV SOLN: INTRAVENOUS | Status: AC

## 2023-08-12 MED ORDER — SODIUM CHLORIDE 0.9% FLUSH
3.0000 mL | Freq: Two times a day (BID) | INTRAVENOUS | Status: DC
  Administered 2023-08-12 – 2023-08-13 (×2): 3 mL via INTRAVENOUS

## 2023-08-12 MED ORDER — POTASSIUM CHLORIDE CRYS ER 20 MEQ PO TBCR
40.0000 meq | EXTENDED_RELEASE_TABLET | Freq: Once | ORAL | Status: AC
  Administered 2023-08-12: 40 meq via ORAL
  Filled 2023-08-12: qty 2

## 2023-08-12 MED ORDER — HEPARIN (PORCINE) 25000 UT/250ML-% IV SOLN
1100.0000 [IU]/h | INTRAVENOUS | Status: DC
  Administered 2023-08-12: 1100 [IU]/h via INTRAVENOUS
  Filled 2023-08-12: qty 250

## 2023-08-12 MED ORDER — ACETAMINOPHEN 325 MG PO TABS: 650.0000 mg | ORAL_TABLET | Freq: Four times a day (QID) | ORAL | Status: DC | PRN

## 2023-08-12 MED ORDER — DEXAMETHASONE 0.5 MG/5ML PO SOLN
0.5000 mg | Freq: Two times a day (BID) | ORAL | Status: DC
  Administered 2023-08-13: 0.5 mg via ORAL
  Filled 2023-08-12 (×2): qty 5

## 2023-08-12 MED ORDER — METHOCARBAMOL 500 MG PO TABS: 500.0000 mg | ORAL_TABLET | Freq: Three times a day (TID) | ORAL | Status: DC | PRN

## 2023-08-12 MED ORDER — POLYETHYLENE GLYCOL 3350 17 G PO PACK: 17.0000 g | PACK | Freq: Every day | ORAL | Status: DC | PRN

## 2023-08-12 MED ORDER — PANTOPRAZOLE SODIUM 40 MG PO TBEC
40.0000 mg | DELAYED_RELEASE_TABLET | Freq: Every day | ORAL | Status: DC
  Administered 2023-08-13: 40 mg via ORAL
  Filled 2023-08-12: qty 1

## 2023-08-12 MED ORDER — ASPIRIN 81 MG PO TBEC
81.0000 mg | DELAYED_RELEASE_TABLET | Freq: Every morning | ORAL | Status: DC
  Administered 2023-08-13: 81 mg via ORAL
  Filled 2023-08-12: qty 1

## 2023-08-12 MED ORDER — IOHEXOL 350 MG/ML SOLN
75.0000 mL | Freq: Once | INTRAVENOUS | Status: AC | PRN
  Administered 2023-08-12: 75 mL via INTRAVENOUS

## 2023-08-12 MED ORDER — ACETAMINOPHEN 650 MG RE SUPP: 650.0000 mg | Freq: Four times a day (QID) | RECTAL | Status: DC | PRN

## 2023-08-12 MED ORDER — HEPARIN BOLUS VIA INFUSION
4100.0000 [IU] | Freq: Once | INTRAVENOUS | Status: AC
  Administered 2023-08-12: 4100 [IU] via INTRAVENOUS
  Filled 2023-08-12: qty 4100

## 2023-08-12 MED ORDER — SUCRALFATE 1 G PO TABS
1.0000 g | ORAL_TABLET | Freq: Three times a day (TID) | ORAL | Status: DC
  Administered 2023-08-12 – 2023-08-13 (×3): 1 g via ORAL
  Filled 2023-08-12 (×3): qty 1

## 2023-08-12 MED ORDER — ONDANSETRON HCL 4 MG/2ML IJ SOLN: 4.0000 mg | Freq: Four times a day (QID) | INTRAMUSCULAR | Status: DC | PRN

## 2023-08-12 MED ORDER — ONDANSETRON HCL 4 MG PO TABS: 4.0000 mg | ORAL_TABLET | Freq: Four times a day (QID) | ORAL | Status: DC | PRN

## 2023-08-12 MED ORDER — LACTATED RINGERS IV BOLUS
1000.0000 mL | Freq: Once | INTRAVENOUS | Status: AC
  Administered 2023-08-12: 1000 mL via INTRAVENOUS

## 2023-08-12 MED ORDER — SERTRALINE HCL 50 MG PO TABS
50.0000 mg | ORAL_TABLET | Freq: Every day | ORAL | Status: DC
  Administered 2023-08-13: 50 mg via ORAL
  Filled 2023-08-12: qty 1

## 2023-08-12 NOTE — Telephone Encounter (Signed)
Pt c/o BP issue: STAT if pt c/o blurred vision, one-sided weakness or slurred speech  1. What are your last 5 BP readings?  74/54  2. Are you having any other symptoms (ex. Dizziness, headache, blurred vision, passed out)?  SOB, dizziness, blurred vision  3. What is your BP issue?   BP is low and patient has blurred vision, dizziness and SOB. Patient's daughter states they are on their way to the ED.

## 2023-08-12 NOTE — Assessment & Plan Note (Signed)
Last blood pressure reading normal continue to monitor

## 2023-08-12 NOTE — Telephone Encounter (Signed)
Returned the call back to the daughter, per the dpr. The daughter  stated the patient is being seen. Nothing further needed at this time.

## 2023-08-12 NOTE — Assessment & Plan Note (Signed)
On admission, patient was noted to have an elevated creatinine at 1.19 with GFR 49.  Urinalysis with ketonuria and increased Pacific gravity suggesting dehydration.  - IV fluids as ordered - Renal ultrasound - Avoid nephrotoxic agents - Repeat BMP in the a.m.

## 2023-08-12 NOTE — Telephone Encounter (Signed)
Call transferred directly to this RN from the Call Center.  I spoke with the patient's daughter who advised the patient has been having episodes of sweating and dizziness for the last several days.  They did check the patient's BP today and she was 74/54.   Per Nettie Elm, the patient is profusely sweating and they have just arrived in the ER for further evaluation.  The patient has had no recent medication changes.  She has been dealing with a canker sore in her mouth and has not been eating as much the last couple of weeks.   She currently has coreg 6.25 mg BID & Benicar-HCT 20/12.5 mg every day on her medication list.  The patient did take her medications this morning.   I have advised Nettie Elm that the patient may be dehydrated, but they should let the ER staff evaluate her to determine if there are any other underlying causes for her low BP.  Nettie Elm voices understanding and is agreeable.

## 2023-08-12 NOTE — Telephone Encounter (Signed)
Patient's daughter is calling requesting to speak with RN Herbert Seta in regards to the patient's BP. Patient's daughter stated they are still waiting for her to be seen at the ER and would like to know if it would be okay if she left and scheduled an appointment instead. Please advise.

## 2023-08-12 NOTE — Telephone Encounter (Signed)
Daughter advised of all information and scheduled. aw

## 2023-08-12 NOTE — ED Provider Notes (Signed)
[facility] Provider Note    Event Date/Time   First MD Initiated Contact with Patient 08/12/23 1538     (approximate)   History   Hypotension   HPI Cindy Stein is a 67 y.o. female HTN, anxiety and depression, prior PE no longer on blood thinners presenting today for hypotension.  Patient states over the past 3 days she is having intermittent lightheadedness, shortness of breath, and hypotension.  She noted her blood pressures being low today with systolics in the 90s.  At this time she is currently asymptomatic.  Patient recently has been dealing with canker sores in her mouth and does note that she has probably had decreased food and drink intake over the past 3 weeks.  She separately notes some mild chest tightness.  Otherwise denies fever, chills, cough, abdominal pain, nausea, vomiting, leg pain, leg swelling.  Patient has prior history of pulmonary embolism 3 years ago following a knee surgery and was previously on Xarelto but has been off of that for at least a year.     Physical Exam   Triage Vital Signs: ED Triage Vitals  Encounter Vitals Group     BP 08/12/23 1237 (!) 100/55     Systolic BP Percentile --      Diastolic BP Percentile --      Pulse Rate 08/12/23 1237 70     Resp 08/12/23 1237 17     Temp 08/12/23 1237 97.7 F (36.5 C)     Temp Source 08/12/23 1237 Oral     SpO2 08/12/23 1237 96 %     Weight 08/12/23 1238 182 lb 15.7 oz (83 kg)     Height 08/12/23 1238 5\' 2"  (1.575 m)     Head Circumference --      Peak Flow --      Pain Score 08/12/23 1238 0     Pain Loc --      Pain Education --      Exclude from Growth Chart --     Most recent vital signs: Vitals:   08/12/23 1237 08/12/23 1541  BP: (!) 100/55 (!) 121/101  Pulse: 70 (!) 57  Resp: 17 16  Temp: 97.7 F (36.5 C) (!) 97.4 F (36.3 C)  SpO2: 96% 100%   Physical Exam: I have reviewed the vital signs and nursing notes. General: Awake, alert, no acute distress.   Nontoxic appearing. Head:  Atraumatic, normocephalic.   ENT:  EOM intact, PERRL. Oral mucosa is pink and moist with no lesions. Neck: Neck is supple with full range of motion, No meningeal signs. Cardiovascular:  RRR, No murmurs. Peripheral pulses palpable and equal bilaterally. Respiratory:  Symmetrical chest wall expansion.  No rhonchi, rales, or wheezes.  Good air movement throughout.  No use of accessory muscles.   Musculoskeletal:  No cyanosis or edema. Moving extremities with full ROM Abdomen:  Soft, nontender, nondistended. Neuro:  GCS 15, moving all four extremities, interacting appropriately. Speech clear. Psych:  Calm, appropriate.   Skin:  Warm, dry, no rash.    ED Results / Procedures / Treatments   Labs (all labs ordered are listed, but only abnormal results are displayed) Labs Reviewed  BASIC METABOLIC PANEL - Abnormal; Notable for the following components:      Result Value   Potassium 3.2 (*)    Glucose, Bld 135 (*)    Creatinine, Ser 1.19 (*)    GFR, Estimated 49 (*)    All other components within normal limits  URINALYSIS, ROUTINE W REFLEX MICROSCOPIC - Abnormal; Notable for the following components:   Color, Urine YELLOW (*)    APPearance HAZY (*)    Specific Gravity, Urine 1.033 (*)    Ketones, ur 5 (*)    All other components within normal limits  CBC  HEPARIN LEVEL (UNFRACTIONATED)  CBC  CBG MONITORING, ED  TROPONIN I (HIGH SENSITIVITY)     EKG My EKG interpretation: Rate of 76, normal sinus rhythm, normal axis, normal intervals.  No acute ST elevations or depressions   RADIOLOGY CTA chest and belly interpreted by myself and radiologist with evidence of right lower lobe pulmonary embolism.  Some concern for possible right heart strain   PROCEDURES:  Critical Care performed: Yes, see critical care procedure note(s)  .Critical Care  Performed by: Janith Lima, MD Authorized by: Janith Lima, MD   Critical care provider statement:     Critical care time (minutes):  35   Critical care was necessary to treat or prevent imminent or life-threatening deterioration of the following conditions:  Circulatory failure (PE)   Critical care was time spent personally by me on the following activities:  Development of treatment plan with patient or surrogate, discussions with consultants, evaluation of patient's response to treatment, examination of patient, ordering and review of laboratory studies, ordering and review of radiographic studies, ordering and performing treatments and interventions, pulse oximetry, re-evaluation of patient's condition and review of old charts   Care discussed with: admitting provider      MEDICATIONS ORDERED IN ED: Medications  heparin ADULT infusion 100 units/mL (25000 units/24mL) (1,100 Units/hr Intravenous New Bag/Given 08/12/23 1843)  lactated ringers bolus 1,000 mL (0 mLs Intravenous Stopped 08/12/23 1836)  iohexol (OMNIPAQUE) 350 MG/ML injection 75 mL (75 mLs Intravenous Contrast Given 08/12/23 1628)  heparin bolus via infusion 4,100 Units (4,100 Units Intravenous Bolus from Bag 08/12/23 1844)     IMPRESSION / MDM / ASSESSMENT AND PLAN / ED COURSE  I reviewed the triage vital signs and the nursing notes.                              Differential diagnosis includes, but is not limited to, PE, pneumonia, dehydration, ACS.  Patient's presentation is most consistent with acute presentation with potential threat to life or bodily function.  Patient is a 67 year old female presenting today for hypotension with shortness of breath over the past several days.  Prior history of PE and not currently on any blood thinners.  Blood pressure improved here compared to her outside labs.  BMP does represent new AKI which may be evident of recent decreased p.o. intake.  Given 1 L LR.  CTA chest performed to evaluate for PE does show evidence of PE in the right lower lobe.  Unsure if there is right heart strain after  speaking with radiologist.  Discussed case with vascular who recommends no embolectomy at this time.  Patient started on heparin drip and admitted to hospitalist for further care.  The patient is on the cardiac monitor to evaluate for evidence of arrhythmia and/or significant heart rate changes. Clinical Course as of 08/12/23 1852  Tue Aug 12, 2023  1556 BP(!): 121/101 [DW]  1556 Basic metabolic panel(!) New AKI and mild hypokalemia from prior baseline.  Patient noting decreased oral intake given recent canker sores.  Will replete with 1 L LR. [DW]  1647 Troponin I (High Sensitivity): 5 No concern for ACS [  DW]  1703 Urinalysis, Routine w reflex microscopic -Urine, Clean Catch(!) No UTI but spec gravity is high likely indicating some dehydration [DW]  1740 CT Angio Chest PE W and/or Wo Contrast Radiology - PE in right lower lobe. Embolus looks to peripheral to cause right heart strain. Unlikely that this is cause of that. Would recommend speaking with vascular [DW]  1816 Vascular - no embolectomy needed at this time.  Does want her admitted and started on heparin for continued monitoring to make sure nothing worsens given some hypotension earlier. [DW]    Clinical Course User Index [DW] Janith Lima, MD     FINAL CLINICAL IMPRESSION(S) / ED DIAGNOSES   Final diagnoses:  Single subsegmental pulmonary embolism without acute cor pulmonale (HCC)  Hypotension, unspecified hypotension type  AKI (acute kidney injury) (HCC)     Rx / DC Orders   ED Discharge Orders     None        Note:  This document was prepared using Dragon voice recognition software and may include unintentional dictation errors.   Janith Lima, MD 08/12/23 [phone]

## 2023-08-12 NOTE — Consult Note (Signed)
PHARMACY CONSULT NOTE - ELECTROLYTES  Pharmacy Consult for Electrolyte Monitoring and Replacement   Recent Labs: Height: 5\' 2"  (157.5 cm) Weight: 83 kg (182 lb 15.7 oz) IBW/kg (Calculated) : 50.1 Estimated Creatinine Clearance: 44.6 mL/min (A) (by C-G formula based on SCr of 1.19 mg/dL (H)). Potassium (mmol/L)  Date Value  08/12/2023 3.2 (L)   Magnesium (mg/dL)  Date Value  86/57/8469 2.1   Calcium (mg/dL)  Date Value  62/95/2841 9.1   Albumin (g/dL)  Date Value  32/44/0102 4.0   Sodium (mmol/L)  Date Value  08/12/2023 135  06/18/2022 141   Corrected Ca: N/A mg/dL  Assessment  Cindy Stein is a 67 y.o. female presenting with hypotension, DOE. CTA showed PE. PMH significant for DVT/PE s/p thrombolysis, mechanical thrombectomy and Xarelto (no longer on [facility]), rheumatoid arthritis on Enbrel, hypertension, hyperlipidemia, prediabetes, aortic atherosclerosis . Pharmacy has been consulted to monitor and replace electrolytes.  Diet: Yes - regular MIVF: n/a Pertinent medications:  PTA:  olmesartan-hydrochlorothiazide (BENICAR HCT) 20-12.5 MG tablet - last dose 9/10 AM   Goal of Therapy: Electrolytes WNL  Plan:  PO KCL 40 mEq x 1  Check BMP, Mg, Phos with AM labs  Thank you for allowing pharmacy to be a part of this patient's care.  Sharen Hones, PharmD, BCPS Clinical Pharmacist   08/12/2023 8:27 PM

## 2023-08-12 NOTE — ED Triage Notes (Signed)
Pt sts for the last couple of days she sts that her BP has been low. Pt sts that she will be walking and than start sweating and than feel like she is going to pass out.

## 2023-08-12 NOTE — H&P (Signed)
History and Physical    Patient: Cindy Stein UJW:[id number] DOB: 1956-09-04 DOA: 08/12/2023 DOS: the patient was seen and examined on 08/12/2023 PCP: Glori Luis, MD  Patient coming from: Home  Chief Complaint:  Chief Complaint  Patient presents with   Hypotension   HPI: Cindy Stein is a 67 y.o. female with medical history significant of previous DVT/PE s/p thrombolysis, mechanical thrombectomy and Xarelto (no longer on [facility]), rheumatoid arthritis on Enbrel, hypertension, hyperlipidemia, prediabetes, aortic atherosclerosis, who presents to the ED due to low blood pressure.  Cindy Stein states that for the last 2-3 weeks, she has been experiencing dyspnea on exertion, with diaphoresis and fatigue when ambulating as well. She denies any lower extremity swelling. She denies any recent illness, chest pain or palpitations. Today, she was outside walking with her daughter she had sudden onset vision loss and dizziness. She went inside and her daughter took her BP and it was very low with systolics in the 60-70s. Due to this, she came to the ED.   ED course: On arrival to the ED, patient was initially hypotensive at 100/55 with improvement to 121/101.  She was saturating at 96% on room air.  Heart rate ranging between 57 and 70.  Initial workup notable for normal CBC, BMP with potassium 3.2, glucose 135, creatinine 1.19 and GFR 49.  Troponin negative at 5. Urinalysis with ketonuria and elevated gravity. CTA was obtained with evidence of PE in the lower branch of the right pulmonary artery with increased RV/LV ratio, however given peripheral nature of embolus, radiology commented that it would be unusual to result in right heart strain. Vascular surgery was consulted and recommended for heparin infusion and observation. [facility] contacted for admission.   Review of Systems: As mentioned in the history of present illness. All other systems reviewed and are negative.  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/2020   a.) LEFT femoral, popliteal, posterior tibial, and paroneal veins; Tx'd with Rivaroxaban; b.) embolized on 03/21/2020 requiring PA thrombolysis and mechanical thrombectomy on 03/22/2020   Grade I diastolic dysfunction 03/21/2020   History of immunosuppression    long-term MTX for RA diagnosis   History of PSVT (paroxysmal supraventricular tachycardia) 07/22/2019   a.) brief 5 beat run noted on Zio   HLD (hyperlipidemia)    Hypertension    Kidney [place]    [place]   Osteoarthritis    Plantar fascial fibromatosis    Pulmonary embolus (HCC) 03/21/2020   a.) BILATERAL; required PA thrombolysis and mechanical thrombectomy on 03/22/2020; b.) IVC filter placed prior to knee surgery on 12/12/2020 and removed on 04/19/2021   PVC (premature ventricular contraction)    Rheumatoid arthritis (HCC)    Valgus deformity of great toes, bilateral    Vitamin D deficiency    Past Surgical History:  Procedure Laterality Date   ANTERIOR AND POSTERIOR REPAIR N/A 07/23/2021   Procedure: ANTERIOR (CYSTOCELE) AND POSTERIOR REPAIR (RECTOCELE);  Surgeon: Linzie Collin, MD;  Location: [facility] ORS;  Service: Gynecology;  Laterality: N/A;   CARDIAC CATHETERIZATION  2006   carpel tunnel Bilateral    CATARACT EXTRACTION, BILATERAL     CHOLECYSTECTOMY N/A 2006   COLONOSCOPY WITH PROPOFOL N/A 06/20/2022   Procedure: COLONOSCOPY WITH PROPOFOL;  Surgeon: Wyline Mood, MD;  Location: [facility] ENDOSCOPY;  Service: Gastroenterology;  Laterality: N/A;  IVC FILTER INSERTION N/A 12/11/2020   Procedure: IVC FILTER INSERTION;  Surgeon: Annice Needy, MD;  Location: [facility] INVASIVE CV LAB;  Service: Cardiovascular;  Laterality: N/A;   IVC FILTER REMOVAL N/A 04/19/2021   Procedure: IVC FILTER REMOVAL;  Surgeon: Annice Needy, MD;  Location: [facility] INVASIVE CV LAB;  Service:  Cardiovascular;  Laterality: N/A;   JOINT REPLACEMENT Right 2021   KNEE   LEFT HEART CATH AND CORONARY ANGIOGRAPHY Left 06/14/2019   Procedure: LEFT HEART CATH AND CORONARY ANGIOGRAPHY;  Surgeon: Iran Ouch, MD;  Location: [facility] INVASIVE CV LAB;  Service: Cardiovascular;  Laterality: Left;   PUBOVAGINAL SLING N/A 07/23/2021   Procedure: Leonides Grills;  Surgeon: Linzie Collin, MD;  Location: [facility] ORS;  Service: Gynecology;  Laterality: N/A;   PULMONARY THROMBECTOMY Bilateral 03/22/2020   Procedure: PULMONARY THROMBECTOMY / THROMBOLYSIS;  Surgeon: Annice Needy, MD;  Location: [facility] INVASIVE CV LAB;  Service: Cardiovascular;  Laterality: Bilateral;   REPLACEMENT TOTAL KNEE Left 2022   VAGINAL HYSTERECTOMY N/A 07/23/2021   Procedure: HYSTERECTOMY VAGINAL;  Surgeon: Linzie Collin, MD;  Location: [facility] ORS;  Service: Gynecology;  Laterality: N/A;   Social History:  reports that she has never smoked. She has never used smokeless tobacco. She reports that she does not currently use alcohol. She reports that she does not use drugs.  Allergies  Allergen Reactions   Tramadol Other (See Comments)    Sedation     Family History  Problem Relation Age of Onset   Asthma Mother    Asthma Father    Heart failure Father    Leukemia Sister    Breast cancer Neg Hx     Prior to Admission medications   Medication Sig Start Date End Date Taking? Authorizing Provider  amoxicillin-clavulanate (AUGMENTIN) 875-125 MG tablet Take 1 tablet by mouth every 12 (twelve) hours. Patient not taking: Reported on 08/08/2023 08/01/23   Valinda Hoar, NP  aspirin EC 81 MG tablet Take 81 mg by mouth in the morning. Swallow whole.    [provider]  carvedilol (COREG) 6.25 MG tablet TAKE ONE TABLET BY MOUTH TWICE A DAY 04/30/23   Glori Luis, MD  dexamethasone 0.5 MG/5ML elixir Take 5 mLs (0.5 mg total) by mouth daily. Swish and spit 5 mL once in the morning and once at night. 08/02/23    Cruz Condon A, PA-C  etanercept (ENBREL) 50 MG/ML injection Inject into the skin.    [provider]  magic mouthwash (nystatin, lidocaine, diphenhydrAMINE, alum & mag hydroxide) suspension Swish and spit 5 mLs 4 (four) times daily as needed for mouth pain. Patient not taking: Reported on 08/08/2023 08/01/23   Valinda Hoar, NP  methotrexate (RHEUMATREX) 2.5 MG tablet Take 20 mg by mouth once a week. 09/10/22   [provider]  naproxen sodium (ALEVE) 220 MG tablet Take 220 mg by mouth daily as needed (pain.).    [provider]  olmesartan-hydrochlorothiazide (BENICAR HCT) 20-12.5 MG tablet TAKE ONE TABLET BY MOUTH ONCE A DAY 04/30/23   Glori Luis, MD  ondansetron (ZOFRAN-ODT) 4 MG disintegrating tablet Take 1 tablet (4 mg total) by mouth every 8 (eight) hours as needed for nausea or vomiting. 07/23/22   Glori Luis, MD  pantoprazole (PROTONIX) 40 MG tablet Take 1 tablet (40 mg total) by mouth daily. 06/06/22 04/24/23  Menshew, Charlesetta Ivory, PA-C  sertraline (ZOLOFT) 50 MG tablet TAKE ONE TABLET BY MOUTH DAILY 08/11/23  Glori Luis, MD  sucralfate (CARAFATE) 1 g tablet Take 1 tablet (1 g total) by mouth 4 (four) times daily -  with meals and at bedtime. 08/08/23   Dana Allan, MD    Physical Exam: Vitals:   08/12/23 1237 08/12/23 1238 08/12/23 1541 08/12/23 2012  BP: (!) 100/55  (!) 121/101 125/75  Pulse: 70  (!) 57 78  Resp: 17  16 20   Temp: 97.7 F (36.5 C)  (!) 97.4 F (36.3 C) 97.6 F (36.4 C)  TempSrc: Oral  Oral Oral  SpO2: 96%  100% 98%  Weight:  83 kg    Height:  5\' 2"  (1.575 m)     Physical Exam Vitals and nursing note reviewed.  Constitutional:      Appearance: She is normal weight.  HENT:     Head: Normocephalic and atraumatic.     Mouth/Throat:     Mouth: Mucous membranes are moist.     Pharynx: Oropharynx is clear.  Eyes:     Conjunctiva/sclera: Conjunctivae normal.     Pupils: Pupils are equal, round, and  reactive to light.  Cardiovascular:     Rate and Rhythm: Normal rate and regular rhythm.     Heart sounds: No murmur heard.    No gallop.  Pulmonary:     Effort: Pulmonary effort is normal. No respiratory distress.     Breath sounds: Normal breath sounds. No wheezing, rhonchi or rales.  Abdominal:     General: Bowel sounds are normal.     Palpations: Abdomen is soft.  Musculoskeletal:     Right lower leg: No edema.     Left lower leg: No edema.  Skin:    General: Skin is warm and dry.  Neurological:     General: No focal deficit present.     Mental Status: She is alert and oriented to person, place, and time. Mental status is at baseline.  Psychiatric:        Mood and Affect: Mood normal.        Behavior: Behavior normal.    Data Reviewed: CBC with WBC of 4.9, hemoglobin at 13.6, platelets of 336 BMP with sodium of 135, potassium 3.2, bicarb 22, glucose 135, BUN 23, creatinine 1.19 and GFR 49 Troponin 5 Urinalysis with ketonuria and increased Pacific gravity  EKG personally reviewed.  Sinus rhythm with rate of 76.  Borderline first-degree AV block.  T wave flattening.  No ST or T wave changes concerning for acute ischemia.  CT Angio Chest PE W and/or Wo Contrast  Result Date: 08/12/2023 CLINICAL DATA:  Shortness of breath. EXAM: CT ANGIOGRAPHY CHEST WITH CONTRAST TECHNIQUE: Multidetector CT imaging of the chest was performed using the standard protocol during bolus administration of intravenous contrast. Multiplanar CT image reconstructions and MIPs were obtained to evaluate the vascular anatomy. RADIATION DOSE REDUCTION: This exam was performed according to the departmental dose-optimization program which includes automated exposure control, adjustment of the mA and/or kV according to patient size and/or use of iterative reconstruction technique. CONTRAST:  75mL OMNIPAQUE IOHEXOL 350 MG/ML SOLN COMPARISON:  August 01, 2021. FINDINGS: Cardiovascular: Filling defect is seen in lower  lobe branch of right pulmonary artery consistent with pulmonary embolus. RV/LV ratio 1.25 is noted. No pericardial effusion. Mild coronary artery calcifications are noted. Mediastinum/Nodes: Small sliding-type hiatal hernia. No adenopathy. Thyroid gland is unremarkable. Lungs/Pleura: No pneumothorax or pleural effusion. Stable bilateral pulmonary nodules which can be considered benign. No acute pulmonary abnormality is noted. Upper Abdomen: No  acute abnormality. Musculoskeletal: No chest wall abnormality. No acute or significant osseous findings. Review of the MIP images confirms the above findings. IMPRESSION: Pulmonary embolus seen in lower lobe branch of right pulmonary artery. Although RV/LV ratio is elevated at 1.25, this embolus appears to be too peripheral to result in right heart strain. Critical Value/emergent results were called by telephone at the time of interpretation on 08/12/2023 at 5:41 pm to provider DAVID [facility] , who verbally acknowledged these results. Electronically Signed   By: Lupita Raider M.D.   On: 08/12/2023 17:42    Results are pending, will review when available.  Assessment and Plan:  Pulmonary embolism (HCC) Patient is presenting with 2-3-week history of dyspnea, diaphoresis and fatigue on exertion with sudden onset dizziness and vision difficulties today, found to be significantly hypotensive.  Previous history of PE/DVT requiring thrombolysis, thrombectomy, and 1 year of Xarelto.  CTA today with peripheral PE with RV/LV ratio elevated, however unusual for peripheral lesions such as this to cause right heart strain.  Troponin negative.  - Vascular surgery consulted by EDP; appreciate their recommendations - Telemetry monitoring - Heparin infusion per pharmacy dosing - IV fluids as ordered - Echocardiogram ordered - Lower extremity Dopplers - Can likely go home on Xarelto versus Eliquis - Would benefit from outpatient follow-up with hematology to assess for  hypercoagulability  AKI (acute kidney injury) (HCC) On admission, patient was noted to have an elevated creatinine at 1.19 with GFR 49.  Urinalysis with ketonuria and increased Pacific gravity suggesting dehydration.  - IV fluids as ordered - Renal ultrasound - Avoid nephrotoxic agents - Repeat BMP in the a.m.  Rheumatoid arthritis (HCC) Patient has a history of rheumatoid arthritis on Enbrel and methotrexate.  - Hold home medications while admitted  Hypertension - Hold home antihypertensives given high risk for hypotension  Advance Care Planning:   Code Status: Full Code   Consults: Vascular Surgery  Family Communication: No family at bedside  Severity of Illness: The appropriate patient status for this patient is OBSERVATION. Observation status is judged to be reasonable and necessary in order to provide the required intensity of service to ensure the patient's safety. The patient's presenting symptoms, physical exam findings, and initial radiographic and laboratory data in the context of their medical condition is felt to place them at decreased risk for further clinical deterioration. Furthermore, it is anticipated that the patient will be medically stable for discharge from the hospital within 2 midnights of admission.   Author: Verdene Lennert, MD 08/12/2023 8:15 PM  For on call review [email or web address].

## 2023-08-12 NOTE — Assessment & Plan Note (Signed)
Patient has a history of rheumatoid arthritis on Enbrel and methotrexate.  - Hold home medications while admitted

## 2023-08-12 NOTE — Progress Notes (Signed)
ANTICOAGULATION CONSULT NOTE - Initial Consult  Pharmacy Consult for heparin Indication: pulmonary embolus  Allergies  Allergen Reactions   Tramadol Other (See Comments)    Sedation     Patient Measurements: Height: 5\' 2"  (157.5 cm) Weight: 83 kg (182 lb 15.7 oz) IBW/kg (Calculated) : 50.1 Heparin Dosing Weight: 68.7 kg  Vital Signs: Temp: 97.4 F (36.3 C) (09/10 1541) Temp Source: Oral (09/10 1541) BP: 121/101 (09/10 1541) Pulse Rate: 57 (09/10 1541)  Labs: Recent Labs    08/12/23 1239  HGB 13.6  HCT 39.2  PLT 336  CREATININE 1.19*  TROPONINIHS 5    Estimated Creatinine Clearance: 44.6 mL/min (A) (by C-G formula based on SCr of 1.19 mg/dL (H)).   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/20/2020   a.) LEFT femoral, popliteal, posterior tibial, and paroneal veins; Tx'd with Rivaroxaban; b.) embolized on 03/21/2020 requiring PA thrombolysis and mechanical thrombectomy on 03/22/2020   Grade I diastolic dysfunction 03/21/2020   History of immunosuppression    long-term MTX for RA diagnosis   History of PSVT (paroxysmal supraventricular tachycardia) 07/22/2019   a.) brief 5 beat run noted on Zio   HLD (hyperlipidemia)    Hypertension    [place]idney [place]    [place]   Osteoarthritis    Plantar fascial fibromatosis    Pulmonary embolus (HCC) 03/21/2020   a.) BILATERAL; required PA thrombolysis and mechanical thrombectomy on 03/22/2020; b.) IVC filter placed prior to knee surgery on 12/12/2020 and removed on 04/19/2021   PVC (premature ventricular contraction)    Rheumatoid arthritis (HCC)    Valgus deformity of great toes, bilateral    Vitamin D deficiency    Assessment: 67 y/o female presenting with shortness of breath and hypotension. Of note, patient has prior history of bilateral PE (03/2020) previously on apixaban.  CT angio today shows evidence of acute, right-sided pulmonary embolism. Pharmacy has been consulted to initiate heparin infusion. Patient is no longer on anticoagulation PTA.  Baseline labs: Hgb 13.6, HCT 39.2, platelets 336  Goal of Therapy:  Heparin level 0.3-0.7 units/ml Monitor platelets by anticoagulation protocol: Yes   Plan:  Give heparin 4100 unit bolus x1 Start heparin infusion at 1100 units/hour Check 6-hour Anti-Xa level Monitor daily Anti-Xa levels while on heparin infusion Monitor CBC and signs/symptoms of bleeding  Thank you for involving pharmacy in this patient's care.   Rockwell Alexandria, PharmD Clinical Pharmacist 08/12/2023 6:08 PM

## 2023-08-12 NOTE — Assessment & Plan Note (Addendum)
Patient is presenting with 2-3-week history of dyspnea, diaphoresis and fatigue on exertion with sudden onset dizziness and vision difficulties today, found to be significantly hypotensive.  Previous history of PE/DVT requiring thrombolysis, thrombectomy, and 1 year of Xarelto.  CTA today with peripheral PE with RV/LV ratio elevated, however unusual for peripheral lesions such as this to cause right heart strain.  Troponin negative.  - Vascular surgery consulted by EDP; appreciate their recommendations - Telemetry monitoring - Heparin infusion per pharmacy dosing - IV fluids as ordered - Echocardiogram ordered - Lower extremity Dopplers - Can likely go home on Xarelto versus Eliquis - Would benefit from outpatient follow-up with hematology to assess for hypercoagulability

## 2023-08-13 ENCOUNTER — Observation Stay (HOSPITAL_BASED_OUTPATIENT_CLINIC_OR_DEPARTMENT_OTHER)
Admit: 2023-08-13 | Discharge: 2023-08-13 | Disposition: A | Discharge status: Home / Self Care | Payer: Medicare Other | Attending: Internal Medicine | Admitting: Internal Medicine

## 2023-08-13 ENCOUNTER — Ambulatory Visit: Payer: Medicare Other | Admitting: Dermatology

## 2023-08-13 ENCOUNTER — Observation Stay: Payer: Medicare Other

## 2023-08-13 DIAGNOSIS — R0602 Shortness of breath: Secondary | ICD-10-CM

## 2023-08-13 DIAGNOSIS — I2699 Other pulmonary embolism without acute cor pulmonale: Secondary | ICD-10-CM | POA: Diagnosis not present

## 2023-08-13 DIAGNOSIS — N281 Cyst of kidney, acquired: Secondary | ICD-10-CM | POA: Diagnosis not present

## 2023-08-13 DIAGNOSIS — Z0389 Encounter for observation for other suspected diseases and conditions ruled out: Secondary | ICD-10-CM | POA: Diagnosis not present

## 2023-08-13 LAB — HIV ANTIBODY (ROUTINE TESTING W REFLEX): HIV Screen 4th Generation wRfx: NONREACTIVE

## 2023-08-13 LAB — ECHOCARDIOGRAM COMPLETE
AR max vel: 2.56 cm2
AV Area VTI: 2.56 cm2
AV Area mean vel: 2.56 cm2
AV Mean grad: 3 mmHg
AV Peak grad: 6.2 mmHg
Ao pk vel: 1.24 m/s
Area-P 1/2: 3.08 cm2
Height: 62 in
MV VTI: 3.11 cm2
S' Lateral: 2.5 cm
Weight: 3068.8 [oz_av]

## 2023-08-13 LAB — HEPARIN LEVEL (UNFRACTIONATED)
Heparin Unfractionated: 0.52 [IU]/mL (ref 0.30–0.70)
Heparin Unfractionated: 0.61 [IU]/mL (ref 0.30–0.70)

## 2023-08-13 LAB — BASIC METABOLIC PANEL
Anion gap: 6 (ref 5–15)
BUN: 14 mg/dL (ref 8–23)
CO2: 26 mmol/L (ref 22–32)
Calcium: 8.5 mg/dL — ABNORMAL LOW (ref 8.9–10.3)
Chloride: 107 mmol/L (ref 98–111)
Creatinine, Ser: 0.73 mg/dL (ref 0.44–1.00)
GFR, Estimated: >60 mL/min (ref >60)
Glucose, Bld: 93 mg/dL (ref 70–99)
Potassium: 3.5 mmol/L (ref 3.5–5.1)
Sodium: 139 mmol/L (ref 135–145)

## 2023-08-13 LAB — CBC
HCT: 35.9 % — ABNORMAL LOW (ref 36.0–46.0)
Hemoglobin: 12.3 g/dL (ref 12.0–15.0)
MCH: 30.2 pg (ref 26.0–34.0)
MCHC: 34.3 g/dL (ref 30.0–36.0)
MCV: 88.2 fL (ref 80.0–100.0)
Platelets: 295 10*3/uL (ref 150–400)
RBC: 4.07 MIL/uL (ref 3.87–5.11)
RDW: 13.4 % (ref 11.5–15.5)
WBC: 4.6 10*3/uL (ref 4.0–10.5)
nRBC: 0 % (ref 0.0–0.2)

## 2023-08-13 LAB — MAGNESIUM: Magnesium: 2.2 mg/dL (ref 1.7–2.4)

## 2023-08-13 LAB — PHOSPHORUS: Phosphorus: 3 mg/dL (ref 2.5–4.6)

## 2023-08-13 MED ORDER — APIXABAN 5 MG PO TABS: 5.0000 mg | ORAL_TABLET | Freq: Two times a day (BID) | ORAL | Status: DC

## 2023-08-13 MED ORDER — APIXABAN 5 MG PO TABS: ORAL_TABLET | ORAL | 1 refills | Status: DC

## 2023-08-13 MED ORDER — DEXAMETHASONE 0.5 MG/5ML PO SOLN: 0.5000 mg | Freq: Two times a day (BID) | ORAL | Status: DC

## 2023-08-13 MED ORDER — APIXABAN 5 MG PO TABS
10.0000 mg | ORAL_TABLET | Freq: Two times a day (BID) | ORAL | Status: DC
  Administered 2023-08-13: 10 mg via ORAL
  Filled 2023-08-13: qty 2

## 2023-08-13 NOTE — Consult Note (Signed)
PHARMACY CONSULT NOTE - ELECTROLYTES  Pharmacy Consult for Electrolyte Monitoring and Replacement   Recent Labs: Height: 5\' 2"  (157.5 cm) Weight: 87 kg (191 lb 12.8 oz) IBW/kg (Calculated) : 50.1 Estimated Creatinine Clearance: 68 mL/min (by C-G formula based on SCr of 0.73 mg/dL). Potassium (mmol/L)  Date Value  08/13/2023 3.5   Magnesium (mg/dL)  Date Value  36/64/4034 2.2   Calcium (mg/dL)  Date Value  74/25/9563 8.5 (L)   Albumin (g/dL)  Date Value  87/56/4332 4.0   Phosphorus (mg/dL)  Date Value  95/18/8416 3.0   Sodium (mmol/L)  Date Value  08/13/2023 139  06/18/2022 141   Assessment  Cindy Stein is a 67 y.o. female presenting with hypotension, DOE. CTA showed PE. PMH significant for DVT/PE s/p thrombolysis, mechanical thrombectomy and Xarelto (no longer on [facility]), rheumatoid arthritis on Enbrel, hypertension, hyperlipidemia, prediabetes, aortic atherosclerosis . Pharmacy has been consulted to monitor and replace electrolytes.  Diet: regular  Goal of Therapy: Electrolytes WNL  Plan:  No replacement needed today  Check BMP with AM labs  Thank you for allowing pharmacy to be a part of this patient's care.  Celene Squibb, PharmD Clinical Pharmacist 08/13/2023 1:19 PM

## 2023-08-13 NOTE — Progress Notes (Signed)
ANTICOAGULATION CONSULT NOTE - Initial Consult  Pharmacy Consult for heparin Indication: pulmonary embolus  Allergies  Allergen Reactions   Tramadol Other (See Comments)    Sedation     Patient Measurements: Height: 5\' 2"  (157.5 cm) Weight: 87 kg (191 lb 12.8 oz) IBW/kg (Calculated) : 50.1 Heparin Dosing Weight: 68.7 kg  Vital Signs: Temp: 98.6 F (37 C) (09/10 2307) Temp Source: Oral (09/10 2012) BP: 111/70 (09/10 2307) Pulse Rate: 60 (09/10 2307)  Labs: Recent Labs    08/12/23 1239 08/12/23 1830 08/13/23 0043  HGB 13.6  --   --   HCT 39.2  --   --   PLT 336  --   --   HEPARINUNFRC  --  <0.10* 0.52  CREATININE 1.19*  --   --   TROPONINIHS 5  --   --     Estimated Creatinine Clearance: 45.7 mL/min (A) (by C-G formula based on SCr of 1.19 mg/dL (H)).   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/20/2020   a.) LEFT femoral, popliteal, posterior tibial, and paroneal veins; Tx'd with Rivaroxaban; b.) embolized on 03/21/2020 requiring PA thrombolysis and mechanical thrombectomy on 03/22/2020   Grade I diastolic dysfunction 03/21/2020   History of immunosuppression    long-term MTX for RA diagnosis   History of PSVT (paroxysmal supraventricular tachycardia) 07/22/2019   a.) brief 5 beat run noted on Zio   HLD (hyperlipidemia)    Hypertension    Kidney stone    in [place] (right)   Osteoarthritis    Plantar fascial fibromatosis    Pulmonary embolus (HCC) 03/21/2020   a.) BILATERAL; required PA thrombolysis and mechanical thrombectomy on 03/22/2020; b.) IVC filter placed prior to knee surgery on 12/12/2020 and removed on 04/19/2021   PVC (premature ventricular contraction)    Rheumatoid arthritis (HCC)    Valgus deformity of great toes, bilateral    Vitamin D deficiency    Assessment: 67 y/o female presenting with shortness of  breath and hypotension. Of note, patient has prior history of bilateral PE (03/2020) previously on apixaban. CT angio today shows evidence of acute, right-sided pulmonary embolism. Pharmacy has been consulted to initiate heparin infusion. Patient is no longer on anticoagulation PTA.  Baseline labs: Hgb 13.6, HCT 39.2, platelets 336  Goal of Therapy:  Heparin level 0.3-0.7 units/ml Monitor platelets by anticoagulation protocol: Yes   Plan:  9/11:  HL @ 0043 = 0.52, therapeutic X 1  - Will continue pt on current rate and recheck HL on 9/11 @ 0700.  Monitor daily Anti-Xa levels while on heparin infusion Monitor CBC and signs/symptoms of bleeding  Thank you for involving pharmacy in this patient's care.   Gwenetta Devos D Clinical Pharmacist 08/13/2023 1:04 AM

## 2023-08-13 NOTE — Progress Notes (Signed)
*  PRELIMINARY RESULTS* Echocardiogram 2D Echocardiogram has been performed.  Carolyne Fiscal 08/13/2023, 9:30 AM

## 2023-08-13 NOTE — Discharge Summary (Signed)
Cindy Stein ZOX:[id number] DOB: 1956/06/05 DOA: 08/12/2023  PCP: Glori Luis, MD  Admit date: 08/12/2023 Discharge date: 08/13/2023  Time spent: 35 minutes  Recommendations for Outpatient Follow-up:  Pcp f/u     Discharge Diagnoses:  Active Problems:   Pulmonary embolism (HCC)   AKI (acute kidney injury) (HCC)   Hypertension   Rheumatoid arthritis (HCC)   Discharge Condition: improved  Diet recommendation: heart healthy  Filed Weights   08/12/23 1238 08/12/23 2041  Weight: 83 kg 87 kg    History of present illness:  From admission h and p Cindy Stein is a 67 y.o. female with medical history significant of previous DVT/PE s/p thrombolysis, mechanical thrombectomy and Xarelto (no longer on [facility]), rheumatoid arthritis on Enbrel, hypertension, hyperlipidemia, prediabetes, aortic atherosclerosis, who presents to the ED due to low blood pressure.   Cindy Stein states that for the last 2-3 weeks, she has been experiencing dyspnea on exertion, with diaphoresis and fatigue when ambulating as well. She denies any lower extremity swelling. She denies any recent illness, chest pain or palpitations. Today, she was outside walking with her daughter she had sudden onset vision loss and dizziness. She went inside and her daughter took her BP and it was very low with systolics in the 60-70s. Due to this, she came to the ED.   Hospital Course:  Hx provoked PE/dvt in 2021 in setting of knee surgery now off anticoagulation, presenting with dyspnea and presyncope. Found to have new small PE. No evidence RH strain on CTA and TTE without significant findings. PVLs neg for DVT. Ambulated well with physical therapy, BP and o2 normal, asymptomatic, no home health needs identified. Treated initially with IV heparin, this was transitioned to apixaban. Discharged home with apixaban, advise holding home benicar until pcp f/u given presyncope at home. This appears to be an unprovoked PE and is her  second PE so probably merits indefinite anticoagulation. Mild AKI resolved with fluids. Advised to ensure up-to-date with routine cancer screenings.   Procedures: none   Consultations: none  Discharge Exam: Vitals:   08/13/23 0723 08/13/23 1151  BP: 110/66 111/74  Pulse: 61 69  Resp: 20 20  Temp: 98.5 F (36.9 C) 98.7 F (37.1 C)  SpO2: 96% 98%    General: NAD Cardiovascular: RRR Respiratory: CTAB  Discharge Instructions   Discharge Instructions     Diet - low sodium heart healthy   Complete by: As directed    Increase activity slowly   Complete by: As directed       Allergies as of 08/13/2023       Reactions   Tramadol Other (See Comments)   Sedation        Medication List     STOP taking these medications    amoxicillin-clavulanate 875-125 MG tablet Commonly known as: AUGMENTIN   aspirin EC 81 MG tablet   magic mouthwash (nystatin, lidocaine, diphenhydrAMINE, alum & mag hydroxide) suspension   naproxen sodium 220 MG tablet Commonly known as: ALEVE   olmesartan-hydrochlorothiazide 20-12.5 MG tablet Commonly known as: BENICAR HCT       TAKE these medications    apixaban 5 MG Tabs tablet Commonly known as: ELIQUIS Take 2 tablets (10 mg total) by mouth 2 (two) times daily for 7 days, THEN 1 tablet (5 mg total) 2 (two) times daily. Start taking on: August 13, 2023   carvedilol 6.25 MG tablet Commonly known as: COREG TAKE ONE TABLET BY MOUTH TWICE A DAY   dexamethasone 0.5 MG/5ML  elixir Take 5 mLs (0.5 mg total) by mouth daily. Swish and spit 5 mL once in the morning and once at night.   etanercept 50 MG/ML injection Commonly known as: ENBREL Inject into the skin.   folic acid 1 MG tablet Commonly known as: FOLVITE Take 1 mg by mouth daily.   methocarbamol 500 MG tablet Commonly known as: ROBAXIN Take 500 mg by mouth 3 (three) times daily.   methotrexate 2.5 MG tablet Commonly known as: RHEUMATREX Take 20 mg by mouth once a  week.   ondansetron 4 MG disintegrating tablet Commonly known as: ZOFRAN-ODT Take 1 tablet (4 mg total) by mouth every 8 (eight) hours as needed for nausea or vomiting.   pantoprazole 40 MG tablet Commonly known as: PROTONIX Take 1 tablet (40 mg total) by mouth daily.   sertraline 50 MG tablet Commonly known as: ZOLOFT TAKE ONE TABLET BY MOUTH DAILY   sucralfate 1 g tablet Commonly known as: Carafate Take 1 tablet (1 g total) by mouth 4 (four) times daily -  with meals and at bedtime.       Allergies  Allergen Reactions   Tramadol Other (See Comments)    Sedation     Follow-up Information     Glori Luis, MD Follow up.   Specialty: Family Medicine Contact information: [address] [place] [address] [phone]                  The results of significant diagnostics from this hospitalization (including imaging, microbiology, ancillary and laboratory) are listed below for reference.    Significant Diagnostic Studies: ECHOCARDIOGRAM COMPLETE  Result Date: 08/13/2023    ECHOCARDIOGRAM REPORT   Patient Name:   Cindy Stein Date of Exam: 08/13/2023 Medical Rec #:  [id number]        Height:       62.0 in Accession #:    [id number]       Weight:       191.8 lb Date of Birth:  1955-12-16         BSA:          1.878 m Patient Age:    67 years         BP:           110/66 mmHg Patient Gender: F                HR:           63 bpm. Exam Location:  [facility] Procedure: 2D Echo, Cardiac Doppler and Color Doppler Indications:     Pulmonary embolus  History:         Patient has prior history of Echocardiogram examinations, most                  recent 08/02/2021. Arrythmias:PVC, Signs/Symptoms:Chest Pain;                  Risk Factors:Hypertension.  Sonographer:     Mikki Harbor Referring Phys:  [id number] Verdene Lennert Diagnosing Phys: Julien Nordmann MD  Sonographer Comments: Technically difficult study due to poor echo windows and patient is obese.  IMPRESSIONS  1. Left ventricular ejection fraction, by estimation, is 60 to 65%. The left ventricle has normal function. The left ventricle has no regional wall motion abnormalities. Left ventricular diastolic parameters are consistent with Grade I diastolic dysfunction (impaired relaxation).  2. Right ventricular systolic function is normal. The right ventricular size is mildly enlarged. Tricuspid regurgitation signal  is inadequate for assessing PA pressure.  3. The mitral valve is normal in structure. Mild mitral valve regurgitation. No evidence of mitral stenosis.  4. The aortic valve is tricuspid. There is mild calcification of the aortic valve. Aortic valve regurgitation is mild. Aortic valve sclerosis is present, with no evidence of aortic valve stenosis.  5. The inferior vena cava is normal in size with greater than 50% respiratory variability, suggesting right atrial pressure of 3 mmHg. FINDINGS  Left Ventricle: Left ventricular ejection fraction, by estimation, is 60 to 65%. The left ventricle has normal function. The left ventricle has no regional wall motion abnormalities. The left ventricular internal cavity size was normal in size. There is  no left ventricular hypertrophy. Left ventricular diastolic parameters are consistent with Grade I diastolic dysfunction (impaired relaxation). Right Ventricle: The right ventricular size is mildly enlarged. No increase in right ventricular wall thickness. Right ventricular systolic function is normal. Tricuspid regurgitation signal is inadequate for assessing PA pressure. Left Atrium: Left atrial size was normal in size. Right Atrium: Right atrial size was normal in size. Pericardium: There is no evidence of pericardial effusion. Mitral Valve: The mitral valve is normal in structure. Mild mitral valve regurgitation. No evidence of mitral valve stenosis. MV peak gradient, 2.8 mmHg. The mean mitral valve gradient is 1.0 mmHg. Tricuspid Valve: The tricuspid valve is  normal in structure. Tricuspid valve regurgitation is not demonstrated. No evidence of tricuspid stenosis. Aortic Valve: The aortic valve is tricuspid. There is mild calcification of the aortic valve. Aortic valve regurgitation is mild. Aortic valve sclerosis is present, with no evidence of aortic valve stenosis. Aortic valve mean gradient measures 3.0 mmHg. Aortic valve peak gradient measures 6.2 mmHg. Aortic valve area, by VTI measures 2.56 cm. Pulmonic Valve: The pulmonic valve was normal in structure. Pulmonic valve regurgitation is not visualized. No evidence of pulmonic stenosis. Aorta: The aortic root is normal in size and structure. Venous: The inferior vena cava is normal in size with greater than 50% respiratory variability, suggesting right atrial pressure of 3 mmHg. IAS/Shunts: No atrial level shunt detected by color flow Doppler.  LEFT VENTRICLE PLAX 2D LVIDd:         4.50 cm   Diastology LVIDs:         2.50 cm   LV e' medial:    8.27 cm/s LV PW:         1.00 cm   LV E/e' medial:  6.9 LV IVS:        0.90 cm   LV e' lateral:   7.94 cm/s LVOT diam:     2.00 cm   LV E/e' lateral: 7.2 LV SV:         74 LV SV Index:   39 LVOT Area:     3.14 cm  RIGHT VENTRICLE RV Basal diam:  3.40 cm RV Mid diam:    3.30 cm RV S prime:     12.00 cm/s TAPSE (M-mode): 3.2 cm LEFT ATRIUM             Index        RIGHT ATRIUM           Index LA diam:        3.60 cm 1.92 cm/m   RA Area:     12.40 cm LA Vol (A2C):   34.6 ml 18.42 ml/m  RA Volume:   28.50 ml  15.18 ml/m LA Vol (A4C):   41.8 ml 22.26 ml/m LA Biplane  Vol: 38.0 ml 20.23 ml/m  AORTIC VALVE                    PULMONIC VALVE AV Area (Vmax):    2.56 cm     PV Vmax:       1.09 m/s AV Area (Vmean):   2.56 cm     PV Peak grad:  4.8 mmHg AV Area (VTI):     2.56 cm AV Vmax:           124.00 cm/s AV Vmean:          79.500 cm/s AV VTI:            0.287 m AV Peak Grad:      6.2 mmHg AV Mean Grad:      3.0 mmHg LVOT Vmax:         101.00 cm/s LVOT Vmean:        64.800  cm/s LVOT VTI:          0.234 m LVOT/AV VTI ratio: 0.82  AORTA Ao Root diam: 3.20 cm MITRAL VALVE MV Area (PHT): 3.08 cm    SHUNTS MV Area VTI:   3.11 cm    Systemic VTI:  0.23 m MV Peak grad:  2.8 mmHg    Systemic Diam: 2.00 cm MV Mean grad:  1.0 mmHg MV Vmax:       0.83 m/s MV Vmean:      45.4 cm/s MV Decel Time: 246 msec MV E velocity: 57.10 cm/s MV A velocity: 84.20 cm/s MV E/A ratio:  0.68 Julien Nordmann MD Electronically signed by Julien Nordmann MD Signature Date/Time: 08/13/2023/1:35:27 PM    Final    US RENAL  Result Date: 08/13/2023 CLINICAL DATA:  Acute kidney insufficiency EXAM: RENAL / URINARY TRACT ULTRASOUND COMPLETE COMPARISON:  Ultrasound 06/05/2022.  Contrast CT 06/05/2022. FINDINGS: Right Kidney: Renal measurements: 11.6 x 5.3 x 5.3 cm = volume: 170 mL. No collecting system dilatation or perinephric fluid. There is a anechoic structure identified towards the lower pole measuring 6.3 x 6.7 x 6.3 cm with some thin septa. Left Kidney: Renal measurements: 12.1 x 5.4 x 5.1 cm = volume: 174 mL. No collecting system dilatation. Anechoic structure with through transmission and smooth margins identified in the left kidney measuring 2.9 x 2.8 x 3.0 cm. Simple cysts. Bladder: Bladder is mildly distended and has a preserved contour. There is some nodular thickening dependently along the bladder measuring 3.8 x 1.0 x 3.1 cm of uncertain etiology. Other: None. IMPRESSION: No collecting system dilatation. Stable benign appearing renal cysts as seen on prior CT scan. Plaque-like area of thickening along the posterior aspect of the bladder measuring 3.8 x 1.0 x 3.1 cm of uncertain etiology and significance. Additional workup when clinically appropriate Electronically Signed   By: Karen Kays M.D.   On: 08/13/2023 13:02   US Venous Img Lower Bilateral (DVT)  Result Date: 08/13/2023 CLINICAL DATA:  History of pulmonary embolism.  Evaluate for DVT. EXAM: BILATERAL LOWER EXTREMITY VENOUS DOPPLER ULTRASOUND  TECHNIQUE: Gray-scale sonography with graded compression, as well as color Doppler and duplex ultrasound were performed to evaluate the lower extremity deep venous systems from the level of the common femoral vein and including the common femoral, femoral, profunda femoral, popliteal and calf veins including the posterior tibial, peroneal and gastrocnemius veins when visible. The superficial great saphenous vein was also interrogated. Spectral Doppler was utilized to evaluate flow at rest and with distal augmentation maneuvers in the common  femoral, femoral and popliteal veins. COMPARISON:  Chest CT-08/12/2023 FINDINGS: RIGHT LOWER EXTREMITY Common Femoral Vein: No evidence of thrombus. Normal compressibility, respiratory phasicity and response to augmentation. Saphenofemoral Junction: No evidence of thrombus. Normal compressibility and flow on color Doppler imaging. Profunda Femoral Vein: No evidence of thrombus. Normal compressibility and flow on color Doppler imaging. Femoral Vein: No evidence of thrombus. Normal compressibility, respiratory phasicity and response to augmentation. Popliteal Vein: No evidence of thrombus. Normal compressibility, respiratory phasicity and response to augmentation. Calf Veins: No evidence of thrombus. Normal compressibility and flow on color Doppler imaging. Superficial Great Saphenous Vein: No evidence of thrombus. Normal compressibility. Other Findings:  None. LEFT LOWER EXTREMITY Common Femoral Vein: No evidence of thrombus. Normal compressibility, respiratory phasicity and response to augmentation. Saphenofemoral Junction: No evidence of thrombus. Normal compressibility and flow on color Doppler imaging. Profunda Femoral Vein: No evidence of thrombus. Normal compressibility and flow on color Doppler imaging. Femoral Vein: No evidence of thrombus. Normal compressibility, respiratory phasicity and response to augmentation. Popliteal Vein: No evidence of thrombus. Normal  compressibility, respiratory phasicity and response to augmentation. Calf Veins: No evidence of thrombus. Normal compressibility and flow on color Doppler imaging. Superficial Great Saphenous Vein: No evidence of thrombus. Normal compressibility. Other Findings:  None. IMPRESSION: No evidence of DVT within either lower extremity. Electronically Signed   By: Simonne Come M.D.   On: 08/13/2023 11:32   CT Angio Chest PE W and/or Wo Contrast  Result Date: 08/12/2023 CLINICAL DATA:  Shortness of breath. EXAM: CT ANGIOGRAPHY CHEST WITH CONTRAST TECHNIQUE: Multidetector CT imaging of the chest was performed using the standard protocol during bolus administration of intravenous contrast. Multiplanar CT image reconstructions and MIPs were obtained to evaluate the vascular anatomy. RADIATION DOSE REDUCTION: This exam was performed according to the departmental dose-optimization program which includes automated exposure control, adjustment of the mA and/or kV according to patient size and/or use of iterative reconstruction technique. CONTRAST:  75mL OMNIPAQUE IOHEXOL 350 MG/ML SOLN COMPARISON:  August 01, 2021. FINDINGS: Cardiovascular: Filling defect is seen in lower lobe branch of right pulmonary artery consistent with pulmonary embolus. RV/LV ratio 1.25 is noted. No pericardial effusion. Mild coronary artery calcifications are noted. Mediastinum/Nodes: Small sliding-type hiatal hernia. No adenopathy. Thyroid gland is unremarkable. Lungs/Pleura: No pneumothorax or pleural effusion. Stable bilateral pulmonary nodules which can be considered benign. No acute pulmonary abnormality is noted. Upper Abdomen: No acute abnormality. Musculoskeletal: No chest wall abnormality. No acute or significant osseous findings. Review of the MIP images confirms the above findings. IMPRESSION: Pulmonary embolus seen in lower lobe branch of right pulmonary artery. Although RV/LV ratio is elevated at 1.25, this embolus appears to be too  peripheral to result in right heart strain. Critical Value/emergent results were called by telephone at the time of interpretation on 08/12/2023 at 5:41 pm to provider [facility] , who verbally acknowledged these results. Electronically Signed   By: Lupita Raider M.D.   On: 08/12/2023 17:42    Microbiology: No results found for this or any previous visit (from the past 240 hour(s)).   Labs: Basic Metabolic Panel: Recent Labs  Lab 08/12/23 1239 08/13/23 0712  NA 135 139  K 3.2* 3.5  CL 104 107  CO2 22 26  GLUCOSE 135* 93  BUN 23 14  CREATININE 1.19* 0.73  CALCIUM 9.1 8.5*  MG  --  2.2  PHOS  --  3.0   Liver Function Tests: No results for input(s): "AST", "ALT", "ALKPHOS", "BILITOT", "PROT", "ALBUMIN"  in the last 168 hours. No results for input(s): "LIPASE", "AMYLASE" in the last 168 hours. No results for input(s): "AMMONIA" in the last 168 hours. CBC: Recent Labs  Lab 08/12/23 1239 08/13/23 0712  WBC 4.9 4.6  HGB 13.6 12.3  HCT 39.2 35.9*  MCV 88.3 88.2  PLT 336 295   Cardiac Enzymes: No results for input(s): "CKTOTAL", "CKMB", "CKMBINDEX", "TROPONINI" in the last 168 hours. BNP: BNP (last 3 results) No results for input(s): "BNP" in the last 8760 hours.  ProBNP (last 3 results) No results for input(s): "PROBNP" in the last 8760 hours.  CBG: No results for input(s): "GLUCAP" in the last 168 hours.     Signed:  Silvano Bilis MD.  [facility] 08/13/2023, 2:11 PM

## 2023-08-13 NOTE — Progress Notes (Signed)
ANTICOAGULATION CONSULT NOTE  Pharmacy Consult for Apixaban Indication: pulmonary embolus  Allergies  Allergen Reactions   Tramadol Other (See Comments)    Sedation     Patient Measurements: Height: 5\' 2"  (157.5 cm) Weight: 87 kg (191 lb 12.8 oz) IBW/kg (Calculated) : 50.1 Heparin Dosing Weight: 68.7 kg  Vital Signs: Temp: 98.7 F (37.1 C) (09/11 1151) BP: 111/74 (09/11 1151) Pulse Rate: 69 (09/11 1151)  Labs: Recent Labs    08/12/23 1239 08/12/23 1830 08/13/23 0043 08/13/23 0712  HGB 13.6  --   --  12.3  HCT 39.2  --   --  35.9*  PLT 336  --   --  295  HEPARINUNFRC  --  <0.10* 0.52 0.61  CREATININE 1.19*  --   --  0.73  TROPONINIHS 5  --   --   --     Estimated Creatinine Clearance: 68 mL/min (by C-G formula based on SCr of 0.73 mg/dL).   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/20/2020   a.) LEFT femoral, popliteal, posterior tibial, and paroneal veins; Tx'd with Rivaroxaban; b.) embolized on 03/21/2020 requiring PA thrombolysis and mechanical thrombectomy on 03/22/2020   Grade I diastolic dysfunction 03/21/2020   History of immunosuppression    long-term MTX for RA diagnosis   History of PSVT (paroxysmal supraventricular tachycardia) 07/22/2019   a.) brief 5 beat run noted on Zio   HLD (hyperlipidemia)    Hypertension    Kidney [place]    in [place] ([place]   Osteoarthritis    Plantar fascial fibromatosis    Pulmonary embolus (HCC) 03/21/2020   a.) BILATERAL; required PA thrombolysis and mechanical thrombectomy on 03/22/2020; b.) IVC filter placed prior to knee surgery on 12/12/2020 and removed on 04/19/2021   PVC (premature ventricular contraction)    Rheumatoid arthritis (HCC)    Valgus deformity of great toes, bilateral    Vitamin D deficiency    Assessment: Cindy Stein is a 67 y.o. female presenting with  SOB and hypotension. PMH significant for h/o DVT/PE (no longer on AC), RA (on Enbrel), HTN, HLD, prediabetes, aortic atherosclerosis. Patient was not on [facility] PTA per chart review. CT angio shows evidence of acute, right-sided pulmonary embolism. Pharmacy has been consulted to dose apixaban.   Baseline Labs: HL <0.10, Hgb 13.6, Hct 39.2, Plt 336   Goal of Therapy:  Heparin level 0.3-0.7 units/ml Monitor platelets by anticoagulation protocol: Yes  Plan:  Discontinue heparin infusion Start apixaban 10 mg BID x7d then 5 mg BID thereafter Pharmacy will sign off and continue to monitor peripherally     Thank you for involving pharmacy in this patient's care.   Celene Squibb, PharmD Clinical Pharmacist 08/13/2023 1:18 PM

## 2023-08-13 NOTE — Evaluation (Addendum)
Physical Therapy Evaluation Patient Details Name: Cindy Stein MRN: [id number] DOB: [birth year] Today's Date: 08/13/2023  History of Present Illness  Pt is a 67y/o female presenting due to intial complaints of hypotension, chest tightness, dyspnea with exertion, and dizziness. Further medical workup revealed pulmonary embolus seen in lower lobe branch of right pulmonary artery. Cleared by hospitalist for PT evaluation with acute PE. PMH includes HTN, anxiety, depression, PE (3 years ago), DVT, rheumatoid arthritis, HLD, and prediabetes.  Clinical Impression   Pt presents laying in bed, no complaints of pain. She currently lives with her husband in a house with a level entry. PTA she was independent with all mobility and ADLs.   PT strength screening revealed 4-5/5 MMT (see extremity assessment). She is able to perform bed mobility modi, transfers modi, and ambulate ~43ft CGA (provided but not required). Pt performed DGI revealing overall normal balance. She voices she is at her baseline functioning and has no PT concerns to be addressed at this time. SPO2 remained >92% on RA throughout session. PT to sign off, please re-consult to reevaluate if change in functional status occurs.       If plan is discharge home, recommend the following: Assist for transportation   Can travel by private vehicle        Equipment Recommendations None recommended by PT  Recommendations for Other Services       Functional Status Assessment Patient has not had a recent decline in their functional status     Precautions / Restrictions Precautions Precautions: None Restrictions Weight Bearing Restrictions: No      Mobility  Bed Mobility Overal bed mobility: Modified Independent                  Transfers Overall transfer level: Modified independent                      Ambulation/Gait Ambulation/Gait assistance: Contact guard assist Gait Distance (Feet): 450 Feet Assistive  device: None Gait Pattern/deviations: WFL(Within Functional Limits) Gait velocity: WNL     General Gait Details: CGA but overall not needed  Stairs            Wheelchair Mobility     Tilt Bed    Modified Rankin (Stroke Patients Only)       Balance Overall balance assessment: Modified Independent                               Standardized Balance Assessment Standardized Balance Assessment : Dynamic Gait Index   Dynamic Gait Index Level Surface: Normal Change in Gait Speed: Normal Gait with Horizontal Head Turns: Normal Gait with Vertical Head Turns: Normal Gait and Pivot Turn: Normal Step Over Obstacle: Normal Step Around Obstacles: Normal Steps: Mild Impairment Total Score: 23       Pertinent Vitals/Pain Pain Assessment Pain Assessment: No/denies pain    Home Living Family/patient expects to be discharged to:: Private residence Living Arrangements: Spouse/significant other;Children (daughter lives next door) Available Help at Discharge: Family Type of Home: House Home Access: Level entry       Home Layout: One level Home Equipment: None      Prior Function Prior Level of Function : Independent/Modified Independent             Mobility Comments: does not use AD at baseline ADLs Comments: IND     Extremity/Trunk Assessment   Upper Extremity Assessment Upper Extremity Assessment: Overall [facility]  for tasks assessed    Lower Extremity Assessment Lower Extremity Assessment: Overall WFL for tasks assessed       Communication   Communication Communication: No apparent difficulties  Cognition Arousal: Alert Behavior During Therapy: WFL for tasks assessed/performed Overall Cognitive Status: Within Functional Limits for tasks assessed                                          General Comments General comments (skin integrity, edema, etc.): SPO2 remained >92% on RA    Exercises     Assessment/Plan    PT  Assessment Patient does not need any further PT services  PT Problem List         PT Treatment Interventions      PT Goals (Current goals can be found in the Care Plan section)       Frequency       Co-evaluation               AM-PAC PT "6 Clicks" Mobility  Outcome Measure Help needed turning from your back to your side while in a flat bed without using bedrails?: None Help needed moving from lying on your back to sitting on the side of a flat bed without using bedrails?: None Help needed moving to and from a bed to a chair (including a wheelchair)?: None Help needed standing up from a chair using your arms (e.g., wheelchair or bedside chair)?: None Help needed to walk in hospital room?: None Help needed climbing 3-5 steps with a railing? : None 6 Click Score: 24    End of Session Equipment Utilized During Treatment: Gait belt Activity Tolerance: Patient tolerated treatment well Patient left: in bed;with call bell/phone within reach        Time: 1317-1335 PT Time Calculation (min) (ACUTE ONLY): 18 min   Charges:   PT Evaluation $PT Eval Low Complexity: 1 Low PT Treatments $Therapeutic Activity: 8-22 mins PT General Charges $$ ACUTE PT VISIT: 1 Visit         Micael Barb, PT, SPT 3:29 PM,08/13/23

## 2023-08-13 NOTE — Progress Notes (Signed)
ANTICOAGULATION CONSULT NOTE  Pharmacy Consult for Heparin Infusion Indication: pulmonary embolus  Allergies  Allergen Reactions   Tramadol Other (See Comments)    Sedation     Patient Measurements: Height: 5\' 2"  (157.5 cm) Weight: 87 kg (191 lb 12.8 oz) IBW/kg (Calculated) : 50.1 Heparin Dosing Weight: 68.7 kg  Vital Signs: Temp: 98.5 F (36.9 C) (09/11 0723) Temp Source: Oral (09/10 2012) BP: 110/66 (09/11 0723) Pulse Rate: 61 (09/11 0723)  Labs: Recent Labs    08/12/23 1239 08/12/23 1830 08/13/23 0043 08/13/23 0712  HGB 13.6  --   --   --   HCT 39.2  --   --   --   PLT 336  --   --   --   HEPARINUNFRC  --  <0.10* 0.52 0.61  CREATININE 1.19*  --   --   --   TROPONINIHS 5  --   --   --     Estimated Creatinine Clearance: 45.7 mL/min (A) (by C-G formula based on SCr of 1.19 mg/dL (H)).   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/20/2020   a.) LEFT femoral, popliteal, posterior tibial, and paroneal veins; Tx'd with Rivaroxaban; b.) embolized on 03/21/2020 requiring PA thrombolysis and mechanical thrombectomy on 03/22/2020   Grade I diastolic dysfunction 03/21/2020   History of immunosuppression    long-term MTX for RA diagnosis   History of PSVT (paroxysmal supraventricular tachycardia) 07/22/2019   a.) brief 5 beat run noted on Zio   HLD (hyperlipidemia)    Hypertension    [place]id[place]    [place]   Osteoarthritis    Plantar fascial fibromatosis    Pulmonary embolus (HCC) 03/21/2020   a.) BILATERAL; required PA thrombolysis and mechanical thrombectomy on 03/22/2020; b.) IVC filter placed prior to knee surgery on 12/12/2020 and removed on 04/19/2021   PVC (premature ventricular contraction)    Rheumatoid arthritis (HCC)    Valgus deformity of great toes, bilateral    Vitamin D deficiency     Assessment: Cindy Stein is a 67 y.o. female presenting with SOB and hypotension. PMH significant for h/o DVT/PE (no longer on AC), RA (on Enbrel), HTN, HLD, prediabetes, aortic atherosclerosis. Patient was not on [facility] PTA per chart review. CT angio shows evidence of acute, right-sided pulmonary embolism. Pharmacy has been consulted to initiate and manage heparin infusion.   Baseline Labs: HL <0.10, Hgb 13.6, Hct 39.2, Plt 336   Baseline labs: Hgb 13.6, HCT 39.2, platelets 336  Goal of Therapy:  Heparin level 0.3-0.7 units/ml Monitor platelets by anticoagulation protocol: Yes   Date Time HL Rate/Comment  9/11 0043 0.52 1100/therapeutic x1 9/11 0712 0.61 1100/therapeutic x2  Plan:  Continue heparin infusion at 1100 units/hr Check HL daily while therapeutic on heparin infusion  Continue to monitor H&H and platelets daily while on heparin infusion   Thank you for involving pharmacy in this patient's care.   Celene Squibb, PharmD Clinical Pharmacist 08/13/2023 7:53 AM

## 2023-08-13 NOTE — Progress Notes (Signed)
ANTICOAGULATION CONSULT NOTE  Pharmacy Consult for Heparin Infusion Indication: pulmonary embolus  Allergies  Allergen Reactions   Tramadol Other (See Comments)    Sedation     Patient Measurements: Height: 5\' 2"  (157.5 cm) Weight: 87 kg (191 lb 12.8 oz) IBW/kg (Calculated) : 50.1 Heparin Dosing Weight: 68.7 kg  Vital Signs: Temp: 98.7 F (37.1 C) (09/11 1151) BP: 111/74 (09/11 1151) Pulse Rate: 69 (09/11 1151)  Labs: Recent Labs    08/12/23 1239 08/12/23 1830 08/13/23 0043 08/13/23 0712  HGB 13.6  --   --  12.3  HCT 39.2  --   --  35.9*  PLT 336  --   --  295  HEPARINUNFRC  --  <0.10* 0.52 0.61  CREATININE 1.19*  --   --  0.73  TROPONINIHS 5  --   --   --     Estimated Creatinine Clearance: 68 mL/min (by C-G formula based on SCr of 0.73 mg/dL).   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/20/2020   a.) LEFT femoral, popliteal, posterior tibial, and paroneal veins; Tx'd with Rivaroxaban; b.) embolized on 03/21/2020 requiring PA thrombolysis and mechanical thrombectomy on 03/22/2020   Grade I diastolic dysfunction 03/21/2020   History of immunosuppression    long-term MTX for RA diagnosis   History of PSVT (paroxysmal supraventricular tachycardia) 07/22/2019   a.) brief 5 beat run noted on Zio   HLD (hyperlipidemia)    Hypertension    Kidney stone    in [place] (right)   Osteoarthritis    Plantar fascial fibromatosis    Pulmonary embolus (HCC) 03/21/2020   a.) BILATERAL; required PA thrombolysis and mechanical thrombectomy on 03/22/2020; b.) IVC filter placed prior to knee surgery on 12/12/2020 and removed on 04/19/2021   PVC (premature ventricular contraction)    Rheumatoid arthritis (HCC)    Valgus deformity of great toes, bilateral    Vitamin D deficiency    Assessment: Cindy Stein is a 67 y.o. female  presenting with SOB and hypotension. PMH significant for h/o DVT/PE (no longer on AC), RA (on Enbrel), HTN, HLD, prediabetes, aortic atherosclerosis. Patient was not on [facility] PTA per chart review. CT angio shows evidence of acute, right-sided pulmonary embolism. Pharmacy has been consulted to initiate and manage heparin infusion.   Baseline Labs: HL <0.10, Hgb 13.6, Hct 39.2, Plt 336   Goal of Therapy:  Heparin level 0.3-0.7 units/ml Monitor platelets by anticoagulation protocol: Yes   Date Time HL Rate/Comment  9/11 0043 0.52 1100/therapeutic x1 9/11 0712 0.61 1100/therapeutic x2  Plan:  Continue heparin infusion at 1100 units/hr Check HL daily while therapeutic on heparin infusion  Continue to monitor H&H and platelets daily while on heparin infusion   Thank you for involving pharmacy in this patient's care.   Celene Squibb, PharmD Clinical Pharmacist 08/13/2023 1:23 PM

## 2023-08-14 NOTE — Telephone Encounter (Signed)
Per Dr. Gwen Pounds: "I reviewed pts chart. She had fillers on 07/15/2023. It was 2 weeks later before pt had problems with Aphthous ulcers of mouth, tongue and gums. This is NOT consistent with any side effect or relation to her filler product or procedure."  Sent MyChart message. aw

## 2023-08-18 ENCOUNTER — Ambulatory Visit (INDEPENDENT_AMBULATORY_CARE_PROVIDER_SITE_OTHER): Payer: Medicare Other | Admitting: Family Medicine

## 2023-08-18 ENCOUNTER — Telehealth: Payer: Self-pay

## 2023-08-18 ENCOUNTER — Encounter: Payer: Self-pay | Admitting: Family Medicine

## 2023-08-18 ENCOUNTER — Other Ambulatory Visit: Payer: Self-pay | Admitting: Family Medicine

## 2023-08-18 VITALS — BP 110/68 | HR 64 | Temp 98.0°F | Ht 62.0 in | Wt 197.2 lb

## 2023-08-18 DIAGNOSIS — E7800 Pure hypercholesterolemia, unspecified: Secondary | ICD-10-CM | POA: Diagnosis not present

## 2023-08-18 DIAGNOSIS — M9905 Segmental and somatic dysfunction of pelvic region: Secondary | ICD-10-CM | POA: Diagnosis not present

## 2023-08-18 DIAGNOSIS — M069 Rheumatoid arthritis, unspecified: Secondary | ICD-10-CM

## 2023-08-18 DIAGNOSIS — L659 Nonscarring hair loss, unspecified: Secondary | ICD-10-CM | POA: Insufficient documentation

## 2023-08-18 DIAGNOSIS — I10 Essential (primary) hypertension: Secondary | ICD-10-CM

## 2023-08-18 DIAGNOSIS — I2699 Other pulmonary embolism without acute cor pulmonale: Secondary | ICD-10-CM

## 2023-08-18 DIAGNOSIS — M5417 Radiculopathy, lumbosacral region: Secondary | ICD-10-CM | POA: Diagnosis not present

## 2023-08-18 DIAGNOSIS — N179 Acute kidney failure, unspecified: Secondary | ICD-10-CM | POA: Diagnosis not present

## 2023-08-18 DIAGNOSIS — M9903 Segmental and somatic dysfunction of lumbar region: Secondary | ICD-10-CM | POA: Diagnosis not present

## 2023-08-18 DIAGNOSIS — M5416 Radiculopathy, lumbar region: Secondary | ICD-10-CM | POA: Diagnosis not present

## 2023-08-18 LAB — LIPID PANEL
Cholesterol: 178 mg/dL (ref 0–200)
HDL: 64.1 mg/dL (ref >39.00)
LDL Cholesterol: 91 mg/dL (ref 0–99)
NonHDL: 114.3
Total CHOL/HDL Ratio: 3
Triglycerides: 119 mg/dL (ref 0.0–149.0)
VLDL: 23.8 mg/dL (ref 0.0–40.0)

## 2023-08-18 LAB — COMPREHENSIVE METABOLIC PANEL
ALT: 26 U/L (ref 0–35)
AST: 23 U/L (ref 0–37)
Albumin: 3.4 g/dL — ABNORMAL LOW (ref 3.5–5.2)
Alkaline Phosphatase: 68 U/L (ref 39–117)
BUN: 16 mg/dL (ref 6–23)
CO2: 27 meq/L (ref 19–32)
Calcium: 9 mg/dL (ref 8.4–10.5)
Chloride: 107 meq/L (ref 96–112)
Creatinine, Ser: 0.88 mg/dL (ref 0.40–1.20)
GFR: 67 mL/min (ref >60.00)
Glucose, Bld: 77 mg/dL (ref 70–99)
Potassium: 4.4 meq/L (ref 3.5–5.1)
Sodium: 140 meq/L (ref 135–145)
Total Bilirubin: 0.4 mg/dL (ref 0.2–1.2)
Total Protein: 6.3 g/dL (ref 6.0–8.3)

## 2023-08-18 NOTE — Telephone Encounter (Signed)
Transition Care Management Unsuccessful Follow-up Telephone Call  Date of discharge and from where:  [place] 8/31  Attempts:  1st Attempt  Reason for unsuccessful TCM follow-up call:  No answer/busy   Lenard Forth [place]  [facility], [facility] Guide, Phone: [phone] Website: [email or web address]

## 2023-08-18 NOTE — Assessment & Plan Note (Signed)
Patient reports her insurance checked her cholesterol recently and it was elevated.  She wonders what needs to be done for this.  We will recheck her cholesterol today.

## 2023-08-18 NOTE — Progress Notes (Signed)
Marikay Alar, MD Phone: [phone]  Cindy Stein is a 67 y.o. female who presents today for f/u.  Pulmonary embolus: Patient was hospitalized for this.  She had preceding 2 to 3 weeks of dyspnea on exertion, diaphoresis, fatigue and then subsequently developed some hypotension with presyncopal symptoms.  Blood pressure was in the 60s over 40s and her daughter took her to the emergency department.  She was found to have a small pulmonary embolus on CT angiogram.  She did not have a DVT.  ED physician consulted vascular surgery and they recommended heparin.  She was admitted and then transition to Eliquis.  She notes no recent surgeries or travel.  Her Benicar was held.  Patient notes blood pressures at home have been 110 over 60s since got home.  She notes no chest pain.  Notes her fatigue and shortness of breath have been improving.  She was also noted to have AKI during her hospitalization.  Patient does report hair loss since discharge hospital.  Social History   Tobacco Use  Smoking Status Never  Smokeless Tobacco Never    Current Outpatient Medications on File Prior to Visit  Medication Sig Dispense Refill   apixaban (ELIQUIS) 5 MG TABS tablet Take 2 tablets (10 mg total) by mouth 2 (two) times daily for 7 days, THEN 1 tablet (5 mg total) 2 (two) times daily. 60 tablet 1   carvedilol (COREG) 6.25 MG tablet TAKE ONE TABLET BY MOUTH TWICE A DAY 180 tablet 1   dexamethasone 0.5 MG/5ML elixir Take 5 mLs (0.5 mg total) by mouth daily. Swish and spit 5 mL once in the morning and once at night. 100 mL 0   etanercept (ENBREL) 50 MG/ML injection Inject into the skin.     folic acid (FOLVITE) 1 MG tablet Take 1 mg by mouth daily.     methocarbamol (ROBAXIN) 500 MG tablet Take 500 mg by mouth 3 (three) times daily.     methotrexate (RHEUMATREX) 2.5 MG tablet Take 20 mg by mouth once a week.     ondansetron (ZOFRAN-ODT) 4 MG disintegrating tablet Take 1 tablet (4 mg total) by mouth every 8  (eight) hours as needed for nausea or vomiting. 15 tablet 0   sertraline (ZOLOFT) 50 MG tablet TAKE ONE TABLET BY MOUTH DAILY 90 tablet 0   sucralfate (CARAFATE) 1 g tablet Take 1 tablet (1 g total) by mouth 4 (four) times daily -  with meals and at bedtime. 30 tablet 1   pantoprazole (PROTONIX) 40 MG tablet Take 1 tablet (40 mg total) by mouth daily. 30 tablet 2   No current facility-administered medications on file prior to visit.     ROS see history of present illness  Objective  Physical Exam Vitals:   08/18/23 1140  BP: 110/68  Pulse: 64  Temp: 98 F (36.7 C)  SpO2: 99%    BP Readings from Last 3 Encounters:  08/18/23 110/68  08/13/23 111/74  08/08/23 108/64   Wt Readings from Last 3 Encounters:  08/18/23 197 lb 3.2 oz (89.4 kg)  08/12/23 191 lb 12.8 oz (87 kg)  08/08/23 185 lb (83.9 kg)    Physical Exam Constitutional:      General: She is not in acute distress.    Appearance: She is not diaphoretic.  Cardiovascular:     Rate and Rhythm: Normal rate and regular rhythm.     Heart sounds: Normal heart sounds.  Pulmonary:     Effort: Pulmonary effort is normal.  Breath sounds: Normal breath sounds.  Skin:    General: Skin is warm and dry.  Neurological:     Mental Status: She is alert.      Assessment/Plan: Please see individual problem list.  Acute pulmonary embolism without acute cor pulmonale, unspecified pulmonary embolism type [facility]) Assessment & Plan: Recurrent issue.  First PE was provoked though this is unprovoked.  Patient will continue her initial dosing of Eliquis and transition to 5 mg twice daily at the appropriate time as advised by the hospital.  Given that this is her second PE we will refer to hematology to consider further evaluation.  Discussed risk of bleeding with Eliquis and advised to seek medical attention for any bleeding more than 5 to 10 minutes or any excessive bleeding.  Orders: -     Ambulatory referral to Hematology /  Oncology  AKI (acute kidney injury) [facility]) Assessment & Plan: Possibly related to dehydration.  Recheck today.  Orders: -     Comprehensive metabolic panel  Elevated cholesterol Assessment & Plan: Patient reports her insurance checked her cholesterol recently and it was elevated.  She wonders what needs to be done for this.  We will recheck her cholesterol today.  Orders: -     Lipid panel  Primary hypertension Assessment & Plan: Chronic issue.  Blood pressure is adequately controlled.  Patient will continue carvedilol 6.25 mg twice daily.  She will remain off of Benicar.   Hair loss Assessment & Plan: Given the timing I suspect this is related to telogen effluvium.  I discussed this could improve with time.  She will monitor.   Rheumatoid arthritis involving both hands, unspecified whether rheumatoid factor present [facility]) Assessment & Plan: Chronic issue.  Patient is on Enbrel and methotrexate.  Patient request liver enzymes checked today.  This will be ordered.  She will continue to follow with rheumatology for medication management.      Return for as scheduled.   Marikay Alar, MD [facility]

## 2023-08-18 NOTE — Assessment & Plan Note (Signed)
Recurrent issue.  First PE was provoked though this is unprovoked.  Patient will continue her initial dosing of Eliquis and transition to 5 mg twice daily at the appropriate time as advised by the hospital.  Given that this is her second PE we will refer to hematology to consider further evaluation.  Discussed risk of bleeding with Eliquis and advised to seek medical attention for any bleeding more than 5 to 10 minutes or any excessive bleeding.

## 2023-08-18 NOTE — Assessment & Plan Note (Signed)
Chronic issue.  Blood pressure is adequately controlled.  Patient will continue carvedilol 6.25 mg twice daily.  She will remain off of Benicar.

## 2023-08-18 NOTE — Assessment & Plan Note (Addendum)
Chronic issue.  Patient is on Enbrel and methotrexate.  Patient request liver enzymes checked today.  This will be ordered.  She will continue to follow with rheumatology for medication management.

## 2023-08-18 NOTE — Assessment & Plan Note (Signed)
Possibly related to dehydration.  Recheck today.

## 2023-08-18 NOTE — Assessment & Plan Note (Signed)
Given the timing I suspect this is related to telogen effluvium.  I discussed this could improve with time.  She will monitor.

## 2023-08-19 ENCOUNTER — Telehealth: Payer: Self-pay | Admitting: Family Medicine

## 2023-08-19 ENCOUNTER — Emergency Department: Payer: Medicare Other

## 2023-08-19 ENCOUNTER — Telehealth: Payer: Self-pay

## 2023-08-19 ENCOUNTER — Emergency Department
Admission: EM | Admit: 2023-08-19 | Discharge: 2023-08-19 | Disposition: A | Discharge status: Home / Self Care | Payer: Medicare Other | Attending: Emergency Medicine | Admitting: Emergency Medicine

## 2023-08-19 ENCOUNTER — Other Ambulatory Visit: Payer: Self-pay

## 2023-08-19 DIAGNOSIS — I10 Essential (primary) hypertension: Secondary | ICD-10-CM | POA: Insufficient documentation

## 2023-08-19 DIAGNOSIS — Z1152 Encounter for screening for COVID-19: Secondary | ICD-10-CM | POA: Diagnosis not present

## 2023-08-19 DIAGNOSIS — D1809 Hemangioma of other sites: Secondary | ICD-10-CM | POA: Diagnosis not present

## 2023-08-19 DIAGNOSIS — R0602 Shortness of breath: Secondary | ICD-10-CM | POA: Diagnosis not present

## 2023-08-19 DIAGNOSIS — I2699 Other pulmonary embolism without acute cor pulmonale: Secondary | ICD-10-CM | POA: Diagnosis not present

## 2023-08-19 DIAGNOSIS — R0600 Dyspnea, unspecified: Secondary | ICD-10-CM | POA: Insufficient documentation

## 2023-08-19 DIAGNOSIS — K449 Diaphragmatic hernia without obstruction or gangrene: Secondary | ICD-10-CM | POA: Diagnosis not present

## 2023-08-19 LAB — CBC WITH DIFFERENTIAL/PLATELET
Abs Immature Granulocytes: 0.09 10*3/uL — ABNORMAL HIGH (ref 0.00–0.07)
Basophils Absolute: 0.1 10*3/uL (ref 0.0–0.1)
Basophils Relative: 1 %
Eosinophils Absolute: 0.1 10*3/uL (ref 0.0–0.5)
Eosinophils Relative: 2 %
HCT: 37 % (ref 36.0–46.0)
Hemoglobin: 12.5 g/dL (ref 12.0–15.0)
Immature Granulocytes: 1 %
Lymphocytes Relative: 38 %
Lymphs Abs: 2.6 10*3/uL (ref 0.7–4.0)
MCH: 30.3 pg (ref 26.0–34.0)
MCHC: 33.8 g/dL (ref 30.0–36.0)
MCV: 89.6 fL (ref 80.0–100.0)
Monocytes Absolute: 0.7 10*3/uL (ref 0.1–1.0)
Monocytes Relative: 10 %
Neutro Abs: 3.3 10*3/uL (ref 1.7–7.7)
Neutrophils Relative %: 48 %
Platelets: 318 10*3/uL (ref 150–400)
RBC: 4.13 MIL/uL (ref 3.87–5.11)
RDW: 13.8 % (ref 11.5–15.5)
WBC: 7 10*3/uL (ref 4.0–10.5)
nRBC: 0 % (ref 0.0–0.2)

## 2023-08-19 LAB — COMPREHENSIVE METABOLIC PANEL
ALT: 28 U/L (ref 0–44)
AST: 23 U/L (ref 15–41)
Albumin: 3.4 g/dL — ABNORMAL LOW (ref 3.5–5.0)
Alkaline Phosphatase: 66 U/L (ref 38–126)
Anion gap: 8 (ref 5–15)
BUN: 15 mg/dL (ref 8–23)
CO2: 24 mmol/L (ref 22–32)
Calcium: 9 mg/dL (ref 8.9–10.3)
Chloride: 108 mmol/L (ref 98–111)
Creatinine, Ser: 0.92 mg/dL (ref 0.44–1.00)
GFR, Estimated: >60 mL/min (ref >60)
Glucose, Bld: 108 mg/dL — ABNORMAL HIGH (ref 70–99)
Potassium: 4.1 mmol/L (ref 3.5–5.1)
Sodium: 140 mmol/L (ref 135–145)
Total Bilirubin: 0.3 mg/dL (ref 0.3–1.2)
Total Protein: 6.2 g/dL — ABNORMAL LOW (ref 6.5–8.1)

## 2023-08-19 LAB — SARS CORONAVIRUS 2 BY RT PCR: SARS Coronavirus 2 by RT PCR: NEGATIVE

## 2023-08-19 LAB — TROPONIN I (HIGH SENSITIVITY): Troponin I (High Sensitivity): 5 ng/L (ref <18)

## 2023-08-19 MED ORDER — IOHEXOL 350 MG/ML SOLN
75.0000 mL | Freq: Once | INTRAVENOUS | Status: AC | PRN
  Administered 2023-08-19: 75 mL via INTRAVENOUS

## 2023-08-19 NOTE — Telephone Encounter (Signed)
Agree if sob and given history, evaluation today.  It appears she is in ER.  Please confirm.

## 2023-08-19 NOTE — ED Provider Notes (Signed)
[facility] Provider Note    Event Date/Time   First MD Initiated Contact with Patient 08/19/23 1629     (approximate)  History   Chief Complaint: Shortness of Breath  HPI  Cindy Stein is a 67 y.o. female with a past medical history of anxiety, hypertension, hyperlipidemia, recently diagnosed PE who presents to the emergency department for worsening shortness of breath.  According to the patient and record review in 2021 patient had bilateral PE requiring thrombectomy was placed on anticoagulation for 1 year and then was taken off anticoagulation.  Last week patient again presented with shortness of breath diagnosed with new pulmonary emboli underwent thrombectomy and was discharged home on Xarelto.  She states initially she was doing well at home however today she has been more short of breath and is experiencing a tightness sensation across the chest.  Patient states she is very nervous that the blood clots could have shifted or new blood clot could have broken off and gone to the lungs.  Patient denies missing any doses of Xarelto.  Denies any pain in the chest but states a tightness sensation at times.  Physical Exam   Triage Vital Signs: ED Triage Vitals  Encounter Vitals Group     BP 08/19/23 1414 123/74     Systolic BP Percentile --      Diastolic BP Percentile --      Pulse Rate 08/19/23 1414 72     Resp 08/19/23 1414 20     Temp 08/19/23 1414 97.9 F (36.6 C)     Temp Source 08/19/23 1414 Oral     SpO2 08/19/23 1414 97 %     Weight 08/19/23 1415 193 lb (87.5 kg)     Height 08/19/23 1415 5\' 2"  (1.575 m)     Head Circumference --      Peak Flow --      Pain Score 08/19/23 1415 6     Pain Loc --      Pain Education --      Exclude from Growth Chart --     Most recent vital signs: Vitals:   08/19/23 1414  BP: 123/74  Pulse: 72  Resp: 20  Temp: 97.9 F (36.6 C)  SpO2: 97%    General: Awake, no distress.  CV:  Good peripheral  perfusion.  Regular rate and rhythm  Resp:  Normal effort.  Equal breath sounds bilaterally.  Clear bilaterally. Abd:  No distention.  Soft, nontender.  No rebound or guarding.  ED Results / Procedures / Treatments   EKG  EKG viewed and interpreted by myself shows a normal sinus rhythm at 73 bpm with a narrow QRS, normal axis, normal intervals, no concerning ST changes.  RADIOLOGY  I have reviewed and interpreted the chest x-ray images.  No consolidation on my evaluation. Radiology has read the x-ray is negative for acute disease.  MEDICATIONS ORDERED IN ED: Medications - No data to display   IMPRESSION / MDM / ASSESSMENT AND PLAN / ED COURSE  I reviewed the triage vital signs and the nursing notes.  Patient's presentation is most consistent with acute presentation with potential threat to life or bodily function.  Patient presents the emergency department for chest tightness/shortness of breath.  Patient was just admitted to the hospital for PE status post thrombectomy on Xarelto.  Patient feels as if the blood clot could have moved or there could be new blood clot.  Denies any pain but states that he  shortness of breath/tightness sensation to the chest.  Patient's lab work overall reassuring with a reassuring CBC and a reassuring chemistry including renal function.  Given the patient's history of her recent PE on Xarelto and worsening shortness of breath we will obtain CTA imaging of the chest to rule out pulmonary emboli.  Reassuringly patient satting in the upper 90s on room air.  As the patient was recently hospitalized we will also obtain a COVID swab as a precaution.  I have also added on a troponin.  CTA of the chest negative for any new PE, decreased clot burden.  COVID test is negative.  Patient very reassured by today's workup.  CBC is normal chemistry is normal troponin negative.  Given the patient's reassuring workup she is very relieved we will have her follow-up with her  doctor.  Patient agreeable to plan.  FINAL CLINICAL IMPRESSION(S) / ED DIAGNOSES   Dyspnea   Note:  This document was prepared using Dragon voice recognition software and may include unintentional dictation errors.   Minna Antis, MD 08/19/23 [phone]

## 2023-08-19 NOTE — Telephone Encounter (Signed)
Transition Care Management Unsuccessful Follow-up Telephone Call  Date of discharge and from where:  [place] 8/31  Attempts:  2nd Attempt  Reason for unsuccessful TCM follow-up call:  No answer/busy   Lenard Forth [place]  [facility], [facility] Guide, Phone: [phone] Website: [email or web address]

## 2023-08-19 NOTE — Telephone Encounter (Signed)
Pt daughter called stating the pt is having a hard time breathing and her oxygen is 96. Sent to access nurse

## 2023-08-19 NOTE — Telephone Encounter (Signed)
Patient confirmed she is in the ED being evaluated.

## 2023-08-19 NOTE — Discharge Instructions (Addendum)
Your workup today has shown overall normal results including a reassuring CT scan of your chest, lab work including cardiac labs and a negative COVID test.  Please follow-up with your doctor.  Return to the emergency department for any worsening shortness of breath any chest pain or any other symptom concerning to yourself.

## 2023-08-19 NOTE — ED Triage Notes (Signed)
Patient states shortness of breath today; reports history history of PE and has not missed any doses of Eliquis.

## 2023-08-21 ENCOUNTER — Encounter: Payer: Self-pay | Admitting: *Deleted

## 2023-08-21 ENCOUNTER — Telehealth: Payer: Self-pay

## 2023-08-21 NOTE — Telephone Encounter (Signed)
Tried to call new patient to introduce myself and her MD she would be seeing tomorrow, also making sure appointment time for tomorrow was still going to work. No one answered phone and couldn't leave voicemail for patient.

## 2023-08-22 ENCOUNTER — Encounter: Payer: Self-pay | Admitting: Internal Medicine

## 2023-08-22 ENCOUNTER — Inpatient Hospital Stay: Payer: Medicare Other

## 2023-08-22 ENCOUNTER — Inpatient Hospital Stay: Payer: Medicare Other | Attending: Internal Medicine | Admitting: Internal Medicine

## 2023-08-22 VITALS — BP 111/70 | HR 79 | Temp 98.1°F | Resp 17 | Wt 195.0 lb

## 2023-08-22 DIAGNOSIS — Z825 Family history of asthma and other chronic lower respiratory diseases: Secondary | ICD-10-CM | POA: Insufficient documentation

## 2023-08-22 DIAGNOSIS — Z86718 Personal history of other venous thrombosis and embolism: Secondary | ICD-10-CM | POA: Diagnosis not present

## 2023-08-22 DIAGNOSIS — I083 Combined rheumatic disorders of mitral, aortic and tricuspid valves: Secondary | ICD-10-CM | POA: Diagnosis not present

## 2023-08-22 DIAGNOSIS — Z832 Family history of diseases of the blood and blood-forming organs and certain disorders involving the immune mechanism: Secondary | ICD-10-CM | POA: Insufficient documentation

## 2023-08-22 DIAGNOSIS — M47814 Spondylosis without myelopathy or radiculopathy, thoracic region: Secondary | ICD-10-CM | POA: Diagnosis not present

## 2023-08-22 DIAGNOSIS — Z86711 Personal history of pulmonary embolism: Secondary | ICD-10-CM | POA: Insufficient documentation

## 2023-08-22 DIAGNOSIS — Z79899 Other long term (current) drug therapy: Secondary | ICD-10-CM | POA: Diagnosis not present

## 2023-08-22 DIAGNOSIS — E785 Hyperlipidemia, unspecified: Secondary | ICD-10-CM | POA: Diagnosis not present

## 2023-08-22 DIAGNOSIS — M069 Rheumatoid arthritis, unspecified: Secondary | ICD-10-CM | POA: Diagnosis not present

## 2023-08-22 DIAGNOSIS — I119 Hypertensive heart disease without heart failure: Secondary | ICD-10-CM | POA: Diagnosis not present

## 2023-08-22 DIAGNOSIS — Z90710 Acquired absence of both cervix and uterus: Secondary | ICD-10-CM | POA: Diagnosis not present

## 2023-08-22 DIAGNOSIS — K449 Diaphragmatic hernia without obstruction or gangrene: Secondary | ICD-10-CM | POA: Insufficient documentation

## 2023-08-22 DIAGNOSIS — F419 Anxiety disorder, unspecified: Secondary | ICD-10-CM | POA: Insufficient documentation

## 2023-08-22 DIAGNOSIS — Z8249 Family history of ischemic heart disease and other diseases of the circulatory system: Secondary | ICD-10-CM | POA: Insufficient documentation

## 2023-08-22 DIAGNOSIS — Z885 Allergy status to narcotic agent status: Secondary | ICD-10-CM | POA: Insufficient documentation

## 2023-08-22 DIAGNOSIS — Z7901 Long term (current) use of anticoagulants: Secondary | ICD-10-CM | POA: Insufficient documentation

## 2023-08-22 DIAGNOSIS — Z79631 Long term (current) use of antimetabolite agent: Secondary | ICD-10-CM | POA: Diagnosis not present

## 2023-08-22 DIAGNOSIS — I2693 Single subsegmental pulmonary embolism without acute cor pulmonale: Secondary | ICD-10-CM | POA: Diagnosis not present

## 2023-08-22 DIAGNOSIS — R0609 Other forms of dyspnea: Secondary | ICD-10-CM | POA: Diagnosis not present

## 2023-08-22 DIAGNOSIS — R61 Generalized hyperhidrosis: Secondary | ICD-10-CM | POA: Insufficient documentation

## 2023-08-22 DIAGNOSIS — Z7952 Long term (current) use of systemic steroids: Secondary | ICD-10-CM | POA: Diagnosis not present

## 2023-08-22 DIAGNOSIS — E669 Obesity, unspecified: Secondary | ICD-10-CM | POA: Diagnosis not present

## 2023-08-22 DIAGNOSIS — R918 Other nonspecific abnormal finding of lung field: Secondary | ICD-10-CM | POA: Diagnosis not present

## 2023-08-22 DIAGNOSIS — I2699 Other pulmonary embolism without acute cor pulmonale: Secondary | ICD-10-CM

## 2023-08-22 DIAGNOSIS — N281 Cyst of kidney, acquired: Secondary | ICD-10-CM | POA: Diagnosis not present

## 2023-08-22 NOTE — Assessment & Plan Note (Addendum)
#   08/12/2023- Pulmonary embolus seen in lower lobe branch of right pulmonary artery. Although RV/LV ratio is elevated at 1.25, this embolus appears to be too peripheral to result in right heart strain.  Patient currently on Eliquis.   # Dyspnea- /LEFT LE- [Dr.Arida]  # 2021 [s/p Knee surgery] > 1 year.   # RA- on Enbryl- on prendixsone 10 mg/day-   # cancer screening- colo due now [Dr.Anna]; mammogram- WNL.   Thank you D for allowing me to participate in the care of your pleasant patient. Please do not hesitate to contact me with questions or concerns in the interim.   # DISPOSITION: # no labs today # follow up as needed- Dr.B

## 2023-08-22 NOTE — Progress Notes (Signed)
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 New Eliquis causes headaches. NO other symptoms

## 2023-08-24 ENCOUNTER — Encounter: Payer: Self-pay | Admitting: Family Medicine

## 2023-08-24 DIAGNOSIS — K120 Recurrent oral aphthae: Secondary | ICD-10-CM | POA: Insufficient documentation

## 2023-08-24 NOTE — Assessment & Plan Note (Signed)
No improvement with antibiotics  Some improvement with dexamethasone.  Continue as previously ordered Continue Magic Mouthwash as previously prescribed Start Carafate 4 times daily If no improvement in 1 week follow up with PCP

## 2023-09-01 DIAGNOSIS — M9905 Segmental and somatic dysfunction of pelvic region: Secondary | ICD-10-CM | POA: Diagnosis not present

## 2023-09-01 DIAGNOSIS — M5417 Radiculopathy, lumbosacral region: Secondary | ICD-10-CM | POA: Diagnosis not present

## 2023-09-01 DIAGNOSIS — M5416 Radiculopathy, lumbar region: Secondary | ICD-10-CM | POA: Diagnosis not present

## 2023-09-01 DIAGNOSIS — M9903 Segmental and somatic dysfunction of lumbar region: Secondary | ICD-10-CM | POA: Diagnosis not present

## 2023-09-04 DIAGNOSIS — Z79899 Other long term (current) drug therapy: Secondary | ICD-10-CM | POA: Diagnosis not present

## 2023-09-04 DIAGNOSIS — M0579 Rheumatoid arthritis with rheumatoid factor of multiple sites without organ or systems involvement: Secondary | ICD-10-CM | POA: Diagnosis not present

## 2023-09-05 ENCOUNTER — Telehealth: Payer: Self-pay | Admitting: Family Medicine

## 2023-09-05 ENCOUNTER — Other Ambulatory Visit (INDEPENDENT_AMBULATORY_CARE_PROVIDER_SITE_OTHER): Payer: Medicare Other

## 2023-09-05 DIAGNOSIS — R770 Abnormality of albumin: Secondary | ICD-10-CM

## 2023-09-05 NOTE — Telephone Encounter (Signed)
Pt called stating she need a ADL test done because her insurance stated there was something wrong with her blood

## 2023-09-05 NOTE — Telephone Encounter (Signed)
Order placed. Please get her scheduled.

## 2023-09-05 NOTE — Addendum Note (Signed)
Addended by: Jarvis Morgan D on: 09/05/2023 03:06 PM   Modules accepted: Orders

## 2023-09-05 NOTE — Telephone Encounter (Signed)
Patient states her life Insurance states she needs another Albumin test done. Her albumin was a hair low at 3.4.

## 2023-09-05 NOTE — Telephone Encounter (Signed)
Left message for Patient to call and schedule a CMET lab the order is in.

## 2023-09-06 LAB — COMPREHENSIVE METABOLIC PANEL
AG Ratio: 1.4 (calc) (ref 1.0–2.5)
ALT: 21 U/L (ref 6–29)
AST: 16 U/L (ref 10–35)
Albumin: 3.6 g/dL (ref 3.6–5.1)
Alkaline phosphatase (APISO): 73 U/L (ref 37–153)
BUN: 14 mg/dL (ref 7–25)
CO2: 24 mmol/L (ref 20–32)
Calcium: 9.1 mg/dL (ref 8.6–10.4)
Chloride: 106 mmol/L (ref 98–110)
Creat: 0.87 mg/dL (ref 0.50–1.05)
Globulin: 2.6 g/dL (ref 1.9–3.7)
Glucose, Bld: 137 mg/dL — ABNORMAL HIGH (ref 65–99)
Potassium: 3.8 mmol/L (ref 3.5–5.3)
Sodium: 139 mmol/L (ref 135–146)
Total Bilirubin: 0.4 mg/dL (ref 0.2–1.2)
Total Protein: 6.2 g/dL (ref 6.1–8.1)

## 2023-09-08 DIAGNOSIS — M5416 Radiculopathy, lumbar region: Secondary | ICD-10-CM | POA: Diagnosis not present

## 2023-09-08 DIAGNOSIS — M9905 Segmental and somatic dysfunction of pelvic region: Secondary | ICD-10-CM | POA: Diagnosis not present

## 2023-09-08 DIAGNOSIS — M9903 Segmental and somatic dysfunction of lumbar region: Secondary | ICD-10-CM | POA: Diagnosis not present

## 2023-09-08 DIAGNOSIS — M5417 Radiculopathy, lumbosacral region: Secondary | ICD-10-CM | POA: Diagnosis not present

## 2023-09-10 ENCOUNTER — Telehealth: Payer: Self-pay | Admitting: *Deleted

## 2023-09-10 ENCOUNTER — Encounter: Payer: Self-pay | Admitting: *Deleted

## 2023-09-10 NOTE — Telephone Encounter (Signed)
Left voicemail to return call or check mychart

## 2023-09-10 NOTE — Telephone Encounter (Signed)
-----   Message from Marikay Alar sent at 09/09/2023  8:14 PM EDT ----- Please let the patient know her albumin level is in the acceptable range.

## 2023-09-15 ENCOUNTER — Telehealth: Payer: Self-pay | Admitting: Family Medicine

## 2023-09-15 DIAGNOSIS — M9905 Segmental and somatic dysfunction of pelvic region: Secondary | ICD-10-CM | POA: Diagnosis not present

## 2023-09-15 DIAGNOSIS — M5417 Radiculopathy, lumbosacral region: Secondary | ICD-10-CM | POA: Diagnosis not present

## 2023-09-15 DIAGNOSIS — M5416 Radiculopathy, lumbar region: Secondary | ICD-10-CM | POA: Diagnosis not present

## 2023-09-15 DIAGNOSIS — M9903 Segmental and somatic dysfunction of lumbar region: Secondary | ICD-10-CM | POA: Diagnosis not present

## 2023-09-15 NOTE — Telephone Encounter (Signed)
Patient called and states was in hospital 3 weeks ago where they took her off BP medication but says her BP is in the 140 when usually in 120s she would like advice. Please call

## 2023-09-16 NOTE — Telephone Encounter (Signed)
Can we get her in to the office to check her BP? It looks like it has been ok when checked at other doctor's offices since my last visit with her.

## 2023-09-17 NOTE — Telephone Encounter (Signed)
Left message to call the office back regarding the message below.

## 2023-09-17 NOTE — Telephone Encounter (Signed)
She can keep her appointment with me next week, though she needs to contact us prior to then if her BP goes above 150 systolic or above 100 diastolic.

## 2023-09-18 NOTE — Telephone Encounter (Signed)
Left message to call the office back regarding the message below.

## 2023-09-19 ENCOUNTER — Telehealth: Payer: Self-pay

## 2023-09-19 NOTE — Telephone Encounter (Signed)
Transition Care Management Unsuccessful Follow-up Telephone Call  Date of discharge and from where:  [place] 9/17  Attempts:  1st Attempt  Reason for unsuccessful TCM follow-up call:  No answer/busy   Lenard Forth [place]  [facility], [facility] Guide, Phone: [phone] Website: [email or web address]

## 2023-09-22 ENCOUNTER — Encounter: Payer: Self-pay | Admitting: Family Medicine

## 2023-09-22 ENCOUNTER — Telehealth: Payer: Self-pay

## 2023-09-22 ENCOUNTER — Ambulatory Visit (INDEPENDENT_AMBULATORY_CARE_PROVIDER_SITE_OTHER): Payer: Medicare Other | Admitting: Family Medicine

## 2023-09-22 VITALS — BP 142/82 | HR 57 | Temp 98.1°F | Ht 62.0 in | Wt 198.8 lb

## 2023-09-22 DIAGNOSIS — I10 Essential (primary) hypertension: Secondary | ICD-10-CM | POA: Diagnosis not present

## 2023-09-22 DIAGNOSIS — M5416 Radiculopathy, lumbar region: Secondary | ICD-10-CM | POA: Diagnosis not present

## 2023-09-22 DIAGNOSIS — Z23 Encounter for immunization: Secondary | ICD-10-CM | POA: Diagnosis not present

## 2023-09-22 DIAGNOSIS — M9903 Segmental and somatic dysfunction of lumbar region: Secondary | ICD-10-CM | POA: Diagnosis not present

## 2023-09-22 DIAGNOSIS — I2699 Other pulmonary embolism without acute cor pulmonale: Secondary | ICD-10-CM

## 2023-09-22 DIAGNOSIS — L659 Nonscarring hair loss, unspecified: Secondary | ICD-10-CM

## 2023-09-22 DIAGNOSIS — M5417 Radiculopathy, lumbosacral region: Secondary | ICD-10-CM | POA: Diagnosis not present

## 2023-09-22 DIAGNOSIS — M9905 Segmental and somatic dysfunction of pelvic region: Secondary | ICD-10-CM | POA: Diagnosis not present

## 2023-09-22 MED ORDER — APIXABAN 5 MG PO TABS
5.0000 mg | ORAL_TABLET | Freq: Two times a day (BID) | ORAL | 1 refills | Status: DC
Start: 2023-09-22 — End: 2024-03-22

## 2023-09-22 MED ORDER — OLMESARTAN MEDOXOMIL 20 MG PO TABS
20.0000 mg | ORAL_TABLET | Freq: Every day | ORAL | 1 refills | Status: DC
Start: 2023-09-22 — End: 2024-03-15

## 2023-09-22 NOTE — Assessment & Plan Note (Signed)
Chronic issue.  Above goal.  Patient will continue carvedilol 6.25 mg twice daily.  We will add back Benicar 20 mg daily.  She will return to see me in about 10 days for a visit and labs.

## 2023-09-22 NOTE — Progress Notes (Signed)
Marikay Alar, MD Phone: [phone]  Cindy Stein is a 67 y.o. female who presents today for F/u.  HYPERTENSION Disease Monitoring Home BP Monitoring 140s systolic Chest pain- no    Dyspnea- no Medications Compliance-  taking coreg.  Edema- no BMET    Component Value Date/Time   NA 139 09/05/2023 1506   NA 141 06/18/2022 0941   K 3.8 09/05/2023 1506   CL 106 09/05/2023 1506   CO2 24 09/05/2023 1506   GLUCOSE 137 (H) 09/05/2023 1506   BUN 14 09/05/2023 1506   BUN 19 06/18/2022 0941   CREATININE 0.87 09/05/2023 1506   CALCIUM 9.1 09/05/2023 1506   GFRNONAA >60 08/19/2023 1419   GFRAA 38 (L) 04/26/2020 1646   Hair loss: Patient notes this has been going on for 4 months.  She is not sure what precipitated it.  She has always had thin hair.  She wonders if one of her medications is contributing to this.  She is on methotrexate and Enbrel for her rheumatoid arthritis.  History of PE: Patient notes she needs a new prescription for Eliquis 5 mg twice daily.  Social History   Tobacco Use  Smoking Status Never  Smokeless Tobacco Never    Current Outpatient Medications on File Prior to Visit  Medication Sig Dispense Refill   carvedilol (COREG) 6.25 MG tablet TAKE ONE TABLET BY MOUTH TWICE A DAY 180 tablet 1   dexamethasone 0.5 MG/5ML elixir Take 5 mLs (0.5 mg total) by mouth daily. Swish and spit 5 mL once in the morning and once at night. 100 mL 0   etanercept (ENBREL) 50 MG/ML injection Inject into the skin.     folic acid (FOLVITE) 1 MG tablet Take 1 mg by mouth daily.     methocarbamol (ROBAXIN) 500 MG tablet Take 500 mg by mouth 3 (three) times daily.     methotrexate (RHEUMATREX) 2.5 MG tablet Take 20 mg by mouth once a week.     naproxen sodium (ALEVE) 220 MG tablet Take 220 mg by mouth.     ondansetron (ZOFRAN-ODT) 4 MG disintegrating tablet Take 1 tablet (4 mg total) by mouth every 8 (eight) hours as needed for nausea or vomiting. 15 tablet 0   predniSONE  (DELTASONE) 10 MG tablet      sertraline (ZOLOFT) 50 MG tablet TAKE ONE TABLET BY MOUTH DAILY 90 tablet 0   sucralfate (CARAFATE) 1 g tablet TAKE ONE TABLET (1 GRAM TOTAL) BY MOUTH FOUR (FOUR) TIMES DAILY - WITH MEALS AND AT BEDTIME. 30 tablet 1   pantoprazole (PROTONIX) 40 MG tablet Take 1 tablet (40 mg total) by mouth daily. (Patient not taking: Reported on 08/22/2023) 30 tablet 2   No current facility-administered medications on file prior to visit.     ROS see history of present illness  Objective  Physical Exam Vitals:   09/22/23 0903  BP: (!) 142/82  Pulse: (!) 57  Temp: 98.1 F (36.7 C)  SpO2: 99%    BP Readings from Last 3 Encounters:  09/22/23 (!) 142/82  08/22/23 111/70  08/19/23 (!) 153/77   Wt Readings from Last 3 Encounters:  09/22/23 198 lb 12.8 oz (90.2 kg)  08/22/23 195 lb (88.5 kg)  08/19/23 193 lb (87.5 kg)    Physical Exam Constitutional:      General: She is not in acute distress.    Appearance: She is not diaphoretic.  Cardiovascular:     Rate and Rhythm: Normal rate and regular rhythm.  Heart sounds: Normal heart sounds.  Pulmonary:     Effort: Pulmonary effort is normal.     Breath sounds: Normal breath sounds.  Skin:    General: Skin is warm and dry.     Comments: Hair is quite thin with no signs of scalp inflammation  Neurological:     Mental Status: She is alert.      Assessment/Plan: Please see individual problem list.  Primary hypertension Assessment & Plan: Chronic issue.  Above goal.  Patient will continue carvedilol 6.25 mg twice daily.  We will add back Benicar 20 mg daily.  She will return to see me in about 10 days for a visit and labs.  Orders: -     Olmesartan Medoxomil; Take 1 tablet (20 mg total) by mouth daily.  Dispense: 90 tablet; Refill: 1  Acute pulmonary embolism without acute cor pulmonale, unspecified pulmonary embolism type [facility]) Assessment & Plan: Chronic issue.  Patient will continue Eliquis 5 mg twice  daily.  Sent to pharmacy.  Orders: -     Apixaban; Take 1 tablet (5 mg total) by mouth 2 (two) times daily.  Dispense: 180 tablet; Refill: 1  Hair loss Assessment & Plan: Potentially could be telogen effluvium though could also be related to methotrexate.  Will plan on checking a TSH with her next set of labs.   Encounter for administration of vaccine -     Flu Vaccine Trivalent High Dose (Fluad)     Return in about 10 days (around 10/02/2023) for Hypertension with labs (BMP/TSH) appointment with PCP.   Marikay Alar, MD [facility] Primary Care [facility]

## 2023-09-22 NOTE — Assessment & Plan Note (Signed)
Potentially could be telogen effluvium though could also be related to methotrexate.  Will plan on checking a TSH with her next set of labs.

## 2023-09-22 NOTE — Telephone Encounter (Signed)
Patient had a visit on 09/22/23 .

## 2023-09-22 NOTE — Patient Instructions (Signed)
Nice to see you. We are going to restart olmesartan 20 mg daily.  I will see you back in about 10 days.

## 2023-09-22 NOTE — Assessment & Plan Note (Signed)
Chronic issue.  Patient will continue Eliquis 5 mg twice daily.  Sent to pharmacy.

## 2023-09-22 NOTE — Telephone Encounter (Signed)
Transition Care Management Unsuccessful Follow-up Telephone Call  Date of discharge and from where:  [facility] 9/17  Attempts:  2nd Attempt  Reason for unsuccessful TCM follow-up call:  No answer/busy   Lenard Forth [place]  [facility], [facility] Guide, Phone: [phone] Website: [email or web address]

## 2023-09-29 DIAGNOSIS — M9905 Segmental and somatic dysfunction of pelvic region: Secondary | ICD-10-CM | POA: Diagnosis not present

## 2023-09-29 DIAGNOSIS — M5417 Radiculopathy, lumbosacral region: Secondary | ICD-10-CM | POA: Diagnosis not present

## 2023-09-29 DIAGNOSIS — M5416 Radiculopathy, lumbar region: Secondary | ICD-10-CM | POA: Diagnosis not present

## 2023-09-29 DIAGNOSIS — M9903 Segmental and somatic dysfunction of lumbar region: Secondary | ICD-10-CM | POA: Diagnosis not present

## 2023-10-03 ENCOUNTER — Encounter: Payer: Self-pay | Admitting: Family Medicine

## 2023-10-03 ENCOUNTER — Ambulatory Visit (INDEPENDENT_AMBULATORY_CARE_PROVIDER_SITE_OTHER): Payer: Medicare Other | Admitting: Family Medicine

## 2023-10-03 VITALS — BP 110/72 | HR 63 | Temp 98.0°F | Ht 62.0 in | Wt 198.4 lb

## 2023-10-03 DIAGNOSIS — I10 Essential (primary) hypertension: Secondary | ICD-10-CM | POA: Diagnosis not present

## 2023-10-03 DIAGNOSIS — M069 Rheumatoid arthritis, unspecified: Secondary | ICD-10-CM

## 2023-10-03 DIAGNOSIS — I2699 Other pulmonary embolism without acute cor pulmonale: Secondary | ICD-10-CM | POA: Diagnosis not present

## 2023-10-03 LAB — BASIC METABOLIC PANEL
BUN: 20 mg/dL (ref 6–23)
CO2: 27 meq/L (ref 19–32)
Calcium: 9.1 mg/dL (ref 8.4–10.5)
Chloride: 105 meq/L (ref 96–112)
Creatinine, Ser: 0.77 mg/dL (ref 0.40–1.20)
GFR: 78.57 mL/min (ref >60.00)
Glucose, Bld: 97 mg/dL (ref 70–99)
Potassium: 4.2 meq/L (ref 3.5–5.1)
Sodium: 138 meq/L (ref 135–145)

## 2023-10-03 MED ORDER — ONDANSETRON 4 MG PO TBDP
4.0000 mg | ORAL_TABLET | Freq: Three times a day (TID) | ORAL | 0 refills | Status: DC | PRN
Start: 2023-10-03 — End: 2023-10-07

## 2023-10-03 NOTE — Assessment & Plan Note (Signed)
Chronic issue.  Well-controlled.  She will continue carvedilol 6.25 mg twice daily and Benicar 20 mg daily.  Check BMP.

## 2023-10-03 NOTE — Assessment & Plan Note (Signed)
Chronic issue.  Patient has intermittent shortness of breath after having her most recent pulmonary embolus.  Discussed it may take some time for her to get back to her baseline.  Discussed the risk of her having recurrent PE if she does not miss any doses of Eliquis is fairly low.  If she has any persistent shortness of breath or develops chest pain she will seek medical attention immediately.  She will continue Eliquis 5 mg twice daily.

## 2023-10-03 NOTE — Assessment & Plan Note (Signed)
Chronic issue.  Discussed that Enbrel does not appear to have PE as a possible side effect.  Discussed methotrexate does have this listed as postmarketing side effect.  Encouraged her to discuss this with her rheumatologist.  Will prescribe Zofran to use as needed for her intermittent nausea related to medications for her RA.

## 2023-10-03 NOTE — Patient Instructions (Signed)
Nice to see you. Please discuss whether or not methotrexate could be contributing to your PE with your rheumatologist.

## 2023-10-03 NOTE — Progress Notes (Signed)
Marikay Alar, MD Phone: [phone]  Cindy Stein is a 67 y.o. female who presents today for f/u.  HYPERTENSION Disease Monitoring Home BP Monitoring 108/72 this morning Chest pain- no    Dyspnea- occasional episodes after her recent PE Medications Compliance-  taking coreg, benicar. Lightheadedness-  no  Edema- no BMET    Component Value Date/Time   NA 139 09/05/2023 1506   NA 141 06/18/2022 0941   K 3.8 09/05/2023 1506   CL 106 09/05/2023 1506   CO2 24 09/05/2023 1506   GLUCOSE 137 (H) 09/05/2023 1506   BUN 14 09/05/2023 1506   BUN 19 06/18/2022 0941   CREATININE 0.87 09/05/2023 1506   CALCIUM 9.1 09/05/2023 1506   GFRNONAA >60 08/19/2023 1419   GFRAA 38 (L) 04/26/2020 1646   History of PE: Patient is a little over a month and a half out from pulmonary embolism.  Occasionally has some dyspnea episodes that do resolve fairly quickly.  Occurs when she is walking.  She is taking her Eliquis.  Rheumatoid arthritis: Patient reports some nausea from her rheumatoid arthritis medications.  She notes no vomiting, diarrhea, or abdominal pain.  She has taken Zofran in the past for this.  She notes when her rheumatologist put her on prednisone for a pinched nerve she had no joint pain.  She wonders if the Enbrel could be contributing to the PE that she had.  Social History   Tobacco Use  Smoking Status Never  Smokeless Tobacco Never    Current Outpatient Medications on File Prior to Visit  Medication Sig Dispense Refill   apixaban (ELIQUIS) 5 MG TABS tablet Take 1 tablet (5 mg total) by mouth 2 (two) times daily. 180 tablet 1   carvedilol (COREG) 6.25 MG tablet TAKE ONE TABLET BY MOUTH TWICE A DAY 180 tablet 1   dexamethasone 0.5 MG/5ML elixir Take 5 mLs (0.5 mg total) by mouth daily. Swish and spit 5 mL once in the morning and once at night. 100 mL 0   etanercept (ENBREL) 50 MG/ML injection Inject into the skin.     folic acid (FOLVITE) 1 MG tablet Take 1 mg by mouth  daily.     methocarbamol (ROBAXIN) 500 MG tablet Take 500 mg by mouth 3 (three) times daily.     methotrexate (RHEUMATREX) 2.5 MG tablet Take 20 mg by mouth once a week.     naproxen sodium (ALEVE) 220 MG tablet Take 220 mg by mouth.     olmesartan (BENICAR) 20 MG tablet Take 1 tablet (20 mg total) by mouth daily. 90 tablet 1   predniSONE (DELTASONE) 10 MG tablet      sertraline (ZOLOFT) 50 MG tablet TAKE ONE TABLET BY MOUTH DAILY 90 tablet 0   sucralfate (CARAFATE) 1 g tablet TAKE ONE TABLET (1 GRAM TOTAL) BY MOUTH FOUR (FOUR) TIMES DAILY - WITH MEALS AND AT BEDTIME. 30 tablet 1   pantoprazole (PROTONIX) 40 MG tablet Take 1 tablet (40 mg total) by mouth daily. (Patient not taking: Reported on 08/22/2023) 30 tablet 2   No current facility-administered medications on file prior to visit.     ROS see history of present illness  Objective  Physical Exam Vitals:   10/03/23 0938  BP: 110/72  Pulse: 63  Temp: 98 F (36.7 C)  SpO2: 97%    BP Readings from Last 3 Encounters:  10/03/23 110/72  09/22/23 (!) 142/82  08/22/23 111/70   Wt Readings from Last 3 Encounters:  10/03/23 198 lb 6.4  oz (90 kg)  09/22/23 198 lb 12.8 oz (90.2 kg)  08/22/23 195 lb (88.5 kg)    Physical Exam Constitutional:      General: She is not in acute distress.    Appearance: She is not diaphoretic.  Cardiovascular:     Rate and Rhythm: Normal rate and regular rhythm.     Heart sounds: Normal heart sounds.  Pulmonary:     Effort: Pulmonary effort is normal.  Musculoskeletal:     Right lower leg: No edema.     Left lower leg: No edema.  Skin:    General: Skin is warm and dry.  Neurological:     Mental Status: She is alert.      Assessment/Plan: Please see individual problem list.  Primary hypertension Assessment & Plan: Chronic issue.  Well-controlled.  She will continue carvedilol 6.25 mg twice daily and Benicar 20 mg daily.  Check BMP.  Orders: -     Basic metabolic panel  Acute  pulmonary embolism without acute cor pulmonale, unspecified pulmonary embolism type (HCC) Assessment & Plan: Chronic issue.  Patient has intermittent shortness of breath after having her most recent pulmonary embolus.  Discussed it may take some time for her to get back to her baseline.  Discussed the risk of her having recurrent PE if she does not miss any doses of Eliquis is fairly low.  If she has any persistent shortness of breath or develops chest pain she will seek medical attention immediately.  She will continue Eliquis 5 mg twice daily.   Rheumatoid arthritis involving both hands, unspecified whether rheumatoid factor present [facility]) Assessment & Plan: Chronic issue.  Discussed that Enbrel does not appear to have PE as a possible side effect.  Discussed methotrexate does have this listed as postmarketing side effect.  Encouraged her to discuss this with her rheumatologist.  Will prescribe Zofran to use as needed for her intermittent nausea related to medications for her RA.  Orders: -     Ondansetron; Take 1 tablet (4 mg total) by mouth every 8 (eight) hours as needed for nausea or vomiting.  Dispense: 15 tablet; Refill: 0    Return in about 6 months (around 04/01/2024) for Transfer of care.   Marikay Alar, MD [facility] Primary Care [facility]

## 2023-10-04 ENCOUNTER — Other Ambulatory Visit: Payer: Self-pay | Admitting: Family Medicine

## 2023-10-04 DIAGNOSIS — M069 Rheumatoid arthritis, unspecified: Secondary | ICD-10-CM

## 2023-10-06 DIAGNOSIS — M5417 Radiculopathy, lumbosacral region: Secondary | ICD-10-CM | POA: Diagnosis not present

## 2023-10-06 DIAGNOSIS — M9905 Segmental and somatic dysfunction of pelvic region: Secondary | ICD-10-CM | POA: Diagnosis not present

## 2023-10-06 DIAGNOSIS — M9903 Segmental and somatic dysfunction of lumbar region: Secondary | ICD-10-CM | POA: Diagnosis not present

## 2023-10-06 DIAGNOSIS — M5416 Radiculopathy, lumbar region: Secondary | ICD-10-CM | POA: Diagnosis not present

## 2023-10-08 ENCOUNTER — Other Ambulatory Visit: Payer: Self-pay | Admitting: Family Medicine

## 2023-10-08 DIAGNOSIS — M069 Rheumatoid arthritis, unspecified: Secondary | ICD-10-CM

## 2023-10-20 DIAGNOSIS — M5417 Radiculopathy, lumbosacral region: Secondary | ICD-10-CM | POA: Diagnosis not present

## 2023-10-20 DIAGNOSIS — M5416 Radiculopathy, lumbar region: Secondary | ICD-10-CM | POA: Diagnosis not present

## 2023-10-20 DIAGNOSIS — M9903 Segmental and somatic dysfunction of lumbar region: Secondary | ICD-10-CM | POA: Diagnosis not present

## 2023-10-20 DIAGNOSIS — M9905 Segmental and somatic dysfunction of pelvic region: Secondary | ICD-10-CM | POA: Diagnosis not present

## 2023-10-21 ENCOUNTER — Other Ambulatory Visit: Payer: Self-pay | Admitting: Family Medicine

## 2023-10-21 DIAGNOSIS — I10 Essential (primary) hypertension: Secondary | ICD-10-CM

## 2023-11-03 DIAGNOSIS — M5416 Radiculopathy, lumbar region: Secondary | ICD-10-CM | POA: Diagnosis not present

## 2023-11-03 DIAGNOSIS — M9903 Segmental and somatic dysfunction of lumbar region: Secondary | ICD-10-CM | POA: Diagnosis not present

## 2023-11-03 DIAGNOSIS — M5417 Radiculopathy, lumbosacral region: Secondary | ICD-10-CM | POA: Diagnosis not present

## 2023-11-03 DIAGNOSIS — M9905 Segmental and somatic dysfunction of pelvic region: Secondary | ICD-10-CM | POA: Diagnosis not present

## 2023-11-06 DIAGNOSIS — M0579 Rheumatoid arthritis with rheumatoid factor of multiple sites without organ or systems involvement: Secondary | ICD-10-CM | POA: Diagnosis not present

## 2023-11-06 DIAGNOSIS — M5416 Radiculopathy, lumbar region: Secondary | ICD-10-CM | POA: Diagnosis not present

## 2023-11-06 DIAGNOSIS — Z79899 Other long term (current) drug therapy: Secondary | ICD-10-CM | POA: Diagnosis not present

## 2023-11-07 ENCOUNTER — Telehealth: Payer: Self-pay | Admitting: Family Medicine

## 2023-11-07 ENCOUNTER — Other Ambulatory Visit: Payer: Self-pay | Admitting: Family Medicine

## 2023-11-07 DIAGNOSIS — R928 Other abnormal and inconclusive findings on diagnostic imaging of breast: Secondary | ICD-10-CM

## 2023-11-07 DIAGNOSIS — F32A Depression, unspecified: Secondary | ICD-10-CM

## 2023-11-07 NOTE — Telephone Encounter (Signed)
Patients states need an order to get a complete mammogram performed [facility]. Please contact patient for more information

## 2023-11-07 NOTE — Telephone Encounter (Signed)
Orders placed.  Please make the patient aware that the breast center should be reaching out to her to get this scheduled.  Please confirm with the patient that this is just a follow-up on her mammogram from earlier this year.

## 2023-11-10 DIAGNOSIS — M9903 Segmental and somatic dysfunction of lumbar region: Secondary | ICD-10-CM | POA: Diagnosis not present

## 2023-11-10 DIAGNOSIS — M5416 Radiculopathy, lumbar region: Secondary | ICD-10-CM | POA: Diagnosis not present

## 2023-11-10 DIAGNOSIS — M9905 Segmental and somatic dysfunction of pelvic region: Secondary | ICD-10-CM | POA: Diagnosis not present

## 2023-11-10 DIAGNOSIS — M5417 Radiculopathy, lumbosacral region: Secondary | ICD-10-CM | POA: Diagnosis not present

## 2023-11-10 NOTE — Telephone Encounter (Signed)
Pt did not answer, left detailed message and asked her to call office back with any questions.

## 2023-11-19 ENCOUNTER — Telehealth: Payer: Self-pay | Admitting: Family Medicine

## 2023-11-19 DIAGNOSIS — M9903 Segmental and somatic dysfunction of lumbar region: Secondary | ICD-10-CM | POA: Diagnosis not present

## 2023-11-19 DIAGNOSIS — M9905 Segmental and somatic dysfunction of pelvic region: Secondary | ICD-10-CM | POA: Diagnosis not present

## 2023-11-19 DIAGNOSIS — M5417 Radiculopathy, lumbosacral region: Secondary | ICD-10-CM | POA: Diagnosis not present

## 2023-11-19 DIAGNOSIS — M5416 Radiculopathy, lumbar region: Secondary | ICD-10-CM | POA: Diagnosis not present

## 2023-11-19 NOTE — Telephone Encounter (Signed)
Patient was in the office with her husband today and asked for an antibiotic to take prophylactically given that she has relatives coming into town for the holidays.  She noted no symptoms.  Discussed that we do not prescribe prophylactic antibiotics to prevent respiratory illnesses given that most of them will be viruses.  Discussed good hygiene to limit getting an illness.  Discussed the risk for drug resistance in bacteria if we over prescribe antibiotics.

## 2023-11-24 DIAGNOSIS — M5417 Radiculopathy, lumbosacral region: Secondary | ICD-10-CM | POA: Diagnosis not present

## 2023-11-24 DIAGNOSIS — M5416 Radiculopathy, lumbar region: Secondary | ICD-10-CM | POA: Diagnosis not present

## 2023-11-24 DIAGNOSIS — M9903 Segmental and somatic dysfunction of lumbar region: Secondary | ICD-10-CM | POA: Diagnosis not present

## 2023-11-24 DIAGNOSIS — M9905 Segmental and somatic dysfunction of pelvic region: Secondary | ICD-10-CM | POA: Diagnosis not present

## 2023-11-28 DIAGNOSIS — M9905 Segmental and somatic dysfunction of pelvic region: Secondary | ICD-10-CM | POA: Diagnosis not present

## 2023-11-28 DIAGNOSIS — M5416 Radiculopathy, lumbar region: Secondary | ICD-10-CM | POA: Diagnosis not present

## 2023-11-28 DIAGNOSIS — M5417 Radiculopathy, lumbosacral region: Secondary | ICD-10-CM | POA: Diagnosis not present

## 2023-11-28 DIAGNOSIS — M9903 Segmental and somatic dysfunction of lumbar region: Secondary | ICD-10-CM | POA: Diagnosis not present

## 2023-12-01 DIAGNOSIS — M5416 Radiculopathy, lumbar region: Secondary | ICD-10-CM | POA: Diagnosis not present

## 2023-12-01 DIAGNOSIS — M5417 Radiculopathy, lumbosacral region: Secondary | ICD-10-CM | POA: Diagnosis not present

## 2023-12-01 DIAGNOSIS — M9905 Segmental and somatic dysfunction of pelvic region: Secondary | ICD-10-CM | POA: Diagnosis not present

## 2023-12-01 DIAGNOSIS — M9903 Segmental and somatic dysfunction of lumbar region: Secondary | ICD-10-CM | POA: Diagnosis not present

## 2023-12-12 ENCOUNTER — Ambulatory Visit
Admission: RE | Admit: 2023-12-12 | Discharge: 2023-12-12 | Disposition: A | Discharge status: Home / Self Care | Payer: Medicare Other | Source: Ambulatory Visit | Attending: Family Medicine | Admitting: Family Medicine

## 2023-12-12 DIAGNOSIS — R92323 Mammographic fibroglandular density, bilateral breasts: Secondary | ICD-10-CM | POA: Diagnosis not present

## 2023-12-12 DIAGNOSIS — R928 Other abnormal and inconclusive findings on diagnostic imaging of breast: Secondary | ICD-10-CM | POA: Diagnosis not present

## 2024-01-01 ENCOUNTER — Ambulatory Visit: Payer: Medicare Other | Admitting: Dermatology

## 2024-01-05 ENCOUNTER — Ambulatory Visit: Payer: Self-pay | Admitting: Family Medicine

## 2024-01-05 DIAGNOSIS — M0579 Rheumatoid arthritis with rheumatoid factor of multiple sites without organ or systems involvement: Secondary | ICD-10-CM | POA: Diagnosis not present

## 2024-01-05 DIAGNOSIS — Z79899 Other long term (current) drug therapy: Secondary | ICD-10-CM | POA: Diagnosis not present

## 2024-01-05 NOTE — Telephone Encounter (Signed)
  Chief Complaint: not feeling like self, low BP Symptoms: does not feel like doing anything Frequency: 2 weeks Pertinent Negatives: Patient denies illness,  Disposition: [] ED /[] Urgent Care (no appt availability in office) / [x] Appointment(In office/virtual)/ []  [facility]/ [] Home Care/ [] Refused Recommended Disposition /[] [place] [facility]/ []  Follow-up with PCP  Additional Notes: Pt states that she was feeling "different" this morning. Pt took BP at that time and auto cuff gave reading of 80/62 with HR 43. Pt retook BP while speaking to triage nurse and was 115/93 with HR 86. Pt states that her and her husband have been sick with the flu recently. Pt sched for tomorrow with PCP. Advised to recheck BP tonight and tomorrow morning and keep journal of readings for PCP visit. Pt advised to call back if s/s worsen or if she has low BP reading again.  Pt denies current, previous dizziness/lightheadedness.   Copied from CRM [id number]. Topic: Clinical - Red Word Triage >> Jan 05, 2024 11:34 AM Leavy Cella D wrote: Red Word that prompted transfer to Nurse Triage: patient state that her blood pressure has dropped several low Reason for Disposition  [1] Systolic BP 90-110 AND [2] taking blood pressure medications AND [3] NOT dizzy, lightheaded or weak  Answer Assessment - Initial Assessment Questions 1. BLOOD PRESSURE: "What is the blood pressure?" "Did you take at least two measurements 5 minutes apart?"     80/62 43, 115/93 HR 86 2. ONSET: "When did you take your blood pressure?"     This morning  3. HOW: "How did you obtain the blood pressure?" (e.g., visiting nurse, automatic home BP monitor)     auto 4. HISTORY: "Do you have a history of low blood pressure?" "What is your blood pressure normally?"     No, normally my BP is 128/80 5. MEDICINES: "Are you taking any medications for blood pressure?" If Yes, ask: "Have they been changed recently?"     Carvedilol, benicar 7. OTHER  SYMPTOMS: "Have you been sick recently?" "Have you had a recent injury?"     We have had the flu 2 weeks ago  Protocols used: Blood Pressure - Low-A-AH

## 2024-01-05 NOTE — Telephone Encounter (Signed)
 Noted

## 2024-01-06 ENCOUNTER — Ambulatory Visit (INDEPENDENT_AMBULATORY_CARE_PROVIDER_SITE_OTHER): Payer: Medicare Other | Admitting: Family Medicine

## 2024-01-06 ENCOUNTER — Encounter: Payer: Self-pay | Admitting: Family Medicine

## 2024-01-06 ENCOUNTER — Ambulatory Visit (INDEPENDENT_AMBULATORY_CARE_PROVIDER_SITE_OTHER): Payer: Medicare Other

## 2024-01-06 VITALS — BP 130/84 | HR 74 | Temp 97.9°F | Ht 62.0 in | Wt 208.0 lb

## 2024-01-06 DIAGNOSIS — R079 Chest pain, unspecified: Secondary | ICD-10-CM

## 2024-01-06 DIAGNOSIS — R0602 Shortness of breath: Secondary | ICD-10-CM

## 2024-01-06 LAB — COMPREHENSIVE METABOLIC PANEL
ALT: 24 U/L (ref 0–35)
AST: 19 U/L (ref 0–37)
Albumin: 4 g/dL (ref 3.5–5.2)
Alkaline Phosphatase: 75 U/L (ref 39–117)
BUN: 20 mg/dL (ref 6–23)
CO2: 25 meq/L (ref 19–32)
Calcium: 9.1 mg/dL (ref 8.4–10.5)
Chloride: 105 meq/L (ref 96–112)
Creatinine, Ser: 0.83 mg/dL (ref 0.40–1.20)
GFR: 71.68 mL/min (ref >60.00)
Glucose, Bld: 88 mg/dL (ref 70–99)
Potassium: 4.4 meq/L (ref 3.5–5.1)
Sodium: 138 meq/L (ref 135–145)
Total Bilirubin: 0.6 mg/dL (ref 0.2–1.2)
Total Protein: 7 g/dL (ref 6.0–8.3)

## 2024-01-06 LAB — CBC WITH DIFFERENTIAL/PLATELET
Basophils Absolute: 0 10*3/uL (ref 0.0–0.1)
Basophils Relative: 1 % (ref 0.0–3.0)
Eosinophils Absolute: 0.2 10*3/uL (ref 0.0–0.7)
Eosinophils Relative: 3.9 % (ref 0.0–5.0)
HCT: 42.7 % (ref 36.0–46.0)
Hemoglobin: 14.4 g/dL (ref 12.0–15.0)
Lymphocytes Relative: 42.9 % (ref 12.0–46.0)
Lymphs Abs: 2.2 10*3/uL (ref 0.7–4.0)
MCHC: 33.7 g/dL (ref 30.0–36.0)
MCV: 89.9 fL (ref 78.0–100.0)
Monocytes Absolute: 0.6 10*3/uL (ref 0.1–1.0)
Monocytes Relative: 11.6 % (ref 3.0–12.0)
Neutro Abs: 2 10*3/uL (ref 1.4–7.7)
Neutrophils Relative %: 40.6 % — ABNORMAL LOW (ref 43.0–77.0)
Platelets: 191 10*3/uL (ref 150.0–400.0)
RBC: 4.75 Mil/uL (ref 3.87–5.11)
RDW: 13.7 % (ref 11.5–15.5)
WBC: 5 10*3/uL (ref 4.0–10.5)

## 2024-01-06 LAB — TSH: TSH: 3.34 u[IU]/mL (ref 0.35–5.50)

## 2024-01-06 LAB — TROPONIN I (HIGH SENSITIVITY): High Sens Troponin I: 6 ng/L (ref 2–17)

## 2024-01-06 NOTE — Patient Instructions (Signed)
Nice to see you. If you have recurrent chest pain go to the emergency room.  If your shortness of breath gets worse go to the emergency room. Will contact you with your lab results.

## 2024-01-06 NOTE — Progress Notes (Signed)
 Camellia Her, MD Phone: [phone]  Cindy Stein is a 68 y.o. female who presents today for same day visit.   Chest pain: Patient notes Saturday she developed some left lower chest discomfort.  She had gotten up to get her husband his insulin and when she was walking back she had a catch in her left lower chest.  Notes it was extremely uncomfortable and she could not take a deep breath with this.  She notes it lasted about 2 hours and resolved.  Her daughter reports the patient drank some Pepsi and had a burp and had some improvement in discomfort about 20 minutes later.  She still has some dyspnea.  She feels fatigued.  She notes no edema.  No lightheadedness.  Notes her blood pressure yesterday morning was 80/60 though has improved since then.  She has not missed any doses of Eliquis .  Social History   Tobacco Use  Smoking Status Never  Smokeless Tobacco Never    Current Outpatient Medications on File Prior to Visit  Medication Sig Dispense Refill   apixaban  (ELIQUIS ) 5 MG TABS tablet Take 1 tablet (5 mg total) by mouth 2 (two) times daily. 180 tablet 1   carvedilol  (COREG ) 6.25 MG tablet TAKE ONE TABLET BY MOUTH TWICE A DAY 180 tablet 1   dexamethasone  0.5 MG/5ML elixir Take 5 mLs (0.5 mg total) by mouth daily. Swish and spit 5 mL once in the morning and once at night. 100 mL 0   etanercept (ENBREL) 50 MG/ML injection Inject into the skin.     folic acid  (FOLVITE ) 1 MG tablet Take 1 mg by mouth daily.     methocarbamol  (ROBAXIN ) 500 MG tablet Take 500 mg by mouth 3 (three) times daily.     methotrexate (RHEUMATREX) 2.5 MG tablet Take 20 mg by mouth once a week.     naproxen sodium (ALEVE) 220 MG tablet Take 220 mg by mouth.     olmesartan  (BENICAR ) 20 MG tablet Take 1 tablet (20 mg total) by mouth daily. 90 tablet 1   ondansetron  (ZOFRAN -ODT) 4 MG disintegrating tablet TAKE ONE TABLET (4 MG TOTAL) BY MOUTH EVERY EIGHT HOURS AS NEEDED FOR NAUSEA OR VOMITING. 15 tablet 0    predniSONE  (DELTASONE ) 10 MG tablet      sertraline  (ZOLOFT ) 50 MG tablet TAKE ONE TABLET BY MOUTH DAILY 90 tablet 1   sucralfate  (CARAFATE ) 1 g tablet TAKE ONE TABLET (1 GRAM TOTAL) BY MOUTH FOUR (FOUR) TIMES DAILY - WITH MEALS AND AT BEDTIME. 30 tablet 1   pantoprazole  (PROTONIX ) 40 MG tablet Take 1 tablet (40 mg total) by mouth daily. (Patient not taking: Reported on 08/22/2023) 30 tablet 2   No current facility-administered medications on file prior to visit.     ROS see history of present illness  Objective  Physical Exam Vitals:   01/06/24 0812  BP: 130/84  Pulse: 74  Temp: 97.9 F (36.6 C)  SpO2: 97%    BP Readings from Last 3 Encounters:  01/06/24 130/84  10/03/23 110/72  09/22/23 (!) 142/82   Wt Readings from Last 3 Encounters:  01/06/24 208 lb (94.3 kg)  10/03/23 198 lb 6.4 oz (90 kg)  09/22/23 198 lb 12.8 oz (90.2 kg)    Physical Exam Constitutional:      General: She is not in acute distress.    Appearance: She is not diaphoretic.  Cardiovascular:     Rate and Rhythm: Normal rate and regular rhythm.     Heart sounds:  Normal heart sounds.  Pulmonary:     Effort: Pulmonary effort is normal.     Breath sounds: Normal breath sounds.  Chest:    Abdominal:     General: There is no distension.     Tenderness: There is no abdominal tenderness.  Skin:    General: Skin is warm and dry.  Neurological:     Mental Status: She is alert.    EKG: Sinus rhythm, rate 76, no ischemic changes, no arrhythmia  Assessment/Plan: Please see individual problem list.  Chest pain, unspecified type Assessment & Plan: Acute onset issue over the weekend.  Chest pain has since resolved.  Does have some continued dyspnea.  Discussed the potential that this could be a cardiac issue.  Could also be a GI issue though I am not sure that that would cause any shortness of breath.  Musculoskeletal issue could be contributing.  It would seem less likely to be related to a PE given  that she is on Eliquis  and has not missed any doses.  Acceptable range vitals also argue against PE.  EKG completed today.  Will also get a chest x-ray.  Lab work as outlined.  Discussed if all of this workup is unremarkable we may need to consider more advanced imaging to rule out a blood clot.  Patient was advised to go to the emergency room if she developed chest pain again or if she develop worsening dyspnea.  Orders: -     EKG 12-Lead -     Comprehensive metabolic panel -     CBC with Differential/Platelet -     TSH -     Troponin I (High Sensitivity) -     DG Chest 2 View; Future     Return if symptoms worsen or fail to improve.   Camellia Her, MD [facility] Primary Care [facility]

## 2024-01-06 NOTE — Assessment & Plan Note (Addendum)
 Acute onset issue over the weekend.  Chest pain has since resolved.  Does have some continued dyspnea.  Discussed the potential that this could be a cardiac issue.  Could also be a GI issue though I am not sure that that would cause any shortness of breath.  Musculoskeletal issue could be contributing.  It would seem less likely to be related to a PE given that she is on Eliquis  and has not missed any doses.  Acceptable range vitals also argue against PE.  EKG completed today.  Will also get a chest x-ray.  Lab work as outlined.  Discussed if all of this workup is unremarkable we may need to consider more advanced imaging to rule out a blood clot.  Patient was advised to go to the emergency room if she developed chest pain again or if she develop worsening dyspnea.

## 2024-01-07 ENCOUNTER — Telehealth: Payer: Self-pay

## 2024-01-07 ENCOUNTER — Ambulatory Visit
Admission: RE | Admit: 2024-01-07 | Discharge: 2024-01-07 | Disposition: A | Discharge status: Home / Self Care | Payer: Medicare Other | Source: Ambulatory Visit | Attending: Family Medicine | Admitting: Family Medicine

## 2024-01-07 DIAGNOSIS — R0602 Shortness of breath: Secondary | ICD-10-CM | POA: Diagnosis not present

## 2024-01-07 DIAGNOSIS — R918 Other nonspecific abnormal finding of lung field: Secondary | ICD-10-CM | POA: Diagnosis not present

## 2024-01-07 DIAGNOSIS — R079 Chest pain, unspecified: Secondary | ICD-10-CM | POA: Diagnosis not present

## 2024-01-07 DIAGNOSIS — R0600 Dyspnea, unspecified: Secondary | ICD-10-CM | POA: Diagnosis not present

## 2024-01-07 DIAGNOSIS — D1809 Hemangioma of other sites: Secondary | ICD-10-CM | POA: Diagnosis not present

## 2024-01-07 MED ORDER — IOHEXOL 350 MG/ML SOLN
75.0000 mL | Freq: Once | INTRAVENOUS | Status: AC | PRN
  Administered 2024-01-07: 75 mL via INTRAVENOUS

## 2024-01-07 NOTE — Telephone Encounter (Signed)
 Copied from CRM [id number]. Topic: General - Call Back - No Documentation >> Jan 07, 2024 11:52 AM Erling Hawthorne B wrote: Reason for CRM: Pt stated that she is needing a callback after receiving a missed call yesterday from the office.

## 2024-01-07 NOTE — Telephone Encounter (Signed)
 Pt responded to on mychart by Dr. Lovetta Rucks

## 2024-01-07 NOTE — Telephone Encounter (Signed)
CT angio ordered to evaluate for PE. Please let the stat referral team know.

## 2024-01-09 ENCOUNTER — Encounter: Payer: Self-pay | Admitting: Family Medicine

## 2024-01-09 ENCOUNTER — Other Ambulatory Visit: Payer: Self-pay | Admitting: Family Medicine

## 2024-01-09 DIAGNOSIS — R0602 Shortness of breath: Secondary | ICD-10-CM

## 2024-01-09 MED ORDER — FUROSEMIDE 20 MG PO TABS: 20.0000 mg | ORAL_TABLET | Freq: Every day | ORAL | 0 refills | Status: DC

## 2024-01-09 NOTE — Telephone Encounter (Signed)
 Pt spoke to Hale Drone. RN

## 2024-01-12 ENCOUNTER — Encounter: Payer: Self-pay | Admitting: Student

## 2024-01-12 ENCOUNTER — Ambulatory Visit: Payer: Medicare Other | Attending: Student | Admitting: Student

## 2024-01-12 ENCOUNTER — Other Ambulatory Visit (INDEPENDENT_AMBULATORY_CARE_PROVIDER_SITE_OTHER): Payer: Medicare Other

## 2024-01-12 ENCOUNTER — Telehealth: Payer: Self-pay | Admitting: Cardiovascular Disease

## 2024-01-12 VITALS — BP 126/80 | HR 86 | Ht 62.0 in | Wt 209.6 lb

## 2024-01-12 DIAGNOSIS — I959 Hypotension, unspecified: Secondary | ICD-10-CM

## 2024-01-12 DIAGNOSIS — R110 Nausea: Secondary | ICD-10-CM

## 2024-01-12 DIAGNOSIS — R0609 Other forms of dyspnea: Secondary | ICD-10-CM | POA: Diagnosis not present

## 2024-01-12 DIAGNOSIS — R0602 Shortness of breath: Secondary | ICD-10-CM

## 2024-01-12 DIAGNOSIS — R61 Generalized hyperhidrosis: Secondary | ICD-10-CM | POA: Diagnosis not present

## 2024-01-12 DIAGNOSIS — R002 Palpitations: Secondary | ICD-10-CM | POA: Diagnosis not present

## 2024-01-12 MED ORDER — CARVEDILOL 3.125 MG PO TABS: 3.1250 mg | ORAL_TABLET | Freq: Two times a day (BID) | ORAL | 3 refills | Status: DC

## 2024-01-12 NOTE — Patient Instructions (Addendum)
 Medication Instructions:  Your physician has recommended you make the following change in your medication:   STOP Carvedilol  6.25 mg START Carvedilol  3.125 mg twice a day Hold Olmesartan   Monitor blood pressures. If blood pressure consistently remains greater than 130/80 then add back the olmesartan .    *If you need a refill on your cardiac medications before your next appointment, please call your pharmacy*   Lab Work: None  If you have labs (blood work) drawn today and your tests are completely normal, you will receive your results only by: MyChart Message (if you have MyChart) OR A paper copy in the mail If you have any lab test that is abnormal or we need to change your treatment, we will call you to review the results.   Testing/Procedures: Your physician has requested that you have an echocardiogram. Echocardiography is a painless test that uses sound waves to create images of your heart. It provides your doctor with information about the size and shape of your heart and how well your heart's chambers and valves are working. This procedure takes approximately one hour. There are no restrictions for this procedure. Please do NOT wear cologne, perfume, aftershave, or lotions (deodorant is allowed). Please arrive 15 minutes prior to your appointment time.  Please note: We ask at that you not bring children with you during ultrasound (echo/ vascular) testing. Due to room size and safety concerns, children are not allowed in the ultrasound rooms during exams. Our front office staff cannot provide observation of children in our lobby area while testing is being conducted. An adult accompanying a patient to their appointment will only be allowed in the ultrasound room at the discretion of the ultrasound technician under special circumstances. We apologize for any inconvenience.    Follow-Up: At [facility], you and your health needs are our priority.  As part of our  continuing mission to provide you with exceptional heart care, we have created designated Provider Care Teams.  These Care Teams include your primary Cardiologist (physician) and Advanced Practice Providers (APPs -  Physician Assistants and Nurse Practitioners) who all work together to provide you with the care you need, when you need it.   Your next appointment:   4-6 week(s) after echocardiogram   Provider:   Morey Ar, NP

## 2024-01-12 NOTE — Addendum Note (Signed)
 Addended by: Thressa Flora D on: 01/12/2024 02:11 PM   Modules accepted: Orders

## 2024-01-12 NOTE — Telephone Encounter (Signed)
  Pt c/o BP issue: STAT if pt c/o blurred vision, one-sided weakness or slurred speech  1. What are your last 5 BP readings?  Yesterday 85/45 HR 110  2. Are you having any other symptoms (ex. Dizziness, headache, blurred vision, passed out)? Nausea and sweating when BP is low and HR is elevated   3. What is your BP issue?  The patient stated that over the weekend, her blood pressure was running low at 85/45, with a heart rate of 110. She reported feeling nauseous and sweaty. They went to the emergency department, but there were over 100 people waiting, and she was told she wouldn't be seen for 15 hours, so they went home. She stopped taking her Carvedilol  and Olmesartan , and her blood pressure increased to 135/90. Today, it is 130/71, with a heart rate of 81. She has not taken either medication since yesterday. She wants to know if she can be seen today or if she needs a change in her medications.

## 2024-01-12 NOTE — Progress Notes (Signed)
 Cardiology Clinic Note   Date: 01/12/2024 ID: Cindy Stein, DOB [birth year], MRN [id number]  Primary Cardiologist:  Antionette Kirks, MD  Chief Complaint   Cindy Stein is a 68 y.o. female who presents to the clinic today for evaluation of hypotension.   Patient Profile   Cindy Stein is followed by Dr. Alvenia Aus for the history outlined below.      Past medical history significant for: Palpitations/PVCs. 7-day ZIO 07/22/2019: NSR with average HR 73 bpm. 5 beat run of SVT. Rare PACs. No PVCs.  Hypertension. Hyperlipidemia. DVT/PE. Left lower extremity venous ultrasound 03/20/2020: Nonocclusive DVT in left femoral, popliteal, posterior tibial and peroneal veins. CTA chest PE protocol 03/21/2020: Bilateral pulmonary arterial emboli, left greater than right. RV to LV ratio of 1.4, no pericardial effusion. Large volume of pulmonary emboli in the lungs bilaterally, with dilatation of the right atrium and right ventricle (RV to LV ratio of 1.4) indicative of elevated right-sided heart pressures and right heart strain. These findings have been shown to be associated with a increased morbidity and mortality in the setting pulmonary embolism. Thrombolysis bilateral pulmonary arteries, mechanical thrombectomy bilateral pulmonary arteries 03/22/2020. IVC filter insertion 12/11/2020 in the setting of knee replacement. IVC filter removal 04/19/2021. CTA chest PE protocol 08/12/2023: Pulmonary embolus seen in lower lobe branch of right pulmonary artery. Although RV/LV ratio is elevated at 1.25, this embolus appears to be too peripheral to result in right heart strain. Echo 08/13/2023: EF 60 to 65%.  No RWMA.  Grade I DD.  Normal RV function.  Mild RVH.  Mild MR/AI.  Aortic valve sclerosis without stenosis. Bilateral lower extremity venous ultrasound 08/13/2023: No evidence of DVT. CTA chest PE protocol 01/07/2024: No evidence of PE.  Mild diffuse patchy groundglass opacities throughout both lungs,  nonspecific but can be seen in the setting of atypical infection or pulmonary edema.  Small hiatal hernia. RA.  In summary, patient has a history of atypical chest pain. She underwent LHC in July 2020 which showed normal coronary arteries, normal LV function, and mildly elevated LVEDP.  7 day Zio showed 1 run of SVT as detailed above. She has a history of PE/DVT. She had an IVC filter placed in January 2022 for knee replacement and removal in May 2022. She had atypical chest pain in 2022 with a normal nuclear stress test. Echo at that time showed normal LV function.   Patient was last seen in the office by Dr. Alvenia Aus on 04/24/2023 for routine follow up. She was doing well at that time with no cardiac complaints. BP was controlled. No medication changes made.   Hospital admission 08/12/2023 to 08/13/2023 for hypotension and found to have PE. Echo at that time showed normal LV function as detailed above. She was discharged on Eliquis .   Patient was evaluated by PCP on 01/06/2024 for left lower chest discomfort while walking and worsened with deep breathing lasting 2 hours. She noted her BP was 80/60 at home. BP at the time of her visit was 130/84. She underwent CTA and was negative for PE as detailed above.   Patient contacted the office on 2/10 with complaints of low BP. Per triage RN: "Called patient, she states she has been having some lower blood pressure readings. She became very nauseous and sweating. She went to ED but was not able to stay, she went home- did not take her medications the last few days. Blood pressure was 137/84 HR 84. She states she does not want to continue off her  medication, but feels it makes her feel bad. She requested an appointment to come in to speak to someone about her blood pressure concerns."     History of Present Illness    Today, patient is accompanied by her daughter. She reports 2 weeks ago she had an episode where she felt nauseated and broke out into a sweat and her  BP was in the 80s/40s and heart rate was 120. Then 3 days ago she had a similar episode. She went to the ED but wait times were >15 hours so she did not stay. She did not take carvedilol  or olmesartan  yesterday or today. She reports she typically takes carvedilol  at around 8 am and then second dose at noon with lunch. Both episodes occurred after taking the second dose of carvedilol .  She has not had any further episodes of nausea or diaphoresis since stopping medications. She reports episode of palpitations 2 nights ago typical of previous palpitations. Patient was recently started on Lasix  about 1 week ago by her PCP for concern of fluid in lungs per CTA. She reports she is not really urinating more than usual. Her weight has been increasing. She attributes some of that to the holidays. Normal weight is typically around 190 lb. She denies lower extremity edema, abdominal bloating/fullness or early satiety. No orthopnea or PND. She does have increased dyspnea with minimal exertion. She does not do formal exercise but stays very busy performing light to moderate household chores. Patient's daughter says she is constantly moving around and doing things at home. Patient states she is finding she needs to rest more while doing activities.      ROS: All other systems reviewed and are otherwise negative except as noted in History of Present Illness.  EKGs/Labs Reviewed       EKG is not ordered today.   01/06/2024: ALT 24; AST 19; BUN 20; Creatinine, Ser 0.83; Potassium 4.4; Sodium 138   01/06/2024: Hemoglobin 14.4; WBC 5.0   01/06/2024: TSH 3.34     Physical Exam    VS:  BP 126/80   Pulse 86   Ht 5\' 2"  (1.575 m)   Wt 209 lb 9.6 oz (95.1 kg)   SpO2 96%   BMI 38.34 kg/m  , BMI Body mass index is 38.34 kg/m.  GEN: Well nourished, well developed, in no acute distress. Neck: No JVD or carotid bruits. Cardiac:  RRR. No murmurs. No rubs or gallops.   Respiratory:  Respirations regular and unlabored.  Clear to auscultation without rales, wheezing or rhonchi. GI: Soft, nontender, nondistended. Extremities: Radials/DP/PT 2+ and equal bilaterally. No clubbing or cyanosis. No edema.  Skin: Warm and dry, no rash. Neuro: Strength intact.  Assessment & Plan   Hypotension/diaphoresis/nausea Patient with 2 episodes of low BP ~80/40 with last episode 3 days ago. BP today 126/80. She has been holding her medications for 2 days. She reports she takes olmesartan  and carvedilol  at around 8 am and then second dose of carvedilol  around noon. Both episodes of hypotension occurred after second dose of carvedilol .  -Decrease carvedilol  to 3.125 mg bid. Increase time between doses. Take first dose at breakfast and second dose at dinnertime.  -Check BP around lunch time. BP log provided.  -Continue to hold olmesartan . If BP consistently >130/80 restart olmesartan .   Dyspnea Patient reports increased dyspnea with minimal exertion for the past several weeks. She denies lower extremity edema, abdominal fullness, orthopnea or PND. She was recently started on Lasix  by PCP 1 week  ago for fluid on her lungs. She denies increased urination. She weighs daily and reports her weight is up. She attributes some of the weight gain to the holidays. She had repeat lab work today.  -Echo for further evaluation.  -Continue Lasix .  -Continue to weigh daily.   Palpitations 7 day Zio August 2020  showed a 5 beat run of SVT and rare PACs. Patient reports episode of palpitations 2 nights ago. She has not noted an increase in palpitations in the last 2 days since stopping carvedilol .  -Restart carvedilol  at lower dose 3.125 mg bid.   Disposition: Echo. Carvedilol  3.125 mg bid. Continue to hold olmesartan . Restart if BP consistently >130/80. Check BP at lunch time, BP log provided. Return in 4-6 weeks or sooner as needed.          Signed, Lonell Rives. Letzy Gullickson, DNP, NP-C

## 2024-01-12 NOTE — Telephone Encounter (Signed)
 Called patient, she states she has been having some lower blood pressure readings. She became very nauseous and sweating. She went to ED but was not able to stay, she went home- did not take her medications the last few days. Blood pressure was 137/84 HR 84. She states she does not want to continue off her medication, but feels it makes her feel bad. She requested an appointment to come in to speak to someone about her blood pressure concerns.  Patient scheduled for today with NP at 2:45 PM.   Patient verbalized understanding.

## 2024-01-13 ENCOUNTER — Encounter: Payer: Self-pay | Admitting: Family Medicine

## 2024-01-13 LAB — BASIC METABOLIC PANEL
BUN: 21 mg/dL (ref 6–23)
CO2: 28 meq/L (ref 19–32)
Calcium: 9.1 mg/dL (ref 8.4–10.5)
Chloride: 106 meq/L (ref 96–112)
Creatinine, Ser: 0.84 mg/dL (ref 0.40–1.20)
GFR: 70.65 mL/min (ref >60.00)
Glucose, Bld: 98 mg/dL (ref 70–99)
Potassium: 3.8 meq/L (ref 3.5–5.1)
Sodium: 142 meq/L (ref 135–145)

## 2024-01-13 LAB — BRAIN NATRIURETIC PEPTIDE: Brain Natriuretic Peptide: 29 pg/mL (ref <100)

## 2024-01-16 ENCOUNTER — Other Ambulatory Visit: Payer: Self-pay | Admitting: Family Medicine

## 2024-01-16 ENCOUNTER — Other Ambulatory Visit: Payer: Medicare Other

## 2024-01-16 DIAGNOSIS — Z5181 Encounter for therapeutic drug level monitoring: Secondary | ICD-10-CM | POA: Diagnosis not present

## 2024-01-16 NOTE — Addendum Note (Signed)
Addended by: Glori Luis on: 01/16/2024 02:34 PM   Modules accepted: Orders

## 2024-01-17 LAB — BASIC METABOLIC PANEL
BUN: 21 mg/dL (ref 7–25)
CO2: 26 mmol/L (ref 20–32)
Calcium: 9.3 mg/dL (ref 8.6–10.4)
Chloride: 102 mmol/L (ref 98–110)
Creat: 0.85 mg/dL (ref 0.50–1.05)
Glucose, Bld: 82 mg/dL (ref 65–99)
Potassium: 4.1 mmol/L (ref 3.5–5.3)
Sodium: 138 mmol/L (ref 135–146)

## 2024-01-19 ENCOUNTER — Encounter: Payer: Self-pay | Admitting: Family Medicine

## 2024-01-19 DIAGNOSIS — S8002XA Contusion of left knee, initial encounter: Secondary | ICD-10-CM | POA: Diagnosis not present

## 2024-01-26 DIAGNOSIS — Z96653 Presence of artificial knee joint, bilateral: Secondary | ICD-10-CM | POA: Diagnosis not present

## 2024-01-26 DIAGNOSIS — Z96651 Presence of right artificial knee joint: Secondary | ICD-10-CM | POA: Diagnosis not present

## 2024-02-05 ENCOUNTER — Ambulatory Visit: Payer: Medicare Other | Attending: Student

## 2024-02-05 DIAGNOSIS — R0609 Other forms of dyspnea: Secondary | ICD-10-CM

## 2024-02-06 LAB — ECHOCARDIOGRAM COMPLETE
AV Mean grad: 3 mmHg
AV Peak grad: 4.7 mmHg
Ao pk vel: 1.08 m/s
Area-P 1/2: 3.85 cm2
S' Lateral: 2.4 cm

## 2024-03-03 NOTE — Progress Notes (Deleted)
 Cardiology Clinic Note   Date: 03/03/2024 ID: Rilyn Scroggs, DOB 1956/08/04, MRN [id number]  Primary Cardiologist:  Lorine Bears, MD  Chief Complaint   Danali Marinos is a 68 y.o. female who presents to the clinic today for ***  Patient Profile   Jamilet Ambroise is followed by *** for the history outlined below.       Past medical history significant for: Palpitations/PVCs. 7-day ZIO 07/22/2019: NSR with average HR 73 bpm. 5 beat run of SVT. Rare PACs. No PVCs.  Hypertension. Hyperlipidemia. DVT/PE. Left lower extremity venous ultrasound 03/20/2020: Nonocclusive DVT in left femoral, popliteal, posterior tibial and peroneal veins. CTA chest PE protocol 03/21/2020: Bilateral pulmonary arterial emboli, left greater than right. RV to LV ratio of 1.4, no pericardial effusion. Large volume of pulmonary emboli in the lungs bilaterally, with dilatation of the right atrium and right ventricle (RV to LV ratio of 1.4) indicative of elevated right-sided heart pressures and right heart strain. These findings have been shown to be associated with a increased morbidity and mortality in the setting pulmonary embolism. Thrombolysis bilateral pulmonary arteries, mechanical thrombectomy bilateral pulmonary arteries 03/22/2020. IVC filter insertion 12/11/2020 in the setting of knee replacement. IVC filter removal 04/19/2021. CTA chest PE protocol 08/12/2023: Pulmonary embolus seen in lower lobe branch of right pulmonary artery. Although RV/LV ratio is elevated at 1.25, this embolus appears to be too peripheral to result in right heart strain. Echo 08/13/2023: EF 60 to 65%.  No RWMA.  Grade I DD.  Normal RV function.  Mild RVH.  Mild MR/AI.  Aortic valve sclerosis without stenosis. Bilateral lower extremity venous ultrasound 08/13/2023: No evidence of DVT. CTA chest PE protocol 01/07/2024: No evidence of PE.  Mild diffuse patchy groundglass opacities throughout both lungs, nonspecific but can be seen in the  setting of atypical infection or pulmonary edema.  Small hiatal hernia. Dyspnea. Echo 02/05/2024: EF 60 to 65%.  No RWMA.  Grade I DD.  Normal global strain.  Normal RV size/function.  Aortic valve sclerosis without stenosis. RA.  In summary, patient has a history of atypical chest pain. She underwent LHC in July 2020 which showed normal coronary arteries, normal LV function, and mildly elevated LVEDP.  7 day Zio showed 1 run of SVT as detailed above. She has a history of PE/DVT. She had an IVC filter placed in January 2022 for knee replacement and removal in May 2022. She had atypical chest pain in 2022 with a normal nuclear stress test. Echo at that time showed normal LV function.   Patient was last seen in the office by Dr. Kirke Corin on 04/24/2023 for routine follow up. She was doing well at that time with no cardiac complaints. BP was controlled. No medication changes made.   Hospital admission 08/12/2023 to 08/13/2023 for hypotension and found to have PE. Echo at that time showed normal LV function as detailed above. She was discharged on Eliquis.   Patient was evaluated by PCP on 01/06/2024 for left lower chest discomfort while walking and worsened with deep breathing lasting 2 hours. She noted her BP was 80/60 at home. BP at the time of her visit was 130/84. She underwent CTA and was negative for PE as detailed above.   Patient contacted the office on 2/10 with complaints of low BP. Per triage RN: "Called patient, she states she has been having some lower blood pressure readings. She became very nauseous and sweating. She went to ED but was not able to stay, she went home-  did not take her medications the last few days. Blood pressure was 137/84 HR 84. She states she does not want to continue off her medication, but feels it makes her feel bad. She requested an appointment to come in to speak to someone about her blood pressure concerns."   Patient was last seen in the office by me on 01/12/2024 for  evaluation of hypotension. She reported 2 episodes of low BP with readings as low as 80/40 and HR 120. She reported taking carvedilol at 8am and 12pm. At the time of her visit she had held both carvedilol and olmesartan the day prior and the day of. She also reported an episode of palpitations in the evening. She had recently been started on Lasix the week prior by her PCP for concern of fluid in her lungs per CTA. Carvedilol was reduced to 3.125 mg bid and patient was instructed to space doses out to morning and evening. She was instructed to continue to hold olmesartan and restart if BP consistently >130/80.      History of Present Illness    Today, patient ***  Hypotension/diaphoresis/nausea Patient *** -***   Dyspnea Echo March 2025 showed normal LV/RV function, Grade I DD, aortic valve sclerosis without stenosis.  Patient*** -Continue Lasix. *** -Continue to weigh daily.    Palpitations 7 day Zio August 2020  showed a 5 beat run of SVT and rare PACs. Patient *** -Continue carvedilol.   ROS: All other systems reviewed and are otherwise negative except as noted in History of Present Illness.  EKGs/Labs Reviewed        01/06/2024: ALT 24; AST 19 01/16/2024: BUN 21; Creat 0.85; Potassium 4.1; Sodium 138   01/06/2024: Hemoglobin 14.4; WBC 5.0   01/06/2024: TSH 3.34   01/12/2024: Brain Natriuretic Peptide 29  ***  Risk Assessment/Calculations    {Does this patient have ATRIAL FIBRILLATION?:[id number]} No BP recorded.  {Refresh Note OR Click here to enter BP  :1}***        Physical Exam    VS:  There were no vitals taken for this visit. , BMI There is no height or weight on file to calculate BMI.  GEN: Well nourished, well developed, in no acute distress. Neck: No JVD or carotid bruits. Cardiac: *** RRR. No murmurs. No rubs or gallops.   Respiratory:  Respirations regular and unlabored. Clear to auscultation without rales, wheezing or rhonchi. GI: Soft, nontender,  nondistended. Extremities: Radials/DP/PT 2+ and equal bilaterally. No clubbing or cyanosis. No edema ***  Skin: Warm and dry, no rash. Neuro: Strength intact.  Assessment & Plan   ***  Disposition: ***     {Are you ordering a CV Procedure (e.g. stress test, cath, DCCV, TEE, etc)?   Press F2        :[id number]}   Signed, Etta Grandchild. Milagro Belmares, DNP, NP-C

## 2024-03-08 ENCOUNTER — Ambulatory Visit: Payer: Medicare Other | Admitting: Student

## 2024-03-15 ENCOUNTER — Other Ambulatory Visit: Payer: Self-pay

## 2024-03-15 ENCOUNTER — Ambulatory Visit: Admitting: Student

## 2024-03-15 DIAGNOSIS — I10 Essential (primary) hypertension: Secondary | ICD-10-CM

## 2024-03-15 MED ORDER — OLMESARTAN MEDOXOMIL 20 MG PO TABS
20.0000 mg | ORAL_TABLET | Freq: Every day | ORAL | 0 refills | Status: DC
Start: 2024-03-15 — End: 2024-04-01

## 2024-03-18 ENCOUNTER — Ambulatory Visit: Admitting: Student

## 2024-03-18 NOTE — Progress Notes (Signed)
 Cardiology Clinic Note   Date: 03/22/2024 ID: Cindy Stein, DOB 30-Apr-1956, MRN [id number]  Primary Cardiologist:  Antionette Kirks, MD  Chief Complaint   Cindy Stein is a 68 y.o. female who presents to the clinic today for follow up after testing.   Patient Profile   Cindy Stein is followed by Dr. Alvenia Aus for the history outlined below.       Past medical history significant for: Palpitations/PVCs. 7-day ZIO 07/22/2019: NSR with average HR 73 bpm. 5 beat run of SVT. Rare PACs. No PVCs.  Hypertension. Hyperlipidemia. DVT/PE. Left lower extremity venous ultrasound 03/20/2020: Nonocclusive DVT in left femoral, popliteal, posterior tibial and peroneal veins. CTA chest PE protocol 03/21/2020: Bilateral pulmonary arterial emboli, left greater than right. RV to LV ratio of 1.4, no pericardial effusion. Large volume of pulmonary emboli in the lungs bilaterally, with dilatation of the right atrium and right ventricle (RV to LV ratio of 1.4) indicative of elevated right-sided heart pressures and right heart strain. These findings have been shown to be associated with a increased morbidity and mortality in the setting pulmonary embolism. Thrombolysis bilateral pulmonary arteries, mechanical thrombectomy bilateral pulmonary arteries 03/22/2020. IVC filter insertion 12/11/2020 in the setting of knee replacement. IVC filter removal 04/19/2021. CTA chest PE protocol 08/12/2023: Pulmonary embolus seen in lower lobe branch of right pulmonary artery. Although RV/LV ratio is elevated at 1.25, this embolus appears to be too peripheral to result in right heart strain. Echo 08/13/2023: EF 60 to 65%.  No RWMA.  Grade I DD.  Normal RV function.  Mild RVH.  Mild MR/AI.  Aortic valve sclerosis without stenosis. Bilateral lower extremity venous ultrasound 08/13/2023: No evidence of DVT. CTA chest PE protocol 01/07/2024: No evidence of PE.  Mild diffuse patchy groundglass opacities throughout both lungs,  nonspecific but can be seen in the setting of atypical infection or pulmonary edema.  Small hiatal hernia. Dyspnea. Echo 02/05/2024: EF 60 to 65%.  No RWMA.  Grade I DD.  Normal global strain.  Normal RV size/function.  Aortic valve sclerosis without stenosis. RA.  In summary, patient has a history of atypical chest pain. She underwent LHC in July 2020 which showed normal coronary arteries, normal LV function, and mildly elevated LVEDP.  7 day Zio showed 1 run of SVT as detailed above. She has a history of PE/DVT. She had an IVC filter placed in January 2022 for knee replacement and removal in May 2022. She had atypical chest pain in 2022 with a normal nuclear stress test. Echo at that time showed normal LV function.   Patient was last seen in the office by Dr. Alvenia Aus on 04/24/2023 for routine follow up. She was doing well at that time with no cardiac complaints. BP was controlled. No medication changes made.   Hospital admission 08/12/2023 to 08/13/2023 for hypotension and found to have PE. Echo at that time showed normal LV function as detailed above. She was discharged on Eliquis .   Patient was evaluated by PCP on 01/06/2024 for left lower chest discomfort while walking and worsened with deep breathing lasting 2 hours. She noted her BP was 80/60 at home. BP at the time of her visit was 130/84. She underwent CTA and was negative for PE as detailed above.   Patient contacted the office on 2/10 with complaints of low BP. Per triage RN: "Called patient, she states she has been having some lower blood pressure readings. She became very nauseous and sweating. She went to ED but was not able  to stay, she went home- did not take her medications the last few days. Blood pressure was 137/84 HR 84. She states she does not want to continue off her medication, but feels it makes her feel bad. She requested an appointment to come in to speak to someone about her blood pressure concerns."   Patient was last seen in the  office by me on 01/12/2024 for evaluation of hypotension. She reported 2 episodes of low BP with readings as low as 80/40 and HR 120. She reported taking carvedilol  at 8am and 12pm. At the time of her visit she had held both carvedilol  and olmesartan  the day prior and the day of. She also reported an episode of palpitations in the evening. She had recently been started on Lasix  the week prior by her PCP for concern of fluid in her lungs per CTA. Carvedilol  was reduced to 3.125 mg bid and patient was instructed to space doses out to morning and evening. She was instructed to continue to hold olmesartan  and restart if BP consistently >130/80.      History of Present Illness    Today, patient is here alone. She is doing very well. Patient denies shortness of breath, dyspnea on exertion, lower extremity edema, orthopnea or PND. No chest pain, pressure, or tightness. No palpitations. She has been checking her BP twice a day with well controlled readings. She denies lightheadedness, dizziness, presyncope or syncope. She does not follow a regular exercise routine. 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     ROS: All other systems reviewed and are otherwise negative except as noted in History of Present Illness.  EKGs/Labs Reviewed        01/06/2024: ALT 24; AST 19 01/16/2024: BUN 21; Creat 0.85; Potassium 4.1; Sodium 138   01/06/2024: Hemoglobin 14.4; WBC 5.0   01/06/2024: TSH 3.34   01/12/2024: Brain Natriuretic Peptide 29   Physical Exam    VS:  BP 118/80   Pulse 82   Ht 5\' 2"  (1.575 m)   Wt 209 lb 3.2 oz (94.9 kg)   SpO2 96%   BMI 38.26 kg/m  , BMI Body mass index is 38.26 kg/m.  GEN: Well nourished, well developed, in no acute distress. Neck: No JVD or carotid bruits. Cardiac: RRR. No murmurs. No rubs or gallops.   Respiratory:  Respirations regular and unlabored. Clear to auscultation without rales, wheezing or rhonchi. GI: Soft, nontender,  nondistended. Extremities: Radials/DP/PT 2+ and equal bilaterally. No clubbing or cyanosis. No edema.  Skin: Warm and dry, no rash. Neuro: Strength intact.  Assessment & Plan   Hypertension Patient reports BP has been well controlled. She is checking it twice a day. Readings are consistently <130/80. She denies lightheadedness, dizziness, presyncope or syncope.  -Continue olmesartan  and carvedilol .    Dyspnea Echo March 2025 showed normal LV/RV function, Grade I DD, aortic valve sclerosis without stenosis.  Patient denies any further episodes of dyspnea.  -Continue Lasix .  -Continue to weigh daily.    Palpitations 7 day Zio August 2020  showed a 5 beat run of SVT and rare PACs. Patient denies palpitations. RRR on exam today.  -Continue carvedilol .   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 She denies spontaneous bleeding concerns. She is concerned about getting a refill, as her PCP has left the practice and she has not been able to speak to anyone to get assigned a new PCP. Agreed to  send in a 30 day prescription for patient so she does not miss any doses of OAC. She understands this is not a medication we will continue to manage for her. She is provided with resources to obtain a new PCP. -One time Rx for Eliquis .   Disposition: Return in 1 year or sooner as needed.          Signed, Cindy Stein. Cindy Ewart, DNP, NP-C

## 2024-03-22 ENCOUNTER — Encounter: Payer: Self-pay | Admitting: Student

## 2024-03-22 ENCOUNTER — Telehealth: Payer: Self-pay | Admitting: Nurse Practitioner

## 2024-03-22 ENCOUNTER — Ambulatory Visit: Attending: Student | Admitting: Student

## 2024-03-22 VITALS — BP 118/80 | HR 82 | Ht 62.0 in | Wt 209.2 lb

## 2024-03-22 DIAGNOSIS — Z86711 Personal history of pulmonary embolism: Secondary | ICD-10-CM

## 2024-03-22 DIAGNOSIS — R0609 Other forms of dyspnea: Secondary | ICD-10-CM | POA: Diagnosis not present

## 2024-03-22 DIAGNOSIS — I10 Essential (primary) hypertension: Secondary | ICD-10-CM

## 2024-03-22 DIAGNOSIS — Z86718 Personal history of other venous thrombosis and embolism: Secondary | ICD-10-CM | POA: Diagnosis not present

## 2024-03-22 DIAGNOSIS — R002 Palpitations: Secondary | ICD-10-CM

## 2024-03-22 MED ORDER — APIXABAN 5 MG PO TABS: 5.0000 mg | ORAL_TABLET | Freq: Two times a day (BID) | ORAL | 0 refills | Status: DC

## 2024-03-22 NOTE — Patient Instructions (Addendum)
 Medication Instructions:  Your Physician recommend you continue on your current medication as directed.    *If you need a refill on your cardiac medications before your next appointment, please call your pharmacy*  Follow-Up: At [facility], you and your health needs are our priority.  As part of our continuing mission to provide you with exceptional heart care, our providers are all part of one team.  This team includes your primary Cardiologist (physician) and Advanced Practice Providers or APPs (Physician Assistants and Nurse Practitioners) who all work together to provide you with the care you need, when you need it.  Your next appointment:   1 year(s)  Provider:   Antionette Kirks, MD or Morey Ar, NP    Please work on finding a new primary care provider.

## 2024-03-22 NOTE — Telephone Encounter (Signed)
 Prescription Request  03/22/2024  LOV: Visit date not found  What is the name of the medication or equipment?  apixaban  (ELIQUIS ) 5 MG TABS tablet   Have you contacted your pharmacy to request a refill? Yes   Which pharmacy would you like this sent to?  [facility] - [place], [place] - [address] [place] [address] Phone: [phone] Fax: [phone]  [facility] [address] [place], [place] - [address] [place] [address] Phone: [phone] Fax: [phone]    Patient notified that their request is being sent to the clinical staff for review and that they should receive a response within 2 business days.   Please advise at [facility] [phone]    Pt states she will be out of pills as of this coming Saturday, 03/27/24. [facility]

## 2024-03-23 NOTE — Telephone Encounter (Signed)
 Detailed vm left informing pt to reach out to her cardiologist for a refill on the eliquis .    (E2C2 PLEASE INFORM PT TO REACH OUT TO CARDIOLOGIST FOR REFILL AND SEND A MESSAGE BACK ONCE COMPLETED )

## 2024-03-24 ENCOUNTER — Ambulatory Visit: Admitting: Dermatology

## 2024-03-24 ENCOUNTER — Encounter: Payer: Self-pay | Admitting: Dermatology

## 2024-03-24 DIAGNOSIS — Z79899 Other long term (current) drug therapy: Secondary | ICD-10-CM

## 2024-03-24 DIAGNOSIS — L650 Telogen effluvium: Secondary | ICD-10-CM

## 2024-03-24 DIAGNOSIS — L814 Other melanin hyperpigmentation: Secondary | ICD-10-CM

## 2024-03-24 DIAGNOSIS — L209 Atopic dermatitis, unspecified: Secondary | ICD-10-CM

## 2024-03-24 DIAGNOSIS — L988 Other specified disorders of the skin and subcutaneous tissue: Secondary | ICD-10-CM

## 2024-03-24 DIAGNOSIS — L309 Dermatitis, unspecified: Secondary | ICD-10-CM

## 2024-03-24 DIAGNOSIS — L817 Pigmented purpuric dermatosis: Secondary | ICD-10-CM | POA: Diagnosis not present

## 2024-03-24 DIAGNOSIS — Z7189 Other specified counseling: Secondary | ICD-10-CM

## 2024-03-24 DIAGNOSIS — L811 Chloasma: Secondary | ICD-10-CM | POA: Diagnosis not present

## 2024-03-24 DIAGNOSIS — L2089 Other atopic dermatitis: Secondary | ICD-10-CM

## 2024-03-24 DIAGNOSIS — W908XXA Exposure to other nonionizing radiation, initial encounter: Secondary | ICD-10-CM

## 2024-03-24 DIAGNOSIS — L578 Other skin changes due to chronic exposure to nonionizing radiation: Secondary | ICD-10-CM

## 2024-03-24 MED ORDER — EUCRISA 2 % EX OINT: TOPICAL_OINTMENT | CUTANEOUS | 1 refills | Status: AC

## 2024-03-24 MED ORDER — MOMETASONE FUROATE 0.1 % EX CREA: TOPICAL_CREAM | CUTANEOUS | 1 refills | Status: DC

## 2024-03-24 NOTE — Patient Instructions (Signed)

## 2024-03-24 NOTE — Progress Notes (Signed)
 Follow Up Visit   Subjective  Cindy Stein is a 68 y.o. female who presents for the following: Rash - peeling of the hands that start as blisters which she has had for many years. Rash on the lower legs that doesn't itch, but doesn't resolve. Pt here today for elastosis of the face for Botox injections.  The following portions of the chart were reviewed this encounter and updated as appropriate: medications, allergies, medical history  Review of Systems:  No other skin or systemic complaints except as noted in HPI or Assessment and Plan.  Objective  Well appearing patient in no apparent distress; mood and affect are within normal limits.  A focused examination was performed of the following areas: The face, hands, and legs  Relevant exam findings are noted in the Assessment and Plan.  Face Rhytides and volume loss.    Assessment & Plan   ELASTOSIS OF SKIN Face Botox Injection - Face Location: See attached image  Informed consent: Discussed risks (infection, pain, bleeding, bruising, swelling, allergic reaction, paralysis of nearby muscles, eyelid droop, double vision, neck weakness, difficulty breathing, headache, undesirable cosmetic result, and need for additional treatment) and benefits of the procedure, as well as the alternatives.  Informed consent was obtained.  Preparation: The area was cleansed with alcohol.  Procedure Details:  Botox was injected into the dermis with a 30-gauge needle. Pressure applied to any bleeding. Ice packs offered for swelling.  Lot Number:  [id number] Expiration:  01/2026  Total Units Injected:  65.5  Plan: Patient was instructed to remain upright for 4 hours. Patient was instructed to avoid massaging the face and avoid vigorous exercise for the rest of the day. Tylenol  may be used for headache.  Allow 2 weeks before returning to clinic for additional dosing as needed. Patient will call for any problems.  OTHER ATOPIC  DERMATITIS   COUNSELING AND COORDINATION OF CARE   MEDICATION MANAGEMENT   MELASMA   SCHAMBERG'S PURPURA   ACTINIC SKIN DAMAGE   LENTIGO    ATOPIC DERMATITIS/hand dermatitis  Exam: Peeling of the hands 4% BSA Chronic and persistent condition with duration or expected duration over one year. Condition is symptomatic/ bothersome to patient. Not currently at goal.  Atopic dermatitis (eczema) is a chronic, relapsing, pruritic condition that can significantly affect quality of life. It is often associated with allergic rhinitis and/or asthma and can require treatment with topical medications, phototherapy, or in severe cases biologic injectable medication (Dupixent; Adbry) or Oral JAK inhibitors.  Treatment Plan: Start Eucrisa  2% ointment BID. Start Mometasone  0.1% cream to aa's QD-BID PRN up to 3d/wk.   Recommend gentle skin care.   Schamberg's Purpura -  Exam: Erythematous, 1 mm red macules lower legs Chronic and persistent condition with duration or expected duration over one year. Condition is bothersome/symptomatic for patient. Currently flared. Treatment Plan: Start Mometasone  0.1% cream QD-BID TO 5 DAYS A WEEK (MONDAY THROUGH FRIDAY) PRN.  For rash on legs.   Topical steroids (such as triamcinolone , fluocinolone, fluocinonide, mometasone , clobetasol, halobetasol, betamethasone, hydrocortisone) can cause thinning and lightening of the skin if they are used for too long in the same area. Your physician has selected the right strength medicine for your problem and area affected on the body. Please use your medication only as directed by your physician to prevent side effects.   Discussed with patient that areas will darken over time.   LENTIGINES Exam: scattered tan macules Due to sun exposure Treatment Plan: Benign-appearing, observe. Recommend daily  broad spectrum sunscreen SPF 30+ to sun-exposed areas, reapply every 2 hours as needed.  Call for any changes  ACTINIC  DAMAGE - chronic, secondary to cumulative UV radiation exposure/sun exposure over time - diffuse scaly erythematous macules with underlying dyspigmentation - Recommend daily broad spectrum sunscreen SPF 30+ to sun-exposed areas, reapply every 2 hours as needed.  - Recommend staying in the shade or wearing long sleeves, sun glasses (UVA+UVB protection) and wide brim hats (4-inch brim around the entire circumference of the hat). - Call for new or changing lesions.  MELASMA Pts TSH normal 01/2024 Exam: reticulated hyperpigmented patches at the arms Chronic and persistent condition with duration or expected duration over one year. Condition is symptomatic/ bothersome to patient. Not currently at goal. Melasma is a chronic; persistent condition of hyperpigmented patches generally on the face, worse in summer due to higher UV exposure.    Heredity; thyroid  disease; sun exposure; pregnancy; birth control pills; epilepsy medication and darker skin may predispose to Melasma.   Recommendations include: - Sun avoidance and daily broad spectrum (UVA/UVB) tinted mineral sunscreen SPF 30+, with Zinc or Titanium Dioxide. - Rx topical bleaching creams (i.e. hydroquinone) is a common treatment but should not be used long term.  Hydroquinones may be mixed with retinoids; vitamin C; steroids; Kojic Acid. - Alastin A-luminate, retinoids, vitamin C, topical tranexamic acid, glycolic acid and kojic acid can be used for brightening while on break from hydroquinone - Rx Azelaic Acid is also a treatment option that is safe for pregnancy (Category B). - OTC Heliocare can be helpful in control and prevention. - Oral Rx with Tranexamic Acid 250 mg - 650 mg po daily can be used for moderate to severe cases especially during summer (contraindications include pregnancy; lactation; hx of PE; hx of DVT; clotting disorder; heart disease; anticoagulant use and upcoming long trips)   - Chemical peels (would need multiple for best  result).  - Lasers and  Microdermabrasion may also be helpful adjunct treatments.  Treatment Plan: Benign, no tx needed.  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 Pts TSH normal 01/2024 Exam: Diffuse thinning of hair, positive hair pull test. Telogen effluvium is a benign, self-limited condition causing increased hair shedding usually for several months. It does not progress to baldness, and the hair eventually grows back on its own. It can be triggered by recent illness, recent surgery, thyroid  disease, low iron stores, vitamin D  deficiency, fad diets or rapid weight loss, hormonal changes such as pregnancy or birth control pills, and some medication. Usually the hair loss starts 2-3 months after the illness or health change. Rarely, it can continue for longer than a year. Treatments options may include oral or topical Minoxidil ; Red Light scalp treatments; Biotin 2.5 mg daily and other options. Treatment Plan: Consider Minoxidil  2.5 mg 1/2 tab po QD pending approval from Dr. Alvenia Aus, pt's cardiologist.  Doses of oral minoxidil  for hair loss are considered 'low dose'. This is because the doses used for hair loss are much lower than the doses which are used for conditions such as high blood pressure (hypertension). The doses used for hypertension are 10-40mg  per day.  Side effects are uncommon at the low doses (up to 2.5 mg/day) used to treat hair loss. Potential side effects, more commonly seen at higher doses, include: Increase in hair growth (hypertrichosis) elsewhere on face and body Temporary hair shedding upon starting medication which may last up to 4 weeks Ankle swelling, fluid  retention, rapid weight gain more than 5 pounds Low blood pressure and feeling lightheaded or dizzy when standing up quickly Fast or irregular heartbeat Headaches   Return in about 4 months (around 07/24/2024) for Botox injections.  Arlinda Lais, CMA, am acting as scribe for  Cindy Collard, MD .   Documentation: I have reviewed the above documentation for accuracy and completeness, and I agree with the above.  Cindy Collard, MD

## 2024-03-29 NOTE — Telephone Encounter (Signed)
 Copied from CRM [id number]. Topic: Complaint (DO NOT CONVERT) - Provider (non-sensitive) >> Mar 29, 2024  9:46 AM Star [place] wrote: Date of Incident: 4/21 Details of complaint: Patient is upset that the doctors office would not refill her Eliquis  How would the patient like to see it resolved? Wants the doctors office to be able to fill this medication and not have to call cardiologist On a scale of 1-10, how was your experience? Refused to answer What would it take to bring it to a 10? Would not answer  Route to Research officer, political party.

## 2024-03-29 NOTE — Telephone Encounter (Signed)
 Tried to reach patient left message to please return call to office to [facility].

## 2024-03-31 ENCOUNTER — Telehealth: Payer: Self-pay

## 2024-03-31 MED ORDER — MINOXIDIL 2.5 MG PO TABS: ORAL_TABLET | ORAL | 1 refills | Status: DC

## 2024-03-31 NOTE — Telephone Encounter (Signed)
 Patient's daughter Gordan Latina informed of conversation between Dr. Alvenia Aus and Dr. Bary Likes. Medication sent to pharmacy. Daughter to contact office if she notice's pt's BP dropping, lightheadedness or dizziness, or swelling of the ankles.

## 2024-03-31 NOTE — Telephone Encounter (Signed)
-----   Message from Celine Collard sent at 03/30/2024  1:41 PM EDT ----- Regarding: RE: Minoxidil for telogen effluvium Odilia Bennett - please advise pt about below and advise of BP readings etc.  May send in Minoxidil 2.5 mg take HALF pill daily in AM.  If not already scheduled by 6 mos, make pt appt by 6 mos. Please copy and paste ALL below messages and put in Phone call note ----- Message ----- From: Wenona Hamilton, MD Sent: 03/29/2024   4:55 PM EDT To: Elta Halter, MD; Molly Angers, CMA Subject: RE: Minoxidil for telogen effluvium            I think it is very reasonable to try low-dose minoxidil from our standpoint.  The patient should check her blood pressure daily for few days after starting the medication. Thanks. ----- Message ----- From: Molly Angers, CMA Sent: 03/24/2024   2:52 PM EDT To: Elta Halter, MD; Wenona Hamilton, MD Subject: Minoxidil for telogen effluvium                Good afternoon,   Patient is experiencing hair loss associated with rheumatoid arthritis and the medication methotrexate. Patient is no longer on methotrexate and Dr. Bary Likes would like to prescribe Minoxidil 2.5 mg 1/2 tab po QD for patient's hair loss. Due to patient's medical history and cardiac issues he would like your opinion as to whether Minoxidil would be safe for the patient to take. Thank you for your time.   Doses of oral minoxidil for hair loss are considered 'low dose'. This is because the doses used for hair loss are much lower than the doses which are used for conditions such as high blood pressure (hypertension). The doses used for hypertension are 10-40mg  per day.  Side effects are uncommon at the low doses (up to 2.5 mg/day) used to treat hair loss. Potential side effects, more commonly seen at higher doses, include: Increase in hair growth (hypertrichosis) elsewhere on face and body Temporary hair shedding upon starting medication which may last up to 4 weeks Ankle swelling,  fluid retention, rapid weight gain more than 5 pounds Low blood pressure and feeling lightheaded or dizzy when standing up quickly Fast or irregular heartbeat Headaches

## 2024-04-01 ENCOUNTER — Ambulatory Visit: Payer: Medicare Other | Admitting: Nurse Practitioner

## 2024-04-01 ENCOUNTER — Encounter: Payer: Self-pay | Admitting: Nurse Practitioner

## 2024-04-01 VITALS — BP 134/78 | HR 58 | Temp 97.8°F | Ht 62.0 in | Wt 207.0 lb

## 2024-04-01 DIAGNOSIS — I10 Essential (primary) hypertension: Secondary | ICD-10-CM

## 2024-04-01 DIAGNOSIS — Z13220 Encounter for screening for lipoid disorders: Secondary | ICD-10-CM | POA: Diagnosis not present

## 2024-04-01 DIAGNOSIS — Z1329 Encounter for screening for other suspected endocrine disorder: Secondary | ICD-10-CM | POA: Diagnosis not present

## 2024-04-01 DIAGNOSIS — R7303 Prediabetes: Secondary | ICD-10-CM

## 2024-04-01 DIAGNOSIS — F32A Depression, unspecified: Secondary | ICD-10-CM | POA: Diagnosis not present

## 2024-04-01 DIAGNOSIS — I2699 Other pulmonary embolism without acute cor pulmonale: Secondary | ICD-10-CM | POA: Diagnosis not present

## 2024-04-01 DIAGNOSIS — Z6837 Body mass index (BMI) 37.0-37.9, adult: Secondary | ICD-10-CM

## 2024-04-01 DIAGNOSIS — M069 Rheumatoid arthritis, unspecified: Secondary | ICD-10-CM

## 2024-04-01 DIAGNOSIS — F419 Anxiety disorder, unspecified: Secondary | ICD-10-CM | POA: Diagnosis not present

## 2024-04-01 DIAGNOSIS — E66812 Obesity, class 2: Secondary | ICD-10-CM

## 2024-04-01 LAB — LIPID PANEL
Cholesterol: 170 mg/dL (ref 0–200)
HDL: 77.9 mg/dL (ref >39.00)
LDL Cholesterol: 77 mg/dL (ref 0–99)
NonHDL: 91.77
Total CHOL/HDL Ratio: 2
Triglycerides: 75 mg/dL (ref 0.0–149.0)
VLDL: 15 mg/dL (ref 0.0–40.0)

## 2024-04-01 LAB — CBC WITH DIFFERENTIAL/PLATELET
Basophils Absolute: 0 10*3/uL (ref 0.0–0.1)
Basophils Relative: 0.7 % (ref 0.0–3.0)
Eosinophils Absolute: 0.1 10*3/uL (ref 0.0–0.7)
Eosinophils Relative: 1 % (ref 0.0–5.0)
HCT: 40.3 % (ref 36.0–46.0)
Hemoglobin: 13.6 g/dL (ref 12.0–15.0)
Lymphocytes Relative: 30.8 % (ref 12.0–46.0)
Lymphs Abs: 1.8 10*3/uL (ref 0.7–4.0)
MCHC: 33.7 g/dL (ref 30.0–36.0)
MCV: 91.4 fl (ref 78.0–100.0)
Monocytes Absolute: 0.5 10*3/uL (ref 0.1–1.0)
Monocytes Relative: 8.8 % (ref 3.0–12.0)
Neutro Abs: 3.5 10*3/uL (ref 1.4–7.7)
Neutrophils Relative %: 58.7 % (ref 43.0–77.0)
Platelets: 218 10*3/uL (ref 150.0–400.0)
RBC: 4.41 Mil/uL (ref 3.87–5.11)
RDW: 12.8 % (ref 11.5–15.5)
WBC: 5.9 10*3/uL (ref 4.0–10.5)

## 2024-04-01 LAB — HEMOGLOBIN A1C: Hgb A1c MFr Bld: 5.7 % (ref 4.6–6.5)

## 2024-04-01 LAB — COMPREHENSIVE METABOLIC PANEL WITH GFR
ALT: 22 U/L (ref 0–35)
AST: 18 U/L (ref 0–37)
Albumin: 4.1 g/dL (ref 3.5–5.2)
Alkaline Phosphatase: 82 U/L (ref 39–117)
BUN: 20 mg/dL (ref 6–23)
CO2: 27 meq/L (ref 19–32)
Calcium: 9.6 mg/dL (ref 8.4–10.5)
Chloride: 103 meq/L (ref 96–112)
Creatinine, Ser: 0.71 mg/dL (ref 0.40–1.20)
GFR: 86.31 mL/min (ref >60.00)
Glucose, Bld: 93 mg/dL (ref 70–99)
Potassium: 4.6 meq/L (ref 3.5–5.1)
Sodium: 137 meq/L (ref 135–145)
Total Bilirubin: 0.7 mg/dL (ref 0.2–1.2)
Total Protein: 7.4 g/dL (ref 6.0–8.3)

## 2024-04-01 LAB — TSH: TSH: 2.48 u[IU]/mL (ref 0.35–5.50)

## 2024-04-01 LAB — VITAMIN D 25 HYDROXY (VIT D DEFICIENCY, FRACTURES): VITD: 26.7 ng/mL — ABNORMAL LOW (ref 30.00–100.00)

## 2024-04-01 MED ORDER — OLMESARTAN MEDOXOMIL 20 MG PO TABS: 20.0000 mg | ORAL_TABLET | Freq: Every day | ORAL | 3 refills | Status: AC

## 2024-04-01 MED ORDER — SERTRALINE HCL 50 MG PO TABS: 50.0000 mg | ORAL_TABLET | Freq: Every day | ORAL | 3 refills | Status: AC

## 2024-04-01 MED ORDER — APIXABAN 5 MG PO TABS: 5.0000 mg | ORAL_TABLET | Freq: Two times a day (BID) | ORAL | 3 refills | Status: AC

## 2024-04-01 NOTE — Progress Notes (Unsigned)
 Bluford Burkitt, NP-C Phone: [phone]  Cindy Stein is a 68 y.o. female who presents today for transfer of care.   Discussed the use of AI scribe software for clinical note transcription with the patient, who gave verbal consent to proceed.  History of Present Illness   Cindy Stein is a 68 year old female with prediabetes and a history of DVT and PE who presents for transfer of care and weight management.  She is experiencing difficulty losing weight despite being active through gardening and caring for horses on her [place]-acre property. She enjoys sweets and consumes a diet rich in potatoes and bread, although she does not eat large meals. She weighs 207 pounds and feels best at 180 pounds.  She has a skin reaction on her legs for which her dermatologist prescribed a 0.1% cream to be applied twice daily, but she feels it is ineffective. She cannot recall the specific condition diagnosed by her dermatologist.  She has a history of prediabetes and is concerned about her blood sugar levels. She has not had recent lab work for her liver, kidney, cholesterol, or sugar levels since her previous doctor left.  She is on Eliquis  twice daily for anticoagulation due to a history of DVTs and PEs in 2021 and 2024. She previously experienced issues with medication refills after her previous doctor left, but she is now seeking to have her prescriptions managed by her current provider.  She is on Enbrel for rheumatoid arthritis, administered weekly. She previously stopped a medication due to hair loss, which was confirmed by her doctor to be a side effect.  Her blood pressure is well-controlled with carvedilol  and Benicar , taken once in the morning and once after lunch. No chest pain, shortness of breath, dizziness, or leg swelling.  She takes medication for anxiety, which she requested two years ago due to stress from building a house. She feels okay with this medication, although she experiences  stress related to her husband's health issues, including his diabetes and dementia.      Social History   Tobacco Use  Smoking Status Never  Smokeless Tobacco Never    Current Outpatient Medications on File Prior to Visit  Medication Sig Dispense Refill   carvedilol  (COREG ) 3.125 MG tablet Take 1 tablet (3.125 mg total) by mouth 2 (two) times daily. 180 tablet 3   Crisaborole  (EUCRISA ) 2 % OINT Apply to hands BID PRN. 60 g 1   dexamethasone  0.5 MG/5ML elixir Take 5 mLs (0.5 mg total) by mouth daily. Swish and spit 5 mL once in the morning and once at night. 100 mL 0   etanercept (ENBREL) 50 MG/ML injection Inject into the skin.     folic acid  (FOLVITE ) 1 MG tablet Take 1 mg by mouth daily.     furosemide  (LASIX ) 20 MG tablet Take 1 tablet (20 mg total) by mouth daily. 30 tablet 0   methocarbamol  (ROBAXIN ) 500 MG tablet Take 500 mg by mouth 3 (three) times daily.     methotrexate (RHEUMATREX) 2.5 MG tablet Take 20 mg by mouth once a week.     minoxidil  (LONITEN ) 2.5 MG tablet Take 1/2 tab po QD. 45 tablet 1   mometasone  (ELOCON ) 0.1 % cream Apply to aa's QD-BID PRN. 45 g 1   naproxen sodium (ALEVE) 220 MG tablet Take 220 mg by mouth.     ondansetron  (ZOFRAN -ODT) 4 MG disintegrating tablet TAKE ONE TABLET (4 MG TOTAL) BY MOUTH EVERY EIGHT HOURS AS NEEDED FOR NAUSEA OR VOMITING.  15 tablet 0   pantoprazole  (PROTONIX ) 40 MG tablet Take 1 tablet (40 mg total) by mouth daily. (Patient not taking: Reported on 03/22/2024) 30 tablet 2   sucralfate  (CARAFATE ) 1 g tablet TAKE ONE TABLET (1 GRAM TOTAL) BY MOUTH FOUR (FOUR) TIMES DAILY - WITH MEALS AND AT BEDTIME. 30 tablet 1   No current facility-administered medications on file prior to visit.     ROS see history of present illness  Objective  Physical Exam Vitals:   04/01/24 0907  BP: 134/78  Pulse: (!) 58  Temp: 97.8 F (36.6 C)  SpO2: 97%    BP Readings from Last 3 Encounters:  04/01/24 134/78  03/22/24 118/80  01/12/24 126/80    Wt Readings from Last 3 Encounters:  04/01/24 207 lb (93.9 kg)  03/22/24 209 lb 3.2 oz (94.9 kg)  01/12/24 209 lb 9.6 oz (95.1 kg)    Physical Exam Constitutional:      General: She is not in acute distress.    Appearance: Normal appearance. She is obese.  HENT:     Head: Normocephalic.  Cardiovascular:     Rate and Rhythm: Normal rate and regular rhythm.     Heart sounds: Normal heart sounds.  Pulmonary:     Effort: Pulmonary effort is normal.     Breath sounds: Normal breath sounds.  Skin:    General: Skin is warm and dry.  Neurological:     General: No focal deficit present.     Mental Status: She is alert.  Psychiatric:        Mood and Affect: Mood normal.        Behavior: Behavior normal.     Assessment/Plan: Please see individual problem list.  Acute pulmonary embolism without acute cor pulmonale, unspecified pulmonary embolism type (HCC) Assessment & Plan: Chronic issue. Indefinite anticoagulation is required per Hematology. Denies shortness of breath today. Previous medication refill issues are resolved. Continue Eliquis  5 mg twice daily and ensure adequate refills are available.   Orders: -     Apixaban ; Take 1 tablet (5 mg total) by mouth 2 (two) times daily.  Dispense: 180 tablet; Refill: 3  Rheumatoid arthritis involving both hands, unspecified whether rheumatoid factor present [facility]) Assessment & Plan: Managed by rheumatology on Enbrel with no new issues. Continue Enbrel as prescribed and follow up with rheumatology as scheduled.    Primary hypertension Assessment & Plan: Blood pressure is controlled with carvedilol  6.25 mg twice daily and Benicar  20 mg daily. Continue both medications as prescribed. Refills sent. Check lab work as outlined.   Orders: -     CBC with Differential/Platelet -     Comprehensive metabolic panel with GFR -     Olmesartan  Medoxomil; Take 1 tablet (20 mg total) by mouth daily.  Dispense: 90 tablet; Refill: 3  Anxiety and  depression Assessment & Plan: Anxiety and depression is well managed with Zoloft  50 mg daily. Continue. PHQ- 2 and GAD- 7 today. Encouraged to contact if worsening symptoms, unusual behavior changes or suicidal thoughts occur.   Orders: -     VITAMIN D  25 Hydroxy (Vit-D Deficiency, Fractures) -     Sertraline  HCl; Take 1 tablet (50 mg total) by mouth daily.  Dispense: 90 tablet; Refill: 3  Prediabetes Assessment & Plan: Preventing progression to diabetes was discussed. Check A1c today. Encourage healthy diet and regular exercise.   Orders: -     Hemoglobin A1c  Class 2 severe obesity due to excess calories with serious comorbidity  and body mass index (BMI) of 37.0 to 37.9 in adult [facility]) Assessment & Plan: A low-carb, high-protein diet and caloric deficit were discussed. Weight loss injections are deferred to focus on dietary changes. Emphasize lifestyle changes for long-term weight maintenance. Implement the diet and reevaluate weight and diet in 3 months. Consider weight loss injections if dietary changes are insufficient and/or A1c is elevated. We will continue to monitor.    Lipid screening -     Lipid panel  Thyroid  disorder screen -     TSH    Return in about 3 months (around 07/02/2024) for Follow up.   Bluford Burkitt, NP-C [facility]

## 2024-04-02 ENCOUNTER — Encounter: Payer: Self-pay | Admitting: Dermatology

## 2024-04-08 ENCOUNTER — Ambulatory Visit (INDEPENDENT_AMBULATORY_CARE_PROVIDER_SITE_OTHER): Payer: Self-pay

## 2024-04-08 ENCOUNTER — Encounter: Payer: Self-pay | Admitting: Nurse Practitioner

## 2024-04-08 VITALS — BP 112/78 | HR 81 | Temp 98.4°F | Ht 62.0 in | Wt 206.6 lb

## 2024-04-08 DIAGNOSIS — R319 Hematuria, unspecified: Secondary | ICD-10-CM

## 2024-04-08 LAB — POCT URINALYSIS DIPSTICK
Bilirubin, UA: NEGATIVE
Glucose, UA: NEGATIVE
Ketones, UA: NEGATIVE
Leukocytes, UA: NEGATIVE
Nitrite, UA: NEGATIVE
Protein, UA: POSITIVE — AB
Spec Grav, UA: >=1.03 — AB (ref 1.010–1.025)
Urobilinogen, UA: 0.2 U/dL
pH, UA: 5.5 (ref 5.0–8.0)

## 2024-04-08 NOTE — Assessment & Plan Note (Addendum)
 D/D includes anticoagulation-related bleeding ( On apixaban ), nephrolithiasis, UTI, atrophic vaginitis/urethritis.  Symptoms improving compared to when it first started.  Recommend UA+micro, urine culture. Dipstick result reviewed negative for nitrite, leukocytes. Hold off on antibiotic for now. If urine culture positive will reach out to the patient with recommendation to start antibiotic.  At the meantime try following: - stay well-hydrated (aim for 6-8 glasses of water per day),  - avoiding bladder irritants such as caffeine, alcohol, spicy foods when symptomatic,  - urinating regularly, completely

## 2024-04-08 NOTE — Assessment & Plan Note (Signed)
 Preventing progression to diabetes was discussed. Check A1c today. Encourage healthy diet and regular exercise.

## 2024-04-08 NOTE — Assessment & Plan Note (Signed)
 Blood pressure is controlled with carvedilol  6.25 mg twice daily and Benicar  20 mg daily. Continue both medications as prescribed. Refills sent. Check lab work as outlined.

## 2024-04-08 NOTE — Progress Notes (Signed)
 Acute Office Visit  Subjective:     Patient ID: Cindy Stein, female    DOB: 04/06/1956, 68 y.o.   MRN: [id number]  Chief Complaint  Patient presents with   Hematuria    Patient is in today for following acute visit:  Hematuria Chronicity: Blood in urine first noticibale 3 days ago when wiping after using bathroom. The problem has been gradually improving since onset. She describes the hematuria as gross (Dark colored urine) hematuria. Clotting in stream: patient unsure. Her pain is at a severity of 0/10. She is experiencing no pain. She describes her urine color as dark red. Irritative symptoms do not include frequency, nocturia or urgency. Obstructive symptoms do not include incomplete emptying. Pertinent negatives include no abdominal pain, chills, dysuria, fever, inability to urinate or vomiting. (Left lower back pain 4 days ago which is now resolved) Her past medical history is significant for kidney stones. Risk factors include anticoagulant (She has not tried any medication for pain.).     Review of Systems  Constitutional:  Negative for chills and fever.  Gastrointestinal:  Negative for abdominal pain and vomiting.  Genitourinary:  Positive for hematuria. Negative for dysuria, frequency, incomplete emptying, nocturia and urgency.   As per HPI    Objective:    BP 112/78   Pulse 81   Temp 98.4 F (36.9 C) (Oral)   Ht 5\' 2"  (1.575 m)   Wt 206 lb 9.6 oz (93.7 kg)   SpO2 95%   BMI 37.79 kg/m    Physical Exam Constitutional:      Appearance: Normal appearance.  HENT:     Head: Normocephalic and atraumatic.     Mouth/Throat:     Mouth: Mucous membranes are moist.  Neck:     Thyroid : No thyroid  mass or thyroid  tenderness.  Cardiovascular:     Rate and Rhythm: Normal rate.  Pulmonary:     Effort: Pulmonary effort is normal.     Breath sounds: Normal breath sounds.  Abdominal:     General: Abdomen is protuberant. Bowel sounds are normal.     Palpations: Abdomen  is soft.     Tenderness: There is no abdominal tenderness. There is left CVA tenderness. There is no right CVA tenderness.  Musculoskeletal:     Right lower leg: No edema.     Left lower leg: No edema.  Skin:    General: Skin is warm.  Neurological:     Mental Status: She is alert and oriented to person, place, and time.  Psychiatric:        Mood and Affect: Mood normal.        Behavior: Behavior normal.     Results for orders placed or performed in visit on 04/08/24  POCT Urinalysis Dipstick  Result Value Ref Range   Color, UA Dark Yellow    Clarity, UA Slightly Cloudly    Glucose, UA Negative Negative   Bilirubin, UA Negative    Ketones, UA Negative    Spec Grav, UA >=1.030 (A) 1.010 - 1.025   Blood, UA Large    pH, UA 5.5 5.0 - 8.0   Protein, UA Positive (A) Negative   Urobilinogen, UA 0.2 0.2 or 1.0 E.U./dL   Nitrite, UA Negative    Leukocytes, UA Negative Negative   Appearance     Odor         Assessment & Plan:  Hematuria, unspecified type Assessment & Plan: D/D includes anticoagulation-related bleeding ( On apixaban ), nephrolithiasis, UTI, atrophic vaginitis/urethritis.  Symptoms improving compared to when it first started.  Recommend UA+micro, urine culture. Dipstick result reviewed negative for nitrite, leukocytes. Hold off on antibiotic for now. If urine culture positive will reach out to the patient with recommendation to start antibiotic.  At the meantime try following: - stay well-hydrated (aim for 6-8 glasses of water per day),  - avoiding bladder irritants such as caffeine, alcohol, spicy foods when symptomatic,  - urinating regularly, completely      Orders: -     Urine Culture -     POCT urinalysis dipstick -     Urinalysis, Routine w reflex microscopic    No follow-ups on file.  Jacklin Mascot, MD

## 2024-04-08 NOTE — Assessment & Plan Note (Signed)
 A low-carb, high-protein diet and caloric deficit were discussed. Weight loss injections are deferred to focus on dietary changes. Emphasize lifestyle changes for long-term weight maintenance. Implement the diet and reevaluate weight and diet in 3 months. Consider weight loss injections if dietary changes are insufficient and/or A1c is elevated. We will continue to monitor.

## 2024-04-08 NOTE — Assessment & Plan Note (Signed)
 Managed by rheumatology on Enbrel with no new issues. Continue Enbrel as prescribed and follow up with rheumatology as scheduled.

## 2024-04-08 NOTE — Progress Notes (Signed)
 Discussed during OV.  Jacklin Mascot, MD

## 2024-04-08 NOTE — Patient Instructions (Signed)
-   We are waiting on urine culture result. It may take 48 hours for the urine culture result to come back. If urine culture positive will reach out to you with recommendation to start antibiotic. At the meantime you can try following: - stay well-hydrated (aim for 6-8 glasses of water per day),  - avoiding bladder irritants such as caffeine, alcohol, spicy foods when symptomatic,  - urinating regularly, completely

## 2024-04-08 NOTE — Assessment & Plan Note (Signed)
 Chronic issue. Indefinite anticoagulation is required per Hematology. Denies shortness of breath today. Previous medication refill issues are resolved. Continue Eliquis  5 mg twice daily and ensure adequate refills are available.

## 2024-04-08 NOTE — Assessment & Plan Note (Signed)
 Anxiety and depression is well managed with Zoloft  50 mg daily. Continue. PHQ- 2 and GAD- 7 today. Encouraged to contact if worsening symptoms, unusual behavior changes or suicidal thoughts occur.

## 2024-04-09 ENCOUNTER — Other Ambulatory Visit: Payer: Self-pay

## 2024-04-09 DIAGNOSIS — R319 Hematuria, unspecified: Secondary | ICD-10-CM

## 2024-04-09 LAB — URINALYSIS, ROUTINE W REFLEX MICROSCOPIC
Bilirubin Urine: NEGATIVE
Ketones, ur: NEGATIVE
Leukocytes,Ua: NEGATIVE
Nitrite: NEGATIVE
Specific Gravity, Urine: >=1.03 — AB (ref 1.000–1.030)
Urine Glucose: NEGATIVE
Urobilinogen, UA: 0.2 (ref 0.0–1.0)
pH: 6 (ref 5.0–8.0)

## 2024-04-09 NOTE — Progress Notes (Signed)
 1. Hematuria, unspecified type (Primary) - Urinalysis; Future  Cindy Mascot, MD

## 2024-04-09 NOTE — Telephone Encounter (Signed)
 Left message for pt to give our office a call back to discuss recent lab results and get scheduled for an appt.  OK for E2C2 to give result note and schedule pt for an appointment in one week with Dr Casimir Cleaver.

## 2024-04-11 LAB — URINE CULTURE
MICRO NUMBER:: 16430822
SPECIMEN QUALITY:: ADEQUATE

## 2024-04-12 NOTE — Telephone Encounter (Signed)
 Lvm for pt to give office a call back. Pt needs labs appt only. Okay to relay and schedule

## 2024-04-13 ENCOUNTER — Ambulatory Visit: Payer: Self-pay

## 2024-04-13 DIAGNOSIS — N3001 Acute cystitis with hematuria: Secondary | ICD-10-CM

## 2024-04-13 MED ORDER — AMOXICILLIN-POT CLAVULANATE 875-125 MG PO TABS: 1.0000 | ORAL_TABLET | Freq: Two times a day (BID) | ORAL | 0 refills | Status: AC

## 2024-04-13 NOTE — Progress Notes (Signed)
 If patient has questions, please offer an appointment.  Thank you,  Jacklin Mascot, MD

## 2024-04-19 NOTE — Telephone Encounter (Signed)
 Last read by Demetria Finch at 12:51PM on 04/17/2024.

## 2024-05-11 ENCOUNTER — Ambulatory Visit: Admitting: Dermatology

## 2024-06-01 ENCOUNTER — Telehealth: Payer: Self-pay | Admitting: Family Medicine

## 2024-06-01 NOTE — Telephone Encounter (Signed)
 Saw patient's spouse in clinic.  She has developed a cough in the meantime. Please send this note to triage at PCP's office re this patient and cough.  Thanks.

## 2024-06-02 NOTE — Telephone Encounter (Signed)
 Noted. Thanks.

## 2024-06-03 ENCOUNTER — Ambulatory Visit: Payer: Self-pay | Admitting: Nurse Practitioner

## 2024-06-03 ENCOUNTER — Encounter: Payer: Self-pay | Admitting: Nurse Practitioner

## 2024-06-03 VITALS — BP 122/80 | HR 70 | Temp 98.1°F | Ht 62.0 in | Wt 207.4 lb

## 2024-06-03 DIAGNOSIS — J069 Acute upper respiratory infection, unspecified: Secondary | ICD-10-CM

## 2024-06-03 MED ORDER — AMOXICILLIN-POT CLAVULANATE 875-125 MG PO TABS: 1.0000 | ORAL_TABLET | Freq: Two times a day (BID) | ORAL | 0 refills | Status: DC

## 2024-06-03 MED ORDER — PREDNISONE 20 MG PO TABS: 20.0000 mg | ORAL_TABLET | Freq: Every day | ORAL | 0 refills | Status: AC

## 2024-06-03 MED ORDER — BENZONATATE 100 MG PO CAPS: 100.0000 mg | ORAL_CAPSULE | Freq: Three times a day (TID) | ORAL | 0 refills | Status: DC | PRN

## 2024-06-03 NOTE — Progress Notes (Signed)
 Established Patient Office Visit  Subjective:  Patient ID: Cindy Stein, female    DOB: 12-25-1955  Age: 68 y.o. MRN: [id number]  CC:  Chief Complaint  Patient presents with   Cough    Cough x 8 days Sore throat & runny nose    HPI  Cindy Stein presents for:  Cough This is a new problem. The current episode started in the past 7 days. The problem has been unchanged. The cough is Non-productive. Associated symptoms include wheezing. Pertinent negatives include no chest pain or shortness of breath. The symptoms are aggravated by lying down.   She has not taken the prescribed antibiotics and uses home remedies like hot tea with lemon and honey.  Past Medical History:  Diagnosis Date   Anxiety    Atypical chest pain    DOE (dyspnea on exertion)    DVT of lower extremity (deep venous thrombosis) (HCC) 03/20/2020   a.) LEFT femoral, popliteal, posterior tibial, and paroneal veins; Tx'd with Rivaroxaban ; b.) embolized on 03/21/2020 requiring PA thrombolysis and mechanical thrombectomy on 03/22/2020   Grade I diastolic dysfunction 03/21/2020   History of immunosuppression    long-term MTX for RA diagnosis   History of PSVT (paroxysmal supraventricular tachycardia) 07/22/2019   a.) brief 5 beat run noted on Zio   HLD (hyperlipidemia)    Hypertension    Kidney stone    in [place]   Osteoarthritis    Plantar fascial fibromatosis    Pulmonary embolus (HCC) 03/21/2020   a.) BILATERAL; required PA thrombolysis and mechanical thrombectomy on 03/22/2020; b.) IVC filter placed prior to knee surgery on 12/12/2020 and removed on 04/19/2021   PVC (premature ventricular contraction)    Rheumatoid arthritis (HCC)    Valgus deformity of great toes, bilateral    Vitamin D  deficiency     Past Surgical History:  Procedure Laterality Date   ANTERIOR AND POSTERIOR REPAIR N/A 07/23/2021   Procedure: ANTERIOR (CYSTOCELE) AND POSTERIOR REPAIR (RECTOCELE);  Surgeon: Janit Alm Agent, MD;  Location: [facility] ORS;  Service: Gynecology;  Laterality: N/A;   CARDIAC CATHETERIZATION  2006   carpel tunnel Bilateral    CATARACT EXTRACTION, BILATERAL     CHOLECYSTECTOMY N/A 2006   COLONOSCOPY WITH PROPOFOL  N/A 06/20/2022   Procedure: COLONOSCOPY WITH PROPOFOL ;  Surgeon: Therisa Bi, MD;  Location: [facility] ENDOSCOPY;  Service: Gastroenterology;  Laterality: N/A;   IVC FILTER INSERTION N/A 12/11/2020   Procedure: IVC FILTER INSERTION;  Surgeon: Marea Selinda RAMAN, MD;  Location: [facility] INVASIVE CV LAB;  Service: Cardiovascular;  Laterality: N/A;   IVC FILTER REMOVAL N/A 04/19/2021   Procedure: IVC FILTER REMOVAL;  Surgeon: Marea Selinda RAMAN, MD;  Location: [facility] INVASIVE CV LAB;  Service: Cardiovascular;  Laterality: N/A;   JOINT REPLACEMENT Right 2021   KNEE   LEFT HEART CATH AND CORONARY ANGIOGRAPHY Left 06/14/2019   Procedure: LEFT HEART CATH AND CORONARY ANGIOGRAPHY;  Surgeon: Darron Deatrice LABOR, MD;  Location: [facility] INVASIVE CV LAB;  Service: Cardiovascular;  Laterality: Left;   PUBOVAGINAL SLING N/A 07/23/2021   Procedure: CARLOYN GLADE;  Surgeon: Janit Alm Agent, MD;  Location: [facility] ORS;  Service: Gynecology;  Laterality: N/A;   PULMONARY THROMBECTOMY Bilateral 03/22/2020   Procedure: PULMONARY THROMBECTOMY / THROMBOLYSIS;  Surgeon: Marea Selinda RAMAN, MD;  Location: [facility] INVASIVE CV LAB;  Service: Cardiovascular;  Laterality: Bilateral;   REPLACEMENT TOTAL KNEE Left 2022   VAGINAL HYSTERECTOMY N/A 07/23/2021   Procedure: HYSTERECTOMY VAGINAL;  Surgeon: Janit Alm Agent, MD;  Location:  [facility] ORS;  Service: Gynecology;  Laterality: N/A;    Family History  Problem Relation Age of Onset   Asthma Mother    Asthma Father    Heart failure Father    Leukemia Sister    Breast cancer Neg Hx     Social History   Socioeconomic History   Marital status: Married    Spouse name: Jeno   Number of children: 1   Years of education: Not on file   Highest education level: Not on file   Occupational History   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  Tobacco Use   Smoking status: Never   Smokeless tobacco: Never  Vaping Use   Vaping status: Never Used  Substance and Sexual Activity   Alcohol use: Not Currently   Drug use: Never   Sexual activity: Not Currently    Birth control/protection: Surgical  Other Topics Concern   Not on file  Social History Narrative   Lives at home with spouse   Social Drivers of Health   Financial Resource Strain: Low Risk  (07/16/2023)   Overall Financial Resource Strain (CARDIA)    Difficulty of Paying Living Expenses: Not hard at all  Food Insecurity: No Food Insecurity (08/22/2023)   Hunger Vital Sign    Worried About Running Out of Food in the Last Year: Never true    Ran Out of Food in the Last Year: Never true  Transportation Needs: No Transportation Needs (08/22/2023)   PRAPARE - Administrator, Civil Service (Medical): No    Lack of Transportation (Non-Medical): No  Physical Activity: Inactive (07/16/2023)   Exercise Vital Sign    Days of Exercise per Week: 0 days    Minutes of Exercise per Session: 0 min  Stress: No Stress Concern Present (07/16/2023)   [employer] of Occupational Health - Occupational Stress Questionnaire    Feeling of Stress : Not at all  Social Connections: Moderately Integrated (07/16/2023)   Social Connection and Isolation Panel    Frequency of Communication with Friends and Family: More than three times a week    Frequency of Social Gatherings with Friends and Family: More than three times a week    Attends Religious Services: 1 to 4 times per year    Active Member of [employer] or Organizations: No    Attends Banker Meetings: Never    Marital Status: Married  Catering manager Violence: Not At Risk (08/22/2023)   Humiliation, Afraid, Rape, and Kick questionnaire    Fear of Current or Ex-Partner: No    Emotionally Abused: No    Physically Abused: No    Sexually  Abused: No     Outpatient Medications Prior to Visit  Medication Sig Dispense Refill   apixaban  (ELIQUIS ) 5 MG TABS tablet Take 1 tablet (5 mg total) by mouth 2 (two) times daily. 180 tablet 3   carvedilol  (COREG ) 3.125 MG tablet Take 1 tablet (3.125 mg total) by mouth 2 (two) times daily. 180 tablet 3   Crisaborole  (EUCRISA ) 2 % OINT Apply to hands BID PRN. 60 g 1   dexamethasone  0.5 MG/5ML elixir Take 5 mLs (0.5 mg total) by mouth daily. Swish and spit 5 mL once in the morning and once at night. 100 mL 0   etanercept (ENBREL) 50 MG/ML injection Inject into the skin.     folic acid  (FOLVITE ) 1 MG tablet Take 1 mg by mouth daily.     furosemide  (LASIX ) 20  MG tablet Take 1 tablet (20 mg total) by mouth daily. 30 tablet 0   hydroxychloroquine (PLAQUENIL) 200 MG tablet TAKE ONE TABLET (200 MG TOTAL) BY MOUTH TWO TIMES DAILY     methocarbamol  (ROBAXIN ) 500 MG tablet Take 500 mg by mouth 3 (three) times daily.     methotrexate (RHEUMATREX) 2.5 MG tablet Take 20 mg by mouth once a week.     minoxidil  (LONITEN ) 2.5 MG tablet Take 1/2 tab po QD. 45 tablet 1   mometasone  (ELOCON ) 0.1 % cream Apply to aa's QD-BID PRN. 45 g 1   naproxen sodium (ALEVE) 220 MG tablet Take 220 mg by mouth.     olmesartan  (BENICAR ) 20 MG tablet Take 1 tablet (20 mg total) by mouth daily. 90 tablet 3   ondansetron  (ZOFRAN -ODT) 4 MG disintegrating tablet TAKE ONE TABLET (4 MG TOTAL) BY MOUTH EVERY EIGHT HOURS AS NEEDED FOR NAUSEA OR VOMITING. 15 tablet 0   sertraline  (ZOLOFT ) 50 MG tablet Take 1 tablet (50 mg total) by mouth daily. 90 tablet 3   sucralfate  (CARAFATE ) 1 g tablet TAKE ONE TABLET (1 GRAM TOTAL) BY MOUTH FOUR (FOUR) TIMES DAILY - WITH MEALS AND AT BEDTIME. 30 tablet 1   No facility-administered medications prior to visit.    No Active Allergies  ROS Review of Systems  Respiratory:  Positive for cough and wheezing. Negative for shortness of breath.   Cardiovascular:  Negative for chest pain.   Negative  unless indicated in HPI.    Objective:    Physical Exam Constitutional:      Appearance: Normal appearance.  HENT:     Right Ear: Tympanic membrane normal. Tympanic membrane is not erythematous.     Left Ear: Tympanic membrane normal. Tympanic membrane is not erythematous.     Nose:     Right Turbinates: Not enlarged.     Left Turbinates: Not enlarged.     Right Sinus: No maxillary sinus tenderness or frontal sinus tenderness.     Left Sinus: No maxillary sinus tenderness or frontal sinus tenderness.     Mouth/Throat:     Mouth: Mucous membranes are moist.     Pharynx: No pharyngeal swelling, oropharyngeal exudate or posterior oropharyngeal erythema.     Tonsils: No tonsillar exudate.  Cardiovascular:     Rate and Rhythm: Normal rate and regular rhythm.  Pulmonary:     Effort: Pulmonary effort is normal.     Breath sounds: Normal breath sounds. No stridor. No wheezing.  Neurological:     General: No focal deficit present.     Mental Status: She is alert and oriented to person, place, and time. Mental status is at baseline.  Psychiatric:        Mood and Affect: Mood normal.        Behavior: Behavior normal.        Thought Content: Thought content normal.        Judgment: Judgment normal.     BP 122/80   Pulse 70   Temp 98.1 F (36.7 C)   Ht 5' 2 (1.575 m)   Wt 207 lb 6.4 oz (94.1 kg)   SpO2 97%   BMI 37.93 kg/m  Wt Readings from Last 3 Encounters:  06/03/24 207 lb 6.4 oz (94.1 kg)  04/08/24 206 lb 9.6 oz (93.7 kg)  04/01/24 207 lb (93.9 kg)     Health Maintenance  Topic Date Due   DTaP/Tdap/Td (1 - Tdap) Never done   DEXA SCAN  Never  done   Colonoscopy  06/21/2023   Medicare Annual Wellness (AWV)  07/15/2024   INFLUENZA VACCINE  07/02/2024   MAMMOGRAM  12/11/2025   Pneumococcal Vaccine: 50+ Years  Completed   Hepatitis B Vaccines  Aged Out   HPV VACCINES  Aged Out   Meningococcal B Vaccine  Aged Out   COVID-19 Vaccine  Discontinued   Hepatitis C  Screening  Discontinued   Zoster Vaccines- Shingrix  Discontinued    There are no preventive care reminders to display for this patient.  Lab Results  Component Value Date   TSH 2.48 04/01/2024   Lab Results  Component Value Date   WBC 5.9 04/01/2024   HGB 13.6 04/01/2024   HCT 40.3 04/01/2024   MCV 91.4 04/01/2024   PLT 218.0 04/01/2024   Lab Results  Component Value Date   NA 137 04/01/2024   K 4.6 04/01/2024   CO2 27 04/01/2024   GLUCOSE 93 04/01/2024   BUN 20 04/01/2024   CREATININE 0.71 04/01/2024   BILITOT 0.7 04/01/2024   ALKPHOS 82 04/01/2024   AST 18 04/01/2024   ALT 22 04/01/2024   PROT 7.4 04/01/2024   ALBUMIN  4.1 04/01/2024   CALCIUM 9.6 04/01/2024   ANIONGAP 8 08/19/2023   EGFR 83 06/18/2022   GFR 86.31 04/01/2024   Lab Results  Component Value Date   CHOL 170 04/01/2024   Lab Results  Component Value Date   HDL 77.90 04/01/2024   Lab Results  Component Value Date   LDLCALC 77 04/01/2024   Lab Results  Component Value Date   TRIG 75.0 04/01/2024   Lab Results  Component Value Date   CHOLHDL 2 04/01/2024   Lab Results  Component Value Date   HGBA1C 5.7 04/01/2024      Assessment & Plan:  URI with cough and congestion Assessment & Plan: Persistent dry cough with nocturnal wheezing, No fever, chest pain or SOB. Pt is afebrile, non toxic appearing, without respiratoty distress.  -Will treat with Augmentin  twice a day for 10 days, prednisone  20 mg for 5 days  and Tessalon  Perles as needed for cough. - Recommended yogurt and probiotics for antibiotic side effects. - Increase fluid intake and rest. - She will let us  know if symptoms not improving.     Other orders -     Amoxicillin -Pot Clavulanate; Take 1 tablet by mouth 2 (two) times daily.  Dispense: 20 tablet; Refill: 0 -     predniSONE ; Take 1 tablet (20 mg total) by mouth daily with breakfast for 5 days.  Dispense: 5 tablet; Refill: 0 -     Benzonatate ; Take 1 capsule (100 mg  total) by mouth 3 (three) times daily as needed.  Dispense: 30 capsule; Refill: 0    Follow-up: No follow-ups on file.   Bladyn Tipps, NP

## 2024-06-23 DIAGNOSIS — J069 Acute upper respiratory infection, unspecified: Secondary | ICD-10-CM | POA: Insufficient documentation

## 2024-06-23 NOTE — Assessment & Plan Note (Signed)
 Persistent dry cough with nocturnal wheezing, No fever, chest pain or SOB. Pt is afebrile, non toxic appearing, without respiratoty distress.  -Will treat with Augmentin  twice a day for 10 days, prednisone  20 mg for 5 days  and Tessalon  Perles as needed for cough. - Recommended yogurt and probiotics for antibiotic side effects. - Increase fluid intake and rest. - She will let us  know if symptoms not improving.

## 2024-07-09 ENCOUNTER — Ambulatory Visit: Payer: Self-pay | Admitting: Nurse Practitioner

## 2024-07-14 ENCOUNTER — Ambulatory Visit (INDEPENDENT_AMBULATORY_CARE_PROVIDER_SITE_OTHER): Payer: Self-pay | Admitting: Dermatology

## 2024-07-14 DIAGNOSIS — L2089 Other atopic dermatitis: Secondary | ICD-10-CM | POA: Diagnosis not present

## 2024-07-14 DIAGNOSIS — K130 Diseases of lips: Secondary | ICD-10-CM | POA: Diagnosis not present

## 2024-07-14 DIAGNOSIS — Z79899 Other long term (current) drug therapy: Secondary | ICD-10-CM

## 2024-07-14 DIAGNOSIS — Z7189 Other specified counseling: Secondary | ICD-10-CM | POA: Diagnosis not present

## 2024-07-14 DIAGNOSIS — L988 Other specified disorders of the skin and subcutaneous tissue: Secondary | ICD-10-CM

## 2024-07-14 MED ORDER — TACROLIMUS 0.1 % EX OINT: TOPICAL_OINTMENT | CUTANEOUS | 3 refills | Status: AC

## 2024-07-14 NOTE — Patient Instructions (Addendum)
 For angular Chelitis   Use until get rx  Start samples of Opzelura - apply daily to twice daily as needed to lips  Start sample of eucrisa  2 % ointment - apply daily to twice daily as needed to lips   Start tacrolimus  0.1 % ointment apply thin layer topically to lips as needed    Due to recent changes in healthcare laws, you may see results of your pathology and/or laboratory studies on MyChart before the doctors have had a chance to review them. We understand that in some cases there may be results that are confusing or concerning to you. Please understand that not all results are received at the same time and often the doctors may need to interpret multiple results in order to provide you with the best plan of care or course of treatment. Therefore, we ask that you please give us  2 business days to thoroughly review all your results before contacting the office for clarification. Should we see a critical lab result, you will be contacted sooner.   If You Need Anything After Your Visit  If you have any questions or concerns for your doctor, please call our main line at [phone] and press option 4 to reach your doctor's medical assistant. If no one answers, please leave a voicemail as directed and we will return your call as soon as possible. Messages left after 4 pm will be answered the following business day.   You may also send us  a message via MyChart. We typically respond to MyChart messages within 1-2 business days.  For prescription refills, please ask your pharmacy to contact our office. Our fax number is [phone].  If you have an urgent issue when the clinic is closed that cannot wait until the next business day, you can page your doctor at the number below.    Please note that while we do our best to be available for urgent issues outside of office hours, we are not available 24/7.   If you have an urgent issue and are unable to reach us , you may choose to seek medical care  at your doctor's office, retail clinic, urgent care center, or emergency room.  If you have a medical emergency, please immediately call 911 or go to the emergency department.  Pager Numbers  - Dr. Hester: [phone]  - Dr. Jackquline: [phone]  - Dr. Claudene: [phone]   In the event of inclement weather, please call our main line at [phone] for an update on the status of any delays or closures.  Dermatology Medication Tips: Please keep the boxes that topical medications come in in order to help keep track of the instructions about where and how to use these. Pharmacies typically print the medication instructions only on the boxes and not directly on the medication tubes.   If your medication is too expensive, please contact our office at [phone] option 4 or send us  a message through MyChart.   We are unable to tell what your co-pay for medications will be in advance as this is different depending on your insurance coverage. However, we may be able to find a substitute medication at lower cost or fill out paperwork to get insurance to cover a needed medication.   If a prior authorization is required to get your medication covered by your insurance company, please allow us  1-2 business days to complete this process.  Drug prices often vary depending on where the prescription is filled and some pharmacies may offer cheaper prices.  The website www.goodrx.com contains coupons for medications through different pharmacies. The prices here do not account for what the cost may be with help from insurance (it may be cheaper with your insurance), but the website can give you the price if you did not use any insurance.  - You can print the associated coupon and take it with your prescription to the pharmacy.  - You may also stop by our office during regular business hours and pick up a GoodRx coupon card.  - If you need your prescription sent electronically to a different pharmacy,  notify our office through [facility] or by phone at [phone] option 4.     Si Usted Necesita Algo Despus de Su Visita  Tambin puede enviarnos un mensaje a travs de Clinical cytogeneticist. Por lo general respondemos a los mensajes de MyChart en el transcurso de 1 a 2 das hbiles.  Para renovar recetas, por favor pida a su farmacia que se ponga en contacto con nuestra oficina. Randi lakes de fax es [place] [phone].  Si tiene un asunto urgente cuando la clnica est cerrada y que no puede esperar hasta el siguiente da hbil, puede llamar/localizar a su doctor(a) al nmero que aparece a continuacin.   Por favor, tenga en cuenta que aunque hacemos todo lo posible para estar disponibles para asuntos urgentes fuera del horario de Sophia, no estamos disponibles las 24 horas del da, los 7 [address]  [address] de la [place].   Si tiene un problema urgente y no puede comunicarse con nosotros, puede optar por buscar atencin mdica  en el consultorio de su doctor(a), en una clnica privada, en un centro de atencin urgente o en una sala de emergencias.  Si tiene Engineer, drilling, por favor llame inmediatamente al 911 o vaya a la sala de emergencias.  Nmeros de bper  - Dr. Hester: [phone]  - Dra. Jackquline: [phone]  - Dr. Claudene: [phone]   En caso de inclemencias del tiempo, por favor llame a landry capes principal al [phone] para una actualizacin sobre el [place] de cualquier retraso o cierre.  Consejos para la medicacin en dermatologa: Por favor, guarde las cajas en las que vienen los medicamentos de uso tpico para ayudarle a seguir las instrucciones sobre dnde y cmo usarlos. Las farmacias generalmente imprimen las instrucciones del medicamento slo en las cajas y no directamente en los tubos del [place].   Si su medicamento es muy caro, por favor, pngase en contacto con landry rieger llamando al [phone] y presione la opcin 4 o envenos un mensaje a travs de  Clinical cytogeneticist.   No podemos decirle cul ser su copago por los medicamentos por adelantado ya que esto es diferente dependiendo de la cobertura de su seguro. Sin embargo, es posible que podamos encontrar un medicamento sustituto a Audiological scientist un formulario para que el seguro cubra el medicamento que se considera necesario.   Si se requiere una autorizacin previa para que su compaa de seguros malta su medicamento, por favor permtanos de 1 a 2 das hbiles para completar este proceso.  Los precios de los medicamentos varan con frecuencia dependiendo del Environmental consultant de dnde se surte la receta y alguna farmacias pueden ofrecer precios ms baratos.  El sitio web www.goodrx.com tiene cupones para medicamentos de Health and safety inspector. Los precios aqu no tienen en cuenta lo que podra costar con la ayuda del seguro (puede ser ms barato con su seguro), pero el sitio web puede darle el precio si no utiliz Tourist information centre manager.  -  Puede imprimir el cupn correspondiente y llevarlo con su receta a la farmacia.  - Tambin puede pasar por nuestra oficina durante el horario de atencin regular y Education officer, museum una tarjeta de cupones de GoodRx.  - Si necesita que su receta se enve electrnicamente a una farmacia diferente, informe a nuestra oficina a travs de MyChart de [place] o por telfono llamando al [phone] y presione la opcin 4.

## 2024-07-14 NOTE — Progress Notes (Signed)
   Follow-Up Visit   Subjective  Cindy Stein is a 68 y.o. female who presents for the following:  Patient is here with daughter concerning rash that she developed at lips 3 months ago. Reports believes it may be related to eating a lot of tomatoes but unsure.  Patient is also here today for botox follow up.   The following portions of the chart were reviewed this encounter and updated as appropriate: medications, allergies, medical history  Review of Systems:  No other skin or systemic complaints except as noted in HPI or Assessment and Plan.  Objective  Well appearing patient in no apparent distress; mood and affect are within normal limits.  A focused examination was performed of the following areas: Face and lips  Relevant exam findings are noted in the Assessment and Plan.    Assessment & Plan   Angular Chelitis with Fissures Hx of rash at lips for 3 months  Could be related to eating tomatoes Not using any topical retinoid Exam: fissures at oral commissures   Treatment Plan: Samples given of Eucrisa  2 % ointment apply topically to affected areas daily as needed Samples given of Opzelura cream - apply topically to affected areas daily as needed Discussed could use until she got rx at pharmacy  Start tacrolimus  0.1 % ointment - apply topically to affected qd/bid prn for rash   Facial Elastosis  65.5 total units of botox injected today  Frown Complex 37.5 units  Crow's Feet - 10 units x 2  Forehead 1.25 units x 4 DOA's 4 units each side   Location: face  Informed consent: Discussed risks (infection, pain, bleeding, bruising, swelling, allergic reaction, paralysis of nearby muscles, eyelid droop, double vision, neck weakness, difficulty breathing, headache, undesirable cosmetic result, and need for additional treatment) and benefits of the procedure, as well as the alternatives.  Informed consent was obtained.  Preparation: The area was cleansed with  alcohol.  Procedure Details:  Botox was injected into the dermis with a 30-gauge needle. Pressure applied to any bleeding. Ice packs offered for swelling.  Lot Number:  [id number] Expiration:  04/2026  Total Units Injected:  65.5  Plan: Tylenol  may be used for headache.  Allow 2 weeks before returning to clinic for additional dosing as needed. Patient will call for any problems.  ATOPIC DERMATITIS/hand dermatitis  Exam: Peeling of the hands 4% BSA Chronic and persistent condition with duration or expected duration over one year. Condition is symptomatic/ bothersome to patient. Not currently at goal. Atopic dermatitis (eczema) is a chronic, relapsing, pruritic condition that can significantly affect quality of life. It is often associated with allergic rhinitis and/or asthma and can require treatment with topical medications, phototherapy, or in severe cases biologic injectable medication (Dupixent; Adbry) or Oral JAK inhibitors. Treatment Plan: Can use samples of Opzelura or Eucrisa  given today  Continue Eucrisa  2% ointment BID. Continue  Mometasone  0.1% cream to aa's QD-BID PRN up to 3d/wk.    Alternatively may also use Tacrolimus  oint (same Rx as for lips) for hands.  Recommend gentle skin care.  ANGULAR CHEILITIS   Related Medications tacrolimus  (PROTOPIC ) 0.1 % ointment Apply thin layer topically to lips for angular cheilitis qd/bid prn  Return for 3 - 4 month botox .  IEleanor Blush, CMA, am acting as scribe for Alm Rhyme, MD.'  Documentation: I have reviewed the above documentation for accuracy and completeness, and I agree with the above.  Alm Rhyme, MD

## 2024-07-15 ENCOUNTER — Telehealth: Payer: Self-pay

## 2024-07-15 ENCOUNTER — Encounter: Payer: Self-pay | Admitting: Dermatology

## 2024-07-15 NOTE — Telephone Encounter (Signed)
 Called patient to see if they were able to get rx for tacrolimus  at an affordable price that was prescribed yesterday for angular cheilitis. Patient states that medication was $58 dollars and could not afford. Patient states advised her daughter not to get medication.  Discussed with Dr. Hester, he recommended to continue with Eucrisa  and Opzelura samples given. If patient likes either sample to reach out to us  and will submit rx in.   Patient was very appreciative and will contact us  if she would like rx of either Eucrisa  or Opzelura sent in.

## 2024-07-19 ENCOUNTER — Ambulatory Visit (INDEPENDENT_AMBULATORY_CARE_PROVIDER_SITE_OTHER): Payer: Self-pay | Admitting: Nurse Practitioner

## 2024-07-19 ENCOUNTER — Ambulatory Visit: Payer: Medicare Other

## 2024-07-19 VITALS — BP 118/78 | HR 62 | Temp 97.7°F | Ht 62.0 in | Wt 206.0 lb

## 2024-07-19 DIAGNOSIS — F419 Anxiety disorder, unspecified: Secondary | ICD-10-CM

## 2024-07-19 DIAGNOSIS — R7303 Prediabetes: Secondary | ICD-10-CM

## 2024-07-19 DIAGNOSIS — E66812 Obesity, class 2: Secondary | ICD-10-CM

## 2024-07-19 DIAGNOSIS — F32A Depression, unspecified: Secondary | ICD-10-CM

## 2024-07-19 DIAGNOSIS — M069 Rheumatoid arthritis, unspecified: Secondary | ICD-10-CM

## 2024-07-19 DIAGNOSIS — M5442 Lumbago with sciatica, left side: Secondary | ICD-10-CM

## 2024-07-19 DIAGNOSIS — G8929 Other chronic pain: Secondary | ICD-10-CM

## 2024-07-19 DIAGNOSIS — Z6837 Body mass index (BMI) 37.0-37.9, adult: Secondary | ICD-10-CM

## 2024-07-19 DIAGNOSIS — I10 Essential (primary) hypertension: Secondary | ICD-10-CM

## 2024-07-19 MED ORDER — CYCLOBENZAPRINE HCL 5 MG PO TABS: 5.0000 mg | ORAL_TABLET | Freq: Every evening | ORAL | 2 refills | Status: AC | PRN

## 2024-07-19 NOTE — Progress Notes (Signed)
 Leron Glance, NP-C Phone: [phone]  Cindy Stein is a 68 y.o. female who presents today for follow up.   Discussed the use of AI scribe software for clinical note transcription with the patient, who gave verbal consent to proceed.  History of Present Illness   Cindy Stein is a 68 year old female with rheumatoid arthritis who presents for a follow-up visit.  She has discontinued Enbrel for rheumatoid arthritis due to lack of efficacy and side effects, including hair loss. She is currently using red light therapy daily for 20 minutes, which she feels has improved her condition. She is not taking any medications for rheumatoid arthritis and has communicated with her rheumatologist about changing her medication.  She is taking sertraline  once daily for anxiety and feels stable without symptoms of depression or nervousness. She is not taking Plaquenil or any other medications for rheumatoid arthritis.  She is on blood pressure medications, including Benicar  (olmesartan ) and carvedilol , which she takes twice daily. Her home blood pressure readings are around 130/70 mmHg, and her oxygen saturation is 97-98%. No chest pain or shortness of breath, but she experiences dizziness in humid conditions.  She has difficulty losing weight due to foot problems and knee pain. She is interested in weight loss medications but is currently without insurance coverage for prescriptions.  She experiences low back pain, particularly on the left side, which sometimes radiates down her leg. The pain is severe, especially when lying down, and is relieved by ice compresses. She has a history of chiropractic treatment, which was beneficial, but is currently unable to pursue it due to insurance issues.  She reports a recent episode of persistent cough lasting three weeks, which was alleviated by benzonatate . Her husband experienced similar symptoms and was evaluated in the emergency room, where tests for various  viruses were negative.      Social History   Tobacco Use  Smoking Status Never  Smokeless Tobacco Never    Current Outpatient Medications on File Prior to Visit  Medication Sig Dispense Refill   amoxicillin -clavulanate (AUGMENTIN ) 875-125 MG tablet Take 1 tablet by mouth 2 (two) times daily. 20 tablet 0   apixaban  (ELIQUIS ) 5 MG TABS tablet Take 1 tablet (5 mg total) by mouth 2 (two) times daily. 180 tablet 3   carvedilol  (COREG ) 3.125 MG tablet Take 1 tablet (3.125 mg total) by mouth 2 (two) times daily. 180 tablet 3   Crisaborole  (EUCRISA ) 2 % OINT Apply to hands BID PRN. 60 g 1   dexamethasone  0.5 MG/5ML elixir Take 5 mLs (0.5 mg total) by mouth daily. Swish and spit 5 mL once in the morning and once at night. 100 mL 0   etanercept (ENBREL) 50 MG/ML injection Inject into the skin.     folic acid  (FOLVITE ) 1 MG tablet Take 1 mg by mouth daily.     furosemide  (LASIX ) 20 MG tablet Take 1 tablet (20 mg total) by mouth daily. 30 tablet 0   hydroxychloroquine (PLAQUENIL) 200 MG tablet TAKE ONE TABLET (200 MG TOTAL) BY MOUTH TWO TIMES DAILY     minoxidil  (LONITEN ) 2.5 MG tablet Take 1/2 tab po QD. 45 tablet 1   mometasone  (ELOCON ) 0.1 % cream Apply to aa's QD-BID PRN. 45 g 1   naproxen sodium (ALEVE) 220 MG tablet Take 220 mg by mouth.     olmesartan  (BENICAR ) 20 MG tablet Take 1 tablet (20 mg total) by mouth daily. 90 tablet 3   ondansetron  (ZOFRAN -ODT) 4 MG disintegrating tablet TAKE  ONE TABLET (4 MG TOTAL) BY MOUTH EVERY EIGHT HOURS AS NEEDED FOR NAUSEA OR VOMITING. 15 tablet 0   sertraline  (ZOLOFT ) 50 MG tablet Take 1 tablet (50 mg total) by mouth daily. 90 tablet 3   sucralfate  (CARAFATE ) 1 g tablet TAKE ONE TABLET (1 GRAM TOTAL) BY MOUTH FOUR (FOUR) TIMES DAILY - WITH MEALS AND AT BEDTIME. 30 tablet 1   tacrolimus  (PROTOPIC ) 0.1 % ointment Apply thin layer topically to lips for angular cheilitis qd/bid prn 30 g 3   No current facility-administered medications on file prior to visit.      ROS see history of present illness  Objective  Physical Exam Vitals:   07/19/24 1007  BP: 118/78  Pulse: 62  Temp: 97.7 F (36.5 C)  SpO2: 98%    BP Readings from Last 3 Encounters:  07/19/24 118/78  06/03/24 122/80  04/08/24 112/78   Wt Readings from Last 3 Encounters:  07/19/24 206 lb (93.4 kg)  06/03/24 207 lb 6.4 oz (94.1 kg)  04/08/24 206 lb 9.6 oz (93.7 kg)    Physical Exam Constitutional:      General: She is not in acute distress.    Appearance: Normal appearance.  HENT:     Head: Normocephalic.  Cardiovascular:     Rate and Rhythm: Normal rate and regular rhythm.     Heart sounds: Normal heart sounds.  Pulmonary:     Effort: Pulmonary effort is normal.     Breath sounds: Normal breath sounds.  Skin:    General: Skin is warm and dry.  Neurological:     General: No focal deficit present.     Mental Status: She is alert.  Psychiatric:        Mood and Affect: Mood normal.        Behavior: Behavior normal.      Assessment/Plan: Please see individual problem list.  Class 2 severe obesity due to excess calories with serious comorbidity and body mass index (BMI) of 37.0 to 37.9 in adult [facility]) Assessment & Plan: Complicated by mobility issues. She is interested in weight loss medications but lacks insurance coverage. Discuss weight loss injections like Wegovy once insurance is resolved. A low-carb, high-protein diet and caloric deficit were discussed. Emphasize lifestyle changes for long-term weight maintenance.    Chronic left-sided low back pain with left-sided sciatica Assessment & Plan: Likely due to nerve impingement. Previous chiropractic care was beneficial, but insurance issues limit access. Prescribe Flexeril  as needed, especially at bedtime. Advise alternating ice and heat therapy for 20 minutes each. Discuss physical therapy options once insurance is resolved.  Orders: -     Cyclobenzaprine  HCl; Take 1 tablet (5 mg total) by mouth at  bedtime as needed.  Dispense: 30 tablet; Refill: 2  Rheumatoid arthritis involving both hands, unspecified whether rheumatoid factor present [facility]) Assessment & Plan: Management is challenging due to the discontinuation of Enbrel, though she reports benefits from red light therapy. Continue red light therapy. Follow up with Rheumatology as scheduled.    Anxiety and depression Assessment & Plan: Anxiety and depression is well managed with Zoloft  50 mg daily. Continue. Encouraged to contact if worsening symptoms, unusual behavior changes or suicidal thoughts occur.    Primary hypertension Assessment & Plan: Blood pressure is controlled with carvedilol  6.25 mg twice daily and Benicar  20 mg daily. Home readings are consistent with office measurements. Continue current antihypertensive regimen and home blood pressure monitoring.   Prediabetes Assessment & Plan: Home blood sugar readings are around  82 mg/dL. Continue monitoring blood sugar levels at home and advise dietary modifications to manage prediabetes.      Return in about 3 months (around 10/19/2024) for Follow up.   Leron Glance, NP-C [facility]

## 2024-07-29 ENCOUNTER — Encounter: Payer: Self-pay | Admitting: Nurse Practitioner

## 2024-07-29 NOTE — Assessment & Plan Note (Signed)
 Home blood sugar readings are around 82 mg/dL. Continue monitoring blood sugar levels at home and advise dietary modifications to manage prediabetes.

## 2024-07-29 NOTE — Assessment & Plan Note (Signed)
 Anxiety and depression is well managed with Zoloft  50 mg daily. Continue. Encouraged to contact if worsening symptoms, unusual behavior changes or suicidal thoughts occur.

## 2024-07-29 NOTE — Assessment & Plan Note (Signed)
 Blood pressure is controlled with carvedilol  6.25 mg twice daily and Benicar  20 mg daily. Home readings are consistent with office measurements. Continue current antihypertensive regimen and home blood pressure monitoring.

## 2024-07-29 NOTE — Assessment & Plan Note (Signed)
 Complicated by mobility issues. She is interested in weight loss medications but lacks insurance coverage. Discuss weight loss injections like Wegovy once insurance is resolved. A low-carb, high-protein diet and caloric deficit were discussed. Emphasize lifestyle changes for long-term weight maintenance.

## 2024-07-29 NOTE — Assessment & Plan Note (Signed)
 Likely due to nerve impingement. Previous chiropractic care was beneficial, but insurance issues limit access. Prescribe Flexeril  as needed, especially at bedtime. Advise alternating ice and heat therapy for 20 minutes each. Discuss physical therapy options once insurance is resolved.

## 2024-07-29 NOTE — Assessment & Plan Note (Signed)
 Management is challenging due to the discontinuation of Enbrel, though she reports benefits from red light therapy. Continue red light therapy. Follow up with Rheumatology as scheduled.

## 2024-08-11 ENCOUNTER — Telehealth: Payer: Self-pay

## 2024-08-11 NOTE — Telephone Encounter (Signed)
 Sent daughter MyChart message.   One of the most common causes of inflamed irritated lips is LIP LICKING.  Pt should be conscientious about whether she is licking her lips regularly to cause dryness and irritation.  The medications prescribed are the most common things that might be helpful, but if the underlying cuasue such as LIP LICKING persists, it will not help long term. Other less likely possibilities for Cheilitis (inflamed lips) is Contact Dermatitis due to chemicals or irritants on skin /lips.  One test for Allergy to chemicals such as chemicals that might be in lip balms or make up or even gum or toothpaste is PATCH TESTING. If she would want to consider doing patch testing, we can schedule for that -- explain it is 3 visits etc. Even less likely, there are other things that can cause dry mouth and lips like Sjogren's disease which is an auto-immune disease that may have other symptoms such as fatigue.  We can refer to Rheumatology or ENT for evaluation for this possibility if pt would like -- BUT we should do PATCH TESTING first before referring.   There are other options for treatment too, such as Vtama cream (samples may be given), but I doubt if other things not satisfactory, do not feel Vtama would necessarily be helpful either-- but can try.

## 2024-08-11 NOTE — Telephone Encounter (Signed)
 Patient's daughter called and reported the Tacrolimus  Ointment did not help treat the cheilitis. She also states she used the samples of Eucrisa  and Opzelura given.   The medications would help soothe the areas but never fully treated. She also states once medication wore off/dried up - the issue would come right back.  Please advise.

## 2024-09-17 ENCOUNTER — Telehealth: Payer: Self-pay

## 2024-09-17 NOTE — Telephone Encounter (Signed)
 Form faxed to [phone]

## 2024-09-17 NOTE — Telephone Encounter (Signed)
 Chronic condition verification form placed in provider to be signed folder

## 2024-10-04 ENCOUNTER — Other Ambulatory Visit: Payer: Self-pay | Admitting: Dermatology

## 2024-10-04 DIAGNOSIS — L650 Telogen effluvium: Secondary | ICD-10-CM

## 2024-10-21 ENCOUNTER — Ambulatory Visit
Admission: RE | Admit: 2024-10-21 | Discharge: 2024-10-21 | Disposition: A | Source: Ambulatory Visit | Attending: Nurse Practitioner

## 2024-10-21 ENCOUNTER — Encounter: Payer: Self-pay | Admitting: Nurse Practitioner

## 2024-10-21 ENCOUNTER — Ambulatory Visit
Admission: RE | Admit: 2024-10-21 | Discharge: 2024-10-21 | Disposition: A | Discharge status: Home / Self Care | Source: Ambulatory Visit | Attending: Nurse Practitioner | Admitting: Nurse Practitioner

## 2024-10-21 ENCOUNTER — Ambulatory Visit (INDEPENDENT_AMBULATORY_CARE_PROVIDER_SITE_OTHER): Admitting: Nurse Practitioner

## 2024-10-21 VITALS — BP 132/80 | HR 63 | Temp 98.6°F | Ht 62.0 in | Wt 211.4 lb

## 2024-10-21 DIAGNOSIS — M5442 Lumbago with sciatica, left side: Secondary | ICD-10-CM | POA: Diagnosis present

## 2024-10-21 DIAGNOSIS — I10 Essential (primary) hypertension: Secondary | ICD-10-CM

## 2024-10-21 DIAGNOSIS — G8929 Other chronic pain: Secondary | ICD-10-CM | POA: Insufficient documentation

## 2024-10-21 DIAGNOSIS — M5416 Radiculopathy, lumbar region: Secondary | ICD-10-CM

## 2024-10-21 DIAGNOSIS — Z6838 Body mass index (BMI) 38.0-38.9, adult: Secondary | ICD-10-CM

## 2024-10-21 DIAGNOSIS — M069 Rheumatoid arthritis, unspecified: Secondary | ICD-10-CM

## 2024-10-21 NOTE — Progress Notes (Unsigned)
 Leron Glance, NP-C Phone: [phone]  Cindy Stein is a 68 y.o. female who presents today for follow up.   Discussed the use of AI scribe software for clinical note transcription with the patient, who gave verbal consent to proceed.  History of Present Illness   Cindy Stein is a 68 year old female with lumbar radiculopathy and rheumatoid arthritis who presents with back pain and medication management issues.  She experiences significant back pain radiating from her hip down her leg, worsening at night and when lying on her side. The pain is described as feeling like 'somebody cut me the bones all over.' Gabapentin and a muscle relaxer provide some relief, but the pain persists. She reports that her rheumatologist called her back pain 'lumbar radiculopathy.' She has previously found relief from chiropractic care.  She has rheumatoid arthritis and has been on various medications including Enbrel, which was ineffective. Recently, she has been taking Rinvoq, which significantly improved her symptoms, including stiffness and hair loss. However, her rheumatologist is hesitant to prescribe it due to concerns about blood clots and her use of blood thinners. Currently, she is on Plaquenil and methotrexate, which she feels are ineffective.  She has a history of kidney stones, confirmed by a past CT scan. She experiences intermittent severe pain, described as worse than childbirth, managed with anti-inflammatory medications like ibuprofen . Drinking beer and cranberry juice helps alleviate symptoms. No recent imaging has been done to assess her current kidney stone status.  She reports oral discomfort, particularly after consuming acidic foods like tomato sauce and orange juice. She has used a medication prescribed to her daughter, which provides some relief, but she continues to experience pain and discomfort.  She is on blood pressure medications, including Benicar  and Coreg , and reports stable  blood pressure readings. She also takes sertraline  for mood and anxiety, which she feels helps her manage stress effectively.  She experiences shortness of breath on exertion, which she attributes to her weight and possibly her medication regimen. She is concerned about her ability to lose weight. No chest pain or shortness of breath at rest. No blood in her urine and reports clear urine.      Social History   Tobacco Use  Smoking Status Never  Smokeless Tobacco Never    Current Outpatient Medications on File Prior to Visit  Medication Sig Dispense Refill  . apixaban  (ELIQUIS ) 5 MG TABS tablet Take 1 tablet (5 mg total) by mouth 2 (two) times daily. 180 tablet 3  . carvedilol  (COREG ) 3.125 MG tablet Take 1 tablet (3.125 mg total) by mouth 2 (two) times daily. 180 tablet 3  . Crisaborole  (EUCRISA ) 2 % OINT Apply to hands BID PRN. 60 g 1  . cyclobenzaprine  (FLEXERIL ) 5 MG tablet Take 1 tablet (5 mg total) by mouth at bedtime as needed. 30 tablet 2  . etanercept (ENBREL) 50 MG/ML injection Inject into the skin.    . folic acid  (FOLVITE ) 1 MG tablet Take 1 mg by mouth daily.    . hydroxychloroquine (PLAQUENIL) 200 MG tablet TAKE ONE TABLET (200 MG TOTAL) BY MOUTH TWO TIMES DAILY    . minoxidil  (LONITEN ) 2.5 MG tablet TAKE ONE HALF (1/2) TAB BY MOUTH EVERY DAY 135 tablet 1  . naproxen sodium (ALEVE) 220 MG tablet Take 220 mg by mouth.    . olmesartan  (BENICAR ) 20 MG tablet Take 1 tablet (20 mg total) by mouth daily. 90 tablet 3  . ondansetron  (ZOFRAN -ODT) 4 MG disintegrating tablet TAKE ONE TABLET (  4 MG TOTAL) BY MOUTH EVERY EIGHT HOURS AS NEEDED FOR NAUSEA OR VOMITING. 15 tablet 0  . sertraline  (ZOLOFT ) 50 MG tablet Take 1 tablet (50 mg total) by mouth daily. 90 tablet 3  . sucralfate  (CARAFATE ) 1 g tablet TAKE ONE TABLET (1 GRAM TOTAL) BY MOUTH FOUR (FOUR) TIMES DAILY - WITH MEALS AND AT BEDTIME. 30 tablet 1  . tacrolimus  (PROTOPIC ) 0.1 % ointment Apply thin layer topically to lips for  angular cheilitis qd/bid prn 30 g 3   No current facility-administered medications on file prior to visit.     ROS see history of present illness  Objective  Physical Exam Vitals:   10/21/24 0844  BP: 132/80  Pulse: 63  Temp: 98.6 F (37 C)  SpO2: 98%    BP Readings from Last 3 Encounters:  10/21/24 132/80  07/19/24 118/78  06/03/24 122/80   Wt Readings from Last 3 Encounters:  10/21/24 211 lb 6.4 oz (95.9 kg)  07/19/24 206 lb (93.4 kg)  06/03/24 207 lb 6.4 oz (94.1 kg)    Physical Exam   Assessment/Plan: Please see individual problem list.  Assessment and Plan    Lumbar radiculopathy   Chronic lumbar radiculopathy causes nocturnal leg pain. Gabapentin and a muscle relaxer provide partial relief. An x-ray of the back has been ordered at outpatient imaging. Continue gabapentin and muscle relaxer. Consider a back injection post-surgery if symptoms persist. Referral to a back specialist may be necessary if symptoms continue after surgery.  Rheumatoid arthritis   Rheumatoid arthritis is managed with Plaquenil and methotrexate. There was no improvement with Enbrel. She prefers Rinvoq but was advised against it due to blood thinner use. Improvement was noted with Rinvoq, but there is concern about hair loss. Continue Plaquenil and methotrexate. Discuss with a rheumatologist the potential switch to Rinvoq post-surgery.  Nephrolithiasis (kidney stone)   Intermittent kidney stone pain is managed with pain medication. She currently has a kidney stone as detected by prior imaging. Continue current pain management as needed.  Hypertension   Hypertension is well-controlled with the current medication regimen. Blood pressure readings are stable. Continue current antihypertensive medications, Benicar  and Coreg .  Depression   Depression is managed with sertraline , with reported improvement in mood and stress management. Continue sertraline .  Obesity   Obesity management was  discussed with potential use of Wegovy for weight loss. Insurance coverage needs confirmation. A prescription for Georjean has been submitted, and insurance coverage is being checked.  Colitis   Colitis symptoms are managed with dietary modifications, with some improvement reported. Continue dietary modifications to manage symptoms.       Left lumbar radiculopathy -     DG Lumbar Spine 2-3 Views; Future      Return in about 3 months (around 01/21/2025) for Follow up.   Leron Glance, NP-C [facility]

## 2024-10-27 ENCOUNTER — Ambulatory Visit: Payer: Self-pay | Admitting: Nurse Practitioner

## 2024-10-27 NOTE — Telephone Encounter (Signed)
Results was given

## 2024-11-02 ENCOUNTER — Telehealth: Payer: Self-pay

## 2024-11-02 NOTE — Telephone Encounter (Signed)
 Medication Management for Surgery form placed in provider folder    Surgery is on 11-10-24

## 2024-11-04 NOTE — Telephone Encounter (Unsigned)
 Copied from CRM [id number]. Topic: General - Other >> Nov 04, 2024  8:22 AM Amber H wrote: Reason for CRM: Silvano from the [facility] is calling to see if provider could expedite the Medication Management form in patient chart due to patient having surgery on 12/10 and currently taking apixaban  (ELIQUIS ) 5 MG TABS tablet. She stated patient had to be off medication for 5 days prior to the surgery.    [phone]

## 2024-11-04 NOTE — Telephone Encounter (Signed)
 Form faxed to [phone]

## 2024-11-12 ENCOUNTER — Other Ambulatory Visit: Payer: Self-pay | Admitting: Nurse Practitioner

## 2024-11-12 ENCOUNTER — Encounter: Payer: Self-pay | Admitting: Nurse Practitioner

## 2024-11-12 ENCOUNTER — Other Ambulatory Visit (HOSPITAL_COMMUNITY): Payer: Self-pay

## 2024-11-12 ENCOUNTER — Telehealth: Payer: Self-pay

## 2024-11-12 DIAGNOSIS — E66812 Obesity, class 2: Secondary | ICD-10-CM

## 2024-11-12 MED ORDER — WEGOVY 0.25 MG/0.5ML ~~LOC~~ SOAJ: 0.2500 mg | SUBCUTANEOUS | 0 refills | Status: DC

## 2024-11-12 MED ORDER — WEGOVY 0.5 MG/0.5ML ~~LOC~~ SOAJ: 0.5000 mg | SUBCUTANEOUS | 1 refills | Status: DC

## 2024-11-12 NOTE — Assessment & Plan Note (Signed)
 Chronic lumbar radiculopathy causes nocturnal leg pain. Previous chiropractic care was beneficial, but insurance issues limit access. Gabapentin and a muscle relaxer provide partial relief. An x-ray of the back has been ordered at outpatient imaging. Continue gabapentin and muscle relaxer. Consider a back injection post-surgery if symptoms persist. Referral to a back specialist may be necessary if symptoms continue after surgery.

## 2024-11-12 NOTE — Assessment & Plan Note (Signed)
 Obesity management was discussed with potential use of Wegovy for weight loss. Insurance coverage needs confirmation. Complicated by mobility issues. A low-carb, high-protein diet and caloric deficit were discussed. Emphasize lifestyle changes for long-term weight maintenance.

## 2024-11-12 NOTE — Assessment & Plan Note (Signed)
 Blood pressure is controlled with carvedilol  6.25 mg twice daily and Benicar  20 mg daily. Home readings are consistent with office measurements. Continue current antihypertensive regimen and home blood pressure monitoring.

## 2024-11-12 NOTE — Assessment & Plan Note (Signed)
 Rheumatoid arthritis is managed with Plaquenil and methotrexate. There was no improvement with Enbrel. She prefers Rinvoq but was advised against it due to blood thinner use. Continue Plaquenil and methotrexate. Follow up with Rheumatology.

## 2024-11-12 NOTE — Telephone Encounter (Signed)
 PA needed for [facility].

## 2024-11-12 NOTE — Telephone Encounter (Signed)
 PA request has been Started.

## 2024-11-12 NOTE — Assessment & Plan Note (Addendum)
 Anxiety and depression is well managed with Zoloft  50 mg daily, with reported improvement in mood and stress management. Continue. Encouraged to contact if worsening symptoms, unusual behavior changes or suicidal thoughts occur.

## 2024-11-15 ENCOUNTER — Telehealth: Payer: Self-pay | Admitting: Pharmacist

## 2024-11-15 ENCOUNTER — Telehealth: Payer: Self-pay

## 2024-11-15 NOTE — Telephone Encounter (Signed)
 Pharmacy Patient Advocate Encounter   Received notification from Physician's Office that prior authorization for Wegovy  0.25 mg/0.5 ml  is required/requested.   Insurance verification completed.   The patient is insured through [facility].   Per test claim: PA required; PA submitted to above mentioned insurance via Latent Key/confirmation #/EOC [id number] Status is pending

## 2024-11-15 NOTE — Telephone Encounter (Signed)
 Unfortunately, after a thorough review of the patients chart and the insurance denial criteria, the medication will not qualify for coverage. The denial specifies that authorization requires submitted medical records documenting established cardiovascular disease, evidenced by one of the following: prior myocardial infarction, prior stroke, or peripheral arterial disease (including intermittent claudication with an ankle-brachial index less than 0.85, prior peripheral arterial revascularization, or amputation due to atherosclerotic disease). Based on the available documentation, the patient does not meet these criteria; therefore, an appeal for Wegovy  would not be successful.  Thank you, Devere Pandy, PharmD Clinical Pharmacist  [place]  Direct Dial: [phone]

## 2024-11-15 NOTE — Telephone Encounter (Signed)
 Pharmacy Patient Advocate Encounter  Received notification from [employer] that Prior Authorization for Wegovy  0.25 mg/0.5 ml has been DENIED.  Full denial letter will be uploaded to the media tab. See denial reason below.   PA #/Case ID/Reference #: [id number]  Submitted for appeal!

## 2024-11-16 ENCOUNTER — Other Ambulatory Visit (HOSPITAL_COMMUNITY): Payer: Self-pay

## 2024-12-06 ENCOUNTER — Telehealth: Payer: Self-pay | Admitting: Cardiovascular Disease

## 2024-12-06 NOTE — Telephone Encounter (Signed)
 Pt c/o Shortness Of Breath: STAT if SOB developed within the last 24 hours or pt is noticeably SOB on the phone  1. Are you currently SOB (can you hear that pt is SOB on the phone)? no  2. How long have you been experiencing SOB? Since before christmas   3. Are you SOB when sitting or when up moving around? When she is moving around  4. Are you currently experiencing any other symptoms? Feels tired when she does things around the house.

## 2024-12-06 NOTE — Telephone Encounter (Signed)
 Pt reports SOB, low energy, tired all the time, SOB worse after 12/25 but had started before this  Pt reports hx of blood clot x 2 (once PE in both lung, second one pt doesn't remember but lost vision); taking Eliquis  5 mg BID as directed, denies recent swelling or wt gain  Pt reports very bad HA on 12/31 and BP 200/120 - pt took 4 Aleve - counseled on only taking 2 max  At this time BP 149/80, HR 97, Pulse Ox 96%  Pt refuses ED for CT scan, given appt 1/9 at 0800, has appt on 1/8 in [place], [place] requested  ED precautions given

## 2024-12-08 NOTE — Progress Notes (Signed)
 "  Cardiology Clinic Note   Date: 12/10/2024 ID: Cindy Stein, DOB [birth year], MRN [id number]  Primary Cardiologist:  Deatrice Cage, MD  Chief Complaint   Cindy Stein is a 69 y.o. female who presents to the clinic today for evaluation of shortness of breath.   Patient Profile   Cindy Stein is followed by Dr. Cage for the history outlined below.       Past medical history significant for: Palpitations/PVCs. 7-day ZIO 07/22/2019: NSR with average HR 73 bpm. 5 beat run of SVT. Rare PACs. No PVCs.  Hypertension. Hyperlipidemia. DVT/PE. Left lower extremity venous ultrasound 03/20/2020: Nonocclusive DVT in left femoral, popliteal, posterior tibial and peroneal veins. CTA chest PE protocol 03/21/2020: Bilateral pulmonary arterial emboli, left greater than right. RV to LV ratio of 1.4, no pericardial effusion. Large volume of pulmonary emboli in the lungs bilaterally, with dilatation of the right atrium and right ventricle (RV to LV ratio of 1.4) indicative of elevated right-sided heart pressures and right heart strain. These findings have been shown to be associated with a increased morbidity and mortality in the setting pulmonary embolism. Thrombolysis bilateral pulmonary arteries, mechanical thrombectomy bilateral pulmonary arteries 03/22/2020. IVC filter insertion 12/11/2020 in the setting of knee replacement. IVC filter removal 04/19/2021. CTA chest PE protocol 08/12/2023: Pulmonary embolus seen in lower lobe branch of right pulmonary artery. Although RV/LV ratio is elevated at 1.25, this embolus appears to be too peripheral to result in right heart strain. Echo 08/13/2023: EF 60 to 65%.  No RWMA.  Grade I DD.  Normal RV function.  Mild RVH.  Mild MR/AI.  Aortic valve sclerosis without stenosis. Bilateral lower extremity venous ultrasound 08/13/2023: No evidence of DVT. CTA chest PE protocol 01/07/2024: No evidence of PE.  Mild diffuse patchy groundglass opacities throughout both lungs,  nonspecific but can be seen in the setting of atypical infection or pulmonary edema.  Small hiatal hernia. Dyspnea. Echo 02/05/2024: EF 60 to 65%.  No RWMA.  Grade I DD.  Normal global strain.  Normal RV size/function.  Aortic valve sclerosis without stenosis. RA.  In summary, patient has a history of atypical chest pain. She underwent LHC in July 2020 which showed normal coronary arteries, normal LV function, and mildly elevated LVEDP.  7 day Zio showed 1 run of SVT as detailed above. She has a history of PE/DVT. She had an IVC filter placed in January 2022 for knee replacement and removal in May 2022. She had atypical chest pain in 2022 with a normal nuclear stress test. Echo at that time showed normal LV function.   Patient was last seen in the office by Dr. Cage on 04/24/2023 for routine follow up. She was doing well at that time with no cardiac complaints. BP was controlled. No medication changes made.   Hospital admission 08/12/2023 to 08/13/2023 for hypotension and found to have PE. Echo at that time showed normal LV function as detailed above. She was discharged on Eliquis .   Patient was evaluated by PCP on 01/06/2024 for left lower chest discomfort while walking and worsened with deep breathing lasting 2 hours. She noted her BP was 80/60 at home. BP at the time of her visit was 130/84. She underwent CTA and was negative for PE as detailed above.   Patient contacted the office on 2/10 with complaints of low BP. Per triage RN: Called patient, she states she has been having some lower blood pressure readings. She became very nauseous and sweating. She went to ED but was  not able to stay, she went home- did not take her medications the last few days. Blood pressure was 137/84 HR 84. She states she does not want to continue off her medication, but feels it makes her feel bad. She requested an appointment to come in to speak to someone about her blood pressure concerns.   Patient was seen in the office  on 01/12/2024 for evaluation of hypotension. She reported 2 episodes of low BP with readings as low as 80/40 and HR 120. She reported taking carvedilol  at 8am and 12pm. At the time of her visit she had held both carvedilol  and olmesartan  the day prior and the day of. She also reported an episode of palpitations in the evening. She had recently been started on Lasix  the week prior by her PCP for concern of fluid in her lungs per CTA. Carvedilol  was reduced to 3.125 mg bid and patient was instructed to space doses out to morning and evening. She was instructed to continue to hold olmesartan  and restart if BP consistently >130/80.  An echo was ordered which demonstrated normal LV/RV function.  Upon follow-up in April she was doing well without further episodes of dyspnea.  She was taking olmesartan  and carvedilol  at the time with well-controlled BP.  Patient contacted the office on 12/06/2024 with complaints of a several week history of DOE and fatigue.  She spoke with the triage RN who encouraged patient to present to the ED for further evaluation of dyspnea given history of PE.  Patient declined favoring an in office visit.  Patient also mentioned having a very bad headache on 12/31 with BP 200/120.  She reported taking 4 Aleve tablets and was counseled on taking only 2 tablets at a time.     History of Present Illness    Today, patient is accompanied by her daughter. She reports increased DOE for the last 2 months. Patient states she began noticing that she becomes dyspneic when walking down her driveway needing to take several breaks to catch her breath. She also describes bendopnea. Dyspnea will occasionally be accompanied by diaphoresis. Daughter and patient state she had similar symptoms with past PE. She denies chest pain, pressure or tightness. No lower extremity edema, orthopnea or PND. No palpitations, lightheadedness, dizziness, presyncope or syncope. She has no shortness of breath at rest. She states  everything else is fine. Nothing else is wrong.  She reports undergoing bunion surgery on 12/10. She held Eliquis  for 2 days prior and restarted it on the same day as surgery. Patient's daughter reports patient was not sedentary after surgery and was able to get up, walk around and perform normal household tasks post surgery. She reports an isolated incident of elevated BP to 200/120 accompanied by a very bad headache. She treated her headache with 5 aleve and it eventually resolved. She has not had another episode since. Stressed the dangers of overtaking medications. She verbalized understanding.     ROS: All other systems reviewed and are otherwise negative except as noted in History of Present Illness.  EKGs/Labs Reviewed    EKG Interpretation Date/Time:  Friday December 10 2024 08:05:48 EST Ventricular Rate:  66 PR Interval:  188 QRS Duration:  100 QT Interval:  396 QTC Calculation: 415 R Axis:   26  Text Interpretation: Normal sinus rhythm Nonspecific T wave abnormality When compared with ECG of 19-Aug-2023 14:23, No significant change was found Confirmed by Loistine Sober [phone]) on 12/10/2024 8:12:19 AM   04/01/2024: ALT 22; AST  18; BUN 20; Creatinine, Ser 0.71; Potassium 4.6; Sodium 137   04/01/2024: Hemoglobin 13.6; WBC 5.9   04/01/2024: TSH 2.48   01/12/2024: Brain Natriuretic Peptide 29    Physical Exam    VS:  BP 110/72 (BP Location: Left Arm, Patient Position: Sitting)   Pulse 66   Ht 5' 2 (1.575 m)   Wt 213 lb 12.8 oz (97 kg)   SpO2 98%   BMI 39.10 kg/m  , BMI Body mass index is 39.1 kg/m.  GEN: Well nourished, well developed, in no acute distress. Neck: No JVD or carotid bruits. Cardiac:  RRR.  No murmur. No rubs or gallops.   Respiratory:  Respirations regular and unlabored. Clear to auscultation without rales, wheezing or rhonchi. GI: Soft, nontender, nondistended. Extremities: Radials/DP/PT 2+ and equal bilaterally. No clubbing or cyanosis. No edema.  Dressing in place left foot.  Skin: Warm and dry, no rash. Neuro: Strength intact.  Assessment & Plan   Hypertension BP today 110/72. No lightheadedness or dizziness. She has one episode of elevated BP to 200/100 with headache. No episodes since.  - Continue to monitor BP at home.  - Continue olmesartan  and carvedilol .    DOE Echo March 2025 showed normal LV/RV function, Grade I DD, aortic valve sclerosis without stenosis.  Patient reports a 2 months history of increased DOE. She notes needing to take several rest breaks when walking down her driveway. She states as long as she goes at a slow pace she will not have dyspnea. No shortness of breath at rest. No lower extremity edema, orthopnea or PND. No chest pain. Denies palpitations, lightheadedness, dizziness, presyncope or syncope.  On occasion dyspnea is accompanied by diaphoresis. Patient and daughter report similar symptoms with past PE. Patient underwent bunion surgery on 12/10. She held Eliquis  for 2 days before and restarted it the same night of surgery. She returned to normal activities post surgery and did not have a period of time that she was sedentary. Recommended evaluation in ED for patient to undergo CTA. Patient is adamant that she will not go to the ED. Discussed that CTA of chest would be ordered as an outpatient and results would not be immediate. Patient and daughter verbalized understanding and desire to undergo test as an outpatient just to be sure. Lungs are clear to auscultation. Patient speaks in long, full sentences without dyspnea. O2 sat 98% on room air. HR 66 bpm. I have a low suspicion for PE. Strict ED precautions provided with understanding verbalized by both patient and daughter.  - CTA Chest.  - Refer to Dr. Tamea in pulmonology, per patient request.    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 She denies spontaneous bleeding concerns. See above.  - Continue Eliquis  as managed by  PCP. - CTA chest as above.   Disposition: BMP today. CTA chest. Return in 6 months or sooner as needed.          Signed, Barnie HERO. Nesanel Aguila, DNP, NP-C  "

## 2024-12-10 ENCOUNTER — Encounter: Payer: Self-pay | Admitting: Student

## 2024-12-10 ENCOUNTER — Ambulatory Visit: Attending: Student | Admitting: Student

## 2024-12-10 VITALS — BP 110/72 | HR 66 | Ht 62.0 in | Wt 213.8 lb

## 2024-12-10 DIAGNOSIS — Z86711 Personal history of pulmonary embolism: Secondary | ICD-10-CM | POA: Diagnosis not present

## 2024-12-10 DIAGNOSIS — Z86718 Personal history of other venous thrombosis and embolism: Secondary | ICD-10-CM | POA: Diagnosis not present

## 2024-12-10 DIAGNOSIS — I10 Essential (primary) hypertension: Secondary | ICD-10-CM | POA: Diagnosis not present

## 2024-12-10 DIAGNOSIS — R0609 Other forms of dyspnea: Secondary | ICD-10-CM

## 2024-12-10 NOTE — Patient Instructions (Signed)
 Medication Instructions:   Your physician recommends that you continue on your current medications as directed. Please refer to the Current Medication list given to you today.    *If you need a refill on your cardiac medications before your next appointment, please call your pharmacy*  Lab Work:  Your provider would like for you to have following labs drawn today [facility].    If you have labs (blood work) drawn today and your tests are completely normal, you will receive your results only by:  MyChart Message (if you have MyChart) OR  A paper copy in the mail If you have any lab test that is abnormal or we need to change your treatment, we will call you to review the results.  Testing/Procedures:  CT Angiography (CTA) chest/aorta, is a special type of CT scan that uses a computer to produce multi-dimensional views of major blood vessels throughout the body. In CT angiography, a contrast material is injected through an IV to help visualize the blood vessels  Nothing to eat or drink 4 hours prior to test  [facility] [address]. [address]  [place], [place] [address]   Referrals:  Your cardiologist has referred you to Dr. Tamea at [facility].  We have attached their office location and phone number below.  Please allow them 3-5 business days to reach out to you to make an appointment.  If you have not heard from their office within that time, please call them to schedule your appointment.    Follow-Up:  At [facility], you and your health needs are our priority.  As part of our continuing mission to provide you with exceptional heart care, our providers are all part of one team.  This team includes your primary Cardiologist (physician) and Advanced Practice Providers or APPs (Physician Assistants and Nurse Practitioners) who all work together to provide you with the care you need, when you need it.  Your next appointment:   5 - 6  month(s)  Provider:    Deatrice Cage, MD or Barnie Hila, NP    We recommend signing up for the patient portal called MyChart.  Sign up information is provided on this After Visit Summary.  MyChart is used to connect with patients for Virtual Visits (Telemedicine).  Patients are able to view lab/test results, encounter notes, upcoming appointments, etc.  Non-urgent messages can be sent to your provider as well.   To learn more about what you can do with MyChart, go to [email or web address].

## 2024-12-11 ENCOUNTER — Ambulatory Visit: Payer: Self-pay | Admitting: Student

## 2024-12-11 LAB — BASIC METABOLIC PANEL WITH GFR
BUN/Creatinine Ratio: 19 (ref 12–28)
BUN: 17 mg/dL (ref 8–27)
CO2: 23 mmol/L (ref 20–29)
Calcium: 9.9 mg/dL (ref 8.7–10.3)
Chloride: 105 mmol/L (ref 96–106)
Creatinine, Ser: 0.89 mg/dL (ref 0.57–1.00)
Glucose: 97 mg/dL (ref 70–99)
Potassium: 4.9 mmol/L (ref 3.5–5.2)
Sodium: 141 mmol/L (ref 134–144)
eGFR: 70 mL/min/1.73

## 2024-12-13 NOTE — Progress Notes (Signed)
 Last read by Verneta Dean at 8:45AM on 12/13/2024.

## 2024-12-14 ENCOUNTER — Telehealth: Payer: Self-pay | Admitting: Emergency Medicine

## 2024-12-14 NOTE — Telephone Encounter (Signed)
 Called and spoke with the patient and her daughter to give them the number for Centralized Scheduling, so the patient can call and get herself scheduled for CT Angio of Chest for PE.  Patient and daughter provided with the phone number, as well as the patient's MRN number to expedite the scheduling.  Both verbalized understanding and expressed gratitude for the call.

## 2024-12-16 ENCOUNTER — Other Ambulatory Visit: Payer: Self-pay | Admitting: Student

## 2024-12-16 ENCOUNTER — Ambulatory Visit: Payer: Self-pay | Admitting: Dermatology

## 2024-12-17 ENCOUNTER — Ambulatory Visit
Admission: RE | Admit: 2024-12-17 | Discharge: 2024-12-17 | Disposition: A | Discharge status: Home / Self Care | Source: Ambulatory Visit | Attending: Student | Admitting: Student

## 2024-12-17 DIAGNOSIS — R0609 Other forms of dyspnea: Secondary | ICD-10-CM | POA: Diagnosis present

## 2024-12-17 MED ORDER — IOHEXOL 350 MG/ML SOLN
75.0000 mL | Freq: Once | INTRAVENOUS | Status: AC | PRN
  Administered 2024-12-17: 75 mL via INTRAVENOUS

## 2024-12-21 NOTE — Progress Notes (Signed)
 Last read by Verneta Dean at 10:29AM on 12/18/2024.

## 2024-12-31 ENCOUNTER — Encounter: Payer: Self-pay | Admitting: Nurse Practitioner

## 2024-12-31 ENCOUNTER — Ambulatory Visit (INDEPENDENT_AMBULATORY_CARE_PROVIDER_SITE_OTHER): Admitting: Nurse Practitioner

## 2024-12-31 VITALS — BP 122/80 | HR 69 | Temp 97.9°F | Ht 62.0 in | Wt 215.4 lb

## 2024-12-31 DIAGNOSIS — Z6839 Body mass index (BMI) 39.0-39.9, adult: Secondary | ICD-10-CM

## 2024-12-31 DIAGNOSIS — R0609 Other forms of dyspnea: Secondary | ICD-10-CM

## 2024-12-31 DIAGNOSIS — N62 Hypertrophy of breast: Secondary | ICD-10-CM | POA: Diagnosis not present

## 2024-12-31 DIAGNOSIS — E66812 Obesity, class 2: Secondary | ICD-10-CM

## 2024-12-31 DIAGNOSIS — G8929 Other chronic pain: Secondary | ICD-10-CM | POA: Diagnosis not present

## 2024-12-31 DIAGNOSIS — M5442 Lumbago with sciatica, left side: Secondary | ICD-10-CM | POA: Diagnosis not present

## 2024-12-31 MED ORDER — SEMAGLUTIDE-WEIGHT MANAGEMENT 1.5 MG PO TABS: 1.5000 mg | ORAL_TABLET | Freq: Every day | ORAL | 0 refills | Status: AC

## 2024-12-31 MED ORDER — SEMAGLUTIDE-WEIGHT MANAGEMENT 4 MG PO TABS: 4.0000 mg | ORAL_TABLET | Freq: Every day | ORAL | 2 refills | Status: AC

## 2024-12-31 NOTE — Assessment & Plan Note (Signed)
 Chronic back pain is exacerbated by macromastia and obesity. The potential benefits of breast reduction surgery were discussed, and she is hesitant but open to considering it after weight loss. A referral to plastic surgery for consultation regarding breast reduction is made. Weight loss is encouraged to potentially alleviate back pain.

## 2024-12-31 NOTE — Progress Notes (Signed)
 " Leron Glance, NP-C Phone: [phone]  Discussed the use of AI scribe software for clinical note transcription with the patient, who gave verbal consent to proceed.  History of Present Illness   Cindy Stein is a 69 year old female with rheumatoid arthritis who presents for weight management and medication discussion. She is accompanied by her daughter, Kyra.  She is seeking assistance with weight management and is inquiring about the medication Wegovy . She has previously explored weight loss options, but insurance coverage was a barrier. She is now considering a new pill form of Wegovy , which costs $140 per month.  She has difficulty losing weight due to the use of steroids for her rheumatoid arthritis, which she believes contributes to her weight gain and facial swelling. Despite eating healthily by cutting out breads and pastas, she struggles with weight loss. She experiences shortness of breath with minimal exertion, such as vacuuming, which she attributes to her weight and deconditioning. A recent CT scan was performed to rule out a blood clot, which was normal.  She has a history of rheumatoid arthritis and has been on steroids for management. She experiences significant back pain, which she attributes to her large breast size and has considered breast reduction surgery. She has undergone knee replacements and bunion surgery in the past. She also reports sciatic nerve pain and finds relief from chiropractic care.  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 She experiences difficulty with physical activity due to back pain and shortness of breath. She is considering increasing her physical activity as the weather improves.  Her current medications include vitamin D , calcium, and potassium supplements.     Tobacco Use History[1]  Medications Ordered Prior to Encounter[2]   ROS see history of present illness  Objective  Physical Exam Vitals:    12/31/24 0936  BP: 122/80  Pulse: 69  Temp: 97.9 F (36.6 C)  SpO2: 96%    BP Readings from Last 3 Encounters:  12/31/24 122/80  12/10/24 110/72  10/21/24 132/80   Wt Readings from Last 3 Encounters:  12/31/24 215 lb 6.4 oz (97.7 kg)  12/10/24 213 lb 12.8 oz (97 kg)  10/21/24 211 lb 6.4 oz (95.9 kg)    Physical Exam Constitutional:      General: She is not in acute distress.    Appearance: Normal appearance. She is obese.  HENT:     Head: Normocephalic.  Cardiovascular:     Rate and Rhythm: Normal rate and regular rhythm.     Heart sounds: Normal heart sounds.  Pulmonary:     Effort: Pulmonary effort is normal.     Breath sounds: Normal breath sounds.  Skin:    General: Skin is warm and dry.  Neurological:     General: No focal deficit present.     Mental Status: She is alert.  Psychiatric:        Mood and Affect: Mood normal.        Behavior: Behavior normal.      Assessment/Plan: Please see individual problem list.  Class 2 severe obesity due to excess calories with serious comorbidity and body mass index (BMI) of 39.0 to 39.9 in adult Assessment & Plan: Her obesity contributes to shortness of breath and back pain, with steroid use for RA complicating weight loss. Wegovy  is prescribed to aid weight loss, starting at 1.5 mg daily for 30 days, then increasing to 4 mg daily. She should take Wegovy  on an empty stomach  with water and wait 30 minutes before eating or taking other medications. Monitoring for nausea is important, and she should report if it becomes severe. Maintaining protein intake is encouraged to prevent muscle loss, and increasing exercise is advised as the weather improves. Counseled on the risk of pancreatitis and gallbladder disease. Discussed the risk of nausea. I discussed that medullary thyroid  cancer has been seen in rats studies. The patient confirmed no personal or family history of thyroid  cancer, parathyroid cancer, or adrenal gland cancer.  Discussed that we thus far have not seen medullary thyroid  cancer result from use of this type of medication in humans. Advised to monitor the thyroid  area and contact us  for any lumps, swelling, trouble swallowing, or any other changes in this area.  Discussed goal weight loss of 1 to 2 pounds a week while on this medication. Discussed the importance of healthy diet, exercise and lifestyle modifications even with this medication. We will continue to monitor, follow up in 6 weeks.   Orders: -     Semaglutide -Weight Management; Take 1 tablet (1.5 mg total) by mouth daily. Daily in AM on an empty stomach with 4 oz of water. Do not eat or drink for 30 minutes after dose.  Dispense: 30 tablet; Refill: 0 -     Semaglutide -Weight Management; Take 1 tablet (4 mg total) by mouth daily. Daily in morning on an empty stomach with 4 oz of water. Do not eat or drink for 30 minutes after dose. Start after completing 30 days of 1.5 mg tablet.  Dispense: 30 tablet; Refill: 2  Symptomatic mammary hypertrophy Assessment & Plan: Chronic back pain is exacerbated by macromastia and obesity. The potential benefits of breast reduction surgery were discussed, and she is hesitant but open to considering it after weight loss. A referral to plastic surgery for consultation regarding breast reduction is made. Weight loss is encouraged to potentially alleviate back pain.  Orders: -     Ambulatory referral to Plastic Surgery  Chronic bilateral low back pain with left-sided sciatica  DOE (dyspnea on exertion) Assessment & Plan: Recent evaluation by Cardiology. Echo and CTA chest WNL. No edema, orthopnea, chest pain or dizziness. BP well controlled. Likely due to deconditioning and obesity. Weight loss and increased physical activity are encouraged to improve symptoms, with advice to monitor symptoms and adjust activity levels as needed. Referral placed to Pulmonology for further evaluation.       Return in about 6 weeks  (around 02/11/2025) for Follow up.   Leron Glance, NP-C  Primary Care - [place]     [1]  Social History Tobacco Use  Smoking Status Never  Smokeless Tobacco Never  [2]  Current Outpatient Medications on File Prior to Visit  Medication Sig Dispense Refill   apixaban  (ELIQUIS ) 5 MG TABS tablet Take 1 tablet (5 mg total) by mouth 2 (two) times daily. 180 tablet 3   carvedilol  (COREG ) 3.125 MG tablet TAKE 1 TABLET (3.125 MG TOTAL) BY MOUTH 2 (TWO) TIMES DAILY. 180 tablet 3   Crisaborole  (EUCRISA ) 2 % OINT Apply to hands BID PRN. 60 g 1   cyclobenzaprine  (FLEXERIL ) 5 MG tablet Take 1 tablet (5 mg total) by mouth at bedtime as needed. 30 tablet 2   etanercept (ENBREL) 50 MG/ML injection Inject into the skin.     folic acid  (FOLVITE ) 1 MG tablet Take 1 mg by mouth daily.     hydroxychloroquine (PLAQUENIL) 200 MG tablet TAKE ONE TABLET (200 MG TOTAL) BY MOUTH TWO  TIMES DAILY     minoxidil  (LONITEN ) 2.5 MG tablet TAKE ONE HALF (1/2) TAB BY MOUTH EVERY DAY 135 tablet 1   naproxen sodium (ALEVE) 220 MG tablet Take 220 mg by mouth.     olmesartan  (BENICAR ) 20 MG tablet Take 1 tablet (20 mg total) by mouth daily. 90 tablet 3   ondansetron  (ZOFRAN -ODT) 4 MG disintegrating tablet TAKE ONE TABLET (4 MG TOTAL) BY MOUTH EVERY EIGHT HOURS AS NEEDED FOR NAUSEA OR VOMITING. 15 tablet 0   sertraline  (ZOLOFT ) 50 MG tablet Take 1 tablet (50 mg total) by mouth daily. 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   "

## 2024-12-31 NOTE — Assessment & Plan Note (Signed)
 Recent evaluation by Cardiology. Echo and CTA chest WNL. No edema, orthopnea, chest pain or dizziness. BP well controlled. Likely due to deconditioning and obesity. Weight loss and increased physical activity are encouraged to improve symptoms, with advice to monitor symptoms and adjust activity levels as needed. Referral placed to Pulmonology for further evaluation.

## 2024-12-31 NOTE — Assessment & Plan Note (Addendum)
 Her obesity contributes to shortness of breath and back pain, with steroid use for RA complicating weight loss. Wegovy  is prescribed to aid weight loss, starting at 1.5 mg daily for 30 days, then increasing to 4 mg daily. She should take Wegovy  on an empty stomach with water and wait 30 minutes before eating or taking other medications. Monitoring for nausea is important, and she should report if it becomes severe. Maintaining protein intake is encouraged to prevent muscle loss, and increasing exercise is advised as the weather improves. Counseled on the risk of pancreatitis and gallbladder disease. Discussed the risk of nausea. I discussed that medullary thyroid  cancer has been seen in rats studies. The patient confirmed no personal or family history of thyroid  cancer, parathyroid cancer, or adrenal gland cancer. Discussed that we thus far have not seen medullary thyroid  cancer result from use of this type of medication in humans. Advised to monitor the thyroid  area and contact us  for any lumps, swelling, trouble swallowing, or any other changes in this area.  Discussed goal weight loss of 1 to 2 pounds a week while on this medication. Discussed the importance of healthy diet, exercise and lifestyle modifications even with this medication. We will continue to monitor, follow up in 6 weeks.

## 2025-01-04 ENCOUNTER — Telehealth: Payer: Self-pay

## 2025-01-05 ENCOUNTER — Ambulatory Visit

## 2025-01-06 ENCOUNTER — Telehealth: Payer: Self-pay

## 2025-01-06 ENCOUNTER — Other Ambulatory Visit: Payer: Self-pay | Admitting: Nurse Practitioner

## 2025-01-06 ENCOUNTER — Ambulatory Visit: Admitting: Pulmonary Disease

## 2025-01-06 DIAGNOSIS — M2012 Hallux valgus (acquired), left foot: Secondary | ICD-10-CM

## 2025-01-06 DIAGNOSIS — M2022 Hallux rigidus, left foot: Secondary | ICD-10-CM

## 2025-01-06 NOTE — Telephone Encounter (Signed)
 Cindy Stein was made aware of the new referral per requested has been placed from provider. Verbalized understanding.

## 2025-01-06 NOTE — Telephone Encounter (Signed)
 Copied from CRM #[id number]. Topic: Referral - Question >> Jan 06, 2025  9:55 AM Revonda D wrote: Reason for CRM: Melissa with [facility] needs a referral for Cindy Stein to come to them because [employer] requires referral. DX Code will be M20.12; M20.22 and Dr. Parke will the doctor. Please call Melissa at [phone] with questions; Fax [phone].  UPDATE: Melissa with [facility] is calling to get an update on this referral request. She stated that the pt will be scheduled for surgery soon and needs to have this referral submitted. Melissa would like a callback with an update today is possible. CB: [phone]

## 2025-01-07 NOTE — Telephone Encounter (Signed)
 Noted.

## 2025-01-20 ENCOUNTER — Ambulatory Visit: Payer: Self-pay | Admitting: Dermatology

## 2025-02-15 ENCOUNTER — Ambulatory Visit: Admitting: Nurse Practitioner

## 2025-05-26 ENCOUNTER — Ambulatory Visit: Admitting: Student
